# Patient Record
Sex: Female | Born: 1935 | Race: Black or African American | Hispanic: No | Smoking: Current some day smoker
Health system: Southern US, Community
[De-identification: ages and names within clinical notes are randomized; demographics above are authoritative.]

## PROBLEM LIST (undated history)

## (undated) DIAGNOSIS — R002 Palpitations: Secondary | ICD-10-CM

## (undated) DIAGNOSIS — I209 Angina pectoris, unspecified: Secondary | ICD-10-CM

## (undated) DIAGNOSIS — I2510 Atherosclerotic heart disease of native coronary artery without angina pectoris: Secondary | ICD-10-CM

## (undated) DIAGNOSIS — I10 Essential (primary) hypertension: Secondary | ICD-10-CM

## (undated) DIAGNOSIS — Z972 Presence of dental prosthetic device (complete) (partial): Secondary | ICD-10-CM

## (undated) DIAGNOSIS — R0600 Dyspnea, unspecified: Secondary | ICD-10-CM

## (undated) DIAGNOSIS — H269 Unspecified cataract: Secondary | ICD-10-CM

## (undated) DIAGNOSIS — Z973 Presence of spectacles and contact lenses: Secondary | ICD-10-CM

## (undated) DIAGNOSIS — R918 Other nonspecific abnormal finding of lung field: Secondary | ICD-10-CM

## (undated) DIAGNOSIS — C3490 Malignant neoplasm of unspecified part of unspecified bronchus or lung: Secondary | ICD-10-CM

## (undated) DIAGNOSIS — K219 Gastro-esophageal reflux disease without esophagitis: Secondary | ICD-10-CM

## (undated) DIAGNOSIS — J189 Pneumonia, unspecified organism: Secondary | ICD-10-CM

## (undated) DIAGNOSIS — I639 Cerebral infarction, unspecified: Secondary | ICD-10-CM

## (undated) DIAGNOSIS — J45909 Unspecified asthma, uncomplicated: Secondary | ICD-10-CM

## (undated) DIAGNOSIS — M1990 Unspecified osteoarthritis, unspecified site: Secondary | ICD-10-CM

## (undated) HISTORY — DX: Cerebral infarction, unspecified: I63.9

## (undated) HISTORY — PX: COLONOSCOPY W/ BIOPSIES AND POLYPECTOMY: SHX1376

## (undated) HISTORY — PX: MULTIPLE TOOTH EXTRACTIONS: SHX2053

## (undated) HISTORY — PX: ABDOMINAL HYSTERECTOMY: SHX81

## (undated) HISTORY — PX: ANKLE SURGERY: SHX546

## (undated) HISTORY — PX: TOTAL KNEE ARTHROPLASTY: SHX125

---

## 1997-12-27 ENCOUNTER — Encounter: Admission: RE | Admit: 1997-12-27 | Discharge: 1998-03-27 | Payer: Self-pay | Admitting: Orthopedic Surgery

## 1997-12-31 ENCOUNTER — Encounter (HOSPITAL_COMMUNITY): Admission: RE | Admit: 1997-12-31 | Discharge: 1998-03-31 | Payer: Self-pay | Admitting: Orthopedic Surgery

## 1998-04-02 ENCOUNTER — Encounter: Admission: RE | Admit: 1998-04-02 | Discharge: 1998-07-01 | Payer: Self-pay | Admitting: Orthopedic Surgery

## 1998-08-25 ENCOUNTER — Encounter: Payer: Self-pay | Admitting: Internal Medicine

## 1998-08-25 ENCOUNTER — Ambulatory Visit (HOSPITAL_COMMUNITY): Admission: RE | Admit: 1998-08-25 | Discharge: 1998-08-25 | Payer: Self-pay | Admitting: Internal Medicine

## 1999-06-08 ENCOUNTER — Inpatient Hospital Stay (HOSPITAL_COMMUNITY): Admission: AD | Admit: 1999-06-08 | Discharge: 1999-06-12 | Payer: Self-pay | Admitting: Cardiovascular Disease

## 1999-06-09 ENCOUNTER — Encounter: Payer: Self-pay | Admitting: Cardiovascular Disease

## 1999-06-10 ENCOUNTER — Encounter: Payer: Self-pay | Admitting: Cardiovascular Disease

## 1999-06-11 ENCOUNTER — Encounter: Payer: Self-pay | Admitting: Cardiovascular Disease

## 2000-01-05 ENCOUNTER — Other Ambulatory Visit: Admission: RE | Admit: 2000-01-05 | Discharge: 2000-01-05 | Payer: Self-pay | Admitting: Obstetrics and Gynecology

## 2000-01-15 ENCOUNTER — Encounter: Payer: Self-pay | Admitting: Internal Medicine

## 2000-01-15 ENCOUNTER — Ambulatory Visit (HOSPITAL_COMMUNITY): Admission: RE | Admit: 2000-01-15 | Discharge: 2000-01-15 | Payer: Self-pay | Admitting: Internal Medicine

## 2000-03-15 ENCOUNTER — Ambulatory Visit (HOSPITAL_COMMUNITY): Admission: RE | Admit: 2000-03-15 | Discharge: 2000-03-15 | Payer: Self-pay | Admitting: Gastroenterology

## 2000-03-15 ENCOUNTER — Encounter: Payer: Self-pay | Admitting: Gastroenterology

## 2000-04-30 ENCOUNTER — Encounter: Payer: Self-pay | Admitting: Emergency Medicine

## 2000-05-01 ENCOUNTER — Inpatient Hospital Stay (HOSPITAL_COMMUNITY): Admission: EM | Admit: 2000-05-01 | Discharge: 2000-05-03 | Payer: Self-pay | Admitting: Emergency Medicine

## 2000-05-02 ENCOUNTER — Encounter: Payer: Self-pay | Admitting: Cardiology

## 2000-08-30 ENCOUNTER — Inpatient Hospital Stay (HOSPITAL_COMMUNITY): Admission: AD | Admit: 2000-08-30 | Discharge: 2000-09-05 | Payer: Self-pay | Admitting: Internal Medicine

## 2000-08-30 ENCOUNTER — Encounter: Payer: Self-pay | Admitting: Internal Medicine

## 2000-12-19 ENCOUNTER — Encounter (INDEPENDENT_AMBULATORY_CARE_PROVIDER_SITE_OTHER): Payer: Self-pay | Admitting: Specialist

## 2000-12-19 ENCOUNTER — Ambulatory Visit (HOSPITAL_COMMUNITY): Admission: RE | Admit: 2000-12-19 | Discharge: 2000-12-19 | Payer: Self-pay | Admitting: Gastroenterology

## 2001-02-03 ENCOUNTER — Emergency Department (HOSPITAL_COMMUNITY): Admission: EM | Admit: 2001-02-03 | Discharge: 2001-02-03 | Payer: Self-pay | Admitting: Emergency Medicine

## 2001-02-03 ENCOUNTER — Encounter: Payer: Self-pay | Admitting: Emergency Medicine

## 2001-03-24 ENCOUNTER — Encounter: Payer: Self-pay | Admitting: Cardiovascular Disease

## 2001-03-24 ENCOUNTER — Encounter: Admission: RE | Admit: 2001-03-24 | Discharge: 2001-03-24 | Payer: Self-pay | Admitting: Cardiovascular Disease

## 2002-07-02 ENCOUNTER — Encounter: Payer: Self-pay | Admitting: Nephrology

## 2002-07-02 ENCOUNTER — Encounter: Admission: RE | Admit: 2002-07-02 | Discharge: 2002-07-02 | Payer: Self-pay | Admitting: Nephrology

## 2002-07-30 ENCOUNTER — Encounter: Admission: RE | Admit: 2002-07-30 | Discharge: 2002-10-28 | Payer: Self-pay | Admitting: Internal Medicine

## 2002-11-18 ENCOUNTER — Emergency Department (HOSPITAL_COMMUNITY): Admission: EM | Admit: 2002-11-18 | Discharge: 2002-11-19 | Payer: Self-pay | Admitting: Emergency Medicine

## 2002-12-13 ENCOUNTER — Ambulatory Visit (HOSPITAL_COMMUNITY): Admission: RE | Admit: 2002-12-13 | Discharge: 2002-12-13 | Payer: Self-pay | Admitting: Cardiovascular Disease

## 2002-12-13 ENCOUNTER — Encounter: Payer: Self-pay | Admitting: Cardiovascular Disease

## 2002-12-17 ENCOUNTER — Encounter: Admission: RE | Admit: 2002-12-17 | Discharge: 2003-03-17 | Payer: Self-pay | Admitting: Internal Medicine

## 2003-03-26 ENCOUNTER — Encounter: Admission: RE | Admit: 2003-03-26 | Discharge: 2003-06-24 | Payer: Self-pay | Admitting: Internal Medicine

## 2003-04-24 ENCOUNTER — Ambulatory Visit (HOSPITAL_COMMUNITY): Admission: RE | Admit: 2003-04-24 | Discharge: 2003-04-24 | Payer: Self-pay | Admitting: Cardiovascular Disease

## 2003-04-24 ENCOUNTER — Encounter: Payer: Self-pay | Admitting: Cardiovascular Disease

## 2003-06-10 ENCOUNTER — Ambulatory Visit (HOSPITAL_COMMUNITY): Admission: RE | Admit: 2003-06-10 | Discharge: 2003-06-10 | Payer: Self-pay | Admitting: Cardiovascular Disease

## 2003-10-15 ENCOUNTER — Ambulatory Visit (HOSPITAL_COMMUNITY): Admission: RE | Admit: 2003-10-15 | Discharge: 2003-10-15 | Payer: Self-pay | Admitting: Cardiovascular Disease

## 2005-04-05 ENCOUNTER — Inpatient Hospital Stay (HOSPITAL_COMMUNITY): Admission: EM | Admit: 2005-04-05 | Discharge: 2005-04-09 | Payer: Self-pay | Admitting: *Deleted

## 2005-05-19 ENCOUNTER — Ambulatory Visit (HOSPITAL_COMMUNITY): Admission: RE | Admit: 2005-05-19 | Discharge: 2005-05-19 | Payer: Self-pay | Admitting: Cardiovascular Disease

## 2005-05-31 ENCOUNTER — Ambulatory Visit (HOSPITAL_COMMUNITY): Admission: RE | Admit: 2005-05-31 | Discharge: 2005-05-31 | Payer: Self-pay | Admitting: Orthopaedic Surgery

## 2005-07-06 ENCOUNTER — Emergency Department (HOSPITAL_COMMUNITY): Admission: EM | Admit: 2005-07-06 | Discharge: 2005-07-06 | Payer: Self-pay | Admitting: Emergency Medicine

## 2005-07-08 ENCOUNTER — Emergency Department (HOSPITAL_COMMUNITY): Admission: EM | Admit: 2005-07-08 | Discharge: 2005-07-08 | Payer: Self-pay | Admitting: Emergency Medicine

## 2005-09-14 ENCOUNTER — Encounter: Admission: RE | Admit: 2005-09-14 | Discharge: 2005-09-14 | Payer: Self-pay | Admitting: Orthopedic Surgery

## 2005-10-29 ENCOUNTER — Ambulatory Visit (HOSPITAL_COMMUNITY): Admission: RE | Admit: 2005-10-29 | Discharge: 2005-10-29 | Payer: Self-pay | Admitting: Cardiovascular Disease

## 2006-03-27 ENCOUNTER — Emergency Department (HOSPITAL_COMMUNITY): Admission: EM | Admit: 2006-03-27 | Discharge: 2006-03-27 | Payer: Self-pay | Admitting: Family Medicine

## 2006-06-13 ENCOUNTER — Encounter: Admission: RE | Admit: 2006-06-13 | Discharge: 2006-06-13 | Payer: Self-pay | Admitting: Internal Medicine

## 2006-07-07 ENCOUNTER — Ambulatory Visit (HOSPITAL_COMMUNITY): Admission: RE | Admit: 2006-07-07 | Discharge: 2006-07-07 | Payer: Self-pay | Admitting: Cardiology

## 2006-07-12 ENCOUNTER — Inpatient Hospital Stay (HOSPITAL_COMMUNITY): Admission: EM | Admit: 2006-07-12 | Discharge: 2006-07-13 | Payer: Self-pay | Admitting: Cardiovascular Disease

## 2006-09-29 ENCOUNTER — Ambulatory Visit (HOSPITAL_COMMUNITY): Admission: RE | Admit: 2006-09-29 | Discharge: 2006-09-29 | Payer: Self-pay | Admitting: Cardiovascular Disease

## 2006-12-19 ENCOUNTER — Emergency Department (HOSPITAL_COMMUNITY): Admission: EM | Admit: 2006-12-19 | Discharge: 2006-12-19 | Payer: Self-pay | Admitting: Emergency Medicine

## 2007-03-09 ENCOUNTER — Inpatient Hospital Stay (HOSPITAL_COMMUNITY): Admission: EM | Admit: 2007-03-09 | Discharge: 2007-03-12 | Payer: Self-pay | Admitting: Emergency Medicine

## 2007-04-25 ENCOUNTER — Encounter: Admission: RE | Admit: 2007-04-25 | Discharge: 2007-04-25 | Payer: Self-pay | Admitting: Gastroenterology

## 2007-11-05 ENCOUNTER — Emergency Department (HOSPITAL_COMMUNITY): Admission: EM | Admit: 2007-11-05 | Discharge: 2007-11-05 | Payer: Self-pay | Admitting: Emergency Medicine

## 2007-11-22 ENCOUNTER — Encounter: Admission: RE | Admit: 2007-11-22 | Discharge: 2007-11-22 | Payer: Self-pay | Admitting: Internal Medicine

## 2008-04-26 ENCOUNTER — Encounter: Admission: RE | Admit: 2008-04-26 | Discharge: 2008-07-01 | Payer: Self-pay | Admitting: Gastroenterology

## 2008-06-17 ENCOUNTER — Emergency Department (HOSPITAL_COMMUNITY): Admission: EM | Admit: 2008-06-17 | Discharge: 2008-06-17 | Payer: Self-pay | Admitting: Family Medicine

## 2008-09-18 ENCOUNTER — Inpatient Hospital Stay (HOSPITAL_COMMUNITY): Admission: EM | Admit: 2008-09-18 | Discharge: 2008-09-21 | Payer: Self-pay | Admitting: Emergency Medicine

## 2008-09-26 ENCOUNTER — Inpatient Hospital Stay (HOSPITAL_COMMUNITY): Admission: EM | Admit: 2008-09-26 | Discharge: 2008-09-29 | Payer: Self-pay | Admitting: Emergency Medicine

## 2009-02-26 ENCOUNTER — Ambulatory Visit (HOSPITAL_COMMUNITY): Admission: RE | Admit: 2009-02-26 | Discharge: 2009-02-26 | Payer: Self-pay

## 2009-05-07 ENCOUNTER — Emergency Department (HOSPITAL_COMMUNITY): Admission: EM | Admit: 2009-05-07 | Discharge: 2009-05-07 | Payer: Self-pay | Admitting: Emergency Medicine

## 2009-05-12 ENCOUNTER — Emergency Department (HOSPITAL_COMMUNITY): Admission: EM | Admit: 2009-05-12 | Discharge: 2009-05-12 | Payer: Self-pay | Admitting: Family Medicine

## 2010-05-02 ENCOUNTER — Emergency Department (HOSPITAL_COMMUNITY)
Admission: EM | Admit: 2010-05-02 | Discharge: 2010-05-02 | Payer: Self-pay | Source: Home / Self Care | Admitting: Family Medicine

## 2010-08-07 ENCOUNTER — Emergency Department (HOSPITAL_COMMUNITY)
Admission: EM | Admit: 2010-08-07 | Discharge: 2010-08-07 | Payer: Self-pay | Source: Home / Self Care | Admitting: Family Medicine

## 2010-08-24 ENCOUNTER — Encounter: Payer: Self-pay | Admitting: Gastroenterology

## 2010-09-02 ENCOUNTER — Other Ambulatory Visit: Payer: Self-pay | Admitting: Internal Medicine

## 2010-09-02 ENCOUNTER — Ambulatory Visit
Admission: RE | Admit: 2010-09-02 | Discharge: 2010-09-02 | Disposition: A | Discharge status: Home / Self Care | Payer: MEDICARE | Source: Ambulatory Visit | Attending: Internal Medicine | Admitting: Internal Medicine

## 2010-09-02 DIAGNOSIS — F17200 Nicotine dependence, unspecified, uncomplicated: Secondary | ICD-10-CM

## 2010-09-02 DIAGNOSIS — R05 Cough: Secondary | ICD-10-CM

## 2010-11-17 LAB — CULTURE, BLOOD (ROUTINE X 2)

## 2010-11-17 LAB — CBC
HCT: 31.4 % — ABNORMAL LOW (ref 36.0–46.0)
HCT: 35.1 % — ABNORMAL LOW (ref 36.0–46.0)
HCT: 41.5 % (ref 36.0–46.0)
HCT: 42.4 % (ref 36.0–46.0)
Hemoglobin: 11.4 g/dL — ABNORMAL LOW (ref 12.0–15.0)
Hemoglobin: 11.9 g/dL — ABNORMAL LOW (ref 12.0–15.0)
Hemoglobin: 13.8 g/dL (ref 12.0–15.0)
Hemoglobin: 13.9 g/dL (ref 12.0–15.0)
MCHC: 32.8 g/dL (ref 30.0–36.0)
MCHC: 33.3 g/dL (ref 30.0–36.0)
MCHC: 33.5 g/dL (ref 30.0–36.0)
MCHC: 33.8 g/dL (ref 30.0–36.0)
MCHC: 34.1 g/dL (ref 30.0–36.0)
MCV: 82.2 fL (ref 78.0–100.0)
MCV: 82.9 fL (ref 78.0–100.0)
Platelets: 216 10*3/uL (ref 150–400)
Platelets: 259 10*3/uL (ref 150–400)
Platelets: 340 10*3/uL (ref 150–400)
RBC: 4.18 MIL/uL (ref 3.87–5.11)
RBC: 4.33 MIL/uL (ref 3.87–5.11)
RBC: 5.05 MIL/uL (ref 3.87–5.11)
RDW: 14.3 % (ref 11.5–15.5)
RDW: 14.4 % (ref 11.5–15.5)
RDW: 14.5 % (ref 11.5–15.5)
WBC: 14.6 10*3/uL — ABNORMAL HIGH (ref 4.0–10.5)
WBC: 5 10*3/uL (ref 4.0–10.5)
WBC: 6.3 10*3/uL (ref 4.0–10.5)

## 2010-11-17 LAB — URINALYSIS, ROUTINE W REFLEX MICROSCOPIC
Glucose, UA: NEGATIVE mg/dL
Hgb urine dipstick: NEGATIVE
Ketones, ur: NEGATIVE mg/dL
Nitrite: POSITIVE — AB
Protein, ur: 30 mg/dL — AB
Specific Gravity, Urine: 1.023 (ref 1.005–1.030)
Specific Gravity, Urine: 1.025 (ref 1.005–1.030)
Urobilinogen, UA: 0.2 mg/dL (ref 0.0–1.0)
Urobilinogen, UA: 0.2 mg/dL (ref 0.0–1.0)
pH: 5 (ref 5.0–8.0)

## 2010-11-17 LAB — BLOOD GAS, ARTERIAL
Acid-base deficit: 4.1 mmol/L — ABNORMAL HIGH (ref 0.0–2.0)
Drawn by: 23588
FIO2: 0.21 %
pCO2 arterial: 33.1 mmHg — ABNORMAL LOW (ref 35.0–45.0)
pH, Arterial: 7.396 (ref 7.350–7.400)
pO2, Arterial: 59.5 mmHg — ABNORMAL LOW (ref 80.0–100.0)

## 2010-11-17 LAB — COMPREHENSIVE METABOLIC PANEL
ALT: 10 U/L (ref 0–35)
AST: 9 U/L (ref 0–37)
Albumin: 2.9 g/dL — ABNORMAL LOW (ref 3.5–5.2)
Albumin: 3.1 g/dL — ABNORMAL LOW (ref 3.5–5.2)
Albumin: 4 g/dL (ref 3.5–5.2)
Alkaline Phosphatase: 42 U/L (ref 39–117)
Alkaline Phosphatase: 56 U/L (ref 39–117)
BUN: 15 mg/dL (ref 6–23)
BUN: 16 mg/dL (ref 6–23)
BUN: 22 mg/dL (ref 6–23)
BUN: 28 mg/dL — ABNORMAL HIGH (ref 6–23)
CO2: 17 mEq/L — ABNORMAL LOW (ref 19–32)
Calcium: 9 mg/dL (ref 8.4–10.5)
Chloride: 111 mEq/L (ref 96–112)
Creatinine, Ser: 1.14 mg/dL (ref 0.4–1.2)
Creatinine, Ser: 1.29 mg/dL — ABNORMAL HIGH (ref 0.4–1.2)
Creatinine, Ser: 1.3 mg/dL — ABNORMAL HIGH (ref 0.4–1.2)
GFR calc non Af Amer: 47 mL/min — ABNORMAL LOW (ref >60)
Glucose, Bld: 163 mg/dL — ABNORMAL HIGH (ref 70–99)
Glucose, Bld: 207 mg/dL — ABNORMAL HIGH (ref 70–99)
Glucose, Bld: 88 mg/dL (ref 70–99)
Potassium: 4 mEq/L (ref 3.5–5.1)
Sodium: 138 mEq/L (ref 135–145)
Total Bilirubin: 0.3 mg/dL (ref 0.3–1.2)
Total Bilirubin: 0.5 mg/dL (ref 0.3–1.2)
Total Bilirubin: 0.5 mg/dL (ref 0.3–1.2)
Total Protein: 5.6 g/dL — ABNORMAL LOW (ref 6.0–8.3)
Total Protein: 5.9 g/dL — ABNORMAL LOW (ref 6.0–8.3)
Total Protein: 7.4 g/dL (ref 6.0–8.3)

## 2010-11-17 LAB — URINE CULTURE

## 2010-11-17 LAB — BASIC METABOLIC PANEL
BUN: 8 mg/dL (ref 6–23)
CO2: 19 mEq/L (ref 19–32)
CO2: 20 mEq/L (ref 19–32)
CO2: 20 mEq/L (ref 19–32)
Calcium: 7.7 mg/dL — ABNORMAL LOW (ref 8.4–10.5)
Calcium: 8.2 mg/dL — ABNORMAL LOW (ref 8.4–10.5)
Creatinine, Ser: 0.86 mg/dL (ref 0.4–1.2)
Creatinine, Ser: 1.11 mg/dL (ref 0.4–1.2)
GFR calc Af Amer: 59 mL/min — ABNORMAL LOW (ref >60)
GFR calc Af Amer: >60 mL/min (ref >60)
GFR calc non Af Amer: 48 mL/min — ABNORMAL LOW (ref >60)
GFR calc non Af Amer: >60 mL/min (ref >60)
GFR calc non Af Amer: >60 mL/min (ref >60)
Glucose, Bld: 104 mg/dL — ABNORMAL HIGH (ref 70–99)
Potassium: 4.4 mEq/L (ref 3.5–5.1)
Sodium: 137 mEq/L (ref 135–145)
Sodium: 141 mEq/L (ref 135–145)

## 2010-11-17 LAB — GLUCOSE, CAPILLARY
Glucose-Capillary: 101 mg/dL — ABNORMAL HIGH (ref 70–99)
Glucose-Capillary: 103 mg/dL — ABNORMAL HIGH (ref 70–99)
Glucose-Capillary: 107 mg/dL — ABNORMAL HIGH (ref 70–99)
Glucose-Capillary: 109 mg/dL — ABNORMAL HIGH (ref 70–99)
Glucose-Capillary: 112 mg/dL — ABNORMAL HIGH (ref 70–99)
Glucose-Capillary: 113 mg/dL — ABNORMAL HIGH (ref 70–99)
Glucose-Capillary: 119 mg/dL — ABNORMAL HIGH (ref 70–99)
Glucose-Capillary: 127 mg/dL — ABNORMAL HIGH (ref 70–99)
Glucose-Capillary: 132 mg/dL — ABNORMAL HIGH (ref 70–99)
Glucose-Capillary: 77 mg/dL (ref 70–99)
Glucose-Capillary: 91 mg/dL (ref 70–99)
Glucose-Capillary: 92 mg/dL (ref 70–99)
Glucose-Capillary: 95 mg/dL (ref 70–99)

## 2010-11-17 LAB — DIFFERENTIAL
Basophils Absolute: 0 10*3/uL (ref 0.0–0.1)
Basophils Absolute: 0 10*3/uL (ref 0.0–0.1)
Basophils Relative: 0 % (ref 0–1)
Lymphocytes Relative: 3 % — ABNORMAL LOW (ref 12–46)
Lymphocytes Relative: 8 % — ABNORMAL LOW (ref 12–46)
Lymphs Abs: 1.2 10*3/uL (ref 0.7–4.0)
Monocytes Absolute: 0.6 10*3/uL (ref 0.1–1.0)
Monocytes Relative: 4 % (ref 3–12)
Neutro Abs: 11.9 10*3/uL — ABNORMAL HIGH (ref 1.7–7.7)
Neutrophils Relative %: 91 % — ABNORMAL HIGH (ref 43–77)
Neutrophils Relative %: 91 % — ABNORMAL HIGH (ref 43–77)

## 2010-11-17 LAB — LIPASE, BLOOD
Lipase: 1006 U/L — ABNORMAL HIGH (ref 11–59)
Lipase: 27 U/L (ref 11–59)
Lipase: 78 U/L — ABNORMAL HIGH (ref 11–59)
Lipase: 83 U/L — ABNORMAL HIGH (ref 11–59)

## 2010-11-17 LAB — URINE MICROSCOPIC-ADD ON

## 2010-11-17 LAB — LIPID PANEL
HDL: 25 mg/dL — ABNORMAL LOW (ref >39)
HDL: 26 mg/dL — ABNORMAL LOW (ref >39)
Total CHOL/HDL Ratio: 5.4 RATIO
Total CHOL/HDL Ratio: 5.6 RATIO
VLDL: 25 mg/dL (ref 0–40)

## 2010-11-17 LAB — HEMOGLOBIN A1C
Hgb A1c MFr Bld: 6.9 % — ABNORMAL HIGH (ref 4.6–6.1)
Mean Plasma Glucose: 151 mg/dL

## 2010-12-15 NOTE — Discharge Summary (Signed)
NAME:  [name] [name], [name] [name]               ACCOUNT NO.:  [id number]   MEDICAL RECORD NO.:  [id number]          PATIENT TYPE:  INP   LOCATION:  5506                         FACILITY:  [facility]   PHYSICIAN:  Hillery Aldo, M.D.   DATE OF BIRTH:  02/24/1935   DATE OF ADMISSION:  03/09/2007  DATE OF DISCHARGE:  03/12/2007                               DISCHARGE SUMMARY   PRIMARY CARE PHYSICIAN:  Dr. Barbee Shropshire   DISCHARGE DIAGNOSES:  1. Recurrent acute pancreatitis, likely secondary to statin therapy or      hydrochlorothiazide.  2. Escherichia coli urinary tract infection.  3. Diabetes.  4. Hypertension.  5. Coronary artery disease.  6. Dyslipidemia.  7. Obesity.  8. Gastroesophageal reflux disease.  9. Ongoing tobacco abuse.  10.Acute renal insufficiency, resolved.   DISCHARGE MEDICATIONS:  1. AcipHex 40 mg daily.  2. Glucotrol 5 mg daily.  3. Clonidine 0.1 mg daily.  4. Glucophage 500 mg daily.  5. Aspirin 81 mg daily.  6. Cipro 250 mg b.i.d. through March 03, 2007.  7. Toprol-XL 25 mg daily.   CONSULTATIONS:  None.   PROCEDURES AND DIAGNOSTIC STUDIES:  CT scan of the abdomen and pelvis on  March 09, 2007, showed no acute abnormalities in the abdomen or the  pelvis.   DISCHARGE LABORATORY VALUES:  Sodium was 142, potassium 3.5, chloride  112, bicarb 24, BUN 13, creatinine 0.98, glucose 107.  White blood cell  count was 7.4, hemoglobin 11.6, hematocrit 34.6, platelets 272.  Lipase  was 19.   BRIEF ADMISSION HPI:  The patient is a 76 year old female who presented  to the emergency department on March 09, 2007, with continuous nausea  and intermittent vomiting.  The patient had stated that her symptoms  were similar to a previous bout of pancreatitis that she suffered  approximately 1 year prior to this admission.  It was felt that the  patient's pancreatitis at that time was due to possible adverse effect  of hydrochlorothiazide or Zocor.  The patient reports that 1 day prior  to  the onset of her symptoms, she resumed taking Zocor.  It is likely  that this is a statin-induced pancreatitis and the patient was admitted  for symptomatic treatment.   HOSPITAL COURSE BY PROBLEM:  #1 - NAUSEA AND VOMITING SECONDARY TO ACUTE  PANCREATITIS.  The patient's admission lipase was elevated at 674.  CT  scan of the abdomen and pelvis did not reveal any pseudocyst or other  abnormality.  With conservative therapy including bowel rest and  antiemetic treatment as well as pain medicines, the patient's  pancreatitis rapidly resolved.  Her lipase normalized by March 11, 2007,  and her diet was gradually advanced.  At this point, the patient is  stable for discharge home and is instructed not to take Zocor any more.   #2 - DIABETES.  The patient was initially put on sliding scale insulin  due to her n.p.o. status.  Prior to discharge, her oral hypoglycemic  medications were resumed.  She should follow up with her primary care  physician regarding ongoing management of her diabetes, which  is  suboptimally controlled given her hemoglobin A1c of 8.3.   #3 - ESCHERICHIA COLI URINARY TRACT INFECTION.  The patient was  empirically treated with Cipro.  She had a leukocytosis which resolved  over the course of her hospitalization that was likely due to her  urinary tract infection.  Sensitivity data is pending, but given her  symptomatic improvement it is likely that the Escherichia coli is Cipro  sensitive.  She will complete a total course of treatment of 5 days.   #4 - HYPERTENSION.  The patient's blood pressure was controlled with a  combination of Norvasc and Toprol-XL.  We will discharge her on Toprol-  XL and her usual dose of clonidine.  Given her past medical history of  coronary disease, beta blocker certainly would be indicated in this  patient.  Further management and surveillance regarding control of her  blood pressure can be managed as an outpatient problem.   #5 -  CORONARY ARTERY DISEASE.  The patient was started on aspirin  therapy and a beta blocker while in the hospital.  She should continue  these at discharge.   #6 - DYSLIPIDEMIA.  The patient did receive dietary counseling regarding  the importance of adhering to a low-fat diet.  A lipid profile was  checked and she had a total cholesterol of 195, triglyceride level of  110, HDL of 37, and an LDL of 136.  She is intolerant to statin therapy  and this should no longer be used in treatment of her hyperlipidemia.  Consideration for an alternative agent can be made by her outpatient  physician.   #7 - OBESITY.  The patient was counseled by the dietician regarding  weight loss efforts.   #8 - GASTROESOPHAGEAL REFLUX DISEASE.  The patient was continued on  proton pump inhibitor therapy.   #9 - TOBACCO ABUSE.  The patient was counseled on the importance of  cessation.   #10 - ACUTE RENAL INSUFFICIENCY.  The patient's admission creatinine was  elevated at 1.3.  With hydration, this declined to a value of 0.98.  The  bump in her creatinine was likely due to prerenal etiology due to her  nausea and vomiting.   DISPOSITION:  The patient is stable for discharge home.  She will be  discharged later today as long as she tolerates further advancement of  her diet.     Hillery Aldo, M.D.  Electronically Signed    CR/MEDQ  D:  03/12/2007  T:  03/13/2007  Job:  [id number]   cc:   Olene Craven, M.D.

## 2010-12-15 NOTE — H&P (Signed)
NAME:  [name] [name], [name] [name]               ACCOUNT NO.:  [id number]   MEDICAL RECORD NO.:  [id number]          PATIENT TYPE:  INP   LOCATION:  5506                         FACILITY:  [facility]   PHYSICIAN:  Beckey Rutter, MD  DATE OF BIRTH:  [birth year]   DATE OF ADMISSION:  03/09/2007  DATE OF DISCHARGE:                              HISTORY & PHYSICAL   This is Dr. are not dictated H&P for the patient 32, magnesium medical  number [id number] female.  Date of birth Oct 21, 1934.   PRIMARY CARE PHYSICIAN:  Dr. Barbee Shropshire.   CHIEF COMPLAINT:  Nausea and vomiting   HISTORY OF PRESENT ILLNESS:  This is a 76 year old female with past  medical history significant for coronary artery disease, hypertension,  diabetes type 2, obesity and history of pancreatitis presented today  with vomiting that started this afternoon and was continuously  progressive associated with nausea.  By the time the patient presented  herself to the emergency department here, she started to have diarrhea.  Also, she was complaining of mild chills.  The patient described these  symptoms as similar to when she had pancreatitis more than a year ago.  The patient stated now she is feeling better after she received an  injection for nausea.   PAST MEDICAL HISTORY:  1. Acute pancreatitis felt secondary to hydrochlorothiazide or Zocor.  2. History of urinary tract infection.  3. History of coronary artery disease.  4. Diabetes type 2.  5. Hypertension.  6. Obesity. tobacco abuse.   SOCIAL HISTORY:  The patient is a smoker but denied ethanol abuse or  illicit drug use.   FAMILY HISTORY:  Noncontributory   ALLERGIES:  CODEINE AND PENICILLIN.   MEDICATIONS:  1. Benicar.  2. Glipizide.  3. Glucotrol.  4. Lisinopril.  5. Toprol XL  6. Norvasc.   REVIEW OF SYSTEMS:  As per HPI.  VITAL SIGNS:  Temperature 98.8, blood pressure 142.76, pulse 94,  respiratory rate 20.  GENERAL:  The patient was lying flat in bed, not in  acute distress.  HEENT:  Head atraumatic, normocephalic.  Eyes: PERRL.  Mouth:  Moist.  No ulcer.  NECK:  Supple.  No JVD.  LUNGS:  Bilateral fair air entry, precordium first second heart sounds  audible.  ABDOMEN:  No tenderness.  Bowel sounds distant and sounds sluggish.  EXTREMITIES:  No lower extremity edema.   LABORATORY AND X-RAY DATA:  Microscopic urine showing many bacteria.  UA  showing cloudy urine with positive nitrate and moderate leukocyte  esterase.  White blood count 19.5, hemoglobin 13.8, hematocrit 41.7,  platelets count noted and the count is not done on this assessment.  Lipase level is 674.  Sodium 141, potassium 4.2, chloride 111, CO2 118,  glucose 117, BUN 16, creatinine 1.07.  estimated GFR more than 60.  CK-  MB less than 1.0.  The abdomen CT scan is pending.   ASSESSMENT/PLAN:  This is 76 year old with nausea and vomiting likely  secondary to pancreatitis especially with elevated lipase.  The plan is  to admit the patient to the medical floor for further  assessment and  management.  We will obtain CT scan with p.o. contrast at this time.  We  will keep the patient n.p.o. pending the CAT scan as well as the amylase  level.  Will keep the patient hydrated with gentle hydration for now.  For her diabetes, the patient will have a sliding scale without coverage  now, and I am also going to stop her oral hypoglycemic medication.  For blood pressure medication, will hold to the medication today, but  will consider starting the patient on lisinopril, Toprol XL and Norvasc  in the morning.  For GI prophylaxis, will start Protonix.  For DVT prophylaxis will  consider sequential pneumatic device for now to be switched to Lovenox  after the CT scan results.      Beckey Rutter, MD  Electronically Signed     EME/MEDQ  D:  03/09/2007  T:  03/10/2007  Job:  [id number]

## 2010-12-15 NOTE — Discharge Summary (Signed)
NAME:  [name] [name], [name] [name]               ACCOUNT NO.:  [id number]   MEDICAL RECORD NO.:  [id number]          PATIENT TYPE:  INP   LOCATION:  5504                         FACILITY:  [facility]   PHYSICIAN:  Altha Harm, MDDATE OF BIRTH:  [birth year]   DATE OF ADMISSION:  09/26/2008  DATE OF DISCHARGE:  09/29/2008                               DISCHARGE SUMMARY   ADDENDUM:   DISCHARGE DISPOSITION:  Home.  Please refer to the discharge summary  dictated by Dr. Glade Lloyd yesterday, September 28, 2008  for hospital course  up until that point.   DISCHARGE DIAGNOSES:  Please add to discharge diagnoses:  E-coli urinary tract infection fully treated.   DISCHARGE MEDICATIONS:  No additions to her discharge medications except  for Pancrease 1 tablet p.o. t.i.d. a.c. meals.   HOSPITAL COURSE:  The patient's discharge was held yesterday as she did  not have an evening meal and her tolerance to her meal could not be  adequately assessed.  This morning the patient had her breakfast and  lunch which she tolerated without any difficulty.   In terms of her urinary tract infection, the patient had been started on  ciprofloxacin on admission.  The cultures grew E-coli which was  sensitive to ciprofloxacin and the patient has completed 4 days of  antibiotics for her UTI.  She will not be discharged on any further  antibiotics for the UTI.   Otherwise the patient is stable.  The plan is to be discharged home and  follow up with Dr. Renae Gloss in 1 week.      Altha Harm, MD  Electronically Signed     MAM/MEDQ  D:  09/29/2008  T:  09/29/2008  Job:  [id number]

## 2010-12-15 NOTE — Discharge Summary (Signed)
NAME:  [name] [name], [name] [name]               ACCOUNT NO.:  [id number]   MEDICAL RECORD NO.:  [id number]          PATIENT TYPE:  INP   LOCATION:  5504                         FACILITY:  [facility]   PHYSICIAN:  Theodosia Paling, MD    DATE OF BIRTH:  1935/06/29   DATE OF ADMISSION:  09/26/2008  DATE OF DISCHARGE:                               DISCHARGE SUMMARY   PRIMARY CARE PHYSICIAN:  Merlene Laughter. Renae Gloss, MD   ADMITTING HISTORY:  Please refer to the admission note dictated by Dr.  Toniann Fail for the history of present illness for admission data.   DISCHARGE DIAGNOSES:  1. Viral gastroenteritis.  2. History of hypertension.  3. History of type 2 diabetes mellitus.   DISCHARGE MEDICATIONS:  1. Benicar 10 mg p.o. daily.  2. Glipizide 10 mg p.o. daily.  3. Toprol-XL 25 mg daily.  4. Norvasc 10 mg p.o. daily.  5. Omeprazole 40 mg p.o. q.12 h.  6. Aspirin, enteric coated 81 mg p.o. daily.  7. Lisinopril 10 mg p.o. daily.   HOSPITAL COURSE:  Following issues were addressed during the  hospitalization.  1. Nausea and vomiting.  The patient had a recent discharge from the      hospital on September 21, 2008.  She returns back with nausea and      vomiting.  She has a history of recurrent pancreatitis, obtained a      CT scan with IV contrast, which was negative for pancreatitis.  The      patient's symptoms resolved on its own.  Most likely the cause is      viral gastroenteritis.  The patient will be following up with the      primary care physician for further evaluation.  The patient does      not have a history of alcohol abuse or evidence of CT scan and her      triglycerides have been Normal in the past.  2. Hypertension.  Her hypertension stayed stable on home medication.  3. Diabetes.  History of diabetes or diabetic state, stable on home      medications.   DISPOSITION:  The patient will be following up with her primary care  physician within 1 week time.   CONSULTATION PERFORMED:   None.   PROCEDURE PERFORMED:  None.   IMAGING PERFORMED:  1. CT scan of the abdomen and pelvis with IV contrast showing no CT      findings to suggest pancreatitis.  2. Status post cholecystectomy with chronic biliary dilation.  3. Stable atherosclerotic disease involving aorta diverticulosis of      sigmoid colon.   Thank you so much for this dictation.   Total time spent in discharge of this patient around 40 minutes.      Theodosia Paling, MD  Electronically Signed     NP/MEDQ  D:  09/28/2008  T:  09/28/2008  Job:  [id number]   cc:   Merlene Laughter. Renae Gloss, M.D.

## 2010-12-15 NOTE — H&P (Signed)
NAME:  [name] [name], [name] [name]               ACCOUNT NO.:  [id number]   MEDICAL RECORD NO.:  [id number]          PATIENT TYPE:  INP   LOCATION:  1832                         FACILITY:  [facility]   PHYSICIAN:  Lonia Blood, M.D.DATE OF BIRTH:  01-Sep-1934   DATE OF ADMISSION:  09/18/2008  DATE OF DISCHARGE:                              HISTORY & PHYSICAL   PRIMARY CARE PHYSICIAN:  Dr. Kellie Shropshire.   GI PHYSICIAN:  Dr. Vida Rigger.   CHIEF COMPLAINT:  Intractable vomiting.   HISTORY OF PRESENT ILLNESS:  [name] [name] is a very pleasant 76-  year-old female with a longstanding history of recurrent pancreatitis.  The exact etiology of her pancreatitis is not clear.  It was originally  hypothesized that her pancreatitis was due to either hydrochlorothiazide  or Zocor, but she has not taken these medications for years and  continues to have recurrent bouts of acute pancreatitis.  She does not  drink alcohol.  She states that she had a bout of pancreatitis marked by  severe intractable nausea and vomiting approximately 2 weeks ago.  She  called Dr. Ewing Schlein and he called her in a prescription for nausea  medication.  She took this medication and was able to manage her  symptoms until her symptoms resolved.  Today at noon, her symptoms  recurred.  She began to experience severe unrelenting nausea with back-  to-back innumerable episodes of vomiting.  She used her anti nausea  medication but it did not help at all.  She therefore presented to the  emergency room.  She has continued to vomit in the emergency room.  There has been no hematemesis, hematochezia or melena.  There is  epigastric tenderness and pain, which is described as sharp, stabbing,  and crampy as well.  There has been no significant diarrhea.  There has  been no chest pain or shortness of breath.   REVIEW OF SYSTEMS:  The patient states that she has been in her usual  state of health until noon today.  A comprehensive review of  systems is  unremarkable with exception to the positive elements noted in the  history of present illness above.   PAST MEDICAL HISTORY:  1. Recurrent idiopathic pancreatitis.  2. Diabetes mellitus.  3. Hypertension.  4. Coronary artery disease via cardiac cath in 2007 with no focal high-      grade stenosis.  5. Hyperlipidemia.  6. Obesity.  7. Gastroesophageal reflux disease.  8. Tobacco abuse - ongoing.  9. Status post right total knee replacement in 1998.   OUTPATIENT MEDICATIONS:  1. Benicar 10 mg daily.  2. Glipizide 10 mg daily.  3. Phenergan 25 mg p.r.n.  4. Lisinopril 10 mg daily.  5. Metoprolol XL 100 mg daily.  6. Norvasc 10 mg daily.  7. Omeprazole 40 mg b.i.d.  8. Metformin 1000 mg b.i.d.  9. Aspirin 81 mg daily.   ALLERGIES:  VICODIN, CODEINE, PENICILLIN, ACTOS, AND ALTACE are all  listed as allergies but the specific reaction to each drug is not  detailed.   FAMILY HISTORY:  Noncontributory.  SOCIAL HISTORY:  The patient does not drink alcohol and she has never  been suspected of being a closet alcoholic.  She lives in [place].  She is retired.   DATA REVIEW:  White count is elevated at 14.7 with a normal hemoglobin,  normal platelet count, and normal MCV.  Sodium, potassium chloride, BUN  and creatinine are normal but bicarb is borderline low at 17 with a  serum glucose that is elevated at 163 and a normal calcium.  LFTs are  all normal.  Albumin is 4.1, lipase is 1006.  Acute abdominal series  reveals no acute disease.   PHYSICAL EXAMINATION:  VITAL SIGNS:  Temperature 97.0, blood pressure  130/67, heart rate 83, respiratory rate 16, O2 saturation 98% on room  air.  GENERAL:  Well-developed, well-nourished female in no acute respiratory  distress.  LUNGS:  Clear to auscultation bilaterally without wheezes or rhonchi.  CARDIOVASCULAR:  Regular rate and rhythm without gallop or rub.  ABDOMEN:  Tender in the epigastrium.  No rebound, no ascites and  no  appreciable mass.  No fluid wave.  Soft.  EXTREMITIES:  Trace bilateral lower extremity edema without cyanosis,  clubbing.  NEUROLOGIC:  Nonfocal neurologic exam.   IMPRESSION AND PLAN:  1. Recurrent acute idiopathic pancreatitis - We will restrict the      patient to very minimal clear liquids only for now.  If this      incites further nausea, we will make her fully n.p.o.  We will      administer antiemetics via IV on as needed basis.  We will assure      the patient receives adequate IV fluid hydration as she exhibits      significant volume loss in the ER along with recalcitrant vomiting.      If her symptoms do not improve in short course, we will add further      antiemetics.  If her course does is not one of improvement over the      next 24 hours, we will need to consider further evaluation with      possible MRCP or a CT scan or MRI of the abdomen to rule out a      complication of her recurrent pancreatitis.  2. Acidosis - this is likely a moderate lactic acidosis secondary to      metformin use in the setting of volume depletion and severe nausea.      For now we will simply follow this.  If it appears to worsen in the      morning, we will need to consider bicarbonate therapy.  We will, of      course, hold metformin until this issue is cleared up.  3. Hypertension - For now we will continue Toprol and Norvasc, but we      will hold Benicar lisinopril due to propensity to cause renal      insufficiency/renal failure in the setting of dehydration.  We will      follow her blood pressure trend closely.  4. Hyperlipidemia - We will not treat this at the present time.  We      will consider resuming treatment in the outpatient setting.  I will      check a fasting lipid panel in the morning to rule out severe      hypertriglyceridemia.  5. Tobacco abuse - I have counseled the patient on the advisability of      discontinuation of her tobacco abuse.  Lonia Blood, M.D.  Electronically Signed     JTM/MEDQ  D:  09/18/2008  T:  09/18/2008  Job:  [id number]   cc:   Merlene Laughter. Renae Gloss, M.D.  Petra Kuba, M.D.

## 2010-12-15 NOTE — Discharge Summary (Signed)
NAME:  [name] [name], [name] [name]               ACCOUNT NO.:  [id number]   MEDICAL RECORD NO.:  [id number]          PATIENT TYPE:  INP   LOCATION:  5124                         FACILITY:  [facility]   PHYSICIAN:  Michelene Gardener, MD    DATE OF BIRTH:  1935-02-26   DATE OF ADMISSION:  09/18/2008  DATE OF DISCHARGE:  09/21/2008                               DISCHARGE SUMMARY   DISCHARGE DIAGNOSES:  1. Acute on chronic pancreatitis.  2. Acidosis that resolved.  3. Hypertension.  4. Hyperlipidemia.  5. Tobacco abuse.  6. Diabetes mellitus.  7. History of coronary artery disease with no high-grade stenosis seen      on cath in 2007.  8. Obesity.  9. Gastroesophageal reflux disease.   DISCHARGE MEDICATIONS:  1. Benicar 10 mg p.o. once daily.  2. Glipizide 10 mg once a day.  3. Phenergan 25 mg as needed.  4. Lisinopril 10 mg once a day.  5. Toprol-XL 100 mg once a day.  6. Norvasc 10 mg once a day.  7. Omeprazole 40 mg twice daily.  8. Metformin 1000 mg twice daily.  9. Aspirin 81 mg once a day.   CONSULTATIONS:  None.   PROCEDURE:  None.   RADIOLOGY STUDIES:  Abdominal x-ray on September 18, 2008, showed no  evidence of acute problem.  Follow up with primary doctor within 1-2  weeks.   HOSPITAL COURSE:  This is 76 year old with history of idiopathic  pancreatitis who came in with findings consistent with acute on chronic  pancreatitis.  On the time of admission, her lipase is more than 1000.  The patient was admitted to the hospital for further evaluation.  Initially, she was kept n.p.o., was started on IV fluids, was put on  pain medications and antiemetics.  The patient improved very quick.  Her  lipase got back to normal.  Her diet was advanced to clear liquids and  then to soft diet.  At the time of discharge, the patient is back to her  baseline, does not have any abdominal pain, no nausea, no vomiting, no  diarrhea and she is tolerating diet well.  I advised her to continue  soft diet  for 2-3 days and then to advance her diet as tolerated over 2-  3 days.  Lipase at the time of discharge is 27.   This patient also had findings suggestive of acute gastroenteritis which  developed diarrhea following outside meal.  Stool studies were sent and  that came negative.  The patient was treated with Imodium with good  results and currently she does not have any diarrhea.   Otherwise, other medical conditions remained stable during the  hospitalization.   TOTAL ASSESSMENT TIME:  40 minutes.      Michelene Gardener, MD  Electronically Signed     NAE/MEDQ  D:  09/21/2008  T:  09/22/2008  Job:  [id number]   cc:   Merlene Laughter. Renae Gloss, M.D.  Petra Kuba, M.D.

## 2010-12-18 NOTE — H&P (Signed)
NAME:  [name] [name], OGLESBY               ACCOUNT NO.:  [id number]   MEDICAL RECORD NO.:  [id number]          PATIENT TYPE:  EMS   LOCATION:  MAJO                         FACILITY:  [facility]   PHYSICIAN:  Lonia Blood, M.D.       DATE OF BIRTH:  11/15/1934   DATE OF ADMISSION:  04/05/2005  DATE OF DISCHARGE:                                HISTORY & PHYSICAL   PRIMARY CARE PHYSICIAN:  Olene Craven, M.D.   CHIEF COMPLAINT:  Nausea and vomiting.   HISTORY OF PRESENT ILLNESS:  Ms. [name] [name] is a 76 year old woman with some  mild coronary artery disease, diabetes mellitus, hypertension, who comes in  with a 2-day history of nausea, vomiting, and abdominal pain.  She is status  post cholecystectomy about 6-7 years ago, and she did not have any abdominal  problems.  She also reports mild diarrhea and some bitter taste in her mouth  after she vomits.  She does not have any frank chest pain.  Does not have  shortness of breath.  She denies any fever or chills.   PAST MEDICAL HISTORY:  1.  Coronary artery disease.  2.  Diabetes mellitus.  3.  Hypertension.  4.  Hyperlipidemia.  5.  Cholecystectomy.   FAMILY HISTORY:  Noncontributory.   SOCIAL HISTORY:  The patient lives with her husband.  She smokes a half a  pack of cigarettes per day.  She does not drink any alcohol.  She is  retired.   DRUG ALLERGIES:  1.  PENICILLIN.  2.  CODEINE.   MEDICATIONS:  1.  Glucotrol, unknown dose.  2.  Clonidine 0.2 mg p.o. b.i.d.  3.  Zocor, unknown dose.  4.  Toprol, unknown dose.  5.  __________, unknown dose.  6.  Metformin 500 mg once a day.   REVIEW OF SYSTEMS:  As per HPI.  All other systems are negative.   PHYSICAL EXAMINATION:  VITAL SIGNS:  Temperature is 97.5, pulse 77,  respirations 16, blood pressure 150/79.  GENERAL APPEARANCE:  She is moderately obese, in no acute distress.  HEENT:  Eyes - pupils equal, round and reactive to light.  Extraocular  movements intact.  NECK:  Supple  without JVD, without carotid bruits.  No thyromegaly.  CHEST:  Clear to auscultation bilaterally without wheezing, rhonchi, or  crackles.  HEART:  Regular rate and rhythm without murmurs, rubs, or gallops.  ABDOMEN:  Soft.  She has positive tenderness in the epigastrium with  decreased bowel sounds.  No masses or hepatosplenomegaly.  GENITOURINARY:  No CVA tenderness.  EXTREMITIES:  No lower extremity edema.  SKIN:  Without any significant rashes.  NEUROLOGIC:  Cranial nerves intact.  Symmetric reflexes, and intact  sensation.   LABORATORY DATA ON ADMISSION:  Sodium of 149, potassium 3.6, chloride 110,  BUN 28, glucose 188.  Three sets of cardiac markers normal.  White blood  cell count 12,000.  Estimated neutrophil count of 10,000.  Nitrite positive.  Leukocyte esterase positive.  Normal liver function tests.  Amylase of 362.  Lipase of 334.   ASSESSMENT AND PLAN:  1.  Acute pancreatitis.  Really obscure cause in this 76 year old woman      status post cholecystectomy.  I will discontinued the patient's      hydrochlorothiazide and check a fasting lipid profile to look for      hypertriglyceridemia.  The plan is to admit the patient to the acute      unit on telemetry, keep her NPO, give her intravenous fluids, Phenergan      and Zofran p.r.n., and morphine p.r.n. for pain.  2.  Urinary tract infection.  Will send for a urinary culture and start      empiric ciprofloxacin.  3.  Diabetes mellitus.  Will keep the patient on insulin while she is      acutely ill, and will check her CBG every 4 hours.  4.  History of coronary artery disease.  Will cycle cardiac enzymes, keep      the patient on metoprolol.           ______________________________  Lonia Blood, M.D.     SL/MEDQ  D:  04/05/2005  T:  04/05/2005  Job:  [id number]   cc:   Olene Craven, M.D.  [address]  [address]  [place]  [place] [address]  Fax: [phone]

## 2010-12-18 NOTE — H&P (Signed)
NAME:  Shuey, Declan               ACCOUNT NO.:  [id number]   MEDICAL RECORD NO.:  [id number]          PATIENT TYPE:  INP   LOCATION:  3733                         FACILITY:  [facility]   PHYSICIAN:  Ricki Rodriguez, M.D.  DATE OF BIRTH:  02/20/1935   DATE OF ADMISSION:  07/12/2006  DATE OF DISCHARGE:                              HISTORY & PHYSICAL   CHIEF COMPLAINT:  Neck pain.   HISTORY OF PRESENT ILLNESS:  This 76 year old black female with known  coronary artery disease, hypertension, diabetes and smoking as risk  factors had atypical chest pain improving with some sublingual  nitroglycerin use.  Patient denies any fever, chills, nausea, vomiting  or cough.   PAST MEDICAL HISTORY:  1. Positive for diabetes for many years.  2. Positive history of smoking.  3. Positive history of coronary artery disease.  4. Positive for obesity.  5. Negative for exercise.  6. Negative for recreational drug use.  7. Negative for hyperlipidemia.  8. Negative for myocardial infarction.   PAST SURGICAL HISTORY:  1. Left ankle surgery in 2006.  2. Right knee total placement in 1998.   MEDICATIONS:  1. Metformin 500 mg twice daily.  2. Glucotrol XL 5 mg daily.  3. Benicar 40 mg daily.  4. Toprol XL 25 mg daily.  5. Clonidine .1 mg one twice daily.  6. Darvocet-N 100 four times daily as needed.  7. Aspirin 81 mg daily.  8. Phenergan 25 mg daily as needed.  9. Zantac 150 mg one daily as needed  10.Prevacid 20 mg over-the-counter 20 mg one daily as needed.   ALLERGIES:  VICODIN, CODEINE, PENICILLIN, ACTOS AND ALTACE.   FAMILY HISTORY:  Patient lives with the husband.   REVIEW OF SYSTEMS:  Patient denies recent weight gain, weight loss,  wears glasses, wears partial upper dentures, no history of asthma,  pneumonia, positive history of chest pain, exertional dyspnea,  occasional palpitations, occasional leg edema, no history of GI bleed,  positive history of reflux esophagitis, positive  history of  pancreatitis, negative history of strokes, seizures, psychiatric  admissions, negative history of kidney stones, positive history of left  ankle chronic swelling.   PHYSICAL EXAMINATION:  VITAL SIGNS:  Temperature 97, pulse 60,  respiration 18, blood pressure 170/80, oxygen saturation 98%.  Patient  is 5'9 tall, weighs approximately 210 pounds.  HEENT:  Patient is normocephalic, atraumatic, has brown eyes, wears  glasses and partial upper dentures.  NECK:  No JVD, no carotid bruit.  LUNGS:  Clear bilaterally.  HEART:  Normal S1-S2 without murmur, gallop or rub.  ABDOMEN:  Soft, distended, but nontender.  EXTREMITIES:  1+ edema, no cyanosis or clubbing.  CNS:  Grossly intact.  Patient moves all of her extremities.   LABORATORY DATA:  Pending.   EKG:  Sinus rhythm with nonspecific ST-T changes.   IMPRESSION:  1. Chest pain, rule out MI.  2. Coronary artery disease.  3. Hypertension.  4. Diabetes mellitus type 2.  5. Obesity.   PLAN:  This patient will be admitted to telemetry unit, rule out  myocardial infarction, consider nuclear stress  test since she has a 50%  RCA disease, add Lipitor to her current therapy.      Ricki Rodriguez, M.D.  Electronically Signed     ASK/MEDQ  D:  07/12/2006  T:  07/13/2006  Job:  [id number]

## 2010-12-18 NOTE — Discharge Summary (Signed)
NAME:  [name] [name], [name] [name]               ACCOUNT NO.:  [id number]   MEDICAL RECORD NO.:  [id number]          PATIENT TYPE:  INP   LOCATION:  3733                         FACILITY:  [facility]   PHYSICIAN:  Ricki Rodriguez, M.D.  DATE OF BIRTH:  [birth year]   DATE OF ADMISSION:  07/12/2006  DATE OF DISCHARGE:  07/13/2006                               DISCHARGE SUMMARY   PRINCIPAL DIAGNOSES:  1. Chest pain.  2. Multiple-vessel native vessel coronary artery disease.  3. Diabetes mellitus, type 2.  4. Hypertension.  5. Obesity.  6. Reflux esophagitis.  7. Tobacco use disorder.   DISCHARGE MEDICATIONS:  1. Aspirin 81 mg 1 daily.  2. Plavix 75 mg 1 daily.  3. Glipizide XL 5 mg 1 daily.  4. Glucophage 500 mg 1 twice daily.  5. Benicar 40 mg 1 daily.  6. Lisinopril 10 mg 1 daily.  7. Toprol XL 25 mg 1 daily.  8. Clonidine 0.1 mg 1 twice daily.  9. New medication, Lipitor 40 mg 1 daily.  10.Continue Zantac 150 mg one in the evening.  11.Prilosec 20 mg in the morning.   DISCHARGE ACTIVITIES:  The patient is to increase activity slowly.   DISCHARGE DIET:  Low sodium, heart-healthy diet and diabetic 1200-  calorie diet.   SPECIAL INSTRUCTIONS:  1. The patient is to stop any activity that causes chest pain and to      get her blood work done for Lipitor use in 6 to 8 weeks.  2. Followup by Dr. Orpah Cobb in 1 month.  The patient to call [phone] for appointment.   HISTORY:  This 76 year old black female with multiple cardiac risk  factors, had a 50% right coronary artery lesion found on cardiac  catheterization done on July 07, 2006.  She had some chest pain on  the day of admission without fever, cough, or nausea and vomiting.   PHYSICAL EXAMINATION:  Temperature 98.2, pulse 71, respirations 20,  blood pressure 175/80.  Height:  5 feet 9 inches.  Weight:  215 pounds.  HEENT:  The patient is normocephalic, atraumatic, has brown eyes, wear  glasses, and partial upper dentures.  NECK:  No JVD, no carotid bruit.  LUNGS:  Clear bilaterally.  HEART:  Normal S1, S2.  ABDOMEN:  Soft, distended but nontender.  EXTREMITIES:  No cyanosis or clubbing.  Trace edema.  CNS:  Grossly intact.  The patient moves all 4 extremities.   LABORATORY DATA:  Normal electrolytes.  BUN 24, creatinine 1.2, glucose  elevated at 180, CK-MB and troponin I negative x2.  PT and INR normal.  Normal WBC count.  Hemoglobin borderline at 11.3, hematocrit 34.2,  platelet count 252.  EKG:  Normal sinus rhythm with nonspecific T-wave  changes.  Nuclear stress test:  Negative for reversible ischemia with a  good ejection fraction.   HOSPITAL COURSE:  The patient was admitted to telemetry unit.  Myocardial infarction was ruled out.  She was started on IV Heparin and  nitroglycerin, and she underwent nuclear stress test in the morning that  failed to  show any reversible ischemia.  The patient was started on  Lipitor for her elevated triglycerides and for her plaque regression,  and she was also reminded to continue acid reducer medications and to  get herself followed up by me in 1 month.  She was also advised to  discontinue smoking.   CONDITION ON DISCHARGE:  Improved.      Ricki Rodriguez, M.D.  Electronically Signed     ASK/MEDQ  D:  07/13/2006  T:  07/14/2006  Job:  [id number]

## 2010-12-18 NOTE — Cardiovascular Report (Signed)
[place]  Patient:    [name] [name], [name] [name]                      MRN: [id number] Proc. Date: 05/03/00 Adm. Date:  [id number] Attending:  Orpah Cobb S                        Cardiac Catheterization  PROCEDURE: 1. Left heart catheterization. 2. Selective coronary angiography. 3. Left ventricular function study. 4. Descending aortography.  INDICATIONS:  This is a 77 year old black female who has a known history of coronary artery disease with significant chest pain and positive ischemia on nuclear stress test, along with cardiac risk factor of hypertension.  COMPLICATIONS:  None.  APPROACH:  Right femoral artery using 6 French diagnostic catheters.  HEMODYNAMIC DATA:  The left ventricular pressure was 173/15.  This was after sublingual nitroglycerin use.  Aortic pressure was 166/73.  Originally, the aortic pressure was 190/80, and after nitroglycerin use it came down to 166/73.  Left ventriculogram:  The left ventriculogram showed hyperdynamic wall motion with ejection fraction of 65 to 70%.  Aortogram:  The aortogram showed minimal atherosclerosis with normal renal arteries.  Coronary anatomy: 1. The left main coronary artery was unremarkable. 2. Left anterior descending artery:  The left anterior descending artery had a    proximal mild disease and had large diagonal 1 and 2 vessels. 3. Left circumflex coronary artery:  The left circumflex coronary artery was    unremarkable with small three obtuse marginal branches. 4. Right coronary artery:  The right coronary artery was dominant and had a    proximal 20 to 30% and a mid to distal junction area 20 to 30% lesions.    The posterior descending coronary artery and posterior descending branches    were unremarkable.  IMPRESSION: 1. Mild coronary artery disease. 2. Hypertensive heart disease. 3. Mild aortic atherosclerosis.  RECOMMENDATIONS:  This patient will be treated medically for  now.  Her nuclear stress test appears to be probably falsely positive. DD:  05/03/00 TD:  05/03/00 Job: [id number]

## 2010-12-18 NOTE — Cardiovascular Report (Signed)
NAME:  [name] [name], [name] [name]               ACCOUNT NO.:  [id number]   MEDICAL RECORD NO.:  [id number]          PATIENT TYPE:  OIB   LOCATION:  2899                         FACILITY:  [facility]   PHYSICIAN:  Ricki Rodriguez, M.D.  DATE OF BIRTH:  [birth year]   DATE OF PROCEDURE:  07/07/2006  DATE OF DISCHARGE:                            CARDIAC CATHETERIZATION   HOSPITAL LOCATION:  Outpatient.   Left heart catheterization, selective coronary angiography, left  ventricular function study.   INDICATIONS:  This 76 year old black female has atypical chest pain  along with cardiac risk factors of known coronary artery disease,  diabetes, hypertension, smoking and obesity.   APPROACH:  Right femoral artery using 4-French sheath and catheters.   COMPLICATIONS:  None.   HEMODYNAMIC DATA:  The left ventricular pressure was 185/16 and aortic  pressure was 180/64.   LEFT VENTRICULOGRAM:  The left ventriculogram showed hyperdynamic wall  motion with ejection fraction of 70%.   2.5 mg of Vasotec was given IV for elevated blood pressure and 50 mL of  dye was used.   CORONARY ANATOMY:  1. The left main coronary artery was unremarkable.   1. Left anterior descending coronary artery.  The left anterior      descending coronary artery had proximal 20-30% eccentric lesion in      the proximal ectatic area and following that it had post diagonal      origin 20% lesion.  The rest of the vessel was unremarkable and it      wrapped around the apex of the heart supplying half of the      posterior septum.  The diagonal I vessel had mild ostial 20% concentric lesion.   1. Left circumflex coronary artery.  The left circumflex coronary      artery was essentially unremarkable with some luminal      irregularities and the ramus branch had a proximal 30% eccentric      lesion.  Its obtuse marginal branches were small vessels.   1. Right coronary artery.  The right coronary artery was codominant      with left  circumflex coronary artery.  It had a proximal 50%      concentric lesion and had luminal irregularities throughout the      vessel with occasional 20-30% eccentric lesion.  The posterolateral branch was unremarkable and posterior descending  coronary artery was a small vessel.   IMPRESSION:  1. Multivessel native vessel coronary artery disease.  2. Hypertensive heart disease.   RECOMMENDATIONS:  This patient will continue medical therapy with  lifestyle modification.  Addition of HMG coenzyme A reductase medication  like lipitor and smoking cessation, decreasing salt intake, etc.      Ricki Rodriguez, M.D.  Electronically Signed     ASK/MEDQ  D:  07/07/2006  T:  07/07/2006  Job:  [id number]

## 2010-12-18 NOTE — Procedures (Signed)
[place]  Patient:    [name] [name], [name] [name]                      MRN: [id number] Proc. Date: 03/15/00 Adm. Date:  [id number] Attending:  Nelda Marseille CC:         Petra Kuba, M.D.  Lindell Spar. Chestine Spore, M.D.   Procedure Report  PROCEDURE PERFORMED:  Esophagogastroduodenoscopy with Savary dilatation.  ENDOSCOPIST:  Petra Kuba, M.D.  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  Consent was signed after risks, benefits, methods, and options were thoroughly discussed in the office.  MEDICATIONS USED:  Demerol 50 mg, Versed 7 mg.  DESCRIPTION OF PROCEDURE:  The video endoscope was inserted by direct vision. She did not tolerate the scope in the back of the posterior pharynx well, so a good look there was not obtained.  The scope was inserted down a normal esophagus to the distal esophagus where a moderate-sized hiatal hernia was seen with a widely patent fibrous benign-appearing ring.  The scope passed easily into the stomach and advanced through a normal antrum into a normal pylorus into a normal duodenal bulb and around the C-loop to a normal second portion of the duodenum.  The ampulla was seen and was quite bulbous. Possibly small periampullary diverticula were seen.  The scope was withdrawn back to the bulb.  No ulcer was seen.  The scope was withdrawn back to the stomach and retroflexed.  High in the cardia the hiatal hernia was confirmed. The stomach was evaluated on straight and retroflex visualization without additional findings.  She did have some minimal residual food which required some suctioning.  The scope was then slowly withdrawn back to about 20 cm.  No additional esophageal findings were seen.  The scope was then advanced to the antrum and the Savary wire was advanced and the customary J-loop of the wire was confirmed fluoroscopically.  The scope was removed and in succession  the Savary dilators 12, 8, 14, and 16 were advanced under fluoroscopy guidance making sure to keep the wire in the proper position in the stomach.  There was no resistance and no heme on all the dilators.  Once the 16 was advanced into the stomach, the wire was withdrawn into the dilator and removed in tandem. The procedure was terminated as the dilator was removed.  The patient tolerated the procedure adequately.  There was no obvious immediate complication.  ENDOSCOPIC DIAGNOSIS: 1. Medium hiatal hernia with a widely patent ring. 2. Posterior pharynx not well evaluated. 3. Bulbous ampulla with questionable periampullary diverticula.  THERAPY:  Savary dilation to 16 mm under fluoroscopy.  PLAN:  Continue Nexium since that seems to be helping.  Call p.r.n.  Otherwise follow up in six weeks to recheck the symptoms and if doing well, discuss colonic screening versus more aggressive dilatation if not versus ENT consult. D:  03/15/00 TD:  03/15/00 Job: [id number]

## 2010-12-18 NOTE — Discharge Summary (Signed)
[place]. [facility]  Patient:    [name] [name], [name] [name]                      MRN: [id number] Adm. Date:  [id number] Disc. Date: [id number] Attending:  Virgia Land                           Discharge Summary  DISCHARGE DIAGNOSES: 1. Cellulitis of the left foot and leg. 2. Sympathetic dystrophy of the left leg. 3. Systemic hypertension. 4. Esophageal reflux. 5. Coronary artery disease. 6. Significant degenerative joint disease. 7. Type 2 diabetes mellitus.  REASON FOR ADMISSION:  [name] is a 76 year old type 2 diabetic hypertensive woman who is admitted with three days of progressive pain, swelling, and warmth of left leg.  The patient stated that one day prior to admission she had an episode of chills so severe it made her teeth chatter. She denies any injury or insect bite to the foot which initially began to swell just below the lateral malleolus.  She was seen by her podiatrist yesterday and started on Levaquin and told to contact her medical physician for further follow-up.  PERTINENT PHYSICAL FINDINGS ON ADMISSION:  GENERAL:  She is a well-developed obese African-American woman appearing anxious and uncomfortable.  HEENT:  Her EOMs are full, no nystagmus.  Pupils equal, round, and reactive to light and accommodation.  Her sclerae are anicteric and no conjunctival pallor.  Her pharynx was clear.  NECK:  Supple.  No JVD or adenopathy.  CHEST:  No splinting, tenderness, or deformities.  Her lungs are clear to percussion and auscultation.  CARDIOVASCULAR:  She had a regular rhythm.  No murmurs, rubs, gallops, heaves, or thrills.  ABDOMEN:  Soft and nontender.  EXTREMITIES:  She has 3+ tense swelling of the left foot and ankle with induration of the posterior lateral aspect of the distal leg.  No angiectatic streaking, no venous cords noted.  NEUROLOGIC:  She is alert, oriented, cooperative.  No focal sensory or motor or reflex  deficit.  There was a slight decrease in pinprick and vibration.  LABORATORY AND X-RAY DATA:  Her white count was increased to 13,000.  Her EKG reveals a normal sinus rhythm.  She has minimal voltage criteria for LVH.  A venous Doppler shows no evidence of DVT or superficial thrombosis or Bakers cyst of that distal leg.  Chest x-ray:  The heart is mildly enlarged, clear lung fields.  She has degenerative changes throughout the dorsal spine.  The left ankle shows soft tissue swelling, no fractures, dislocations, no focal areas of osteolysis, an no changes suggestive of osteomyelitis.  The left foot shows hypertrophic and degenerative changes present in the left forefoot and midfoot.  A large plantar spur is located near the calcaneus.  There is no evidence of a fracture.  No local areas of osteolysis are identified and nothing to suggest osteomyelitis.  There are degenerative changes in the forefoot and midfoot, but no other skeletal abnormalities.  CBC:  White count is elevated at 13,100 with a shift to the left, hematocrit of 36.6, hemoglobin 12.  The initial potassium was low at 3.4, glucose was normal at 70, and her TSH was normal at 1.345.  Urinalysis:  There are many epithelial cells, negative for nitrites, leukocyte esterase.  She has 30 mg% of protein, specific gravity of 1.031, pH 5.5, no red cells, no white cells. Blood cultures x 2  revealed no growth.  HOSPITAL COURSE:  The patient is admitted into the hospital for parenteral antibiotic therapy and to elevate the leg and reduce the dependency and swelling of that leg.  She was afebrile, but as noted before, her white count was elevated to 10,000.  Started on Levaquin 500 mg IV q.d.  Her morning blood sugars of 58 are excellent.  The patient has had no symptoms of hypoglycemia, and she has continued to have her blood sugar monitored and controlled with a sliding scale regimen of Humalog.  The patient continued to show an  increase in her white count to 12,500 despite her Levaquin, and  a nonsteroidal anti-inflammatory agent was added to her regimen to reduce some of the inflammation and pain.  She was started on Vioxx at 25 mg q.d.  There was one episode of hypoglycemia on the third hospital day, and for that reason her Glucotrol was reduced as required.  Her potassium of 3.0 was low, and supplements were initiated.  She continued to show slow improvement in the swelling and pain in that leg, but it was clearly a very slow process.  The physical changes of her leg exhibiting mostly tenderness and warmth were all consistent with cellulitis.  The pain syndrome, however, suggested the possibility of a sympathetic dystrophy as a possible complication of this inflammatory process.  With continued elevation and antibiotic, we finally got a significant reduction in swelling and soreness of that leg, and the patient was able to bear weight and ambulate in the room.  At this point, we decided to change her Levaquin to the p.o. method, and she was subsequently discharged home where she is still instructed to keep the leg elevated unless she is ambulating.  DISCHARGE CONDITION:  Significantly improved.  PROGNOSIS:  Considered to be fair.  DISCHARGE MEDICATIONS:  She is to resume the same meds she was using prior to coming into the hospital: 1. Plavix 75 mg q.d. 2. Cardizem CD 240 one q.d. 3. Glucophage. 4. Levaquin 500 q.d. 5. Toprol. 6. In addition, Clinoril 200 mg one tablet b.i.d. will be needed for any type    of anti-inflammatory component. 7. Glucotrol XL 10 mg one q.d. is her hypoglycemic agent.  DISCHARGE INSTRUCTIONS:  Diet:  A 4-gram sodium, 1500-calorie ADA diet.  She is instructed to continue either warm or cool compresses to the legs and feet depending on which temperature makes her feel the best.  DISCHARGE FOLLOWUP:  She is scheduled to be seen in my office in two weeks. DD:  10/27/00 TD:   10/28/00 Job: [id number]

## 2010-12-18 NOTE — Discharge Summary (Signed)
[facility]  Patient:    [name] [name], [name] [name]                      MRN: [id number] Adm. Date:  [id number] Disc. Date: [id number] Attending:  Orpah Cobb S                           Discharge Summary  PRINCIPAL DIAGNOSES: 1. Angina. 2. Gastroesophageal reflux disease. 3. Hypertension. 4. Diabetes mellitus type 2. 5. Obesity.  DISCHARGE MEDICATIONS: 1. Glucotrol XL 10 mg 1 p.o. daily. 2. Enteric-coated aspirin 325 mg 1 p.o. daily. 3. Plavix 75 mg 1 p.o. daily. 4. Protonix 40 mg 1 p.o. daily. 5. Cardizem CD 240 mg 1 p.o. daily. 6. Altace 10 mg 1 twice daily. 7. Toprol XL 25 mg 1 daily. 8. Nitroglycerin 0.4 mg tablet 1 under the tongue every five minutes x 3 as    needed for chest pain.  FOLLOW-UP:  By Dr. Orpah Cobb in two weeks.  WOUND CARE:  Patient to notify if right groin pain, swelling, or discharge.  ACTIVITY:  As tolerated after two days of sedentary lifestyle, and no driving for two to three days.  DIET:  Low fat, low salt, 1200 calorie diabetic diet.  HISTORY OF PRESENT ILLNESS:  This 76 year old black female had atypical angina with a known history of coronary artery disease with risk factors of diabetes, hypertension, and obesity.  PHYSICAL EXAMINATION:  VITAL SIGNS:  Temperature 98, pulse 56, blood pressure 140/72, respirations 16.  GENERAL:  Alert and oriented x 3.  HEENT:  Grossly unremarkable.  NECK:  No JVD, no carotid bruit.  LUNGS:  Clear to auscultation bilaterally.  HEART:  Normal S1, S2, with a soft systolic murmur left sternal border.  ABDOMEN:  Soft.  Bowel sounds positive.  EXTREMITIES:  No edema, cyanosis, or clubbing.  LABORATORY DATA:  Normal CPK and MBs.  Borderline troponin I level. Borderline hemoglobin and hematocrit level.  Borderline electrolytes.  BUN and creatinine were normal.  EKG was normal sinus rhythm with T-wave changes in anterolateral area.  Adenosine Cardiolite stress test was positive  for apical ischemia.  Cardiac catheterization showed mild coronary artery disease and hypertensive heart disease, otherwise was unremarkable, with a good left ventricular systolic function.  HOSPITAL COURSE:  The patient was admitted to telemetry unit.  Myocardial infarction was ruled out; however, she underwent adenosine Cardiolite stress test due to borderline high troponin level and atypical angina, known history of coronary artery disease.  The patients nuclear stress test images showed apical ischemia, and she underwent cardiac catheterization which did not show any progression of her disease from the last time of her heart catheterization two years ago.  She had mild disease in both right coronary artery and left anterior descending coronary artery.  Her left ventricular systolic function was normal, and her blood pressures were somewhat high, suggesting hypertensive heart disease.  The patient tolerated the procedure well and had no complications and was discharged home in satisfactory condition after the required six hours of bed rest and no hematoma formation and with good ambulation.  The patient will be followed by me in two weeks.  She was instructed to change her diet, activities, lifestyle, and avoid smoking as much as possible.  CONDITION ON DISCHARGE:  Stable. DD:  05/03/00 TD:  05/04/00 Job: [id number]

## 2010-12-18 NOTE — Discharge Summary (Signed)
NAME:  [name], [name]               ACCOUNT NO.:  [id number]   MEDICAL RECORD NO.:  [id number]          PATIENT TYPE:  INP   LOCATION:  6734                         FACILITY:  [facility]   PHYSICIAN:  Jonna L. Robb Matar, M.D.DATE OF BIRTH:  07-Sep-1934   DATE OF ADMISSION:  04/05/2005  DATE OF DISCHARGE:                                 DISCHARGE SUMMARY   PRIMARY CARE PHYSICIAN:  Dr. Kern Reap.   FINAL DIAGNOSES:  1.  Acute pancreatitis secondary to adverse effect of hydrochlorothiazide or      Zocor.  2.  Urinary tract infection.  3.  Bradycardia.  4.  Coronary artery disease.  5.  Type 2 diabetes.  6.  Hypertension.   OPERATIONS:  None.   ALLERGIES:  PENICILLIN and CODEINE.   CODE STATUS:  Full.   HISTORY:  This 76 year old with coronary artery disease, diabetes and  hypertension had a 2-day history of nausea, vomiting, upper abdominal pain.  She is status post cholecystectomy.  She does not drink alcohol.  She has  not had a high triglyceride level.   PHYSICAL EXAMINATION:  Physical exam on admission shows normal vital signs,  epigastric area tenderness.   LABORATORY DATA:  Glucose 188.  Negative cardiac enzymes.  Nitrite and  esterase positive, and elevated amylase at 362 and lipase 334.   HOSPITAL COURSE:  The patient was put on n.p.o., IV fluids, Phenergan and  Zofran and within a few days, her abdominal pain subsided.  She was put on  ciprofloxacin for a urinary tract infection; this came back E. coli, which  was sensitive for fluoroquinolones.  For her diabetes, she was put on  sliding-scale insulin.  For her coronary artery disease, she was kept on  metoprolol and clonidine, and on April 08, 2005, developed an episode of  bradycardia with a 2- to 3-second pause.  The Toprol was decreased, as was  the clonidine.   DISPOSITION:  The patient will be evaluated for her bradycardia; if there  are no further episodes, she will be discharged on:   DISCHARGE  MEDICATIONS:  1.  Omeprazole 20 mg daily.  2.  Glucotrol XL 10 mg daily.  3.  Metformin 500 mg daily.  4.  Avapro 150 mg daily.  5.  Clonidine 0.1 mg nightly.  6.  Toprol decreased to 12.5 mg daily.   DIET:  She is to gradually resume eating.   FOLLOWUP:  She is to return to see Dr. Barbee Shropshire in 2-4 weeks; at that time,  her lipids may need to be reevaluated and an alternative to statin drugs  found if necessary.      Jonna L. Robb Matar, M.D.  Electronically Signed     JLB/[id number]  D:  04/09/2005  T:  04/09/2005  Job:  [id number]   cc:   Olene Craven, M.D.  [address]  [address]  [place]  [place] [address]  Fax: [phone]

## 2010-12-18 NOTE — Procedures (Signed)
[place]  Patient:    [name] [name], [name] [name]                      MRN: [id number] Proc. Date: 12/19/00 Adm. Date:  [id number] Attending:  Nelda Marseille CC:         Lindell Spar. Chestine Spore, M.D.   Procedure Report  PROCEDURE PERFORMED:  Colonoscopy with biopsy.  ENDOSCOPIST:  Petra Kuba, M.D.  INDICATIONS FOR PROCEDURE:  Patient with family history of colon cancer, due for colonic screening.  Consent was signed after risks, benefits, methods, and options were thoroughly discussed in the office.  MEDICATIONS USED:  Demerol 80 mg, Versed 6 mg.  DESCRIPTION OF PROCEDURE:   Rectal inspection was pertinent for external hemorrhoids.  Digital exam was negative.  The video colonoscope was inserted and with some difficulty due to a tortuous looping colon, we were able to advance to the cecum.  This did require rolling her on her back and some abdominal pressure.  The cecum was identified by the appendiceal orifice and the ileocecal valve.  On insertion, some occasional left-sided diverticula were seen but no other significant abnormalities.  As the scope was withdrawn back through the colon, the prep was adequate.  There was some liquid stool that required washing and suctioning.  The cecum, ascending and transverse were normal.  There was an occasional left-sided diverticula.  In the distal sigmoid and rectum, some hyperplastic-appearing polyps were seen, a few of which were cold biopsied.  The scope was retroflexed pertinent for some small internal hemorrhoids.  Scope was straightened.  Air was withdrawn.  The scope removed. The patient tolerated the procedure adequately.  There was no obvious immediate complication.  ENDOSCOPIC DIAGNOSIS: 1. Internal and external hemorrhoids. 2. Occasional left diverticula. 3. Hyperplastic-appearing rectal and distal sigmoid polyps, few cold    biopsied. 4. Otherwise within normal limits to the cecum.  PLAN:   Await pathology but would probably repeat colon screening in five years.  Gastrointestinal follow-up in two months to recheck symptoms and make sure no further work-up plans are needed.  Otherwise return care to Dr. Chestine Spore for customary health care maintenance include year rectals and guaiacs. DD:  12/19/00 TD:  12/19/00 Job: [id number]

## 2011-04-27 LAB — COMPREHENSIVE METABOLIC PANEL
ALT: 28
AST: 32
CO2: 19
Calcium: 8.9
Creatinine, Ser: 1.23 — ABNORMAL HIGH
GFR calc Af Amer: 52 — ABNORMAL LOW
GFR calc non Af Amer: 43 — ABNORMAL LOW
Sodium: 140
Total Protein: 7.4

## 2011-04-27 LAB — URINALYSIS, ROUTINE W REFLEX MICROSCOPIC
Glucose, UA: NEGATIVE
Hgb urine dipstick: NEGATIVE
Ketones, ur: 15 — AB
Protein, ur: 100 — AB
Urobilinogen, UA: 0.2

## 2011-04-27 LAB — DIFFERENTIAL
Eosinophils Absolute: 0
Eosinophils Relative: 0
Lymphocytes Relative: 5 — ABNORMAL LOW
Lymphs Abs: 0.6 — ABNORMAL LOW
Monocytes Relative: 1 — ABNORMAL LOW
Neutrophils Relative %: 94 — ABNORMAL HIGH

## 2011-04-27 LAB — CBC
MCHC: 33
MCV: 80.8
RDW: 14.2

## 2011-04-27 LAB — URINE MICROSCOPIC-ADD ON

## 2011-05-17 LAB — CBC
HCT: 34.6 — ABNORMAL LOW
HCT: 35.5 — ABNORMAL LOW
Hemoglobin: 11.6 — ABNORMAL LOW
Hemoglobin: 13.8
MCHC: 33
MCHC: 33.6
MCV: 80.2
MCV: 81.1
Platelets: 306
RBC: 5.12 — ABNORMAL HIGH
RDW: 13.8
RDW: 13.9
WBC: 19.5 — ABNORMAL HIGH

## 2011-05-17 LAB — BASIC METABOLIC PANEL
CO2: 24
Chloride: 112
GFR calc non Af Amer: 56 — ABNORMAL LOW
Glucose, Bld: 107 — ABNORMAL HIGH
Potassium: 3.5
Sodium: 142

## 2011-05-17 LAB — URINE MICROSCOPIC-ADD ON

## 2011-05-17 LAB — DIFFERENTIAL
Basophils Absolute: 0
Eosinophils Absolute: 0.2
Eosinophils Relative: 1
Lymphs Abs: 0.8
Monocytes Absolute: 1.2 — ABNORMAL HIGH
Neutrophils Relative %: 89 — ABNORMAL HIGH

## 2011-05-17 LAB — URINALYSIS, ROUTINE W REFLEX MICROSCOPIC
Bilirubin Urine: NEGATIVE
Ketones, ur: NEGATIVE
Specific Gravity, Urine: 1.021
Urobilinogen, UA: 0.2

## 2011-05-17 LAB — COMPREHENSIVE METABOLIC PANEL
ALT: 16
AST: 20
Albumin: 3.3 — ABNORMAL LOW
Alkaline Phosphatase: 58
BUN: 22
CO2: 18 — ABNORMAL LOW
Calcium: 8.5
Chloride: 113 — ABNORMAL HIGH
Creatinine, Ser: 1.3 — ABNORMAL HIGH
GFR calc Af Amer: >60
GFR calc non Af Amer: 51 — ABNORMAL LOW
Glucose, Bld: 171 — ABNORMAL HIGH
Potassium: 4.2
Sodium: 141
Total Bilirubin: 0.5
Total Protein: 6.3

## 2011-05-17 LAB — HEMOGLOBIN A1C
Hgb A1c MFr Bld: 8.3 — ABNORMAL HIGH
Mean Plasma Glucose: 218

## 2011-05-17 LAB — URINE CULTURE

## 2011-05-17 LAB — LIPASE, BLOOD: Lipase: 674 — ABNORMAL HIGH

## 2011-05-17 LAB — LIPID PANEL
HDL: 37 — ABNORMAL LOW
LDL Cholesterol: 136 — ABNORMAL HIGH
Triglycerides: 110

## 2011-06-08 ENCOUNTER — Other Ambulatory Visit: Payer: Self-pay

## 2011-06-08 ENCOUNTER — Observation Stay (HOSPITAL_COMMUNITY)
Admission: AD | Admit: 2011-06-08 | Discharge: 2011-06-09 | Disposition: A | Discharge status: Home / Self Care | Payer: Medicare Other | Source: Ambulatory Visit | Attending: Cardiovascular Disease | Admitting: Cardiovascular Disease

## 2011-06-08 DIAGNOSIS — R079 Chest pain, unspecified: Principal | ICD-10-CM

## 2011-06-08 DIAGNOSIS — E119 Type 2 diabetes mellitus without complications: Secondary | ICD-10-CM | POA: Insufficient documentation

## 2011-06-08 DIAGNOSIS — Z6830 Body mass index (BMI) 30.0-30.9, adult: Secondary | ICD-10-CM | POA: Insufficient documentation

## 2011-06-08 DIAGNOSIS — E669 Obesity, unspecified: Secondary | ICD-10-CM | POA: Diagnosis present

## 2011-06-08 DIAGNOSIS — I2510 Atherosclerotic heart disease of native coronary artery without angina pectoris: Secondary | ICD-10-CM | POA: Diagnosis present

## 2011-06-08 DIAGNOSIS — I10 Essential (primary) hypertension: Secondary | ICD-10-CM | POA: Diagnosis present

## 2011-06-08 DIAGNOSIS — F17200 Nicotine dependence, unspecified, uncomplicated: Secondary | ICD-10-CM | POA: Insufficient documentation

## 2011-06-08 DIAGNOSIS — R51 Headache: Secondary | ICD-10-CM | POA: Insufficient documentation

## 2011-06-08 LAB — CARDIAC PANEL(CRET KIN+CKTOT+MB+TROPI)
CK, MB: 2.2 ng/mL (ref 0.3–4.0)
Relative Index: INVALID (ref 0.0–2.5)
Total CK: 93 U/L (ref 7–177)
Total CK: 84 U/L (ref 7–177)

## 2011-06-08 LAB — CBC
HCT: 34.1 % — ABNORMAL LOW (ref 36.0–46.0)
MCV: 81.4 fL (ref 78.0–100.0)
RBC: 4.19 MIL/uL (ref 3.87–5.11)
WBC: 7.7 10*3/uL (ref 4.0–10.5)

## 2011-06-08 LAB — HEMOGLOBIN A1C
Hgb A1c MFr Bld: 6.7 % — ABNORMAL HIGH (ref <5.7)
Mean Plasma Glucose: 146 mg/dL — ABNORMAL HIGH (ref <117)

## 2011-06-08 LAB — COMPREHENSIVE METABOLIC PANEL
ALT: 9 U/L (ref 0–35)
AST: 11 U/L (ref 0–37)
CO2: 23 mEq/L (ref 19–32)
Calcium: 9.5 mg/dL (ref 8.4–10.5)
Chloride: 111 mEq/L (ref 96–112)
GFR calc non Af Amer: 54 mL/min — ABNORMAL LOW (ref >90)
Sodium: 144 mEq/L (ref 135–145)

## 2011-06-08 LAB — DIFFERENTIAL
Basophils Absolute: 0 10*3/uL (ref 0.0–0.1)
Basophils Relative: 0 % (ref 0–1)
Eosinophils Absolute: 0.6 10*3/uL (ref 0.0–0.7)
Lymphocytes Relative: 43 % (ref 12–46)

## 2011-06-08 LAB — GLUCOSE, CAPILLARY

## 2011-06-08 LAB — OCCULT BLOOD X 1 CARD TO LAB, STOOL: Fecal Occult Bld: NEGATIVE

## 2011-06-08 MED ORDER — PRAMOXINE HCL 1 % RE OINT: 1.0000 "application " | TOPICAL_OINTMENT | Freq: Three times a day (TID) | RECTAL | Status: DC | PRN

## 2011-06-08 MED ORDER — CLOPIDOGREL BISULFATE 75 MG PO TABS
75.0000 mg | ORAL_TABLET | Freq: Every day | ORAL | Status: DC
  Administered 2011-06-09: 75 mg via ORAL
  Filled 2011-06-08 (×2): qty 1

## 2011-06-08 MED ORDER — ASPIRIN EC 81 MG PO TBEC
81.0000 mg | DELAYED_RELEASE_TABLET | Freq: Every day | ORAL | Status: DC
  Administered 2011-06-09: 81 mg via ORAL
  Filled 2011-06-08: qty 1

## 2011-06-08 MED ORDER — HEPARIN BOLUS VIA INFUSION
4000.0000 [IU] | Freq: Once | INTRAVENOUS | Status: AC
  Administered 2011-06-08: 4000 [IU] via INTRAVENOUS

## 2011-06-08 MED ORDER — SODIUM CHLORIDE 0.9 % IJ SOLN: 3.0000 mL | INTRAMUSCULAR | Status: DC | PRN

## 2011-06-08 MED ORDER — PANTOPRAZOLE SODIUM 40 MG PO TBEC
40.0000 mg | DELAYED_RELEASE_TABLET | Freq: Every day | ORAL | Status: DC
  Administered 2011-06-08 – 2011-06-09 (×2): 40 mg via ORAL
  Filled 2011-06-08 (×2): qty 1

## 2011-06-08 MED ORDER — LOPERAMIDE HCL 2 MG PO CAPS: 4.0000 mg | ORAL_CAPSULE | Freq: Once | ORAL | Status: AC | PRN

## 2011-06-08 MED ORDER — AMLODIPINE BESYLATE 10 MG PO TABS
10.0000 mg | ORAL_TABLET | Freq: Every day | ORAL | Status: DC
  Administered 2011-06-09: 10 mg via ORAL
  Filled 2011-06-08 (×2): qty 1

## 2011-06-08 MED ORDER — HYDROCORTISONE 1 % EX CREA: 1.0000 "application " | TOPICAL_CREAM | Freq: Three times a day (TID) | CUTANEOUS | Status: DC | PRN

## 2011-06-08 MED ORDER — HEPARIN (PORCINE) IN NACL 100-0.45 UNIT/ML-% IJ SOLN
900.0000 [IU]/h | INTRAMUSCULAR | Status: DC
  Administered 2011-06-08: 1050 [IU]/h via INTRAVENOUS
  Filled 2011-06-08 (×2): qty 250

## 2011-06-08 MED ORDER — ASPIRIN 81 MG PO CHEW
324.0000 mg | CHEWABLE_TABLET | ORAL | Status: AC
  Administered 2011-06-08: 324 mg via ORAL
  Filled 2011-06-08: qty 4

## 2011-06-08 MED ORDER — ACETAMINOPHEN 325 MG PO TABS
650.0000 mg | ORAL_TABLET | ORAL | Status: DC | PRN
  Administered 2011-06-09: 650 mg via ORAL
  Filled 2011-06-08: qty 2

## 2011-06-08 MED ORDER — SODIUM CHLORIDE 0.9 % IJ SOLN
3.0000 mL | Freq: Two times a day (BID) | INTRAMUSCULAR | Status: DC
  Administered 2011-06-08 (×2): 3 mL via INTRAVENOUS

## 2011-06-08 MED ORDER — GUAIFENESIN-DM 100-10 MG/5ML PO SYRP: 15.0000 mL | ORAL_SOLUTION | ORAL | Status: DC | PRN

## 2011-06-08 MED ORDER — INSULIN ASPART 100 UNIT/ML ~~LOC~~ SOLN
0.0000 [IU] | Freq: Three times a day (TID) | SUBCUTANEOUS | Status: DC
  Administered 2011-06-08 – 2011-06-09 (×3): 2 [IU] via SUBCUTANEOUS
  Filled 2011-06-08: qty 3

## 2011-06-08 MED ORDER — NITROGLYCERIN 0.4 MG SL SUBL: 0.4000 mg | SUBLINGUAL_TABLET | SUBLINGUAL | Status: DC | PRN

## 2011-06-08 MED ORDER — MAGNESIUM HYDROXIDE 400 MG/5ML PO SUSP: 30.0000 mL | Freq: Every day | ORAL | Status: DC | PRN

## 2011-06-08 MED ORDER — LOSARTAN POTASSIUM 50 MG PO TABS
50.0000 mg | ORAL_TABLET | Freq: Two times a day (BID) | ORAL | Status: DC
  Administered 2011-06-08 – 2011-06-09 (×2): 50 mg via ORAL
  Filled 2011-06-08 (×3): qty 1

## 2011-06-08 MED ORDER — ASPIRIN 300 MG RE SUPP
300.0000 mg | RECTAL | Status: AC
  Filled 2011-06-08: qty 1

## 2011-06-08 MED ORDER — METFORMIN HCL 500 MG PO TABS
500.0000 mg | ORAL_TABLET | Freq: Two times a day (BID) | ORAL | Status: DC
  Administered 2011-06-08 – 2011-06-09 (×3): 500 mg via ORAL
  Filled 2011-06-08 (×4): qty 1

## 2011-06-08 MED ORDER — LISINOPRIL 40 MG PO TABS
40.0000 mg | ORAL_TABLET | Freq: Every day | ORAL | Status: DC
  Administered 2011-06-08 – 2011-06-09 (×2): 40 mg via ORAL
  Filled 2011-06-08 (×2): qty 1

## 2011-06-08 MED ORDER — METOPROLOL SUCCINATE ER 25 MG PO TB24
12.5000 mg | ORAL_TABLET | Freq: Two times a day (BID) | ORAL | Status: DC
  Administered 2011-06-08 – 2011-06-09 (×2): 12.5 mg via ORAL
  Filled 2011-06-08 (×3): qty 0.5

## 2011-06-08 MED ORDER — ALUM & MAG HYDROXIDE-SIMETH 200-200-20 MG/5ML PO SUSP: 15.0000 mL | ORAL | Status: DC | PRN

## 2011-06-08 MED ORDER — CLONIDINE HCL 0.1 MG PO TABS
0.1000 mg | ORAL_TABLET | Freq: Two times a day (BID) | ORAL | Status: DC
  Administered 2011-06-08 (×2): 0.1 mg via ORAL
  Filled 2011-06-08 (×5): qty 1

## 2011-06-08 MED ORDER — ONDANSETRON HCL 4 MG/2ML IJ SOLN: 4.0000 mg | Freq: Four times a day (QID) | INTRAMUSCULAR | Status: DC | PRN

## 2011-06-08 NOTE — Progress Notes (Signed)
ANTICOAGULATION CONSULT NOTE - Initial Consult  Pharmacy Consult for Heparin Indication: chest pain/ACS  Allergies  Allergen Reactions  . Codeine Rash  . Penicillins Rash    Patient Measurements: Height: 5\' 9"  (175.3 cm) Weight: 206 lb 2.1 oz (93.5 kg) IBW/kg (Calculated) : 66.2  Adjusted Body Weight:   Vital Signs: Temp: 98.1 F (36.7 C) (11/06 1335) Temp src: Oral (11/06 1335) BP: 142/63 mmHg (11/06 1335) Pulse Rate: 60  (11/06 1335)  Labs:  Basename 06/08/11 1350  HGB 11.0*  HCT 34.1*  PLT 256  APTT 31  LABPROT 14.9  INR 1.15  HEPARINUNFRC --  CREATININE --  CKTOTAL --  CKMB --  TROPONINI --   Estimated Creatinine Clearance: 63.9 ml/min (by C-G formula based on Cr of 0.94).  Medical History: No past medical history on file. HTN, DM  Medications:  No prescriptions prior to admission    Assessment: 76yof to start heparin for ACS/CP. Pt reports not taking any anticoagulants and reports no bleeding at this time. Pt to have stress test in AM. Baseline INR 1.15, Hg 11, Wt 93kg  Goal of Therapy:  Heparin level 0.3-0.7 units/ml   Plan:  1. Heparin IV bolus 4000 units x 1 2. Heparin drip 1050 units/hr (10.5 ml/hr) 3. Check heparin level 8hrs after heparin initiation 4. Daily heparin level and CBC  Cleon Dew [phone] 06/08/2011,2:42 PM

## 2011-06-08 NOTE — H&P (Signed)
[name] [name] is an 76 y.o. female.   Chief Complaint: Chest pressure off and on x 1 week. HPI: 76 years old black female with recurrent chest pressure x 1 week. No sweating spell or shortness of breath. No nausea or vomiting. No cough and cold.  Past Medical History: The patient has a history of DM, II x 20 years, hypertension x 30 years, Smoking 1/3 pack of cigarettes x 40 years and obesity. No history of alcohol intake, street drugs.  Past Surgical History:  Hysterectomy-30 + years ago Gall bladder surgery 15 years ago. Right knee replacement- 2005. ERCP for pancreatic inflammation. Left ankle surgery-2006 and 2009.    Family History : Mom died of congestive heart failure age 54. Dad died of prostate cancer age 56. 1 brother living with hypertension, DM, II and prostate cancer and 3 sisters, 2 died of cancer. One living and well.  Social History:  Married, husband 22 years old. She has 3 sons and 3 daughters. She has part-time Patent attorney job.  Allergies:  Allergies  Allergen Reactions  . Codeine Rash  . Penicillins Rash    Medications Prior to Admission  Medication Dose Route Frequency Provider Last Rate Last Dose  . acetaminophen (TYLENOL) tablet 650 mg  650 mg Oral Q4H PRN Ricki Rodriguez, MD      . alum & mag hydroxide-simeth (MAALOX/MYLANTA) 200-200-20 MG/5ML suspension 15-30 mL  15-30 mL Oral Q2H PRN Ricki Rodriguez, MD      . amLODipine (NORVASC) tablet 10 mg  10 mg Oral Daily Ricki Rodriguez, MD      . aspirin chewable tablet 324 mg  324 mg Oral NOW Ricki Rodriguez, MD       Or  . aspirin suppository 300 mg  300 mg Rectal NOW Ricki Rodriguez, MD      . aspirin EC tablet 81 mg  81 mg Oral Daily Ricki Rodriguez, MD      . cloNIDine (CATAPRES) tablet 0.1 mg  0.1 mg Oral BID Ricki Rodriguez, MD      . clopidogrel (PLAVIX) tablet 75 mg  75 mg Oral Q breakfast Ricki Rodriguez, MD      . guaiFENesin-dextromethorphan (ROBITUSSIN DM) 100-10 MG/5ML syrup 15 mL  15 mL  Oral Q4H PRN Ricki Rodriguez, MD      . hydrocortisone 1 % cream 1 application  1 application Topical TID PRN Ricki Rodriguez, MD      . lisinopril (PRINIVIL,ZESTRIL) tablet 40 mg  40 mg Oral Daily Ricki Rodriguez, MD      . loperamide (IMODIUM) capsule 4 mg  4 mg Oral Once PRN Ricki Rodriguez, MD      . losartan (COZAAR) tablet 50 mg  50 mg Oral BID Ricki Rodriguez, MD      . magnesium hydroxide (MILK OF MAGNESIA) suspension 30 mL  30 mL Oral Daily PRN Ricki Rodriguez, MD      . metFORMIN (GLUCOPHAGE) tablet 500 mg  500 mg Oral BID WC Ricki Rodriguez, MD      . metoprolol succinate (TOPROL-XL) 24 hr tablet 12.5 mg  12.5 mg Oral BID Ricki Rodriguez, MD      . nitroGLYCERIN (NITROSTAT) SL tablet 0.4 mg  0.4 mg Sublingual Q5 min PRN Ricki Rodriguez, MD       ondansetron (ZOFRAN) injection 4 mg  4 mg Intravenous Q6H PRN Ricki Rodriguez, MD      .  pantoprazole (PROTONIX) EC tablet 40 mg  40 mg Oral Q1200 Ricki Rodriguez, MD      . pramoxine-mineral oil-zinc (TUCK'S) rectal ointment 1 application  1 application Rectal TID PRN Ricki Rodriguez, MD      . sodium chloride 0.9 % injection 3 mL  3 mL Intravenous Q12H Ricki Rodriguez, MD      . sodium chloride 0.9 % injection 3 mL  3 mL Intravenous PRN Ricki Rodriguez, MD       No current outpatient prescriptions on file as of 06/08/2011.    Results for orders placed during the hospital encounter of 06/08/11 (from the past 48 hour(s))  GLUCOSE, CAPILLARY     Status: Abnormal   Collection Time   06/08/11 12:24 PM      Component Value Range Comment   Glucose-Capillary 176 (*) 70 - 99 (mg/dL)    No results found.  Review of Systems - General ROS: positive for  - Wears glasses, Upper dentures and joint pains. negative for - Hearing loss, asthma, hemoptysis, gastrointestinal bleed, hepatitis, kidney stone, stroke, seizures or psychiatric admissions.  Blood pressure 138/72, pulse 66, temperature 97.5 F (36.4 C), temperature source Oral, resp. rate 18, height 5\' 9"   (1.753 m), weight 93.5 kg (206 lb 2.1 oz), SpO2 98.00%. Physical Examination: General appearance - alert, well appearing, and in mild distress, oriented to person, place, and time, overweight, well hydrated and anxious Physical Examination: Eyes - Brown, pupils equal and reactive, extraocular eye movements intact, sclera anicteric Ears - bilateral TM's and external ear canals normal Nose - normal and patent, no erythema, discharge or polyps Mouth - mucus membrane pink and moist Neck - supple, no significant adenopathy Chest - clear to auscultation, no wheezes, rales or rhonchi, symmetric air entry Heart - normal rate and regular rhythm, S1 and S2 normal. II/VI diastolic murmur left sternal border. Abdomen - soft, nontender, distended, no masses or organomegaly Neurological - alert, oriented, normal speech, no focal findings or movement disorder noted Extremities - peripheral pulses normal, no pedal edema, no clubbing or cyanosis Skin - normal coloration and turgor, no rashes, no suspicious skin lesions noted Assessment/Plan Chest pain-786.50 Coronary Artery Disease-414.01 DM, II-250.00 Obesity-278.00 Hypertension-401.9  Plan: 1. Place in observation/Telemetry. 2. Cycle cardiac enzymes 3. Nuclear stress test in AM 4. Home medications.  Ailene Royal S 06/08/2011, 1:10 PM

## 2011-06-09 ENCOUNTER — Inpatient Hospital Stay (HOSPITAL_COMMUNITY): Payer: Medicare Other

## 2011-06-09 LAB — CBC
HCT: 35.4 % — ABNORMAL LOW (ref 36.0–46.0)
Hemoglobin: 11.4 g/dL — ABNORMAL LOW (ref 12.0–15.0)
MCHC: 32.2 g/dL (ref 30.0–36.0)
RBC: 4.35 MIL/uL (ref 3.87–5.11)

## 2011-06-09 LAB — HEPARIN LEVEL (UNFRACTIONATED)
Heparin Unfractionated: 0.87 IU/mL — ABNORMAL HIGH (ref 0.30–0.70)
Heparin Unfractionated: 0.34 IU/mL (ref 0.30–0.70)

## 2011-06-09 LAB — GLUCOSE, CAPILLARY: Glucose-Capillary: 121 mg/dL — ABNORMAL HIGH (ref 70–99)

## 2011-06-09 LAB — CARDIAC PANEL(CRET KIN+CKTOT+MB+TROPI): Total CK: 75 U/L (ref 7–177)

## 2011-06-09 MED ORDER — TECHNETIUM TC 99M TETROFOSMIN IV KIT
10.0000 | PACK | Freq: Once | INTRAVENOUS | Status: AC | PRN
  Administered 2011-06-09: 10 via INTRAVENOUS

## 2011-06-09 MED ORDER — TECHNETIUM TC 99M TETROFOSMIN IV KIT
30.0000 | PACK | Freq: Once | INTRAVENOUS | Status: AC | PRN
  Administered 2011-06-09: 30 via INTRAVENOUS

## 2011-06-09 MED ORDER — REGADENOSON 0.4 MG/5ML IV SOLN
0.4000 mg | Freq: Once | INTRAVENOUS | Status: DC
  Filled 2011-06-09: qty 5

## 2011-06-09 NOTE — Progress Notes (Signed)
Subjective:  Headache. Some chest pain during stress test  Objective:  Vital Signs in the last 24 hours: Temp:  [97.6 F (36.4 C)-98.4 F (36.9 C)] 97.7 F (36.5 C) (11/07 1458) Pulse Rate:  [51-72] 53  (11/07 1654) Resp:  [17-20] 20  (11/07 1458) BP: (120-174)/(56-76) 174/72 mmHg (11/07 1654) SpO2:  [96 %-99 %] 99 % (11/07 1458) Weight:  [92.4 kg (203 lb 11.3 oz)] 203 lb 11.3 oz (92.4 kg) (11/07 0244)  Intake/Output from previous day: 11/06 0701 - 11/07 0700 In: 430.7 [P.O.:240; I.V.:190.7] Out: 280 [Urine:280] Intake/Output from this shift: Total I/O In: 240 [P.O.:240] Out: 500 [Urine:500]  Physical Exam: General appearance: alert Neck: no adenopathy, no carotid bruit, no JVD, supple, symmetrical, trachea midline and thyroid not enlarged, symmetric, no tenderness/mass/nodules Lungs: clear to auscultation bilaterally Heart: regular rate and rhythm, S1, S2 normal, no murmur, click, rub or gallop Abdomen: soft, non-tender; bowel sounds normal; no masses,  no organomegaly Extremities: extremities normal, atraumatic, no cyanosis or edema Pulses: 2+ and symmetric Skin: Skin color, texture, turgor normal. No rashes or lesions Neurologic: Alert and oriented X 3, normal strength and tone. Normal symmetric reflexes. Normal coordination and gait  Lab Results:  Basename 06/09/11 0720 06/08/11 1350  WBC 8.5 7.7  HGB 11.4* 11.0*  PLT 265 256    Basename 06/08/11 1418  NA 144  K 3.8  CL 111  CO2 23  GLUCOSE 112*  BUN 23  CREATININE 1.00    Basename 06/09/11 0007 06/08/11 1748  TROPONINI <0.30 <0.30   Hepatic Function Panel  Basename 06/08/11 1418  PROT 6.7  ALBUMIN 3.5  AST 11  ALT 9  ALKPHOS 55  BILITOT 0.2*  BILIDIR --  IBILI --   No results found for this basename: CHOL in the last 72 hours No results found for this basename: PROTIME in the last 72 hours  Imaging: Imaging results have been reviewed  Cardiac Studies:  Assessment/Plan:  Chest  pain Native vessel coronary artery disease DM, II Hypertension Obesity   LOS: 1 day    Elantra Caprara S 06/09/2011, 6:42 PM

## 2011-06-09 NOTE — Progress Notes (Signed)
ANTICOAGULATION CONSULT NOTE - Follow Up Consult  Pharmacy Consult for Heparin Indication: chest pain/ACS  Allergies  Allergen Reactions  . Codeine Rash  . Penicillins Rash    Patient Measurements: Height: 5\' 9"  (175.3 cm) Weight: 203 lb 11.3 oz (92.4 kg) (scale c) IBW/kg (Calculated) : 66.2  Adjusted Body Weight:   Vital Signs: Temp: 97.6 F (36.4 C) (11/07 0503) Temp src: Axillary (11/07 0503) BP: 132/66 mmHg (11/07 0503) Pulse Rate: 56  (11/07 0503)  Labs:  Basename 06/09/11 0720 06/09/11 0007 06/08/11 1748 06/08/11 1418 06/08/11 1350  HGB 11.4* -- -- -- 11.0*  HCT 35.4* -- -- -- 34.1*  PLT 265 -- -- -- 256  APTT -- -- -- -- 31  LABPROT -- -- -- -- 14.9  INR -- -- -- -- 1.15  HEPARINUNFRC 0.87* 0.60 -- -- --  CREATININE -- -- -- 1.00 --  CKTOTAL -- 75 84 -- 93  CKMB -- 2.4 2.2 -- 2.3  TROPONINI -- <0.30 <0.30 -- <0.30   Estimated Creatinine Clearance: 59.8 ml/min (by C-G formula based on Cr of 1).   Medications:  See electronic MAR Heparin 1050 units/hr (10.5 ml/hr)  Assessment: 76yof on heparin for ACS/CP. Pt to have stress test today per admission note. Heparin level is supratherapeutic (0.87).  -H/H and plts stable -No bleeding reported  Goal of Therapy:  Heparin level 0.3-0.7 units/ml   Plan:  1. Decrease heparin drip to 900 units/hr (9 ml/hr). 2. Check heparin level 8hrs after rate decrease. 3. Follow-up results of stress test and anticoagulation plan.  Cleon Dew [phone] 06/09/2011,8:21 AM

## 2011-06-09 NOTE — Progress Notes (Signed)
Addendum: Dr. Algie Coffer writing discharge orders. Heparin drip is being stopped.  [name] [name] 06/09/2011,6:45 PM

## 2011-06-09 NOTE — Discharge Summary (Signed)
Physician Discharge Summary  Patient ID: [name] [name] MRN: [id number] DOB/AGE: [birth year] 74 y.o.  Admit date: 06/08/2011 Discharge date: 06/09/2011  Admission Diagnoses:  Chest pain, cardiac Coronary artery disease Diabetes Mellitus, II Hypertension, Benign Obesity  Discharge Diagnoses:  Principal Problem:  *Chest pain, cardiac Active Problems:  Coronary artery disease  Diabetes mellitus  HTN (hypertension), benign  Obesity (BMI 30-39.9)   Discharged Condition: stable  Hospital Course: Patient was admitted to telemetry bed, myocardial infarction was ruled out. She underwent nuclear stress test that failed to show reversible ischemia. She was discharged home with medications adjustments.  Consults: none  Significant Diagnostic Studies: nuclear medicine: no reversible ischemia.  Treatments: anticoagulation: ASA, Plavix and heparin  Discharge Exam: Blood pressure 174/72, pulse 53, temperature 97.7 F (36.5 C), temperature source Oral, resp. rate 20, height 5\' 9"  (1.753 m), weight 92.4 kg (203 lb 11.3 oz), SpO2 99.00%. General appearance: alert Head: Normocephalic, atraumatic Eyes: conjunctivae/corneas clear. PERRL, EOM's intact. Nose: Nares normal.  Throat: lips, mucosa, and tongue normal. Neck: no adenopathy, no carotid bruit, no JVD, supple, symmetrical, trachea midline and thyroid not enlarged Resp: clear to auscultation bilaterally Chest wall: no tenderness Cardio: regular rate and rhythm, S1, S2 normal, no murmur, click, rub or gallop GI: soft, non-tender; bowel sounds normal. Extremities:  no cyanosis or edema Skin: Warm and dry. Neurologic: Alert and oriented X 3, normal strength and tone. Cranial nerves grossly intact. Disposition:  Home  Discharge Orders    Future Orders Please Complete By Expires   Diet Carb Modified        Current Discharge Medication List    CONTINUE these medications which have NOT CHANGED   Details  amLODipine (NORVASC) 10 MG  tablet Take 10 mg by mouth daily.      aspirin EC 81 MG tablet Take 81 mg by mouth daily.      cloNIDine (CATAPRES) 0.1 MG tablet Take 0.1 mg by mouth 2 (two) times daily.      lisinopril (PRINIVIL,ZESTRIL) 10 MG tablet Take 10 mg by mouth daily.      metFORMIN (GLUCOPHAGE) 500 MG tablet Take 500 mg by mouth 2 (two) times daily with a meal.      metoprolol succinate (TOPROL-XL) 25 MG 24 hr tablet Take 25 mg by mouth daily.        STOP taking these medications     losartan (COZAAR) 25 MG tablet        Follow-up Information    Follow up with [facility] S, MD in 2 weeks.   Contact information:   [address] [place] [address] [phone]          Signed: Ricki Rodriguez 06/09/2011, 6:53 PM

## 2011-06-09 NOTE — Progress Notes (Signed)
ANTICOAGULATION CONSULT NOTE - Follow Up Consult  Pharmacy Consult for Heparin Indication: chest pain/ACS  Allergies  Allergen Reactions  . Codeine Rash  . Penicillins Rash    Patient Measurements: Height: 5\' 9"  (175.3 cm) Weight: 203 lb 11.3 oz (92.4 kg) (scale c) IBW/kg (Calculated) : 66.2  Adjusted Body Weight: 85.6 kg  Vital Signs: Temp: 97.7 F (36.5 C) (11/07 1458) Temp src: Oral (11/07 1458) BP: 174/72 mmHg (11/07 1654) Pulse Rate: 53  (11/07 1654)  Labs:  Basename 06/09/11 1620 06/09/11 0720 06/09/11 0007 06/08/11 1748 06/08/11 1418 06/08/11 1350  HGB -- 11.4* -- -- -- 11.0*  HCT -- 35.4* -- -- -- 34.1*  PLT -- 265 -- -- -- 256  APTT -- -- -- -- -- 31  LABPROT -- -- -- -- -- 14.9  INR -- -- -- -- -- 1.15  HEPARINUNFRC 0.34 0.87* 0.60 -- -- --  CREATININE -- -- -- -- 1.00 --  CKTOTAL -- -- 75 84 -- 93  CKMB -- -- 2.4 2.2 -- 2.3  TROPONINI -- -- <0.30 <0.30 -- <0.30   Estimated Creatinine Clearance: 59.8 ml/min (by C-G formula based on Cr of 1).   Assessment: Heparin level 0.34 on 900 units/hr. Low therapeutic.  Goal of Therapy:  Heparin level 0.3-0.7 units/ml   Plan:  Will increase heparin to 950 units/hr to try to keep level 0.3-0.7.  Next level and CBC in AM 11/8.   Dennie Fetters 06/09/2011,6:39 PM

## 2011-07-12 ENCOUNTER — Encounter: Payer: Self-pay | Admitting: *Deleted

## 2011-07-12 ENCOUNTER — Emergency Department (INDEPENDENT_AMBULATORY_CARE_PROVIDER_SITE_OTHER)
Admission: EM | Admit: 2011-07-12 | Discharge: 2011-07-12 | Disposition: A | Discharge status: Home Health | Payer: Medicare Other | Source: Home / Self Care

## 2011-07-12 DIAGNOSIS — J209 Acute bronchitis, unspecified: Secondary | ICD-10-CM

## 2011-07-12 HISTORY — DX: Essential (primary) hypertension: I10

## 2011-07-12 MED ORDER — DOXYCYCLINE HYCLATE 100 MG PO CAPS: 100.0000 mg | ORAL_CAPSULE | Freq: Two times a day (BID) | ORAL | Status: AC

## 2011-07-12 MED ORDER — ALBUTEROL SULFATE HFA 108 (90 BASE) MCG/ACT IN AERS: 2.0000 | INHALATION_SPRAY | RESPIRATORY_TRACT | Status: DC | PRN

## 2011-07-12 NOTE — ED Notes (Signed)
Pt c/o productive cough onset 2 weeks ago.  Has seen Dr. Renae Gloss and was given Tessalon, but states it has not helped.  States cough is productive of yellow sputum and is worse at night.  Denies fever.  States ribs and abd are sore from the coughing.  Also c/o throat hurting when she coughs.

## 2011-07-12 NOTE — ED Provider Notes (Signed)
History     CSN: [id number] Arrival date & time: 07/12/2011  9:02 AM   None     Chief Complaint  Patient presents with  . Cough    (Consider location/radiation/quality/duration/timing/severity/associated sxs/prior treatment) HPI Comments: Onset of cough 2 weeks ago. "It's mostly at night." Cough has become productive in the last week with yellow phlegm. Denies nasal congestion or post nasal drainage. Admits to HAs and sore throat. She saw Dr [name] [name] one month ago but not in the last 2 weeks since cough onset. Is taking Tessalon from a previous prescription. Does provide temporary relief of cough.   Patient is a 76 y.o. female presenting with cough. The history is provided by the patient.  Cough This is a new problem. The current episode started more than 1 week ago. The problem occurs every few minutes. The problem has been gradually worsening. The cough is productive of purulent sputum. There has been no fever. Associated symptoms include sore throat and wheezing. Pertinent negatives include no chest pain, no chills, no ear pain, no rhinorrhea, no myalgias and no shortness of breath. She has tried cough syrup for the symptoms. The treatment provided moderate relief. She is a smoker. Her past medical history is significant for pneumonia. Her past medical history does not include COPD or asthma.    Past Medical History  Diagnosis Date  . Hypertension   . Diabetes mellitus     Past Surgical History  Procedure Date  . Total knee arthroplasty   . Ankle surgery   . Abdominal hysterectomy     No family history on file.  History  Substance Use Topics  . Smoking status: Current Everyday Smoker -- 0.5 packs/day    Types: Cigarettes  . Smokeless tobacco: Not on file   Comment: Taking Wellbutrin  . Alcohol Use: No    OB History    Grav Para Term Preterm Abortions TAB SAB Ect Mult Living                  Review of Systems  Constitutional: Negative for fever and chills.    HENT: Positive for sore throat. Negative for ear pain, congestion, rhinorrhea and postnasal drip.   Respiratory: Positive for cough and wheezing. Negative for shortness of breath.   Cardiovascular: Negative for chest pain.  Musculoskeletal: Negative for myalgias.    Allergies  Codeine and Penicillins  Home Medications   Current Outpatient Rx  Name Route Sig Dispense Refill  . AMLODIPINE BESYLATE 10 MG PO TABS Oral Take 10 mg by mouth daily.      . ASPIRIN EC 81 MG PO TBEC Oral Take 81 mg by mouth daily.      . WELLBUTRIN PO Oral Take by mouth.      . CLONIDINE HCL 0.1 MG PO TABS Oral Take 0.1 mg by mouth 2 (two) times daily.      Marland Kitchen LOSARTAN POTASSIUM PO Oral Take by mouth.      . METFORMIN HCL 500 MG PO TABS Oral Take 500 mg by mouth 2 (two) times daily with a meal.      . METOPROLOL SUCCINATE ER 25 MG PO TB24 Oral Take 25 mg by mouth daily.      Marland Kitchen LISINOPRIL 10 MG PO TABS Oral Take 10 mg by mouth daily.        BP 133/67  Pulse 49  Temp(Src) 98.3 F (36.8 C) (Oral)  Resp 18  SpO2 99%  Physical Exam  Nursing note and vitals reviewed.  Constitutional: She appears well-developed and well-nourished. No distress.  HENT:  Head: Normocephalic and atraumatic.  Right Ear: Tympanic membrane, external ear and ear canal normal.  Left Ear: Tympanic membrane, external ear and ear canal normal.  Nose: Nose normal.  Mouth/Throat: Uvula is midline, oropharynx is clear and moist and mucous membranes are normal. No oropharyngeal exudate, posterior oropharyngeal edema or posterior oropharyngeal erythema.  Neck: Neck supple.  Cardiovascular: Normal rate, regular rhythm and normal heart sounds.   Pulmonary/Chest: Effort normal and breath sounds normal. No respiratory distress.  Lymphadenopathy:    She has no cervical adenopathy.  Neurological: She is alert.  Skin: Skin is warm and dry.  Psychiatric: She has a normal mood and affect.    ED Course  Procedures (including critical care  time)  Labs Reviewed - No data to display No results found.   No diagnosis found.    MDM          [name] Comas, PA 07/12/11 1030

## 2011-07-12 NOTE — ED Provider Notes (Signed)
Medical screening examination/treatment/procedure(s) were performed by non-physician practitioner and as supervising physician I was immediately available for consultation/collaboration.  Raynald Blend, MD 07/12/11 1124

## 2011-07-13 ENCOUNTER — Telehealth (HOSPITAL_COMMUNITY): Payer: Self-pay | Admitting: *Deleted

## 2011-07-20 ENCOUNTER — Encounter (HOSPITAL_COMMUNITY): Payer: Self-pay | Admitting: Emergency Medicine

## 2011-07-20 ENCOUNTER — Emergency Department (INDEPENDENT_AMBULATORY_CARE_PROVIDER_SITE_OTHER): Payer: Medicare Other

## 2011-07-20 ENCOUNTER — Emergency Department (INDEPENDENT_AMBULATORY_CARE_PROVIDER_SITE_OTHER)
Admission: EM | Admit: 2011-07-20 | Discharge: 2011-07-20 | Disposition: A | Discharge status: Home / Self Care | Payer: Medicare Other | Source: Home / Self Care | Attending: Family Medicine | Admitting: Family Medicine

## 2011-07-20 DIAGNOSIS — J398 Other specified diseases of upper respiratory tract: Secondary | ICD-10-CM

## 2011-07-20 DIAGNOSIS — J399 Disease of upper respiratory tract, unspecified: Secondary | ICD-10-CM

## 2011-07-20 MED ORDER — ACETAMINOPHEN 325 MG PO TABS
ORAL_TABLET | ORAL | Status: AC
  Filled 2011-07-20: qty 2

## 2011-07-20 MED ORDER — DEXTROMETHORPHAN POLISTIREX 30 MG/5ML PO LQCR: 60.0000 mg | Freq: Two times a day (BID) | ORAL | Status: AC

## 2011-07-20 NOTE — ED Notes (Signed)
Returned from xray

## 2011-07-20 NOTE — ED Notes (Signed)
Se[facility] at [facility], told she has bronchitis.  Not feeling any better, reports unable to get appt with her physician, so returned to [facility].  Denies any change in symptoms with exception of soreness around ribs

## 2011-07-20 NOTE — ED Provider Notes (Signed)
History     CSN: [id number] Arrival date & time: 07/20/2011 10:18 AM   First MD Initiated Contact with Patient 07/20/11 1036      Chief Complaint  Patient presents with  . Bronchitis    (Consider location/radiation/quality/duration/timing/severity/associated sxs/prior treatment) Patient is a 76 y.o. female presenting with cough. The history is provided by the patient.  Cough This is a new problem. Episode onset: 3weeks of cough, seen here at [facility] on 12/10 and given doxy, still coughing with yellow phlegm, smokes a little. The problem has not changed since onset.The cough is productive of sputum. There has been no fever. Associated symptoms include rhinorrhea and shortness of breath. She is a smoker. Her past medical history is significant for bronchitis and COPD.    Past Medical History  Diagnosis Date  . Hypertension   . Diabetes mellitus     Past Surgical History  Procedure Date  . Total knee arthroplasty   . Ankle surgery   . Abdominal hysterectomy     No family history on file.  History  Substance Use Topics  . Smoking status: Current Everyday Smoker -- 0.5 packs/day    Types: Cigarettes  . Smokeless tobacco: Not on file   Comment: Taking Wellbutrin  . Alcohol Use: No    OB History    Grav Para Term Preterm Abortions TAB SAB Ect Mult Living                  Review of Systems  Constitutional: Negative.   HENT: Positive for congestion and rhinorrhea.   Respiratory: Positive for cough and shortness of breath.   Gastrointestinal: Negative.   Skin: Negative.     Allergies  Codeine and Penicillins  Home Medications   Current Outpatient Rx  Name Route Sig Dispense Refill  . ALBUTEROL SULFATE HFA 108 (90 BASE) MCG/ACT IN AERS Inhalation Inhale 2 puffs into the lungs every 4 (four) hours as needed for wheezing. 1 Inhaler 0  . AMLODIPINE BESYLATE 10 MG PO TABS Oral Take 10 mg by mouth daily.      . ASPIRIN EC 81 MG PO TBEC Oral Take 81 mg by mouth daily.       . WELLBUTRIN PO Oral Take by mouth.      . CLONIDINE HCL 0.1 MG PO TABS Oral Take 0.1 mg by mouth 2 (two) times daily.      Marland Kitchen DEXTROMETHORPHAN POLISTIREX ER 30 MG/5ML PO LQCR Oral Take 10 mLs (60 mg total) by mouth 2 (two) times daily. 90 mL 1  . DOXYCYCLINE HYCLATE 100 MG PO CAPS Oral Take 1 capsule (100 mg total) by mouth 2 (two) times daily. 14 capsule 0  . LOSARTAN POTASSIUM PO Oral Take by mouth.      . METFORMIN HCL 500 MG PO TABS Oral Take 500 mg by mouth 2 (two) times daily with a meal.      . METOPROLOL SUCCINATE ER 25 MG PO TB24 Oral Take 25 mg by mouth daily.        BP 187/73  Pulse 61  Temp(Src) 97.4 F (36.3 C) (Oral)  Resp 20  SpO2 98%  Physical Exam  Constitutional: She appears well-developed and well-nourished.  HENT:  Head: Normocephalic.  Right Ear: External ear normal.  Left Ear: External ear normal.  Mouth/Throat: Oropharynx is clear and moist.  Eyes: Pupils are equal, round, and reactive to light.  Neck: Normal range of motion. Neck supple.  Cardiovascular: Normal rate, normal heart sounds and intact distal  pulses.   Pulmonary/Chest: Effort normal. She has rhonchi. She exhibits tenderness.  Abdominal: Soft. Normal appearance and bowel sounds are normal.  Lymphadenopathy:    She has no cervical adenopathy.  Skin: Skin is warm and dry.    ED Course  Procedures (including critical care time)  Labs Reviewed - No data to display Dg Chest 2 View  07/20/2011  *RADIOLOGY REPORT*  Clinical Data: Productive cough.  CHEST - 2 VIEW  Comparison: 09/02/2010  Findings: Heart size and vascularity are normal and the lungs are clear.  No significant osseous abnormality.  IMPRESSION: No acute disease.  Original Report Authenticated By: Gwynn Burly, M.D.     1. Upper respiratory disease       MDM  X-rays reviewed and report per radiologist.         Barkley Bruns, MD 07/20/11 1155

## 2012-07-31 ENCOUNTER — Encounter (HOSPITAL_COMMUNITY): Payer: Self-pay

## 2012-07-31 ENCOUNTER — Emergency Department (INDEPENDENT_AMBULATORY_CARE_PROVIDER_SITE_OTHER): Payer: BC Managed Care – PPO

## 2012-07-31 ENCOUNTER — Emergency Department (INDEPENDENT_AMBULATORY_CARE_PROVIDER_SITE_OTHER)
Admission: EM | Admit: 2012-07-31 | Discharge: 2012-07-31 | Disposition: A | Discharge status: Home / Self Care | Payer: BC Managed Care – PPO | Source: Home / Self Care | Attending: Family Medicine | Admitting: Family Medicine

## 2012-07-31 DIAGNOSIS — J40 Bronchitis, not specified as acute or chronic: Secondary | ICD-10-CM

## 2012-07-31 MED ORDER — BENZONATATE 100 MG PO CAPS: 100.0000 mg | ORAL_CAPSULE | Freq: Three times a day (TID) | ORAL | Status: DC

## 2012-07-31 MED ORDER — ALBUTEROL SULFATE HFA 108 (90 BASE) MCG/ACT IN AERS: 1.0000 | INHALATION_SPRAY | Freq: Four times a day (QID) | RESPIRATORY_TRACT | Status: DC | PRN

## 2012-07-31 MED ORDER — DOXYCYCLINE HYCLATE 100 MG PO CAPS: 100.0000 mg | ORAL_CAPSULE | Freq: Two times a day (BID) | ORAL | Status: DC

## 2012-07-31 MED ORDER — PREDNISONE 20 MG PO TABS: ORAL_TABLET | ORAL | Status: DC

## 2012-07-31 NOTE — ED Provider Notes (Signed)
History     CSN: [id number]  Arrival date & time 07/31/12  1032   First MD Initiated Contact with Patient 07/31/12 1228      Chief Complaint  Patient presents with  . Cough    (Consider location/radiation/quality/duration/timing/severity/associated sxs/prior treatment) HPI Comments: 77 year old smoker female with history of hypertension and diabetes. Here complaining of productive cough with yellow sputum, nasal congestion for 4 days. Appetite is slightly decreased. The patient reports tolerating fluids and solids well. Denies fever or chills. Denies pleuritic type of chest pain or any type of chest discomfort currently. No diaphoresis, No paroxysmal nocturnal dyspnea. No leg swelling. Taking over-the-counter medications for cough without significant improvement. Denies headache or dizziness. No palpitations. Patient still smoking but trying to quit was recently started on Wellbutrin by her primary care provider to help her to quit smoking. No abdominal pain nausea vomiting or diarrhea.   Past Medical History  Diagnosis Date  . Hypertension   . Diabetes mellitus     Past Surgical History  Procedure Date  . Total knee arthroplasty   . Ankle surgery   . Abdominal hysterectomy     History reviewed. No pertinent family history.  History  Substance Use Topics  . Smoking status: Current Every Day Smoker -- 0.5 packs/day    Types: Cigarettes  . Smokeless tobacco: Not on file     Comment: Taking Wellbutrin  . Alcohol Use: No    OB History    Grav Para Term Preterm Abortions TAB SAB Ect Mult Living                  Review of Systems  Constitutional: Negative for fever, chills, diaphoresis, activity change, appetite change and fatigue.  HENT: Positive for congestion and rhinorrhea. Negative for ear pain, sore throat and neck pain.   Eyes: Negative for discharge.  Respiratory: Positive for cough. Negative for chest tightness and shortness of breath.   Cardiovascular:  Negative for chest pain, palpitations and leg swelling.  Gastrointestinal: Negative for nausea, vomiting, abdominal pain and diarrhea.  Musculoskeletal: Negative for myalgias, back pain and arthralgias.  Skin: Negative for rash.  Neurological: Negative for dizziness and headaches.    Allergies  Codeine and Penicillins  Home Medications   Current Outpatient Rx  Name  Route  Sig  Dispense  Refill  . ALBUTEROL SULFATE HFA 108 (90 BASE) MCG/ACT IN AERS   Inhalation   Inhale 1-2 puffs into the lungs every 6 (six) hours as needed for wheezing.   1 Inhaler   0   . AMLODIPINE BESYLATE 10 MG PO TABS   Oral   Take 10 mg by mouth daily.           . ASPIRIN EC 81 MG PO TBEC   Oral   Take 81 mg by mouth daily.           Marland Kitchen BENZONATATE 100 MG PO CAPS   Oral   Take 1 capsule (100 mg total) by mouth every 8 (eight) hours.   21 capsule   0   . WELLBUTRIN PO   Oral   Take by mouth.           . CLONIDINE HCL 0.1 MG PO TABS   Oral   Take 0.1 mg by mouth 2 (two) times daily.           Marland Kitchen DOXYCYCLINE HYCLATE 100 MG PO CAPS   Oral   Take 1 capsule (100 mg total) by mouth 2 (two)  times daily.   20 capsule   0   . LOSARTAN POTASSIUM PO   Oral   Take by mouth.           . METFORMIN HCL 500 MG PO TABS   Oral   Take 500 mg by mouth 2 (two) times daily with a meal.           . METOPROLOL SUCCINATE ER 25 MG PO TB24   Oral   Take 25 mg by mouth daily.           Marland Kitchen PREDNISONE 20 MG PO TABS      Take  4 tabs by mouth on day #1 then,  3 tabs on day#2,      2 tbs on day#3,      1 tab on  Day#4,       1/2 tab po on day#5   11 tablet   0     BP 168/74  Pulse 53  Temp 97.6 F (36.4 C) (Oral)  Resp 14  SpO2 97%  Physical Exam  Nursing note and vitals reviewed. Constitutional: She is oriented to person, place, and time. She appears well-developed and well-nourished. No distress.  HENT:  Head: Normocephalic and atraumatic.       Nasal Congestion with erythema and  swelling of nasal turbinates, clear rhinorrhea. No pharyngeal erythema no exudates. No uvula deviation. No trismus. TM's normal.   Eyes: Conjunctivae normal and EOM are normal. Pupils are equal, round, and reactive to light. Right eye exhibits no discharge. Left eye exhibits no discharge.  Neck: Neck supple. No JVD present. No thyromegaly present.  Cardiovascular: Normal rate, regular rhythm and normal heart sounds.  Exam reveals no gallop and no friction rub.   No murmur heard.      No LEE  Pulmonary/Chest:       No orthopnea or tachypnea. Scattered sporadic expiratory ronchi bilaterally. Bronchitic cough.  Lymphadenopathy:    She has no cervical adenopathy.  Neurological: She is alert and oriented to person, place, and time.  Skin: No rash noted. She is not diaphoretic.    ED Course  Procedures (including critical care time)  Labs Reviewed - No data to display Dg Chest 2 View  07/31/2012  *RADIOLOGY REPORT*  Clinical Data: Cough and congestion  CHEST - 2 VIEW  Comparison: 07/20/2011  Findings: The lungs are clear without focal infiltrate, edema, pneumothorax or pleural effusion. Interstitial markings are diffusely coarsened with chronic features. Cardiopericardial silhouette is at upper limits of normal for size. Imaged bony structures of the thorax are intact.  IMPRESSION: Stable.  Borderline cardiomegaly with some chronic interstitial coarsening.  No acute cardiopulmonary process.   Original Report Authenticated By: Kennith Center, M.D.      1. Bronchitis       MDM  Clinically well. No active wheezing or acute respiratory distress. No infiltrates/consolidations on x-rays. Treated with prednisone, albuterol, doxycycline and Tessalon Perles. Asked to followup in 5-7 days with her primary care provider or return earlier to the emergency department if new worsening symptoms despite following treatment. Supportive care and retroflexed should prompt her return to medical attention  discussed in detail with patient and provided in writing.       Sharin Grave, MD 08/02/12 [phone]

## 2012-07-31 NOTE — ED Notes (Signed)
C/o cough, congestion ( yellow secretions) x 4 days; minimal relief w OTC medications

## 2013-05-31 ENCOUNTER — Other Ambulatory Visit: Payer: Self-pay | Admitting: *Deleted

## 2013-05-31 DIAGNOSIS — R0989 Other specified symptoms and signs involving the circulatory and respiratory systems: Secondary | ICD-10-CM

## 2013-07-11 ENCOUNTER — Encounter: Payer: Self-pay | Admitting: Vascular Surgery

## 2013-07-12 ENCOUNTER — Encounter (INDEPENDENT_AMBULATORY_CARE_PROVIDER_SITE_OTHER): Payer: Self-pay

## 2013-07-12 ENCOUNTER — Ambulatory Visit (HOSPITAL_COMMUNITY)
Admission: RE | Admit: 2013-07-12 | Discharge: 2013-07-12 | Disposition: A | Discharge status: Home / Self Care | Payer: Medicare Other | Source: Ambulatory Visit | Attending: Vascular Surgery | Admitting: Vascular Surgery

## 2013-07-12 ENCOUNTER — Ambulatory Visit (INDEPENDENT_AMBULATORY_CARE_PROVIDER_SITE_OTHER): Payer: Medicare Other | Admitting: Vascular Surgery

## 2013-07-12 ENCOUNTER — Encounter: Payer: Self-pay | Admitting: Vascular Surgery

## 2013-07-12 VITALS — BP 164/62 | HR 50 | Ht 69.0 in | Wt 199.0 lb

## 2013-07-12 DIAGNOSIS — R0989 Other specified symptoms and signs involving the circulatory and respiratory systems: Secondary | ICD-10-CM | POA: Insufficient documentation

## 2013-07-12 DIAGNOSIS — I739 Peripheral vascular disease, unspecified: Secondary | ICD-10-CM

## 2013-07-12 DIAGNOSIS — I70219 Atherosclerosis of native arteries of extremities with intermittent claudication, unspecified extremity: Secondary | ICD-10-CM | POA: Insufficient documentation

## 2013-07-12 NOTE — Progress Notes (Signed)
[facility] HISTORY AND PHYSICAL   History of Present Illness:  Patient is a 78 y.o. year old female who presents for evaluation of lower extremity pain. She complains that her left ankle has been swelling for several years. She occasionally gets steroid injections in her left ankle by Dr. Lajoyce Corners for pain. She denies claudication symptoms. She denies rest pain. She does occasionally get cramps in both of her feet at night time. She has no history of nonhealing wounds. She is a one pack per day smoker. Greater than 3 minutes they spent regarding smoking cessation counseling.  Other medical problems include hypertension, diabetes prior history of stroke. These are all currently stable.  Past Medical History  Diagnosis Date  . Hypertension   . Diabetes mellitus   . Stroke     Past Surgical History  Procedure Laterality Date  . Total knee arthroplasty    . Ankle surgery    . Abdominal hysterectomy      Social History History  Substance Use Topics  . Smoking status: Current Every Day Smoker -- 0.50 packs/day for 12 years    Types: Cigarettes  . Smokeless tobacco: Never Used     Comment: Taking Wellbutrin  . Alcohol Use: No    Family History Family History  Problem Relation Age of Onset  . Diabetes Mother   . Diabetes Sister   . Hyperlipidemia Sister   . Diabetes Brother   . Diabetes Son     Allergies  Allergies  Allergen Reactions  . Codeine Rash  . Penicillins Rash     Current Outpatient Prescriptions  Medication Sig Dispense Refill  . albuterol (PROVENTIL HFA;VENTOLIN HFA) 108 (90 BASE) MCG/ACT inhaler Inhale 1-2 puffs into the lungs every 6 (six) hours as needed for wheezing.  1 Inhaler  0  . ALPRAZolam (XANAX) 0.25 MG tablet Take 1 tablet by mouth as needed.      Marland Kitchen amLODipine (NORVASC) 10 MG tablet Take 10 mg by mouth daily.        Marland Kitchen aspirin EC 81 MG tablet Take 81 mg by mouth daily.        . benzonatate (TESSALON) 100 MG capsule Take 1  capsule (100 mg total) by mouth every 8 (eight) hours.  21 capsule  0  . BuPROPion HCl (WELLBUTRIN PO) Take by mouth.        . cloNIDine (CATAPRES) 0.1 MG tablet Take 0.1 mg by mouth 2 (two) times daily.        Marland Kitchen doxycycline (VIBRAMYCIN) 100 MG capsule Take 1 capsule (100 mg total) by mouth 2 (two) times daily.  20 capsule  0  . LOSARTAN POTASSIUM PO Take by mouth.        . metFORMIN (GLUCOPHAGE) 500 MG tablet Take 500 mg by mouth 2 (two) times daily with a meal.        . metoprolol succinate (TOPROL-XL) 25 MG 24 hr tablet Take 25 mg by mouth daily.        . pioglitazone (ACTOS) 30 MG tablet Take 1 tablet by mouth daily.      . predniSONE (DELTASONE) 20 MG tablet Take  4 tabs by mouth on day #1 then,  3 tabs on day#2,      2 tbs on day#3,      1 tab on  Day#4,       1/2 tab po on day#5  11 tablet  0   No current facility-administered medications for this visit.  ROS:   General:  No weight loss, Fever, chills  HEENT: No recent headaches, no nasal bleeding, no visual changes, no sore throat  Neurologic: No dizziness, blackouts, seizures. No recent symptoms of stroke or mini- stroke. No recent episodes of slurred speech, or temporary blindness.  Cardiac: No recent episodes of chest pain/pressure but she does get this occasionally, no shortness of breath at rest.  No shortness of breath with exertion.  Denies history of atrial fibrillation or irregular heartbeat  Vascular: No history of rest pain in feet.  No history of claudication.  No history of non-healing ulcer, No history of DVT   Pulmonary: No home oxygen, no productive cough, no hemoptysis,  No asthma or wheezing  Musculoskeletal:  [x ] Arthritis, [ ]  Low back pain,  [ ]  Joint pain  Hematologic:No history of hypercoagulable state.  No history of easy bleeding.  No history of anemia  Gastrointestinal: No hematochezia or melena,  No gastroesophageal reflux, no trouble swallowing  Urinary: [ ]  chronic Kidney disease, [ ]  on HD - [  ] MWF or [ ]  TTHS, [ ]  Burning with urination, [ ]  Frequent urination, [ ]  Difficulty urinating;   Skin: No rashes  Psychological: No history of anxiety,  No history of depression   Physical Examination  Filed Vitals:   07/12/13 1017  BP: 164/62  Pulse: 50  Height: 5\' 9"  (1.753 m)  Weight: 199 lb (90.266 kg)  SpO2: 100%    Body mass index is 29.37 kg/(m^2).  General:  Alert and oriented, no acute distress HEENT: Normal Neck: No bruit or JVD Pulmonary: Clear to auscultation bilaterally Cardiac: Regular Rate and Rhythm without murmur Abdomen: Soft, non-tender, non-distended, no mass, no scars Skin: No rash Extremity Pulses:  2+ radial, brachial, femoral absent dorsalis pedis, posterior tibial pulses bilaterally Musculoskeletal: No deformity or edema  Neurologic: Upper and lower extremity motor 5/5 and symmetric  DATA:  Patient had bilateral ABIs performed today right was 0.99 left 0.90 toe pressure on the right 84 to pressure on the left 74 waveforms were triphasic to biphasic   ASSESSMENT:  Left ankle swelling with bilateral foot cramps at night time. The patient does not have claudication symptoms. Her noninvasive vascular exam today was close to normal. The reason for nonpalpable pulses maybe some vessel calcification. However I would not say that her current leg symptoms are related to peripheral arterial disease.   PLAN:  The patient will continue to try to quit smoking. She will followup with [place] with repeat ABIs and duplex scan in 1 year. If she develops a tight sensation in her with walking this would be more consistent with claudication.  Fabienne Bruns, MD [facility] [place] [place] Office: [phone] Pager: [phone]

## 2013-07-28 ENCOUNTER — Emergency Department (INDEPENDENT_AMBULATORY_CARE_PROVIDER_SITE_OTHER)
Admission: EM | Admit: 2013-07-28 | Discharge: 2013-07-28 | Disposition: A | Discharge status: Home / Self Care | Payer: Medicare Other | Source: Home / Self Care | Attending: Emergency Medicine | Admitting: Emergency Medicine

## 2013-07-28 ENCOUNTER — Encounter (HOSPITAL_COMMUNITY): Payer: Self-pay | Admitting: Emergency Medicine

## 2013-07-28 DIAGNOSIS — J069 Acute upper respiratory infection, unspecified: Secondary | ICD-10-CM

## 2013-07-28 MED ORDER — BENZONATATE 200 MG PO CAPS: 200.0000 mg | ORAL_CAPSULE | Freq: Three times a day (TID) | ORAL | Status: DC | PRN

## 2013-07-28 MED ORDER — ALBUTEROL SULFATE HFA 108 (90 BASE) MCG/ACT IN AERS: 2.0000 | INHALATION_SPRAY | Freq: Four times a day (QID) | RESPIRATORY_TRACT | Status: DC

## 2013-07-28 NOTE — ED Provider Notes (Signed)
Chief Complaint:   Chief Complaint  Patient presents with  . Cough    History of Present Illness:   [name] [name] is a 78 year old female who has had a two-day history of cough productive yellow sputum, mild shortness of breath, stomach cramps, diarrhea, left ear pain, nasal congestion, sneezing, rhinorrhea with clear drainage, headache, and sore throat. She had a subjective fever on December 22 which was 5 days ago but none since that. The stomach cramps and diarrhea have resolved. She still left with a cough and nasal congestion. She denies any sick exposures. She has had no chest pain, and she denies any vomiting.  Review of Systems:  Other than noted above, the patient denies any of the following symptoms: Systemic:  No fevers, chills, sweats, weight loss or gain, fatigue, or tiredness. Eye:  No redness or discharge. ENT:  No ear pain, drainage, headache, nasal congestion, drainage, sinus pressure, difficulty swallowing, or sore throat. Neck:  No neck pain or swollen glands. Lungs:  No cough, sputum production, hemoptysis, wheezing, chest tightness, shortness of breath or chest pain. GI:  No abdominal pain, nausea, vomiting or diarrhea.  PMFSH:  Past medical history, family history, social history, meds, and allergies were reviewed. She is allergic to penicillin codeine. Current meds include Xanax, Norvasc, aspirin, Wellbutrin, Catapres, losartan, metformin, metoprolol, and Actos. She has hypertension, diabetes, a history of stroke, and a history of allergies. She is currently smoking cigarettes.  Physical Exam:   Vital signs:  BP 140/64  Pulse 56  Temp(Src) 98.3 F (36.8 C) (Oral)  Resp 18  SpO2 100% General:  Alert and oriented.  In no distress.  Skin warm and dry. Eye:  No conjunctival injection or drainage. Lids were normal. ENT:  TMs were retracted but without any air-fluid level or inflammation, canals were clear.  Nasal mucosa was clear and uncongested, without drainage.   Mucous membranes were moist.  Pharynx was clear with no exudate or drainage.  There were no oral ulcerations or lesions. Neck:  Supple, no adenopathy, tenderness or mass. Lungs:  No respiratory distress.  Lungs were clear to auscultation, without wheezes, rales or rhonchi.  Breath sounds were clear and equal bilaterally.  Heart:  Regular rhythm, without gallops, murmers or rubs. Skin:  Clear, warm, and dry, without rash or lesions.  Assessment:  The encounter diagnosis was Viral upper respiratory infection.  No indication for antibiotics.  Plan:   1.  Meds:  The following meds were prescribed:   Discharge Medication List as of 07/28/2013  2:13 PM    START taking these medications   Details  !! albuterol (PROVENTIL HFA;VENTOLIN HFA) 108 (90 BASE) MCG/ACT inhaler Inhale 2 puffs into the lungs 4 (four) times daily., Starting 07/28/2013, Until Discontinued, Normal    benzonatate (TESSALON) 200 MG capsule Take 1 capsule (200 mg total) by mouth 3 (three) times daily as needed for cough., Starting 07/28/2013, Until Discontinued, Normal     !! - Potential duplicate medications found. Please discuss with provider.      2.  Patient Education/Counseling:  The patient was given appropriate handouts, self care instructions, and instructed in symptomatic relief.  Encouraged to get extra rest and fluids.  3.  Follow up:  The patient was told to follow up if no better in 3 to 4 days, if becoming worse in any way, and given some red flag symptoms such as fever or difficulty breathing which would prompt immediate return.  Follow up here as necessary.  Reuben Likes, MD 07/28/13 [phone]

## 2013-07-28 NOTE — ED Notes (Signed)
C/o cough and cold sx since 12/22

## 2013-08-02 NOTE — ED Notes (Signed)
Chart review.

## 2014-05-01 ENCOUNTER — Other Ambulatory Visit (HOSPITAL_COMMUNITY): Payer: Self-pay | Admitting: Cardiovascular Disease

## 2014-05-01 DIAGNOSIS — R079 Chest pain, unspecified: Secondary | ICD-10-CM

## 2014-05-22 ENCOUNTER — Encounter (HOSPITAL_COMMUNITY): Payer: Medicare Other

## 2014-05-22 ENCOUNTER — Encounter (HOSPITAL_COMMUNITY): Admission: RE | Admit: 2014-05-22 | Payer: Medicare Other | Source: Ambulatory Visit

## 2014-06-18 ENCOUNTER — Encounter (HOSPITAL_COMMUNITY): Payer: Medicare Other

## 2014-06-18 ENCOUNTER — Encounter (HOSPITAL_COMMUNITY): Admission: RE | Admit: 2014-06-18 | Payer: Medicare Other | Source: Ambulatory Visit

## 2014-06-18 ENCOUNTER — Encounter (HOSPITAL_COMMUNITY)
Admission: RE | Admit: 2014-06-18 | Discharge: 2014-06-18 | Disposition: A | Discharge status: Home / Self Care | Payer: Medicare Other | Source: Ambulatory Visit | Attending: Cardiovascular Disease | Admitting: Cardiovascular Disease

## 2014-06-18 MED ORDER — REGADENOSON 0.4 MG/5ML IV SOLN: 0.4000 mg | Freq: Once | INTRAVENOUS | Status: DC

## 2014-07-11 ENCOUNTER — Ambulatory Visit: Payer: BC Managed Care – PPO | Admitting: Family

## 2014-07-11 ENCOUNTER — Other Ambulatory Visit (HOSPITAL_COMMUNITY): Payer: BC Managed Care – PPO

## 2014-07-11 ENCOUNTER — Encounter (HOSPITAL_COMMUNITY): Payer: BC Managed Care – PPO

## 2014-08-19 ENCOUNTER — Encounter: Payer: Self-pay | Admitting: Family

## 2014-08-20 ENCOUNTER — Encounter (HOSPITAL_COMMUNITY): Payer: Medicare Other

## 2014-08-20 ENCOUNTER — Other Ambulatory Visit (HOSPITAL_COMMUNITY): Payer: Self-pay

## 2014-08-20 ENCOUNTER — Ambulatory Visit: Payer: Self-pay | Admitting: Family

## 2014-10-03 ENCOUNTER — Other Ambulatory Visit (HOSPITAL_COMMUNITY): Payer: Self-pay | Admitting: Cardiovascular Disease

## 2014-10-03 DIAGNOSIS — R079 Chest pain, unspecified: Secondary | ICD-10-CM

## 2014-10-04 ENCOUNTER — Encounter (HOSPITAL_COMMUNITY): Payer: Medicare Other

## 2014-10-07 ENCOUNTER — Encounter (HOSPITAL_COMMUNITY)
Admission: RE | Admit: 2014-10-07 | Discharge: 2014-10-07 | Disposition: A | Discharge status: Home / Self Care | Payer: Medicare Other | Source: Ambulatory Visit | Attending: Cardiovascular Disease | Admitting: Cardiovascular Disease

## 2014-10-07 ENCOUNTER — Other Ambulatory Visit: Payer: Self-pay

## 2014-10-07 DIAGNOSIS — R079 Chest pain, unspecified: Secondary | ICD-10-CM | POA: Diagnosis not present

## 2014-10-07 MED ORDER — REGADENOSON 0.4 MG/5ML IV SOLN
INTRAVENOUS | Status: AC
  Filled 2014-10-07: qty 5

## 2014-10-07 MED ORDER — TECHNETIUM TC 99M SESTAMIBI GENERIC - CARDIOLITE
10.0000 | Freq: Once | INTRAVENOUS | Status: AC | PRN
  Administered 2014-10-07: 10 via INTRAVENOUS

## 2014-10-07 MED ORDER — REGADENOSON 0.4 MG/5ML IV SOLN
0.4000 mg | Freq: Once | INTRAVENOUS | Status: AC
  Administered 2014-10-07: 0.4 mg via INTRAVENOUS

## 2014-10-07 MED ORDER — TECHNETIUM TC 99M SESTAMIBI GENERIC - CARDIOLITE
30.0000 | Freq: Once | INTRAVENOUS | Status: AC | PRN
  Administered 2014-10-07: 30 via INTRAVENOUS

## 2014-11-03 ENCOUNTER — Emergency Department (HOSPITAL_COMMUNITY)
Admission: EM | Admit: 2014-11-03 | Discharge: 2014-11-03 | Disposition: A | Discharge status: Home / Self Care | Payer: Medicare Other | Attending: Emergency Medicine | Admitting: Emergency Medicine

## 2014-11-03 ENCOUNTER — Encounter (HOSPITAL_COMMUNITY): Payer: Self-pay

## 2014-11-03 ENCOUNTER — Emergency Department (HOSPITAL_COMMUNITY): Payer: Medicare Other

## 2014-11-03 DIAGNOSIS — J069 Acute upper respiratory infection, unspecified: Secondary | ICD-10-CM | POA: Insufficient documentation

## 2014-11-03 DIAGNOSIS — Z79899 Other long term (current) drug therapy: Secondary | ICD-10-CM | POA: Insufficient documentation

## 2014-11-03 DIAGNOSIS — I10 Essential (primary) hypertension: Secondary | ICD-10-CM | POA: Insufficient documentation

## 2014-11-03 DIAGNOSIS — Z8673 Personal history of transient ischemic attack (TIA), and cerebral infarction without residual deficits: Secondary | ICD-10-CM | POA: Diagnosis not present

## 2014-11-03 DIAGNOSIS — R05 Cough: Secondary | ICD-10-CM | POA: Diagnosis present

## 2014-11-03 DIAGNOSIS — Z7982 Long term (current) use of aspirin: Secondary | ICD-10-CM | POA: Insufficient documentation

## 2014-11-03 DIAGNOSIS — E119 Type 2 diabetes mellitus without complications: Secondary | ICD-10-CM | POA: Insufficient documentation

## 2014-11-03 DIAGNOSIS — Z880 Allergy status to penicillin: Secondary | ICD-10-CM | POA: Diagnosis not present

## 2014-11-03 DIAGNOSIS — Z720 Tobacco use: Secondary | ICD-10-CM | POA: Insufficient documentation

## 2014-11-03 MED ORDER — GUAIFENESIN-DM 100-10 MG/5ML PO SYRP: 5.0000 mL | ORAL_SOLUTION | Freq: Three times a day (TID) | ORAL | Status: DC | PRN

## 2014-11-03 MED ORDER — ALBUTEROL SULFATE HFA 108 (90 BASE) MCG/ACT IN AERS
2.0000 | INHALATION_SPRAY | RESPIRATORY_TRACT | Status: DC | PRN
  Administered 2014-11-03: 2 via RESPIRATORY_TRACT
  Filled 2014-11-03: qty 6.7

## 2014-11-03 NOTE — ED Notes (Signed)
Pt reports cough x 2 weeks with sneezing. States she was dx with bronchitis two weeks ago, started on Z pack but reports no relief. Denies fever/chills. Reports mild SOB. NAd.

## 2014-11-03 NOTE — Discharge Instructions (Signed)
Upper Respiratory Infection, Adult An upper respiratory infection (URI) is also sometimes known as the common cold. The upper respiratory tract includes the nose, sinuses, throat, trachea, and bronchi. Bronchi are the airways leading to the lungs. Most people improve within 1 week, but symptoms can last up to 2 weeks. A residual cough may last even longer.  CAUSES Many different viruses can infect the tissues lining the upper respiratory tract. The tissues become irritated and inflamed and often become very moist. Mucus production is also common. A cold is contagious. You can easily spread the virus to others by oral contact. This includes kissing, sharing a glass, coughing, or sneezing. Touching your mouth or nose and then touching a surface, which is then touched by another person, can also spread the virus. SYMPTOMS  Symptoms typically develop 1 to 3 days after you come in contact with a cold virus. Symptoms vary from person to person. They may include:  Runny nose.  Sneezing.  Nasal congestion.  Sinus irritation.  Sore throat.  Loss of voice (laryngitis).  Cough.  Fatigue.  Muscle aches.  Loss of appetite.  Headache.  Low-grade fever. DIAGNOSIS  You might diagnose your own cold based on familiar symptoms, since most people get a cold 2 to 3 times a year. Your caregiver can confirm this based on your exam. Most importantly, your caregiver can check that your symptoms are not due to another disease such as strep throat, sinusitis, pneumonia, asthma, or epiglottitis. Blood tests, throat tests, and X-rays are not necessary to diagnose a common cold, but they may sometimes be helpful in excluding other more serious diseases. Your caregiver will decide if any further tests are required. RISKS AND COMPLICATIONS  You may be at risk for a more severe case of the common cold if you smoke cigarettes, have chronic heart disease (such as heart failure) or lung disease (such as asthma), or if  you have a weakened immune system. The very young and very old are also at risk for more serious infections. Bacterial sinusitis, middle ear infections, and bacterial pneumonia can complicate the common cold. The common cold can worsen asthma and chronic obstructive pulmonary disease (COPD). Sometimes, these complications can require emergency medical care and may be life-threatening. PREVENTION  The best way to protect against getting a cold is to practice good hygiene. Avoid oral or hand contact with people with cold symptoms. Wash your hands often if contact occurs. There is no clear evidence that vitamin C, vitamin E, echinacea, or exercise reduces the chance of developing a cold. However, it is always recommended to get plenty of rest and practice good nutrition. TREATMENT  Treatment is directed at relieving symptoms. There is no cure. Antibiotics are not effective, because the infection is caused by a virus, not by bacteria. Treatment may include:  Increased fluid intake. Sports drinks offer valuable electrolytes, sugars, and fluids.  Breathing heated mist or steam (vaporizer or shower).  Eating chicken soup or other clear broths, and maintaining good nutrition.  Getting plenty of rest.  Using gargles or lozenges for comfort.  Controlling fevers with ibuprofen or acetaminophen as directed by your caregiver.  Increasing usage of your inhaler if you have asthma. Zinc gel and zinc lozenges, taken in the first 24 hours of the common cold, can shorten the duration and lessen the severity of symptoms. Pain medicines may help with fever, muscle aches, and throat pain. A variety of non-prescription medicines are available to treat congestion and runny nose. Your caregiver   can make recommendations and may suggest nasal or lung inhalers for other symptoms.  HOME CARE INSTRUCTIONS   Only take over-the-counter or prescription medicines for pain, discomfort, or fever as directed by your  caregiver.  Use a warm mist humidifier or inhale steam from a shower to increase air moisture. This may keep secretions moist and make it easier to breathe.  Drink enough water and fluids to keep your urine clear or pale yellow.  Rest as needed.  Return to work when your temperature has returned to normal or as your caregiver advises. You may need to stay home longer to avoid infecting others. You can also use a face mask and careful hand washing to prevent spread of the virus. SEEK MEDICAL CARE IF:   After the first few days, you feel you are getting worse rather than better.  You need your caregiver's advice about medicines to control symptoms.  You develop chills, worsening shortness of breath, or brown or red sputum. These may be signs of pneumonia.  You develop yellow or brown nasal discharge or pain in the face, especially when you bend forward. These may be signs of sinusitis.  You develop a fever, swollen neck glands, pain with swallowing, or white areas in the back of your throat. These may be signs of strep throat. SEEK IMMEDIATE MEDICAL CARE IF:   You have a fever.  You develop severe or persistent headache, ear pain, sinus pain, or chest pain.  You develop wheezing, a prolonged cough, cough up blood, or have a change in your usual mucus (if you have chronic lung disease).  You develop sore muscles or a stiff neck. Document Released: 01/12/2001 Document Revised: 10/11/2011 Document Reviewed: 10/24/2013 [employer] Patient Information 2015 [employer]. This information is not intended to replace advice given to you by your health care provider. Make sure you discuss any questions you have with your health care provider.  

## 2014-11-03 NOTE — ED Provider Notes (Signed)
CSN: [id number]     Arrival date & time 11/03/14  3662 History   First MD Initiated Contact with Patient 11/03/14 [phone]     Chief Complaint  Patient presents with  . Cough     (Consider location/radiation/quality/duration/timing/severity/associated sxs/prior Treatment) Patient is a 80 y.o. female presenting with cough. The history is provided by the patient.  Cough Associated symptoms: no chest pain, no headaches, no rash and no shortness of breath    patient with a cough for the last 2 weeks. Has had some mild white production. Was seen by her PCP and states she was told she had bronchitis that was almost pneumonia and she was given a Z-Pak. States she got a little bit better but has continued to cough. States it got worse yesterday. No fevers. States she went to a cookout yesterday and has had more coughing since. No fevers. No abdominal pain. States she does have some slight tightness in her chest when she coughs. She is a smoker.  Past Medical History  Diagnosis Date  . Hypertension   . Diabetes mellitus   . Stroke    Past Surgical History  Procedure Laterality Date  . Total knee arthroplasty    . Ankle surgery    . Abdominal hysterectomy     Family History  Problem Relation Age of Onset  . Diabetes Mother   . Diabetes Sister   . Hyperlipidemia Sister   . Diabetes Brother   . Diabetes Son    History  Substance Use Topics  . Smoking status: Current Every Day Smoker -- 0.50 packs/day for 12 years    Types: Cigarettes  . Smokeless tobacco: Never Used     Comment: Taking Wellbutrin  . Alcohol Use: No   OB History    No data available     Review of Systems  Constitutional: Negative for activity change and appetite change.  HENT: Positive for sneezing.   Eyes: Negative for pain.  Respiratory: Positive for cough. Negative for chest tightness and shortness of breath.   Cardiovascular: Negative for chest pain and leg swelling.  Gastrointestinal: Negative for nausea,  vomiting, abdominal pain and diarrhea.  Genitourinary: Negative for flank pain.  Musculoskeletal: Negative for back pain and neck stiffness.  Skin: Negative for rash.  Neurological: Negative for weakness, numbness and headaches.  Psychiatric/Behavioral: Negative for behavioral problems.      Allergies  Codeine and Penicillins  Home Medications   Prior to Admission medications   Medication Sig Start Date End Date Taking? Authorizing Provider  albuterol (PROVENTIL HFA;VENTOLIN HFA) 108 (90 BASE) MCG/ACT inhaler Inhale 2 puffs into the lungs 4 (four) times daily. 07/28/13  Yes Harden Mo, MD  ALPRAZolam Duanne Moron) 0.25 MG tablet Take 1 tablet by mouth as needed. 06/13/13  Yes Historical Provider, MD  amLODipine (NORVASC) 10 MG tablet Take 10 mg by mouth daily.     Yes Historical Provider, MD  aspirin EC 81 MG tablet Take 81 mg by mouth daily.     Yes Historical Provider, MD  benzonatate (TESSALON) 100 MG capsule Take 100-200 mg by mouth 3 (three) times daily as needed for cough.   Yes Historical Provider, MD  buPROPion (WELLBUTRIN SR) 150 MG 12 hr tablet Take 150 mg by mouth daily.   Yes Historical Provider, MD  LOSARTAN POTASSIUM PO Take by mouth.     Yes Historical Provider, MD  metFORMIN (GLUCOPHAGE) 500 MG tablet Take 500 mg by mouth 2 (two) times daily with a meal.  Yes Historical Provider, MD  metoprolol succinate (TOPROL-XL) 25 MG 24 hr tablet Take 25 mg by mouth daily.     Yes Historical Provider, MD  pioglitazone (ACTOS) 30 MG tablet Take 1 tablet by mouth daily. 07/09/13  Yes Historical Provider, MD  albuterol (PROVENTIL HFA;VENTOLIN HFA) 108 (90 BASE) MCG/ACT inhaler Inhale 1-2 puffs into the lungs every 6 (six) hours as needed for wheezing. Patient not taking: Reported on 11/03/2014 07/31/12   Adlih Moreno-Coll, MD  benzonatate (TESSALON) 200 MG capsule Take 1 capsule (200 mg total) by mouth 3 (three) times daily as needed for cough. Patient not taking: Reported on 11/03/2014  07/28/13   Harden Mo, MD  BuPROPion HCl (WELLBUTRIN PO) Take by mouth.      Historical Provider, MD  cloNIDine (CATAPRES) 0.1 MG tablet Take 0.1 mg by mouth 2 (two) times daily.      Historical Provider, MD  guaiFENesin-dextromethorphan (ROBITUSSIN DM) 100-10 MG/5ML syrup Take 5 mLs by mouth 3 (three) times daily as needed for cough. 11/03/14   Davonna Belling, MD   BP 188/55 mmHg  Pulse 65  Temp(Src) 97.8 F (36.6 C) (Oral)  Resp 18  Ht 5\' 9"  (1.753 m)  Wt 192 lb (87.091 kg)  BMI 28.34 kg/m2  SpO2 100% Physical Exam  Constitutional: She appears well-developed and well-nourished.  HENT:  Head: Normocephalic and atraumatic.  Cardiovascular: Normal rate, regular rhythm and normal heart sounds.   No murmur heard. Pulmonary/Chest: Effort normal. No respiratory distress. She has no wheezes. She has no rales.  Frequent coughing  Abdominal: Soft.  Musculoskeletal: Normal range of motion. She exhibits no edema.  Neurological: She is alert.  Skin: Skin is warm and dry.  Psychiatric: She has a normal mood and affect. Her speech is normal.  Nursing note and vitals reviewed.   ED Course  Procedures (including critical care time) Labs Review Labs Reviewed - No data to display  Imaging Review No results found.   EKG Interpretation None      MDM   Final diagnoses:  URI (upper respiratory infection)    Patient with cough. Apparent URI. X-ray does not show pneumonia. Will discharge home.    Davonna Belling, MD 11/06/14 2225

## 2014-11-03 NOTE — ED Notes (Signed)
Pt off unit with xray 

## 2015-03-04 DIAGNOSIS — K219 Gastro-esophageal reflux disease without esophagitis: Secondary | ICD-10-CM | POA: Insufficient documentation

## 2015-07-01 ENCOUNTER — Encounter (HOSPITAL_COMMUNITY): Payer: Self-pay | Admitting: Emergency Medicine

## 2015-07-01 ENCOUNTER — Emergency Department (HOSPITAL_COMMUNITY): Payer: Medicare Other

## 2015-07-01 ENCOUNTER — Emergency Department (HOSPITAL_COMMUNITY)
Admission: EM | Admit: 2015-07-01 | Discharge: 2015-07-01 | Disposition: A | Discharge status: Home / Self Care | Payer: Medicare Other | Attending: Emergency Medicine | Admitting: Emergency Medicine

## 2015-07-01 ENCOUNTER — Emergency Department (INDEPENDENT_AMBULATORY_CARE_PROVIDER_SITE_OTHER)
Admission: EM | Admit: 2015-07-01 | Discharge: 2015-07-01 | Disposition: A | Discharge status: Nursing Home (Includes ICF) | Payer: Medicare Other | Source: Home / Self Care

## 2015-07-01 DIAGNOSIS — F17210 Nicotine dependence, cigarettes, uncomplicated: Secondary | ICD-10-CM | POA: Diagnosis not present

## 2015-07-01 DIAGNOSIS — R7989 Other specified abnormal findings of blood chemistry: Secondary | ICD-10-CM | POA: Diagnosis not present

## 2015-07-01 DIAGNOSIS — Z79899 Other long term (current) drug therapy: Secondary | ICD-10-CM | POA: Diagnosis not present

## 2015-07-01 DIAGNOSIS — E119 Type 2 diabetes mellitus without complications: Secondary | ICD-10-CM | POA: Insufficient documentation

## 2015-07-01 DIAGNOSIS — R001 Bradycardia, unspecified: Secondary | ICD-10-CM

## 2015-07-01 DIAGNOSIS — J069 Acute upper respiratory infection, unspecified: Secondary | ICD-10-CM | POA: Diagnosis not present

## 2015-07-01 DIAGNOSIS — I209 Angina pectoris, unspecified: Secondary | ICD-10-CM | POA: Diagnosis not present

## 2015-07-01 DIAGNOSIS — Z880 Allergy status to penicillin: Secondary | ICD-10-CM | POA: Diagnosis not present

## 2015-07-01 DIAGNOSIS — Z7982 Long term (current) use of aspirin: Secondary | ICD-10-CM | POA: Diagnosis not present

## 2015-07-01 DIAGNOSIS — R0602 Shortness of breath: Secondary | ICD-10-CM | POA: Diagnosis not present

## 2015-07-01 DIAGNOSIS — I10 Essential (primary) hypertension: Secondary | ICD-10-CM | POA: Insufficient documentation

## 2015-07-01 DIAGNOSIS — R062 Wheezing: Secondary | ICD-10-CM | POA: Diagnosis not present

## 2015-07-01 DIAGNOSIS — Z8673 Personal history of transient ischemic attack (TIA), and cerebral infarction without residual deficits: Secondary | ICD-10-CM | POA: Insufficient documentation

## 2015-07-01 DIAGNOSIS — R079 Chest pain, unspecified: Secondary | ICD-10-CM | POA: Diagnosis not present

## 2015-07-01 LAB — I-STAT TROPONIN, ED: Troponin i, poc: 0.01 ng/mL (ref 0.00–0.08)

## 2015-07-01 LAB — BASIC METABOLIC PANEL
Anion gap: 7 (ref 5–15)
BUN: 24 mg/dL — ABNORMAL HIGH (ref 6–20)
CALCIUM: 9.3 mg/dL (ref 8.9–10.3)
CO2: 22 mmol/L (ref 22–32)
CREATININE: 1.23 mg/dL — AB (ref 0.44–1.00)
Chloride: 111 mmol/L (ref 101–111)
GFR calc Af Amer: 47 mL/min — ABNORMAL LOW (ref >60)
GFR, EST NON AFRICAN AMERICAN: 41 mL/min — AB (ref >60)
GLUCOSE: 109 mg/dL — AB (ref 65–99)
POTASSIUM: 4.9 mmol/L (ref 3.5–5.1)
SODIUM: 140 mmol/L (ref 135–145)

## 2015-07-01 LAB — CBC
HEMATOCRIT: 34.9 % — AB (ref 36.0–46.0)
Hemoglobin: 10.9 g/dL — ABNORMAL LOW (ref 12.0–15.0)
MCH: 25.9 pg — ABNORMAL LOW (ref 26.0–34.0)
MCHC: 31.2 g/dL (ref 30.0–36.0)
MCV: 82.9 fL (ref 78.0–100.0)
PLATELETS: 199 10*3/uL (ref 150–400)
RBC: 4.21 MIL/uL (ref 3.87–5.11)
RDW: 15.6 % — AB (ref 11.5–15.5)
WBC: 5.6 10*3/uL (ref 4.0–10.5)

## 2015-07-01 LAB — BRAIN NATRIURETIC PEPTIDE: B Natriuretic Peptide: 233.8 pg/mL — ABNORMAL HIGH (ref 0.0–100.0)

## 2015-07-01 MED ORDER — PREDNISONE 20 MG PO TABS
60.0000 mg | ORAL_TABLET | Freq: Once | ORAL | Status: AC
  Administered 2015-07-01: 60 mg via ORAL
  Filled 2015-07-01: qty 3

## 2015-07-01 MED ORDER — PREDNISONE 10 MG PO TABS: 40.0000 mg | ORAL_TABLET | Freq: Every day | ORAL | Status: DC

## 2015-07-01 MED ORDER — SODIUM CHLORIDE 0.9 % IV BOLUS (SEPSIS)
1000.0000 mL | Freq: Once | INTRAVENOUS | Status: AC
  Administered 2015-07-01: 1000 mL via INTRAVENOUS

## 2015-07-01 NOTE — ED Notes (Signed)
Pt has been having a productive cough for 1 week now, today pt started having a discomfort in left and right side of chest. Pt also reports feeling nauseous at the onset of the pain with no vomiting.

## 2015-07-01 NOTE — ED Notes (Signed)
Cough and runny nose for a week.  Last night noticed sob.  Today has diarrhea, nausea , but no vomiting.  Patient has had a funny feeling in chest that she took one of her nitro and discomfort eased some, but not completely.  "odd feeling in chest " continues.  Patient feels bad in general

## 2015-07-01 NOTE — ED Provider Notes (Signed)
CSN: [id number]     Arrival date & time 07/01/15  1405 History   None    No chief complaint on file.  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 Hypertension   . Diabetes mellitus   . Stroke    Past Surgical History  Procedure Laterality Date  . Total knee arthroplasty    . Ankle surgery    . Abdominal hysterectomy     Family History  Problem Relation Age of Onset  . Diabetes Mother   . Diabetes Sister   . Hyperlipidemia Sister   . Diabetes Brother   . Diabetes Son    Social History  Substance Use Topics  . Smoking status: Current Every Day Smoker -- 0.50 packs/day for 12 years    Types: Cigarettes  . Smokeless tobacco: Never Used     Comment: Taking Wellbutrin  . Alcohol Use: No   OB History    No data available     Review of Systems ROS +'vecold symptoms, chest tightness  Denies: HEADACHE, NAUSEA, ABDOMINAL PAIN,  CONGESTION, DYSURIA, SHORTNESS OF BREATH  Allergies  Codeine and Penicillins  Home Medications   Prior to Admission medications   Medication Sig Start Date End Date Taking? Authorizing Provider  albuterol (PROVENTIL HFA;VENTOLIN HFA) 108 (90 BASE) MCG/ACT inhaler Inhale 1-2 puffs into the lungs every 6 (six) hours as needed for wheezing. Patient not taking: Reported on 11/03/2014 07/31/12   Adlih Moreno-Coll, MD  albuterol (PROVENTIL HFA;VENTOLIN HFA) 108 (90 BASE) MCG/ACT inhaler Inhale 2 puffs into the lungs 4 (four) times daily. 07/28/13   Harden Mo, MD  ALPRAZolam Duanne Moron) 0.25 MG tablet Take 1 tablet  by mouth as needed. 06/13/13   Historical Provider, MD  amLODipine (NORVASC) 10 MG tablet Take 10 mg by mouth daily.      Historical Provider, MD  aspirin EC 81 MG tablet Take 81 mg by mouth daily.      Historical Provider, MD  benzonatate (TESSALON) 100 MG capsule Take 100-200 mg by mouth 3 (three) times daily as needed for cough.    Historical Provider, MD  benzonatate (TESSALON) 200 MG capsule Take 1 capsule (200 mg total) by mouth 3 (three) times daily as needed for cough. Patient not taking: Reported on 11/03/2014 07/28/13   Harden Mo, MD  buPROPion [facility] SR) 150 MG 12 hr tablet Take 150 mg by mouth daily.    Historical Provider, MD  BuPROPion HCl (WELLBUTRIN PO) Take by mouth.      Historical Provider, MD  cloNIDine (CATAPRES) 0.1 MG tablet Take 0.1 mg by mouth 2 (two) times daily.      Historical Provider, MD  guaiFENesin-dextromethorphan (ROBITUSSIN DM) 100-10 MG/5ML syrup Take 5 mLs by mouth 3 (three) times daily as needed for cough. 11/03/14   Davonna Belling, MD  LOSARTAN POTASSIUM PO Take by mouth.      Historical Provider, MD  metFORMIN (GLUCOPHAGE) 500 MG tablet Take 500 mg by mouth 2 (two) times daily with a meal.      Historical Provider, MD  metoprolol succinate (TOPROL-XL) 25 MG 24 hr tablet Take 25 mg by mouth daily.      Historical  Provider, MD  pioglitazone (ACTOS) 30 MG tablet Take 1 tablet by mouth daily. 07/09/13   Historical Provider, MD   Meds Ordered and Administered this Visit  Medications - No data to display  There were no vitals taken for this visit. No data found.   Physical Exam  Constitutional: She is oriented to person, place, and time. She appears well-developed and well-nourished.  HENT:  Head: Normocephalic and atraumatic.  Right Ear: External ear normal.  Left Ear: External ear normal.  Mouth/Throat: Oropharynx is clear and moist.  Neck: Normal range of motion. Neck supple.  Cardiovascular: Normal rate.   Pulmonary/Chest: Effort normal and  breath sounds normal.  Abdominal: Soft.  Musculoskeletal: Normal range of motion.  Neurological: She is alert and oriented to person, place, and time.  Skin: Skin is warm and dry.  Psychiatric: She has a normal mood and affect. Her behavior is normal. Judgment and thought content normal.    ED Course  Procedures (including critical care time)  Labs Review Labs Reviewed - No data to display  Imaging Review No results found.   Visual Acuity Review  Right Eye Distance:   Left Eye Distance:   Bilateral Distance:    Right Eye Near:   Left Eye Near:    Bilateral Near:         MDM   1. URI (upper respiratory infection)   2. Chest pain, unspecified chest pain type   3. Angina pectoris (HCC)      Pt has history of angina in the past. Take NTG, symptoms are much better but has residual chest discomfort.  Chest issue was mentioned as a side bar. She was here for evaluation of cough and shortness of breath.   Transport to ER will be organized.    Konrad Felix, PA 07/01/15 1551  Review of ECG: Sinus Bradycardia with a rate of 57. No acute changes.   Konrad Felix, [place] 07/01/15 [phone]

## 2015-07-01 NOTE — ED Notes (Signed)
All documentation done by this RN Eilene Ghazi. documented under tech name in error.

## 2015-07-01 NOTE — ED Notes (Signed)
MD at bedside. 

## 2015-07-01 NOTE — ED Provider Notes (Signed)
CSN: [id number]     Arrival date & time 07/01/15  1553 History   First MD Initiated Contact with Patient 07/01/15 2114     Chief Complaint  Patient presents with  . Chest Pain   Patient is a 80 y.o. female presenting with shortness of breath. The history is provided by the patient.  Shortness of Breath Severity:  Mild Onset quality:  Gradual Timing:  Intermittent Chronicity:  New Relieved by:  Nothing Associated symptoms: chest pain and cough   Associated symptoms: no abdominal pain, no fever, no headaches, no neck pain, no rash, no sore throat and no vomiting     Past Medical History  Diagnosis Date  . Hypertension   . Diabetes mellitus   . Stroke [facility])    Past Surgical History  Procedure Laterality Date  . Total knee arthroplasty    . Ankle surgery    . Abdominal hysterectomy     Family History  Problem Relation Age of Onset  . Diabetes Mother   . Diabetes Sister   . Hyperlipidemia Sister   . Diabetes Brother   . Diabetes Son    Social History  Substance Use Topics  . Smoking status: Current Every Day Smoker -- 0.50 packs/day for 12 years    Types: Cigarettes  . Smokeless tobacco: Never Used     Comment: Taking Wellbutrin  . Alcohol Use: No   OB History    No data available     Review of Systems  Constitutional: Negative for fever.  HENT: Negative for rhinorrhea and sore throat.   Eyes: Negative for visual disturbance.  Respiratory: Positive for cough and shortness of breath. Negative for chest tightness.   Cardiovascular: Positive for chest pain. Negative for palpitations.  Gastrointestinal: Negative for nausea, vomiting, abdominal pain and constipation.  Genitourinary: Negative for dysuria and hematuria.  Musculoskeletal: Negative for back pain and neck pain.  Skin: Negative for rash.  Neurological: Negative for dizziness and headaches.  Psychiatric/Behavioral: Negative for confusion.  All other systems reviewed and are negative.  Allergies   Codeine and Penicillins  Home Medications   Prior to Admission medications   Medication Sig Start Date End Date Taking? Authorizing Provider  albuterol (PROVENTIL HFA;VENTOLIN HFA) 108 (90 BASE) MCG/ACT inhaler Inhale 2 puffs into the lungs 4 (four) times daily. 07/28/13  Yes Harden Mo, MD  amLODipine (NORVASC) 10 MG tablet Take 10 mg by mouth daily.     Yes Historical Provider, MD  aspirin EC 81 MG tablet Take 81 mg by mouth daily.     Yes Historical Provider, MD  benzonatate (TESSALON) 100 MG capsule Take 100-200 mg by mouth 3 (three) times daily as needed for cough.   Yes Historical Provider, MD  Dextromethorphan Polistirex (DELSYM PO) Take 15 mLs by mouth daily as needed. For cough per patient   Yes Historical Provider, MD  LOSARTAN POTASSIUM PO Take 1 tablet by mouth daily.    Yes Historical Provider, MD  metFORMIN (GLUCOPHAGE) 500 MG tablet Take 500 mg by mouth 2 (two) times daily with a meal.     Yes Historical Provider, MD  metoprolol succinate (TOPROL-XL) 25 MG 24 hr tablet Take 25 mg by mouth daily.     Yes Historical Provider, MD  pioglitazone (ACTOS) 30 MG tablet Take 1 tablet by mouth daily. 07/09/13  Yes Historical Provider, MD  albuterol (PROVENTIL HFA;VENTOLIN HFA) 108 (90 BASE) MCG/ACT inhaler Inhale 1-2 puffs into the lungs every 6 (six) hours as needed for wheezing.  Patient not taking: Reported on 11/03/2014 07/31/12   Adlih Moreno-Coll, MD  benzonatate (TESSALON) 200 MG capsule Take 1 capsule (200 mg total) by mouth 3 (three) times daily as needed for cough. Patient not taking: Reported on 11/03/2014 07/28/13   Harden Mo, MD  guaiFENesin-dextromethorphan [facility] DM) 100-10 MG/5ML syrup Take 5 mLs by mouth 3 (three) times daily as needed for cough. Patient not taking: Reported on 07/01/2015 11/03/14   Davonna Belling, MD  predniSONE (DELTASONE) 10 MG tablet Take 4 tablets (40 mg total) by mouth daily. 07/01/15   Gustavus Bryant, MD   BP 173/53 mmHg  Pulse 49   Temp(Src) 97.8 F (36.6 C) (Oral)  Resp 16  Ht '5\' 9"'$  (1.753 m)  Wt 87.091 kg  BMI 28.34 kg/m2  SpO2 96% Physical Exam  Constitutional: She is oriented to person, place, and time. She appears well-developed and well-nourished. No distress.  HENT:  Head: Normocephalic and atraumatic.  Mouth/Throat: Oropharynx is clear and moist.  Eyes: EOM are normal. Pupils are equal, round, and reactive to light.  Neck: Neck supple. No JVD present.  Cardiovascular: Normal rate, regular rhythm, normal heart sounds and intact distal pulses.  Exam reveals no gallop.   No murmur heard. Pulmonary/Chest: Effort normal. She has wheezes (left upper and lower lobes). She has no rales.  Abdominal: Soft. She exhibits no distension. There is no tenderness.  Musculoskeletal: Normal range of motion. She exhibits no tenderness.  Neurological: She is alert and oriented to person, place, and time. No cranial nerve deficit. She exhibits normal muscle tone.  Skin: Skin is warm and dry. No rash noted.  Psychiatric: Her behavior is normal.    ED Course  Procedures  None   Labs Review Labs Reviewed  BASIC METABOLIC PANEL - Abnormal; Notable for the following:    Glucose, Bld 109 (*)    BUN 24 (*)    Creatinine, Ser 1.23 (*)    GFR calc non Af Amer 41 (*)    GFR calc Af Amer 47 (*)    All other components within normal limits  CBC - Abnormal; Notable for the following:    Hemoglobin 10.9 (*)    HCT 34.9 (*)    MCH 25.9 (*)    RDW 15.6 (*)    All other components within normal limits  BRAIN NATRIURETIC PEPTIDE - Abnormal; Notable for the following:    B Natriuretic Peptide 233.8 (*)    All other components within normal limits  I-STAT TROPOININ, ED    Imaging Review Dg Chest 2 View  07/01/2015  CLINICAL DATA:  Mid to upper chest pain since 01/1939 5 a.m. today, history of myocardial infarction EXAM: CHEST  2 VIEW COMPARISON:  11/03/2014 FINDINGS: Mild cardiac enlargement stable. Vascular pattern normal.  Mild diffuse interstitial prominence very similar to prior study. No focal consolidation or effusion. IMPRESSION: Similar to prior study, diffuse interstitial opacification may suggest chronic interstitial lung disease versus developing interstitial pulmonary edema. Electronically Signed   By: Skipper Cliche M.D.   On: 07/01/2015 17:01   I have personally reviewed and evaluated these images and lab results as part of my medical decision-making.   EKG Interpretation   Date/Time:  Tuesday July 01 2015 16:11:08 EST Ventricular Rate:  49 PR Interval:  178 QRS Duration: 70 QT Interval:  480 QTC Calculation: 433 R Axis:   56 Text Interpretation:  Sinus bradycardia No significant change since last  tracing Confirmed by Ashok Cordia  MD, Lennette Bihari ([id number]) on 07/01/2015  11:11:58 PM      MDM   Final diagnoses:  Wheezing  Elevated brain natriuretic peptide (BNP) level  Sinus bradycardia    80 yo F with a PMH of tobacco abuse, DM, and HTN who presents with progressive cough, SOB and chest discomfort. Currently pain free. Left sided expiratory wheezes on exam. Wet cough on exam. CXR with mild cardiomegaly without focal infiltrates, frank pulmonary edema or pleural effusions. Likely has underlying COPD. Prednisone PO given here. Troponin negative. BNP elevated. Sinus brady on EKG without acute ischemic changes. Pt has remained chest pain free during her stay. Mild AKI. NS bolus 1000 mL given. I provided verbal discharge instructions including follow up and return precautions.  Will d/c home with steroid burst. Patient agreeable with plan.  Discussed with Dr. Ashok Cordia.  Gustavus Bryant, MD 07/02/15 [id number]  Lajean Saver, MD 07/02/15 [phone]

## 2015-07-22 ENCOUNTER — Other Ambulatory Visit: Payer: Self-pay | Admitting: Cardiovascular Disease

## 2015-07-22 DIAGNOSIS — R0789 Other chest pain: Secondary | ICD-10-CM

## 2015-07-23 ENCOUNTER — Other Ambulatory Visit (HOSPITAL_COMMUNITY): Payer: Medicare Other

## 2015-07-23 ENCOUNTER — Encounter (HOSPITAL_COMMUNITY)
Admission: RE | Admit: 2015-07-23 | Discharge: 2015-07-23 | Disposition: A | Discharge status: Home / Self Care | Payer: Medicare Other | Source: Ambulatory Visit | Attending: Cardiovascular Disease | Admitting: Cardiovascular Disease

## 2015-07-23 DIAGNOSIS — Z8673 Personal history of transient ischemic attack (TIA), and cerebral infarction without residual deficits: Secondary | ICD-10-CM | POA: Diagnosis not present

## 2015-07-23 DIAGNOSIS — R0789 Other chest pain: Secondary | ICD-10-CM

## 2015-07-23 DIAGNOSIS — I10 Essential (primary) hypertension: Secondary | ICD-10-CM | POA: Diagnosis not present

## 2015-07-23 DIAGNOSIS — E119 Type 2 diabetes mellitus without complications: Secondary | ICD-10-CM | POA: Diagnosis not present

## 2015-07-23 MED ORDER — REGADENOSON 0.4 MG/5ML IV SOLN
0.4000 mg | Freq: Once | INTRAVENOUS | Status: AC
  Administered 2015-07-23: 0.4 mg via INTRAVENOUS

## 2015-07-23 MED ORDER — REGADENOSON 0.4 MG/5ML IV SOLN
INTRAVENOUS | Status: AC
  Administered 2015-07-23: 0.4 mg via INTRAVENOUS
  Filled 2015-07-23: qty 5

## 2015-07-23 MED ORDER — TECHNETIUM TC 99M SESTAMIBI GENERIC - CARDIOLITE
10.0000 | Freq: Once | INTRAVENOUS | Status: AC | PRN
  Administered 2015-07-23: 10 via INTRAVENOUS

## 2015-07-23 MED ORDER — TECHNETIUM TC 99M SESTAMIBI GENERIC - CARDIOLITE
30.0000 | Freq: Once | INTRAVENOUS | Status: AC | PRN
  Administered 2015-07-23: 30 via INTRAVENOUS

## 2015-10-15 ENCOUNTER — Emergency Department (HOSPITAL_COMMUNITY)
Admission: EM | Admit: 2015-10-15 | Discharge: 2015-10-15 | Disposition: A | Discharge status: Home / Self Care | Payer: Medicare Other | Attending: Emergency Medicine | Admitting: Emergency Medicine

## 2015-10-15 ENCOUNTER — Encounter (HOSPITAL_COMMUNITY): Payer: Self-pay | Admitting: Family Medicine

## 2015-10-15 ENCOUNTER — Emergency Department (HOSPITAL_COMMUNITY): Payer: Medicare Other

## 2015-10-15 DIAGNOSIS — Z8673 Personal history of transient ischemic attack (TIA), and cerebral infarction without residual deficits: Secondary | ICD-10-CM | POA: Insufficient documentation

## 2015-10-15 DIAGNOSIS — S29002A Unspecified injury of muscle and tendon of back wall of thorax, initial encounter: Secondary | ICD-10-CM | POA: Insufficient documentation

## 2015-10-15 DIAGNOSIS — S40011A Contusion of right shoulder, initial encounter: Secondary | ICD-10-CM | POA: Diagnosis not present

## 2015-10-15 DIAGNOSIS — Z79899 Other long term (current) drug therapy: Secondary | ICD-10-CM | POA: Insufficient documentation

## 2015-10-15 DIAGNOSIS — Y9301 Activity, walking, marching and hiking: Secondary | ICD-10-CM | POA: Insufficient documentation

## 2015-10-15 DIAGNOSIS — Z7984 Long term (current) use of oral hypoglycemic drugs: Secondary | ICD-10-CM | POA: Insufficient documentation

## 2015-10-15 DIAGNOSIS — S8991XA Unspecified injury of right lower leg, initial encounter: Secondary | ICD-10-CM | POA: Diagnosis not present

## 2015-10-15 DIAGNOSIS — E119 Type 2 diabetes mellitus without complications: Secondary | ICD-10-CM | POA: Diagnosis not present

## 2015-10-15 DIAGNOSIS — Y998 Other external cause status: Secondary | ICD-10-CM | POA: Insufficient documentation

## 2015-10-15 DIAGNOSIS — I10 Essential (primary) hypertension: Secondary | ICD-10-CM | POA: Diagnosis not present

## 2015-10-15 DIAGNOSIS — S199XXA Unspecified injury of neck, initial encounter: Secondary | ICD-10-CM | POA: Diagnosis not present

## 2015-10-15 DIAGNOSIS — W19XXXA Unspecified fall, initial encounter: Secondary | ICD-10-CM

## 2015-10-15 DIAGNOSIS — Z7982 Long term (current) use of aspirin: Secondary | ICD-10-CM | POA: Diagnosis not present

## 2015-10-15 DIAGNOSIS — Y9289 Other specified places as the place of occurrence of the external cause: Secondary | ICD-10-CM | POA: Diagnosis not present

## 2015-10-15 DIAGNOSIS — T148 Other injury of unspecified body region: Secondary | ICD-10-CM | POA: Insufficient documentation

## 2015-10-15 DIAGNOSIS — W1839XA Other fall on same level, initial encounter: Secondary | ICD-10-CM | POA: Diagnosis not present

## 2015-10-15 DIAGNOSIS — R067 Sneezing: Secondary | ICD-10-CM | POA: Insufficient documentation

## 2015-10-15 DIAGNOSIS — S7001XA Contusion of right hip, initial encounter: Secondary | ICD-10-CM | POA: Diagnosis not present

## 2015-10-15 DIAGNOSIS — R0981 Nasal congestion: Secondary | ICD-10-CM | POA: Insufficient documentation

## 2015-10-15 DIAGNOSIS — F17210 Nicotine dependence, cigarettes, uncomplicated: Secondary | ICD-10-CM | POA: Diagnosis not present

## 2015-10-15 DIAGNOSIS — S0990XA Unspecified injury of head, initial encounter: Secondary | ICD-10-CM | POA: Diagnosis not present

## 2015-10-15 DIAGNOSIS — Z880 Allergy status to penicillin: Secondary | ICD-10-CM | POA: Insufficient documentation

## 2015-10-15 DIAGNOSIS — S4991XA Unspecified injury of right shoulder and upper arm, initial encounter: Secondary | ICD-10-CM | POA: Diagnosis present

## 2015-10-15 DIAGNOSIS — T148XXA Other injury of unspecified body region, initial encounter: Secondary | ICD-10-CM

## 2015-10-15 MED ORDER — CYCLOBENZAPRINE HCL 5 MG PO TABS: 5.0000 mg | ORAL_TABLET | Freq: Three times a day (TID) | ORAL | Status: DC | PRN

## 2015-10-15 NOTE — ED Provider Notes (Signed)
CSN: [id number]     Arrival date & time 10/15/15  1153 History   First MD Initiated Contact with Patient 10/15/15 1552     Chief Complaint  Patient presents with  . Fall     (Consider location/radiation/quality/duration/timing/severity/associated sxs/prior Treatment) HPI Comments: Fell yesterday Mechanical Fell backwards Hit head No LOC No blood thinners Back of head, right side of neck, right shoulder, right hip, right leg 7/10 pain, most sore trapezius Sunday fell backwards in bathtub  Patient is a 81 y.o. female presenting with fall.  Fall Associated symptoms include headaches. Pertinent negatives include no chest pain, no abdominal pain and no shortness of breath.    Past Medical History  Diagnosis Date  . Hypertension   . Diabetes mellitus   . Stroke [facility])    Past Surgical History  Procedure Laterality Date  . Total knee arthroplasty    . Ankle surgery    . Abdominal hysterectomy     Family History  Problem Relation Age of Onset  . Diabetes Mother   . Diabetes Sister   . Hyperlipidemia Sister   . Diabetes Brother   . Diabetes Son    Social History  Substance Use Topics  . Smoking status: Current Every Day Smoker -- 0.50 packs/day for 12 years    Types: Cigarettes  . Smokeless tobacco: Never Used     Comment: Taking Wellbutrin  . Alcohol Use: No   OB History    No data available     Review of Systems  Constitutional: Negative for fever.  HENT: Positive for congestion and sneezing. Negative for sore throat.   Eyes: Negative for visual disturbance.  Respiratory: Negative for cough and shortness of breath.   Cardiovascular: Negative for chest pain.  Gastrointestinal: Negative for nausea, vomiting and abdominal pain.  Genitourinary: Negative for difficulty urinating.  Musculoskeletal: Positive for arthralgias and neck pain. Negative for back pain.  Skin: Negative for rash.  Neurological: Positive for headaches. Negative for syncope, weakness and  numbness.      Allergies  Codeine and Penicillins  Home Medications   Prior to Admission medications   Medication Sig Start Date End Date Taking? Authorizing Provider  albuterol (PROVENTIL HFA;VENTOLIN HFA) 108 (90 BASE) MCG/ACT inhaler Inhale 2 puffs into the lungs 4 (four) times daily. 07/28/13  Yes Harden Mo, MD  amLODipine (NORVASC) 10 MG tablet Take 10 mg by mouth daily.     Yes Historical Provider, MD  aspirin EC 81 MG tablet Take 81 mg by mouth daily.     Yes Historical Provider, MD  atorvastatin (LIPITOR) 10 MG tablet Take 10 mg by mouth daily. 10/04/15  Yes Historical Provider, MD  benzonatate (TESSALON) 100 MG capsule Take 100-200 mg by mouth 3 (three) times daily as needed for cough.   Yes Historical Provider, MD  Dextromethorphan Polistirex (DELSYM PO) Take 15 mLs by mouth daily as needed. For cough per patient   Yes Historical Provider, MD  metFORMIN (GLUCOPHAGE) 500 MG tablet Take 500 mg by mouth 2 (two) times daily with a meal.     Yes Historical Provider, MD  metoprolol succinate (TOPROL-XL) 25 MG 24 hr tablet Take 25 mg by mouth daily.     Yes Historical Provider, MD  nitroGLYCERIN (NITROSTAT) 0.4 MG SL tablet TK 1 T UNDER THE TONGUE Q 5 MINUTES X 3 DOSES PRF CHEST PAIN 07/29/15  Yes Historical Provider, MD  cyclobenzaprine (FLEXERIL) 5 MG tablet Take 1 tablet (5 mg total) by mouth 3 (three) times  daily as needed for muscle spasms. 10/15/15   Gareth Morgan, MD   BP 169/56 mmHg  Pulse 51  Temp(Src) 97.4 F (36.3 C) (Oral)  Resp 16  Ht '5\' 9"'$  (1.753 m)  Wt 181 lb (82.101 kg)  BMI 26.72 kg/m2  SpO2 100% Physical Exam  Constitutional: She is oriented to person, place, and time. She appears well-developed and well-nourished. No distress.  HENT:  Head: Normocephalic and atraumatic.  Eyes: Conjunctivae and EOM are normal.  Neck: Normal range of motion.  Cardiovascular: Normal rate, regular rhythm, normal heart sounds and intact distal pulses.  Exam reveals no gallop  and no friction rub.   No murmur heard. Pulmonary/Chest: Effort normal and breath sounds normal. No respiratory distress. She has no wheezes. She has no rales.  Abdominal: Soft. She exhibits no distension. There is no tenderness. There is no guarding.  Musculoskeletal: She exhibits no edema.       Cervical back: She exhibits bony tenderness (lower cervical).       Thoracic back: She exhibits tenderness. She exhibits no bony tenderness.       Lumbar back: She exhibits no tenderness and no bony tenderness.  Neurological: She is alert and oriented to person, place, and time. She has normal strength. No cranial nerve deficit or sensory deficit. GCS eye subscore is 4. GCS verbal subscore is 5. GCS motor subscore is 6.  Skin: Skin is warm and dry. Ecchymosis (right hip, right shoulder) noted. No rash noted. She is not diaphoretic. No erythema.  Nursing note and vitals reviewed.   ED Course  Procedures (including critical care time) Labs Review Labs Reviewed - No data to display  Imaging Review Dg Shoulder Right  10/15/2015  CLINICAL DATA:  [address] yesterday on right side of body with lateral side and top of right shoulder pain. Initial encounter. EXAM: RIGHT SHOULDER - 2+ VIEW COMPARISON:  None. FINDINGS: No fracture. No evidence for shoulder separation or dislocation. There is degenerative change in the glenohumeral joint. Bones are diffusely demineralized. IMPRESSION: No acute bony abnormality. Electronically Signed   By: Misty Stanley M.D.   On: 10/15/2015 13:07   Ct Head Wo Contrast  10/15/2015  CLINICAL DATA:  81 year old female with a history of fall. Head injury. EXAM: CT HEAD WITHOUT CONTRAST CT CERVICAL SPINE WITHOUT CONTRAST TECHNIQUE: Multidetector CT imaging of the head and cervical spine was performed following the standard protocol without intravenous contrast. Multiplanar CT image reconstructions of the cervical spine were also generated. COMPARISON:  07/06/2005 FINDINGS: CT HEAD  FINDINGS Unremarkable appearance of the calvarium without acute fracture or aggressive lesion. Unremarkable appearance of the scalp soft tissues. Unremarkable appearance of the bilateral orbits. Mastoid air cells are clear. No significant paranasal sinus disease No acute intracranial hemorrhage, midline shift, or mass effect. Gray-white differentiation is maintained, without CT evidence of acute ischemia. Unremarkable configuration of the ventricles. Intracranial atherosclerotic calcifications. CT CERVICAL SPINE FINDINGS Craniocervical junction aligned. No acute fracture at the skullbase identified. Anatomic alignment of the cervical elements is relatively maintained. No subluxation. Vertebral body heights maintained. No fracture line identified. Facets maintain alignment. Multilevel degenerative changes of the cervical spine. Anterior osteophyte production within the mid and lower cervical spine. No bony canal narrowing. No significant bony canal narrowing. No evidence of epidural hemorrhage. Posterior disc osteophyte complex/disc herniation at C2-C3 on the left and C3-C4 on the left. These are more pronounced than the comparison CT. Unremarkable appearance of the cervical soft tissues. Unremarkable appearance of the lung apices.  IMPRESSION: Head CT: No CT evidence of acute intracranial abnormality. Cervical CT: No CT evidence of acute fracture or malalignment of the cervical spine. Degenerative disc disease, with posterior disc osteophyte complex/herniation on the left at C2-C3 and C3-C4, more pronounced than the comparison CT of 07/06/2005. Signed, Dulcy Fanny. Earleen Newport, DO [facility] Radiology Electronically Signed   By: Corrie Mckusick D.O.   On: 10/15/2015 12:46   Ct Cervical Spine Wo Contrast  10/15/2015  CLINICAL DATA:  81 year old female with a history of fall. Head injury. EXAM: CT HEAD WITHOUT CONTRAST CT CERVICAL SPINE WITHOUT CONTRAST TECHNIQUE: Multidetector  CT imaging of the head and cervical spine was performed following the standard protocol without intravenous contrast. Multiplanar CT image reconstructions of the cervical spine were also generated. COMPARISON:  07/06/2005 FINDINGS: CT HEAD FINDINGS Unremarkable appearance of the calvarium without acute fracture or aggressive lesion. Unremarkable appearance of the scalp soft tissues. Unremarkable appearance of the bilateral orbits. Mastoid air cells are clear. No significant paranasal sinus disease No acute intracranial hemorrhage, midline shift, or mass effect. Gray-white differentiation is maintained, without CT evidence of acute ischemia. Unremarkable configuration of the ventricles. Intracranial atherosclerotic calcifications. CT CERVICAL SPINE FINDINGS Craniocervical junction aligned. No acute fracture at the skullbase identified. Anatomic alignment of the cervical elements is relatively maintained. No subluxation. Vertebral body heights maintained. No fracture line identified. Facets maintain alignment. Multilevel degenerative changes of the cervical spine. Anterior osteophyte production within the mid and lower cervical spine. No bony canal narrowing. No significant bony canal narrowing. No evidence of epidural hemorrhage. Posterior disc osteophyte complex/disc herniation at C2-C3 on the left and C3-C4 on the left. These are more pronounced than the comparison CT. Unremarkable appearance of the cervical soft tissues. Unremarkable appearance of the lung apices. IMPRESSION: Head CT: No CT evidence of acute intracranial abnormality. Cervical CT: No CT evidence of acute fracture or malalignment of the cervical spine. Degenerative disc disease, with posterior disc osteophyte complex/herniation on the left at C2-C3 and C3-C4, more pronounced than the comparison CT of 07/06/2005. Signed, Dulcy Fanny. Earleen Newport, DO [facility] Radiology Electronically Signed   By: Corrie Mckusick D.O.   On: 10/15/2015 12:46   Dg Hip Unilat With Pelvis 2-3 Views Right  10/15/2015  CLINICAL DATA:  Fall yesterday and right-sided body with right hip pain. EXAM: DG HIP (WITH OR WITHOUT PELVIS) 2-3V RIGHT COMPARISON:  None. FINDINGS: Frontal pelvis shows the patient rotated to the left. SI joints and symphysis pubis are unremarkable. Joint space in the hips is preserved and symmetric. Mild acetabular degenerative changes are noted bilaterally. AP and frog-leg lateral views of the right hip show no evidence for femoral neck fracture. IMPRESSION: No acute bony findings. Electronically Signed   By: Misty Stanley M.D.   On: 10/15/2015 13:07   I have personally reviewed and evaluated these images and lab results as part of my medical decision-making.   EKG Interpretation None      MDM   Final diagnoses:  Fall, initial encounter  Muscle strain  Contusion   81yo female with history of DM, htn, CVA, presents with concern for mechanical fall and right shoulder and hip pain.  CT head, cervical spine without acute findings.  XR shoulder, hip without fx or dislocation.  Pt with history of mechanical fall, no LOC.  No other acute illness, mild allergies per pt.  Pt with contusions. Given shoulder sling for comfort and discussed need for ROM exercises.  Given rx for flexeril for muscle spasm. Patient discharged in stable condition with understanding of reasons to return.   Gareth Morgan, MD 10/16/15 [phone]

## 2015-10-15 NOTE — ED Notes (Signed)
Pt here for fall yesterday. sts second fall. sts she was walking to the kitchen and her floor is uneven and she fell backwards. sts head, back, neck, shoulder pain. sts whole right side hurts.

## 2015-12-15 ENCOUNTER — Ambulatory Visit
Admission: RE | Admit: 2015-12-15 | Discharge: 2015-12-15 | Disposition: A | Discharge status: Home / Self Care | Payer: Medicare Other | Source: Ambulatory Visit | Attending: Internal Medicine | Admitting: Internal Medicine

## 2015-12-15 ENCOUNTER — Other Ambulatory Visit: Payer: Self-pay | Admitting: Internal Medicine

## 2015-12-15 DIAGNOSIS — M542 Cervicalgia: Secondary | ICD-10-CM

## 2016-02-02 ENCOUNTER — Other Ambulatory Visit: Payer: Self-pay | Admitting: Internal Medicine

## 2016-02-02 ENCOUNTER — Ambulatory Visit
Admission: RE | Admit: 2016-02-02 | Discharge: 2016-02-02 | Disposition: A | Discharge status: Home / Self Care | Payer: Medicare Other | Source: Ambulatory Visit | Attending: Internal Medicine | Admitting: Internal Medicine

## 2016-02-02 DIAGNOSIS — F17200 Nicotine dependence, unspecified, uncomplicated: Secondary | ICD-10-CM

## 2016-02-02 DIAGNOSIS — R05 Cough: Secondary | ICD-10-CM

## 2016-02-02 DIAGNOSIS — R059 Cough, unspecified: Secondary | ICD-10-CM

## 2016-04-06 ENCOUNTER — Emergency Department (HOSPITAL_COMMUNITY)
Admission: EM | Admit: 2016-04-06 | Discharge: 2016-04-06 | Disposition: A | Discharge status: Home / Self Care | Payer: Medicare Other | Attending: Emergency Medicine | Admitting: Emergency Medicine

## 2016-04-06 ENCOUNTER — Encounter (HOSPITAL_COMMUNITY): Payer: Self-pay

## 2016-04-06 DIAGNOSIS — E119 Type 2 diabetes mellitus without complications: Secondary | ICD-10-CM | POA: Insufficient documentation

## 2016-04-06 DIAGNOSIS — N3000 Acute cystitis without hematuria: Secondary | ICD-10-CM | POA: Insufficient documentation

## 2016-04-06 DIAGNOSIS — Z8673 Personal history of transient ischemic attack (TIA), and cerebral infarction without residual deficits: Secondary | ICD-10-CM | POA: Diagnosis not present

## 2016-04-06 DIAGNOSIS — Z96659 Presence of unspecified artificial knee joint: Secondary | ICD-10-CM | POA: Diagnosis not present

## 2016-04-06 DIAGNOSIS — R112 Nausea with vomiting, unspecified: Secondary | ICD-10-CM | POA: Diagnosis not present

## 2016-04-06 DIAGNOSIS — Z7984 Long term (current) use of oral hypoglycemic drugs: Secondary | ICD-10-CM | POA: Diagnosis not present

## 2016-04-06 DIAGNOSIS — F17210 Nicotine dependence, cigarettes, uncomplicated: Secondary | ICD-10-CM | POA: Insufficient documentation

## 2016-04-06 DIAGNOSIS — Z7982 Long term (current) use of aspirin: Secondary | ICD-10-CM | POA: Insufficient documentation

## 2016-04-06 DIAGNOSIS — I2510 Atherosclerotic heart disease of native coronary artery without angina pectoris: Secondary | ICD-10-CM | POA: Insufficient documentation

## 2016-04-06 DIAGNOSIS — I10 Essential (primary) hypertension: Secondary | ICD-10-CM | POA: Insufficient documentation

## 2016-04-06 DIAGNOSIS — R1030 Lower abdominal pain, unspecified: Secondary | ICD-10-CM | POA: Diagnosis present

## 2016-04-06 LAB — URINALYSIS, ROUTINE W REFLEX MICROSCOPIC
Glucose, UA: NEGATIVE mg/dL
HGB URINE DIPSTICK: NEGATIVE
Ketones, ur: NEGATIVE mg/dL
Nitrite: POSITIVE — AB
PH: 5.5 (ref 5.0–8.0)
Protein, ur: 30 mg/dL — AB
SPECIFIC GRAVITY, URINE: 1.025 (ref 1.005–1.030)

## 2016-04-06 LAB — COMPREHENSIVE METABOLIC PANEL
ALBUMIN: 4.1 g/dL (ref 3.5–5.0)
ALT: 8 U/L — ABNORMAL LOW (ref 14–54)
ANION GAP: 9 (ref 5–15)
AST: 17 U/L (ref 15–41)
Alkaline Phosphatase: 47 U/L (ref 38–126)
BUN: 19 mg/dL (ref 6–20)
CALCIUM: 9.4 mg/dL (ref 8.9–10.3)
CHLORIDE: 111 mmol/L (ref 101–111)
CO2: 20 mmol/L — AB (ref 22–32)
Creatinine, Ser: 1.14 mg/dL — ABNORMAL HIGH (ref 0.44–1.00)
GFR calc non Af Amer: 45 mL/min — ABNORMAL LOW (ref >60)
GFR, EST AFRICAN AMERICAN: 52 mL/min — AB (ref >60)
Glucose, Bld: 149 mg/dL — ABNORMAL HIGH (ref 65–99)
POTASSIUM: 4 mmol/L (ref 3.5–5.1)
SODIUM: 140 mmol/L (ref 135–145)
Total Bilirubin: 0.2 mg/dL — ABNORMAL LOW (ref 0.3–1.2)
Total Protein: 7.9 g/dL (ref 6.5–8.1)

## 2016-04-06 LAB — URINE MICROSCOPIC-ADD ON

## 2016-04-06 LAB — CBC
HEMATOCRIT: 38 % (ref 36.0–46.0)
HEMOGLOBIN: 12 g/dL (ref 12.0–15.0)
MCH: 26.3 pg (ref 26.0–34.0)
MCHC: 31.6 g/dL (ref 30.0–36.0)
MCV: 83.3 fL (ref 78.0–100.0)
Platelets: 266 10*3/uL (ref 150–400)
RBC: 4.56 MIL/uL (ref 3.87–5.11)
RDW: 14.9 % (ref 11.5–15.5)
WBC: 6.6 10*3/uL (ref 4.0–10.5)

## 2016-04-06 LAB — LIPASE, BLOOD: LIPASE: 33 U/L (ref 11–51)

## 2016-04-06 MED ORDER — ONDANSETRON 4 MG PO TBDP
8.0000 mg | ORAL_TABLET | Freq: Once | ORAL | Status: AC
  Administered 2016-04-06: 8 mg via ORAL
  Filled 2016-04-06: qty 2

## 2016-04-06 MED ORDER — CEPHALEXIN 250 MG PO CAPS
500.0000 mg | ORAL_CAPSULE | Freq: Once | ORAL | Status: AC
Start: 2016-04-06 — End: 2016-04-06
  Administered 2016-04-06: 500 mg via ORAL
  Filled 2016-04-06: qty 2

## 2016-04-06 MED ORDER — ONDANSETRON 4 MG PO TBDP: 8.0000 mg | ORAL_TABLET | Freq: Once | ORAL | Status: DC

## 2016-04-06 MED ORDER — CEPHALEXIN 500 MG PO CAPS: 500.0000 mg | ORAL_CAPSULE | Freq: Three times a day (TID) | ORAL | 0 refills | Status: DC

## 2016-04-06 NOTE — ED Triage Notes (Signed)
Onset 4 days ago abd cramping, vomiting and nausea.  Last vomited yesterday.

## 2016-04-06 NOTE — ED Provider Notes (Signed)
[place] DEPT Provider Note   CSN: [id number] Arrival date & time: 04/06/16  1413     History   Chief Complaint Chief Complaint  Patient presents with  . Emesis    HPI [name] [name] is a 81 y.o. female.  Patient presents to the emergency department with lower abdominal cramping as well as nausea vomiting.  Her last episode of vomiting is yesterday.  She continues to be nauseated.  She denies diarrhea.  No fevers or chills.  No dysuria but does have some urinary frequency and suprapubic discomfort.  No back pain or flank pain.  No chest pain shortness breath.  Her symptoms are mild in severity.  Mild decreased oral intake today.  She does have a history of pancreatitis when she contacted her primary care physician today for evaluation her primary care physician recommended that she come to the ER for evaluation including laboratory studies to evaluate her pancreas.  She denies upper abdominal discomfort at this time   The history is provided by the patient.    Past Medical History:  Diagnosis Date  . Diabetes mellitus   . Hypertension   . Stroke [facility])     Patient Active Problem List   Diagnosis Date Noted  . Peripheral vascular disease, unspecified ([place]) 07/12/2013  . Atherosclerosis of native arteries of the extremities with intermittent claudication 07/12/2013  . Chest pain, cardiac 06/08/2011    Class: Acute  . Diabetes mellitus 06/08/2011    Class: History of  . HTN (hypertension), benign 06/08/2011    Class: Chronic  . Obesity (BMI 30-39.9) 06/08/2011    Class: History of  . Coronary artery disease 06/08/2011    Class: History of    Past Surgical History:  Procedure Laterality Date  . ABDOMINAL HYSTERECTOMY    . ANKLE SURGERY    . TOTAL KNEE ARTHROPLASTY      OB History    No data available       Home Medications    Prior to Admission medications   Medication Sig Start Date End Date Taking? Authorizing Provider  albuterol (PROVENTIL  HFA;VENTOLIN HFA) 108 (90 BASE) MCG/ACT inhaler Inhale 2 puffs into the lungs 4 (four) times daily. 07/28/13   Harden Mo, MD  amLODipine (NORVASC) 10 MG tablet Take 10 mg by mouth daily.      Historical Provider, MD  aspirin EC 81 MG tablet Take 81 mg by mouth daily.      Historical Provider, MD  atorvastatin (LIPITOR) 10 MG tablet Take 10 mg by mouth daily. 10/04/15   Historical Provider, MD  benzonatate (TESSALON) 100 MG capsule Take 100-200 mg by mouth 3 (three) times daily as needed for cough.    Historical Provider, MD  cephALEXin (KEFLEX) 500 MG capsule Take 1 capsule (500 mg total) by mouth 3 (three) times daily. 04/06/16   Jola Schmidt, MD  cyclobenzaprine (FLEXERIL) 5 MG tablet Take 1 tablet (5 mg total) by mouth 3 (three) times daily as needed for muscle spasms. 10/15/15   Gareth Morgan, MD  Dextromethorphan Polistirex (DELSYM PO) Take 15 mLs by mouth daily as needed. For cough per patient    Historical Provider, MD  metFORMIN (GLUCOPHAGE) 500 MG tablet Take 500 mg by mouth 2 (two) times daily with a meal.      Historical Provider, MD  metoprolol succinate (TOPROL-XL) 25 MG 24 hr tablet Take 25 mg by mouth daily.      Historical Provider, MD  nitroGLYCERIN (NITROSTAT) 0.4 MG SL tablet  TK 1 T UNDER THE TONGUE Q 5 MINUTES X 3 DOSES PRF CHEST PAIN 07/29/15   Historical Provider, MD    Family History Family History  Problem Relation Age of Onset  . Diabetes Mother   . Diabetes Sister   . Hyperlipidemia Sister   . Diabetes Brother   . Diabetes Son     Social History Social History  Substance Use Topics  . Smoking status: Current Every Day Smoker    Packs/day: 0.50    Years: 12.00    Types: Cigarettes  . Smokeless tobacco: Never Used     Comment: Taking Wellbutrin  . Alcohol use No     Allergies   Codeine and Penicillins   Review of Systems Review of Systems  All other systems reviewed and are negative.    Physical Exam Updated Vital Signs BP 162/58   Pulse 68    Temp 97.9 F (36.6 C) (Oral)   Resp 16   SpO2 100%   Physical Exam  Constitutional: She is oriented to person, place, and time. She appears well-developed and well-nourished. No distress.  HENT:  Head: Normocephalic and atraumatic.  Eyes: EOM are normal.  Neck: Normal range of motion.  Cardiovascular: Normal rate, regular rhythm and normal heart sounds.   Pulmonary/Chest: Effort normal and breath sounds normal.  Abdominal: Soft. She exhibits no distension. There is no tenderness.  Musculoskeletal: Normal range of motion.  Neurological: She is alert and oriented to person, place, and time.  Skin: Skin is warm and dry.  Psychiatric: She has a normal mood and affect. Judgment normal.  Nursing note and vitals reviewed.    ED Treatments / Results  Labs (all labs ordered are listed, but only abnormal results are displayed) Labs Reviewed  COMPREHENSIVE METABOLIC PANEL - Abnormal; Notable for the following:       Result Value   CO2 20 (*)    Glucose, Bld 149 (*)    Creatinine, Ser 1.14 (*)    ALT 8 (*)    Total Bilirubin 0.2 (*)    GFR calc non Af Amer 45 (*)    GFR calc Af Amer 52 (*)    All other components within normal limits  URINALYSIS, ROUTINE W REFLEX MICROSCOPIC (NOT AT [facility]) - Abnormal; Notable for the following:    Color, Urine AMBER (*)    APPearance CLOUDY (*)    Bilirubin Urine SMALL (*)    Protein, ur 30 (*)    Nitrite POSITIVE (*)    Leukocytes, UA MODERATE (*)    All other components within normal limits  URINE MICROSCOPIC-ADD ON - Abnormal; Notable for the following:    Squamous Epithelial / LPF 0-5 (*)    Bacteria, UA MANY (*)    All other components within normal limits  LIPASE, BLOOD  CBC    EKG  EKG Interpretation None       Radiology No results found.  Procedures Procedures (including critical care time)  Medications Ordered in ED Medications  cephALEXin (KEFLEX) capsule 500 mg (not administered)  ondansetron (ZOFRAN-ODT)  disintegrating tablet 8 mg (8 mg Oral Given 04/06/16 2051)     Initial Impression / Assessment and Plan / ED Course  I have reviewed the triage vital signs and the nursing notes.  Pertinent labs & imaging results that were available during my care of the patient were reviewed by me and considered in my medical decision making (see chart for details).  Clinical Course    Patiently treated for  urinary tract infection.  Urine culture sent.Overall well-appearing.  Vital signs without significant abnormality.  Primary care follow-up.  She understands to return to the ER for new or worsening symptoms  Final Clinical Impressions(s) / ED Diagnoses   Final diagnoses:  Nausea and vomiting, vomiting of unspecified type  Acute cystitis without hematuria    New Prescriptions New Prescriptions   CEPHALEXIN (KEFLEX) 500 MG CAPSULE    Take 1 capsule (500 mg total) by mouth 3 (three) times daily.     Jola Schmidt, MD 04/06/16 2152

## 2016-04-06 NOTE — ED Triage Notes (Signed)
Pt taking Phenergan for nausea with relief.  Pt thinks it "may be pancreatitis".

## 2016-04-08 LAB — URINE CULTURE

## 2016-04-19 ENCOUNTER — Ambulatory Visit
Admission: RE | Admit: 2016-04-19 | Discharge: 2016-04-19 | Disposition: A | Discharge status: Home / Self Care | Payer: Medicare Other | Source: Ambulatory Visit | Attending: Cardiovascular Disease | Admitting: Cardiovascular Disease

## 2016-04-19 ENCOUNTER — Other Ambulatory Visit: Payer: Self-pay | Admitting: Cardiovascular Disease

## 2016-04-19 DIAGNOSIS — M25471 Effusion, right ankle: Secondary | ICD-10-CM

## 2016-04-19 DIAGNOSIS — M25571 Pain in right ankle and joints of right foot: Secondary | ICD-10-CM

## 2016-10-22 ENCOUNTER — Ambulatory Visit (HOSPITAL_COMMUNITY)
Admission: EM | Admit: 2016-10-22 | Discharge: 2016-10-22 | Disposition: A | Discharge status: Home / Self Care | Payer: Medicare Other | Attending: Internal Medicine | Admitting: Internal Medicine

## 2016-10-22 ENCOUNTER — Encounter (HOSPITAL_COMMUNITY): Payer: Self-pay

## 2016-10-22 DIAGNOSIS — R42 Dizziness and giddiness: Secondary | ICD-10-CM | POA: Diagnosis not present

## 2016-10-22 DIAGNOSIS — R202 Paresthesia of skin: Secondary | ICD-10-CM | POA: Diagnosis not present

## 2016-10-22 LAB — POCT I-STAT, CHEM 8
BUN: 22 mg/dL — ABNORMAL HIGH (ref 6–20)
CHLORIDE: 109 mmol/L (ref 101–111)
CREATININE: 1.2 mg/dL — AB (ref 0.44–1.00)
Calcium, Ion: 1.16 mmol/L (ref 1.15–1.40)
Glucose, Bld: 113 mg/dL — ABNORMAL HIGH (ref 65–99)
HEMATOCRIT: 35 % — AB (ref 36.0–46.0)
Hemoglobin: 11.9 g/dL — ABNORMAL LOW (ref 12.0–15.0)
Potassium: 4.2 mmol/L (ref 3.5–5.1)
SODIUM: 144 mmol/L (ref 135–145)
TCO2: 26 mmol/L (ref 0–100)

## 2016-10-22 NOTE — ED Provider Notes (Signed)
CSN: [id number]     Arrival date & time 10/22/16  1450 History   First MD Initiated Contact with Patient 10/22/16 1459     Chief Complaint  Patient presents with  . Dizziness   (Consider location/radiation/quality/duration/timing/severity/associated sxs/prior Treatment) 82 year old female patient of Dr. Karlton Lemon with a history of diabetes mellitus type 2, hypertension and CVA was sent to the urgent care for evaluation of dizziness for 2 months. It was suggested that maybe her blood pressure medicines may be an etiology. She was not able to see her PCP today. She had been prescribed meclizine however that has not helped. She does not have dizziness every day but on most days of the week. It is not associated with nausea, vomiting, changes in vision or speech. She denies vertiginous symptoms. She states she uses a cane to walk but has not fallen in association with the dizziness. Denies shortness of breath. She does state that she has heaviness in the chest and palpitations on occasion but her cardiologist is fully aware of this and treats her for angina.  Second complaint is that of numbness to the dorsum of the right foot for a little less than a week. Denies any known injury. There is no pain. Denies focal weakness.      Past Medical History:  Diagnosis Date  . Diabetes mellitus   . Hypertension   . Stroke [facility])    Past Surgical History:  Procedure Laterality Date  . ABDOMINAL HYSTERECTOMY    . ANKLE SURGERY    . TOTAL KNEE ARTHROPLASTY     Family History  Problem Relation Age of Onset  . Diabetes Mother   . Diabetes Sister   . Hyperlipidemia Sister   . Diabetes Brother   . Diabetes Son    Social History  Substance Use Topics  . Smoking status: Current Every Day Smoker    Packs/day: 0.50    Years: 12.00    Types: Cigarettes  . Smokeless tobacco: Never Used     Comment: Taking Wellbutrin  . Alcohol use No   OB History    No data available     Review of Systems   Constitutional: Negative for activity change, fatigue and fever.  HENT: Negative.   Eyes: Positive for discharge. Negative for pain and visual disturbance.  Respiratory: Negative.   Cardiovascular: Positive for chest pain and palpitations.       See history of present illness  Gastrointestinal: Negative.   Genitourinary: Negative for dysuria and frequency.  Musculoskeletal:       No musculoskeletal complaints  Skin: Negative.   Neurological: Positive for dizziness and numbness. Negative for tremors, syncope, speech difficulty and weakness.       On occasion has mild frontal headache.  Psychiatric/Behavioral: Negative.     Allergies  Codeine and Penicillins  Home Medications   Prior to Admission medications   Medication Sig Start Date End Date Taking? Authorizing Provider  amLODipine (NORVASC) 10 MG tablet Take 10 mg by mouth daily.     Yes Historical Provider, MD  aspirin EC 81 MG tablet Take 81 mg by mouth daily.     Yes Historical Provider, MD  atorvastatin (LIPITOR) 10 MG tablet Take 10 mg by mouth daily. 10/04/15  Yes Historical Provider, MD  hydrOXYzine (ATARAX/VISTARIL) 25 MG tablet Take 25 mg by mouth 3 (three) times daily as needed.   Yes Historical Provider, MD  isosorbide dinitrate (ISORDIL) 20 MG tablet Take 20 mg by mouth 3 (three) times daily.  Yes Historical Provider, MD  meclizine (ANTIVERT) 12.5 MG tablet Take 12.5 mg by mouth 3 (three) times daily as needed for dizziness.   Yes Historical Provider, MD  metoprolol succinate (TOPROL-XL) 25 MG 24 hr tablet Take 25 mg by mouth daily.     Yes Historical Provider, MD  pioglitazone (ACTOS) 30 MG tablet Take 30 mg by mouth daily.   Yes Historical Provider, MD  valsartan (DIOVAN) 160 MG tablet Take 160 mg by mouth daily.   Yes Historical Provider, MD  albuterol (PROVENTIL HFA;VENTOLIN HFA) 108 (90 BASE) MCG/ACT inhaler Inhale 2 puffs into the lungs 4 (four) times daily. 07/28/13   Harden Mo, MD  benzonatate (TESSALON)  100 MG capsule Take 100-200 mg by mouth 3 (three) times daily as needed for cough.    Historical Provider, MD  cephALEXin (KEFLEX) 500 MG capsule Take 1 capsule (500 mg total) by mouth 3 (three) times daily. 04/06/16   Jola Schmidt, MD  cyclobenzaprine (FLEXERIL) 5 MG tablet Take 1 tablet (5 mg total) by mouth 3 (three) times daily as needed for muscle spasms. 10/15/15   Gareth Morgan, MD  Dextromethorphan Polistirex (DELSYM PO) Take 15 mLs by mouth daily as needed. For cough per patient    Historical Provider, MD  metFORMIN (GLUCOPHAGE) 500 MG tablet Take 500 mg by mouth 2 (two) times daily with a meal.      Historical Provider, MD  nitroGLYCERIN (NITROSTAT) 0.4 MG SL tablet TK 1 T UNDER THE TONGUE Q 5 MINUTES X 3 DOSES PRF CHEST PAIN 07/29/15   Historical Provider, MD   Meds Ordered and Administered this Visit  Medications - No data to display  BP (!) 161/60 (BP Location: Right Arm)   Pulse 69   Temp 97.8 F (36.6 C) (Oral)   Resp 20   SpO2 99%  Orthostatic VS for the past 24 hrs:  BP- Lying Pulse- Lying BP- Sitting Pulse- Sitting BP- Standing at 0 minutes Pulse- Standing at 0 minutes  10/22/16 1605 156/58 64 152/59 68 143/58 70    Physical Exam  Constitutional: She is oriented to person, place, and time. She appears well-developed and well-nourished. No distress.  HENT:  Head: Normocephalic and atraumatic.  Right Ear: External ear normal.  Left Ear: External ear normal.  Mouth/Throat: Oropharynx is clear and moist. No oropharyngeal exudate.  Eyes: Conjunctivae and EOM are normal. Pupils are equal, round, and reactive to light. Right eye exhibits no discharge. Left eye exhibits no discharge.  Gaze is conjugate however there is a lag of left and right movement of the left eye. There is mild ptosis of the left eye lid. Patient states that she has had this for a long time. Likely secondary to prior CVA.  Neck: Normal range of motion. Neck supple.  Negative for carotid bruits. Carotid  pulses 2+.  Cardiovascular: Normal rate, regular rhythm, normal heart sounds and intact distal pulses.   Pulmonary/Chest: Effort normal and breath sounds normal. No respiratory distress. She has no wheezes. She has no rales.  Abdominal: Soft. There is no tenderness.  Musculoskeletal: Normal range of motion. She exhibits no edema or tenderness.  Lymphadenopathy:    She has no cervical adenopathy.  Neurological: She is alert and oriented to person, place, and time. She has normal strength. She displays no tremor. No cranial nerve deficit or sensory deficit. She exhibits normal muscle tone. Gait normal. GCS eye subscore is 4. GCS verbal subscore is 5. GCS motor subscore is 6.  Right foot  exam reveals no edema, deformity or discoloration. She is able to sense light Q-tip touch as well as sharp touch. Distal motor sensory is grossly intact. The foot is warm,  pulse is 1+. No evidence of circulatory deficit.  Negative Romberg. Patient sways but able to maintains station. Unable to perform tandem gait without falling out of line. Able to get onto and off the exam table without assistance.  Skin: Skin is warm and dry. Capillary refill takes less than 2 seconds. No rash noted. No erythema.  Psychiatric: She has a normal mood and affect. Her behavior is normal. Thought content normal.  Nursing note and vitals reviewed.   Urgent Care Course     Procedures (including critical care time)  Labs Review Labs Reviewed  POCT I-STAT, CHEM 8 - Abnormal; Notable for the following:       Result Value   BUN 22 (*)    Creatinine, Ser 1.20 (*)    Glucose, Bld 113 (*)    Hemoglobin 11.9 (*)    HCT 35.0 (*)    All other components within normal limits    Imaging Review No results found.   Visual Acuity Review  Right Eye Distance:   Left Eye Distance:   Bilateral Distance:    Right Eye Near:   Left Eye Near:    Bilateral Near:         MDM   1. Dizziness   2. Paresthesia of right foot     All of your blood work including your EKG and blood pressure in different positions are normal. It is not clear what is causing your dizziness. It certainly may be one of your medications but since many of them have a side effect of dizziness it is difficult to determine which one or more than one AB causing it. I think it is not wise to just arbitrarily choose one to change or discontinue because it might not be the right one. I believe that despite the discomfort of being dizzy that your condition is relatively stable at this time. Recommend that you have your walker at all times when your ambulatory, do not get up too fast and just be extra careful when getting around. Call Dr. Karlton Lemon for an appointment and she may want to do more testing. It was nice to see you again, I hope you get better soon. Post note. He was noted that when the patient was being discharged in her shoes were on her feet she has a tight   band that circles the top of her foot. She was asked not to wear the shoes anymore that that could possibly be causing the numbness.   Janne Napoleon, NP 10/22/16 1635

## 2016-10-22 NOTE — Discharge Instructions (Signed)
All of your blood work including your EKG and blood pressure in different positions are normal. It is not clear what is causing your dizziness. It certainly may be one of your medications but since many of them have a side effect of dizziness it is difficult to determine which one or more than one AB causing it. I think it is not wise to just arbitrarily choose one to change or discontinue because it might not be the right one. I believe that despite the discomfort of being dizzy that your condition is relatively stable at this time. Recommend that you have your walker at all times when your ambulatory, do not get up too fast and just be extra careful when getting around. Call Dr. Karlton Lemon for an appointment and she may want to do more testing. It was nice to see you again, I hope you get better soon.

## 2016-10-22 NOTE — ED Triage Notes (Signed)
Having dizziness for 2 months, has a rx for meclizine but it doesn't help. Said the durations varies and was instructed by her pcp to come here. Currently experiencing it now. Her pcp said it is coming from her bp medication. Also having numbness in her right foot on the top. This has been going on since sat. Did try to elevate it without relief.

## 2016-12-23 ENCOUNTER — Other Ambulatory Visit: Payer: Self-pay | Admitting: Cardiovascular Disease

## 2016-12-23 ENCOUNTER — Ambulatory Visit
Admission: RE | Admit: 2016-12-23 | Discharge: 2016-12-23 | Disposition: A | Discharge status: Home / Self Care | Payer: Medicare Other | Source: Ambulatory Visit | Attending: Cardiovascular Disease | Admitting: Cardiovascular Disease

## 2016-12-23 DIAGNOSIS — R079 Chest pain, unspecified: Secondary | ICD-10-CM

## 2016-12-29 ENCOUNTER — Encounter (HOSPITAL_COMMUNITY): Payer: Self-pay | Admitting: General Practice

## 2016-12-29 ENCOUNTER — Observation Stay (HOSPITAL_BASED_OUTPATIENT_CLINIC_OR_DEPARTMENT_OTHER)
Admission: AD | Admit: 2016-12-29 | Discharge: 2016-12-31 | Disposition: A | Discharge status: Home / Self Care | Payer: Medicare Other | Source: Ambulatory Visit | Attending: Cardiovascular Disease | Admitting: Cardiovascular Disease

## 2016-12-29 DIAGNOSIS — F17200 Nicotine dependence, unspecified, uncomplicated: Secondary | ICD-10-CM | POA: Insufficient documentation

## 2016-12-29 DIAGNOSIS — Z885 Allergy status to narcotic agent status: Secondary | ICD-10-CM | POA: Insufficient documentation

## 2016-12-29 DIAGNOSIS — Z7984 Long term (current) use of oral hypoglycemic drugs: Secondary | ICD-10-CM | POA: Insufficient documentation

## 2016-12-29 DIAGNOSIS — E119 Type 2 diabetes mellitus without complications: Secondary | ICD-10-CM

## 2016-12-29 DIAGNOSIS — C3412 Malignant neoplasm of upper lobe, left bronchus or lung: Secondary | ICD-10-CM | POA: Diagnosis not present

## 2016-12-29 DIAGNOSIS — R0602 Shortness of breath: Secondary | ICD-10-CM

## 2016-12-29 DIAGNOSIS — Z7982 Long term (current) use of aspirin: Secondary | ICD-10-CM | POA: Insufficient documentation

## 2016-12-29 DIAGNOSIS — Z880 Allergy status to penicillin: Secondary | ICD-10-CM

## 2016-12-29 DIAGNOSIS — E669 Obesity, unspecified: Secondary | ICD-10-CM | POA: Diagnosis not present

## 2016-12-29 DIAGNOSIS — Z8673 Personal history of transient ischemic attack (TIA), and cerebral infarction without residual deficits: Secondary | ICD-10-CM | POA: Insufficient documentation

## 2016-12-29 DIAGNOSIS — I119 Hypertensive heart disease without heart failure: Secondary | ICD-10-CM | POA: Insufficient documentation

## 2016-12-29 DIAGNOSIS — Z96659 Presence of unspecified artificial knee joint: Secondary | ICD-10-CM | POA: Diagnosis not present

## 2016-12-29 DIAGNOSIS — I129 Hypertensive chronic kidney disease with stage 1 through stage 4 chronic kidney disease, or unspecified chronic kidney disease: Secondary | ICD-10-CM | POA: Diagnosis not present

## 2016-12-29 DIAGNOSIS — I2510 Atherosclerotic heart disease of native coronary artery without angina pectoris: Secondary | ICD-10-CM | POA: Insufficient documentation

## 2016-12-29 DIAGNOSIS — F17210 Nicotine dependence, cigarettes, uncomplicated: Secondary | ICD-10-CM | POA: Diagnosis not present

## 2016-12-29 DIAGNOSIS — E1122 Type 2 diabetes mellitus with diabetic chronic kidney disease: Secondary | ICD-10-CM | POA: Diagnosis not present

## 2016-12-29 DIAGNOSIS — N182 Chronic kidney disease, stage 2 (mild): Secondary | ICD-10-CM | POA: Diagnosis not present

## 2016-12-29 DIAGNOSIS — Z79899 Other long term (current) drug therapy: Secondary | ICD-10-CM | POA: Diagnosis not present

## 2016-12-29 DIAGNOSIS — I10 Essential (primary) hypertension: Secondary | ICD-10-CM | POA: Diagnosis present

## 2016-12-29 HISTORY — DX: Unspecified osteoarthritis, unspecified site: M19.90

## 2016-12-29 HISTORY — DX: Dyspnea, unspecified: R06.00

## 2016-12-29 HISTORY — DX: Atherosclerotic heart disease of native coronary artery without angina pectoris: I25.10

## 2016-12-29 LAB — CBC WITH DIFFERENTIAL/PLATELET
BASOS ABS: 0 10*3/uL (ref 0.0–0.1)
Basophils Relative: 0 %
Eosinophils Absolute: 0.2 10*3/uL (ref 0.0–0.7)
Eosinophils Relative: 4 %
HEMATOCRIT: 34.8 % — AB (ref 36.0–46.0)
Hemoglobin: 11 g/dL — ABNORMAL LOW (ref 12.0–15.0)
LYMPHS ABS: 1.9 10*3/uL (ref 0.7–4.0)
LYMPHS PCT: 34 %
MCH: 25.5 pg — AB (ref 26.0–34.0)
MCHC: 31.6 g/dL (ref 30.0–36.0)
MCV: 80.7 fL (ref 78.0–100.0)
MONO ABS: 0.4 10*3/uL (ref 0.1–1.0)
Monocytes Relative: 6 %
NEUTROS ABS: 3.2 10*3/uL (ref 1.7–7.7)
Neutrophils Relative %: 56 %
Platelets: 259 10*3/uL (ref 150–400)
RBC: 4.31 MIL/uL (ref 3.87–5.11)
RDW: 15.5 % (ref 11.5–15.5)
WBC: 5.7 10*3/uL (ref 4.0–10.5)

## 2016-12-29 LAB — COMPREHENSIVE METABOLIC PANEL
ALBUMIN: 3.9 g/dL (ref 3.5–5.0)
ALT: 10 U/L — ABNORMAL LOW (ref 14–54)
ANION GAP: 10 (ref 5–15)
AST: 17 U/L (ref 15–41)
Alkaline Phosphatase: 45 U/L (ref 38–126)
BUN: 17 mg/dL (ref 6–20)
CO2: 20 mmol/L — AB (ref 22–32)
Calcium: 9.5 mg/dL (ref 8.9–10.3)
Chloride: 109 mmol/L (ref 101–111)
Creatinine, Ser: 1.17 mg/dL — ABNORMAL HIGH (ref 0.44–1.00)
GFR calc Af Amer: 50 mL/min — ABNORMAL LOW (ref >60)
GFR calc non Af Amer: 43 mL/min — ABNORMAL LOW (ref >60)
GLUCOSE: 154 mg/dL — AB (ref 65–99)
POTASSIUM: 4 mmol/L (ref 3.5–5.1)
Sodium: 139 mmol/L (ref 135–145)
TOTAL PROTEIN: 7.8 g/dL (ref 6.5–8.1)
Total Bilirubin: 0.4 mg/dL (ref 0.3–1.2)

## 2016-12-29 LAB — GLUCOSE, CAPILLARY
GLUCOSE-CAPILLARY: 138 mg/dL — AB (ref 65–99)
GLUCOSE-CAPILLARY: 92 mg/dL (ref 65–99)
Glucose-Capillary: 94 mg/dL (ref 65–99)

## 2016-12-29 MED ORDER — ALBUTEROL SULFATE (2.5 MG/3ML) 0.083% IN NEBU: 2.5000 mg | INHALATION_SOLUTION | Freq: Four times a day (QID) | RESPIRATORY_TRACT | Status: DC | PRN

## 2016-12-29 MED ORDER — METOPROLOL SUCCINATE ER 25 MG PO TB24
25.0000 mg | ORAL_TABLET | Freq: Every day | ORAL | Status: DC
  Administered 2016-12-29: 25 mg via ORAL
  Filled 2016-12-29 (×2): qty 1

## 2016-12-29 MED ORDER — ONDANSETRON HCL 4 MG/2ML IJ SOLN: 4.0000 mg | Freq: Four times a day (QID) | INTRAMUSCULAR | Status: DC | PRN

## 2016-12-29 MED ORDER — ASPIRIN EC 81 MG PO TBEC
81.0000 mg | DELAYED_RELEASE_TABLET | Freq: Every day | ORAL | Status: DC
  Administered 2016-12-29 – 2016-12-31 (×3): 81 mg via ORAL
  Filled 2016-12-29 (×3): qty 1

## 2016-12-29 MED ORDER — IRBESARTAN 150 MG PO TABS: 150.0000 mg | ORAL_TABLET | Freq: Every day | ORAL | Status: DC

## 2016-12-29 MED ORDER — ACETAMINOPHEN 650 MG RE SUPP: 650.0000 mg | Freq: Four times a day (QID) | RECTAL | Status: DC | PRN

## 2016-12-29 MED ORDER — SODIUM CHLORIDE 0.9% FLUSH
3.0000 mL | Freq: Two times a day (BID) | INTRAVENOUS | Status: DC
  Administered 2016-12-30 – 2016-12-31 (×2): 3 mL via INTRAVENOUS

## 2016-12-29 MED ORDER — ACETAMINOPHEN 325 MG PO TABS
650.0000 mg | ORAL_TABLET | Freq: Four times a day (QID) | ORAL | Status: DC | PRN
Start: 2016-12-29 — End: 2016-12-31
  Administered 2016-12-29 – 2016-12-31 (×5): 650 mg via ORAL
  Filled 2016-12-29 (×5): qty 2

## 2016-12-29 MED ORDER — SODIUM CHLORIDE 0.9% FLUSH
3.0000 mL | Freq: Two times a day (BID) | INTRAVENOUS | Status: DC
  Administered 2016-12-29 – 2016-12-30 (×4): 3 mL via INTRAVENOUS

## 2016-12-29 MED ORDER — BENZONATATE 100 MG PO CAPS: 100.0000 mg | ORAL_CAPSULE | Freq: Three times a day (TID) | ORAL | Status: DC | PRN

## 2016-12-29 MED ORDER — AMLODIPINE BESYLATE 10 MG PO TABS: 10.0000 mg | ORAL_TABLET | Freq: Every day | ORAL | Status: DC

## 2016-12-29 MED ORDER — NITROGLYCERIN 0.4 MG SL SUBL: 0.4000 mg | SUBLINGUAL_TABLET | SUBLINGUAL | Status: DC | PRN

## 2016-12-29 MED ORDER — ISOSORBIDE DINITRATE 10 MG PO TABS
20.0000 mg | ORAL_TABLET | Freq: Three times a day (TID) | ORAL | Status: DC
  Administered 2016-12-29 – 2016-12-31 (×6): 20 mg via ORAL
  Filled 2016-12-29 (×6): qty 2

## 2016-12-29 MED ORDER — MECLIZINE HCL 25 MG PO TABS: 12.5000 mg | ORAL_TABLET | Freq: Three times a day (TID) | ORAL | Status: DC | PRN

## 2016-12-29 MED ORDER — ENOXAPARIN SODIUM 40 MG/0.4ML ~~LOC~~ SOLN
40.0000 mg | SUBCUTANEOUS | Status: DC
  Administered 2016-12-30 – 2016-12-31 (×2): 40 mg via SUBCUTANEOUS
  Filled 2016-12-29 (×3): qty 0.4

## 2016-12-29 MED ORDER — PIOGLITAZONE HCL 30 MG PO TABS
30.0000 mg | ORAL_TABLET | Freq: Every day | ORAL | Status: DC
  Administered 2016-12-29 – 2016-12-31 (×3): 30 mg via ORAL
  Filled 2016-12-29 (×3): qty 1

## 2016-12-29 MED ORDER — CYCLOBENZAPRINE HCL 10 MG PO TABS: 5.0000 mg | ORAL_TABLET | Freq: Three times a day (TID) | ORAL | Status: DC | PRN

## 2016-12-29 MED ORDER — ATORVASTATIN CALCIUM 10 MG PO TABS
10.0000 mg | ORAL_TABLET | Freq: Every day | ORAL | Status: DC
  Administered 2016-12-29 – 2016-12-30 (×2): 10 mg via ORAL
  Filled 2016-12-29 (×2): qty 1

## 2016-12-29 MED ORDER — DEXTROSE 5 % IV SOLN
500.0000 mg | INTRAVENOUS | Status: AC
  Administered 2016-12-29 – 2016-12-31 (×3): 500 mg via INTRAVENOUS
  Filled 2016-12-29 (×3): qty 500

## 2016-12-29 MED ORDER — ONDANSETRON HCL 4 MG PO TABS: 4.0000 mg | ORAL_TABLET | Freq: Four times a day (QID) | ORAL | Status: DC | PRN

## 2016-12-29 MED ORDER — SODIUM CHLORIDE 0.9 % IV SOLN
250.0000 mL | INTRAVENOUS | Status: DC | PRN
  Administered 2016-12-29: 250 mL via INTRAVENOUS

## 2016-12-29 MED ORDER — SODIUM CHLORIDE 0.9% FLUSH: 3.0000 mL | INTRAVENOUS | Status: DC | PRN

## 2016-12-29 MED ORDER — METFORMIN HCL 500 MG PO TABS
500.0000 mg | ORAL_TABLET | Freq: Two times a day (BID) | ORAL | Status: DC
  Administered 2016-12-29 – 2016-12-30 (×2): 500 mg via ORAL
  Filled 2016-12-29 (×2): qty 1

## 2016-12-29 NOTE — H&P (Signed)
Referring Physician:  MAURYA NETHERY is an 82 y.o. female.                       Chief Complaint: Shortness of breath  HPI: 82 year old female with 1 week of shortness of breath had chest x-ray suggestive of pneumonia or lung mass. She has PMH of DM, II, HTN, Tobacco use disorder and Stroke.  Past Medical History:  Diagnosis Date  . Diabetes mellitus   . Hypertension   . Stroke [facility])       Past Surgical History:  Procedure Laterality Date  . ABDOMINAL HYSTERECTOMY    . ANKLE SURGERY    . TOTAL KNEE ARTHROPLASTY      Family History  Problem Relation Age of Onset  . Diabetes Mother   . Diabetes Sister   . Hyperlipidemia Sister   . Diabetes Brother   . Diabetes Son    Social History:  reports that she has been smoking Cigarettes.  She has a 6.00 pack-year smoking history. She has never used smokeless tobacco. She reports that she does not drink alcohol or use drugs.  Allergies:  Allergies  Allergen Reactions  . Codeine Rash  . Penicillins Rash    Has patient had a PCN reaction causing immediate rash, facial/tongue/throat swelling, SOB or lightheadedness with hypotension: {Yes Has patient had a PCN reaction causing severe rash involving mucus membranes or skin necrosis: NO Has patient had a PCN reaction that required hospitalization NO Has patient had a PCN reaction occurring within the last 10 years: NO If all of the above answers are "NO", then may proceed with Cephalosporin use.    Medications Prior to Admission  Medication Sig Dispense Refill  . albuterol (PROVENTIL HFA;VENTOLIN HFA) 108 (90 BASE) MCG/ACT inhaler Inhale 2 puffs into the lungs 4 (four) times daily. 1 Inhaler 0  . amLODipine (NORVASC) 10 MG tablet Take 10 mg by mouth daily.      Marland Kitchen aspirin EC 81 MG tablet Take 81 mg by mouth daily.      Marland Kitchen atorvastatin (LIPITOR) 10 MG tablet Take 10 mg by mouth daily.  5  . benzonatate (TESSALON) 100 MG capsule Take 100-200 mg by mouth 3 (three) times daily as needed  for cough.    . cephALEXin (KEFLEX) 500 MG capsule Take 1 capsule (500 mg total) by mouth 3 (three) times daily. 21 capsule 0  . cyclobenzaprine (FLEXERIL) 5 MG tablet Take 1 tablet (5 mg total) by mouth 3 (three) times daily as needed for muscle spasms. 20 tablet 0  . Dextromethorphan Polistirex (DELSYM PO) Take 15 mLs by mouth daily as needed. For cough per patient    . hydrOXYzine (ATARAX/VISTARIL) 25 MG tablet Take 25 mg by mouth 3 (three) times daily as needed.    . isosorbide dinitrate (ISORDIL) 20 MG tablet Take 20 mg by mouth 3 (three) times daily.    . meclizine (ANTIVERT) 12.5 MG tablet Take 12.5 mg by mouth 3 (three) times daily as needed for dizziness.    . metFORMIN (GLUCOPHAGE) 500 MG tablet Take 500 mg by mouth 2 (two) times daily with a meal.      . metoprolol succinate (TOPROL-XL) 25 MG 24 hr tablet Take 25 mg by mouth daily.      . nitroGLYCERIN (NITROSTAT) 0.4 MG SL tablet TK 1 T UNDER THE TONGUE Q 5 MINUTES X 3 DOSES PRF CHEST PAIN  0  . pioglitazone (ACTOS) 30 MG tablet Take 30  mg by mouth daily.    . valsartan (DIOVAN) 160 MG tablet Take 160 mg by mouth daily.      No results found for this or any previous visit (from the past 48 hour(s)). No results found.  Review Of Systems Constitutional: No fever, chills, weight loss or gain. Eyes: No vision change, wears glasses. No discharge or pain. Ears: No hearing loss, No tinnitus. Respiratory: No asthma, COPD, pneumonias. Positive shortness of breath. No hemoptysis. Cardiovascular: Positive chest pain, palpitation, leg edema. Gastrointestinal: No nausea, vomiting, diarrhea, constipation. No GI bleed. No hepatitis. Genitourinary: No dysuria, hematuria, kidney stone. No incontinance. Neurological: No headache, Positive stroke, seizures.  Psychiatry: No psych facility admission for anxiety, depression, suicide. No detox. Skin: No rash. Musculoskeletal: Positive joint pain, fibromyalgia. No neck pain, back  pain. Lymphadenopathy: No lymphadenopathy. Hematology: No anemia or easy bruising.   Blood pressure (!) 101/52, pulse 83, temperature 98.3 F (36.8 C), temperature source Oral, resp. rate 14, height 5\' 9"  (1.753 m), weight 88.6 kg (195 lb 4.8 oz), SpO2 100 %. Body mass index is 28.84 kg/m. General appearance: alert, cooperative, appears stated age and no distress Head: Normocephalic, atraumatic. Eyes: Brown eyes, pink conjunctiva, corneas clear. Wears glasses. PERRL, EOM's intact. Neck: No adenopathy, no carotid bruit, no JVD, supple, symmetrical, trachea midline and thyroid not enlarged. Resp: Few rhonchi to auscultation bilaterally. Cardio: Regular rate and rhythm, S1, S2 normal, II/VI systolic murmur, no click, rub or gallop GI: Soft, non-tender; bowel sounds normal; no organomegaly. Extremities: No edema, cyanosis or clubbing. Skin: Warm and dry.  Neurologic: Alert and oriented X 3, normal strength. Normal coordination and slow gait with cane use.  Assessment/Plan Shortness of breath Possible pneumonia CAD DM, II Obesity HTN  Place in observation CT chest IV azithromycin.  Birdie Riddle, MD  12/29/2016, 12:37 PM

## 2016-12-30 ENCOUNTER — Observation Stay (HOSPITAL_COMMUNITY): Payer: Medicare Other | Attending: Cardiovascular Disease

## 2016-12-30 DIAGNOSIS — Z8673 Personal history of transient ischemic attack (TIA), and cerebral infarction without residual deficits: Secondary | ICD-10-CM | POA: Insufficient documentation

## 2016-12-30 DIAGNOSIS — E669 Obesity, unspecified: Secondary | ICD-10-CM | POA: Insufficient documentation

## 2016-12-30 DIAGNOSIS — N182 Chronic kidney disease, stage 2 (mild): Secondary | ICD-10-CM | POA: Insufficient documentation

## 2016-12-30 DIAGNOSIS — E1122 Type 2 diabetes mellitus with diabetic chronic kidney disease: Secondary | ICD-10-CM | POA: Insufficient documentation

## 2016-12-30 DIAGNOSIS — I2510 Atherosclerotic heart disease of native coronary artery without angina pectoris: Secondary | ICD-10-CM | POA: Insufficient documentation

## 2016-12-30 DIAGNOSIS — Z96659 Presence of unspecified artificial knee joint: Secondary | ICD-10-CM | POA: Insufficient documentation

## 2016-12-30 DIAGNOSIS — Z7984 Long term (current) use of oral hypoglycemic drugs: Secondary | ICD-10-CM | POA: Insufficient documentation

## 2016-12-30 DIAGNOSIS — F17210 Nicotine dependence, cigarettes, uncomplicated: Secondary | ICD-10-CM | POA: Insufficient documentation

## 2016-12-30 DIAGNOSIS — I129 Hypertensive chronic kidney disease with stage 1 through stage 4 chronic kidney disease, or unspecified chronic kidney disease: Secondary | ICD-10-CM | POA: Insufficient documentation

## 2016-12-30 DIAGNOSIS — C3412 Malignant neoplasm of upper lobe, left bronchus or lung: Secondary | ICD-10-CM | POA: Diagnosis not present

## 2016-12-30 DIAGNOSIS — Z79899 Other long term (current) drug therapy: Secondary | ICD-10-CM | POA: Insufficient documentation

## 2016-12-30 DIAGNOSIS — Z7982 Long term (current) use of aspirin: Secondary | ICD-10-CM | POA: Insufficient documentation

## 2016-12-30 LAB — CBC WITH DIFFERENTIAL/PLATELET
BASOS PCT: 0 %
Basophils Absolute: 0 10*3/uL (ref 0.0–0.1)
Eosinophils Absolute: 0.3 10*3/uL (ref 0.0–0.7)
Eosinophils Relative: 6 %
HEMATOCRIT: 31.3 % — AB (ref 36.0–46.0)
HEMOGLOBIN: 9.9 g/dL — AB (ref 12.0–15.0)
LYMPHS ABS: 2.4 10*3/uL (ref 0.7–4.0)
LYMPHS PCT: 46 %
MCH: 25.4 pg — AB (ref 26.0–34.0)
MCHC: 31.6 g/dL (ref 30.0–36.0)
MCV: 80.3 fL (ref 78.0–100.0)
MONO ABS: 0.3 10*3/uL (ref 0.1–1.0)
MONOS PCT: 6 %
NEUTROS PCT: 42 %
Neutro Abs: 2.1 10*3/uL (ref 1.7–7.7)
Platelets: 238 10*3/uL (ref 150–400)
RBC: 3.9 MIL/uL (ref 3.87–5.11)
RDW: 15.5 % (ref 11.5–15.5)
WBC: 5.1 10*3/uL (ref 4.0–10.5)

## 2016-12-30 LAB — FERRITIN: Ferritin: 53 ng/mL (ref 11–307)

## 2016-12-30 LAB — IRON AND TIBC
Iron: 46 ug/dL (ref 28–170)
SATURATION RATIOS: 16 % (ref 10.4–31.8)
TIBC: 295 ug/dL (ref 250–450)
UIBC: 249 ug/dL

## 2016-12-30 LAB — BASIC METABOLIC PANEL
ANION GAP: 10 (ref 5–15)
BUN: 23 mg/dL — ABNORMAL HIGH (ref 6–20)
CHLORIDE: 111 mmol/L (ref 101–111)
CO2: 17 mmol/L — AB (ref 22–32)
Calcium: 8.6 mg/dL — ABNORMAL LOW (ref 8.9–10.3)
Creatinine, Ser: 1.14 mg/dL — ABNORMAL HIGH (ref 0.44–1.00)
GFR calc Af Amer: 52 mL/min — ABNORMAL LOW (ref >60)
GFR calc non Af Amer: 45 mL/min — ABNORMAL LOW (ref >60)
GLUCOSE: 82 mg/dL (ref 65–99)
Potassium: 5.1 mmol/L (ref 3.5–5.1)
Sodium: 138 mmol/L (ref 135–145)

## 2016-12-30 LAB — GLUCOSE, CAPILLARY
GLUCOSE-CAPILLARY: 100 mg/dL — AB (ref 65–99)
Glucose-Capillary: 100 mg/dL — ABNORMAL HIGH (ref 65–99)
Glucose-Capillary: 94 mg/dL (ref 65–99)
Glucose-Capillary: 89 mg/dL (ref 65–99)

## 2016-12-30 MED ORDER — METFORMIN HCL 500 MG PO TABS: 500.0000 mg | ORAL_TABLET | Freq: Two times a day (BID) | ORAL | Status: DC

## 2016-12-30 MED ORDER — IOPAMIDOL (ISOVUE-300) INJECTION 61%
INTRAVENOUS | Status: AC
  Filled 2016-12-30: qty 75

## 2016-12-30 NOTE — Care Management Note (Signed)
Case Management Note  Patient Details  Name: CHABELI BARSAMIAN MRN: [id number] Date of Birth: June 06, 1935  Subjective/Objective: Pt presented for SOB-Chest Xray per MD notes question Pneumonia. Initiated on IV Azithromycin. PTA pt was independent with use of cane. Pt has additional DME: [employer].                  Action/Plan: Pt is from home with son and plan will be to return home once stable. No home needs identified.   Expected Discharge Date:                  Expected Discharge Plan:  Home/Self Care  In-House Referral:  NA  Discharge planning Services  CM Consult  Post Acute Care Choice:  NA Choice offered to:  NA  DME Arranged:  N/A DME Agency:  NA  HH Arranged:  NA HH Agency:  NA  Status of Service:  Completed, signed off  If discussed at [place] of Stay Meetings, dates discussed:    Additional Comments:  Bethena Roys, RN 12/30/2016, 10:26 AM

## 2016-12-30 NOTE — Care Management Obs Status (Signed)
[place] NOTIFICATION   Patient Details  Name: [name] [name] MRN: [id number] Date of Birth: 10-Jan-1935   Medicare Observation Status Notification Given:  Yes    Bethena Roys, RN 12/30/2016, 10:25 AM

## 2016-12-30 NOTE — Progress Notes (Signed)
Ref: Willey Blade, MD   Subjective:  Patient made aware of abnormal finding on CT chest with contrastshowing spiculated left upper lobe nodule, consistent with primary bronchogenic carcinoma. Afebrile.  Objective:  Vital Signs in the last 24 hours: Temp:  [97.8 F (36.6 C)-98.1 F (36.7 C)] 98.1 F (36.7 C) (05/31 2133) Pulse Rate:  [48-57] 57 (05/31 2133) Cardiac Rhythm: Sinus bradycardia (05/31 0700) Resp:  [16-21] 21 (05/31 2133) BP: (126-147)/(47-57) 140/51 (05/31 2133) SpO2:  [96 %-99 %] 98 % (05/31 2133) Weight:  [88.2 kg (194 lb 6.4 oz)] 88.2 kg (194 lb 6.4 oz) (05/31 0406)  Physical Exam: BP Readings from Last 1 Encounters:  12/30/16 (!) 140/51    Wt Readings from Last 1 Encounters:  12/30/16 88.2 kg (194 lb 6.4 oz)    Weight change:  Body mass index is 28.71 kg/m. HEENT: [place]/AT, Eyes-Brown, PERL, EOMI, Conjunctiva-Pale pink, Sclera-Non-icteric. Wears glasses. Neck: No JVD, No bruit, Trachea midline. Lungs:  Clear, Bilateral. Cardiac:  Regular rhythm, normal S1 and S2, no S3. II/VI systolic murmur. Abdomen:  Soft, non-tender. BS present. Extremities:  No edema present. No cyanosis. No clubbing. CNS: AxOx3, Cranial nerves grossly intact, moves all 4 extremities.  Skin: Warm and dry.   Intake/Output from previous day: 05/30 0701 - 05/31 0700 In: 620 [P.O.:360; I.V.:10; IV Piggyback:250] Out: 2 [Urine:2]    Lab Results: BMET    Component Value Date/Time   NA 138 12/30/2016 0352   NA 139 12/29/2016 1254   NA 144 10/22/2016 1549   K 5.1 12/30/2016 0352   K 4.0 12/29/2016 1254   K 4.2 10/22/2016 1549   CL 111 12/30/2016 0352   CL 109 12/29/2016 1254   CL 109 10/22/2016 1549   CO2 17 (L) 12/30/2016 0352   CO2 20 (L) 12/29/2016 1254   CO2 20 (L) 04/06/2016 1432   GLUCOSE 82 12/30/2016 0352   GLUCOSE 154 (H) 12/29/2016 1254   GLUCOSE 113 (H) 10/22/2016 1549   BUN 23 (H) 12/30/2016 0352   BUN 17 12/29/2016 1254   BUN 22 (H) 10/22/2016 1549   CREATININE 1.14 (H) 12/30/2016 0352   CREATININE 1.17 (H) 12/29/2016 1254   CREATININE 1.20 (H) 10/22/2016 1549   CALCIUM 8.6 (L) 12/30/2016 0352   CALCIUM 9.5 12/29/2016 1254   CALCIUM 9.4 04/06/2016 1432   GFRNONAA 45 (L) 12/30/2016 0352   GFRNONAA 43 (L) 12/29/2016 1254   GFRNONAA 45 (L) 04/06/2016 1432   GFRAA 52 (L) 12/30/2016 0352   GFRAA 50 (L) 12/29/2016 1254   GFRAA 52 (L) 04/06/2016 1432   CBC    Component Value Date/Time   WBC 5.1 12/30/2016 0629   RBC 3.90 12/30/2016 0629   HGB 9.9 (L) 12/30/2016 0629   HCT 31.3 (L) 12/30/2016 0629   PLT 238 12/30/2016 0629   MCV 80.3 12/30/2016 0629   MCH 25.4 (L) 12/30/2016 0629   MCHC 31.6 12/30/2016 0629   RDW 15.5 12/30/2016 0629   LYMPHSABS 2.4 12/30/2016 0629   MONOABS 0.3 12/30/2016 0629   EOSABS 0.3 12/30/2016 0629   BASOSABS 0.0 12/30/2016 0629   HEPATIC Function Panel  Recent Labs  04/06/16 1432 12/29/16 1254  PROT 7.9 7.8   HEMOGLOBIN A1C No components found for: HGA1C,  MPG CARDIAC ENZYMES Lab Results  Component Value Date   CKTOTAL 75 06/09/2011   CKMB 2.4 06/09/2011   TROPONINI <0.30 06/09/2011   TROPONINI <0.30 06/08/2011   TROPONINI <0.30 06/08/2011   BNP No results for input(s): PROBNP in the  last 8760 hours. TSH No results for input(s): TSH in the last 8760 hours. CHOLESTEROL No results for input(s): CHOL in the last 8760 hours.  Scheduled Meds: . aspirin EC  81 mg Oral Daily  . atorvastatin  10 mg Oral Daily  . enoxaparin (LOVENOX) injection  40 mg Subcutaneous Q24H  . iopamidol      . isosorbide dinitrate  20 mg Oral TID  . [START ON 01/01/2017] metFORMIN  500 mg Oral BID WC  . metoprolol succinate  25 mg Oral Daily  . pioglitazone  30 mg Oral Daily  . sodium chloride flush  3 mL Intravenous Q12H  . sodium chloride flush  3 mL Intravenous Q12H   Continuous Infusions: . sodium chloride Stopped (12/29/16 1522)  . azithromycin Stopped (12/30/16 1404)   PRN Meds:.sodium chloride,  acetaminophen **OR** acetaminophen, albuterol, benzonatate, cyclobenzaprine, meclizine, nitroGLYCERIN, ondansetron **OR** ondansetron (ZOFRAN) IV, sodium chloride flush  Assessment/Plan: Left lung bronchogenic carcinoma CAD DM, II Obesity HTN CKD, II due to DM and HTN.  CVTS and Oncology consults in AM. May need PET scan and biopsy. Patient agrees to further investigations and treatment.   LOS: 0 days    Dixie Dials  MD  12/30/2016, 10:33 PM

## 2016-12-31 ENCOUNTER — Other Ambulatory Visit: Payer: Self-pay | Admitting: *Deleted

## 2016-12-31 DIAGNOSIS — C3412 Malignant neoplasm of upper lobe, left bronchus or lung: Secondary | ICD-10-CM | POA: Diagnosis not present

## 2016-12-31 DIAGNOSIS — Z720 Tobacco use: Secondary | ICD-10-CM

## 2016-12-31 DIAGNOSIS — R0602 Shortness of breath: Secondary | ICD-10-CM

## 2016-12-31 DIAGNOSIS — R911 Solitary pulmonary nodule: Secondary | ICD-10-CM

## 2016-12-31 DIAGNOSIS — R590 Localized enlarged lymph nodes: Secondary | ICD-10-CM

## 2016-12-31 LAB — GLUCOSE, CAPILLARY
GLUCOSE-CAPILLARY: 89 mg/dL (ref 65–99)
GLUCOSE-CAPILLARY: 135 mg/dL — AB (ref 65–99)

## 2016-12-31 LAB — BASIC METABOLIC PANEL
Anion gap: 9 (ref 5–15)
BUN: 22 mg/dL — AB (ref 6–20)
CHLORIDE: 110 mmol/L (ref 101–111)
CO2: 19 mmol/L — AB (ref 22–32)
CREATININE: 1.07 mg/dL — AB (ref 0.44–1.00)
Calcium: 9 mg/dL (ref 8.9–10.3)
GFR calc Af Amer: 56 mL/min — ABNORMAL LOW (ref >60)
GFR calc non Af Amer: 48 mL/min — ABNORMAL LOW (ref >60)
GLUCOSE: 90 mg/dL (ref 65–99)
POTASSIUM: 3.9 mmol/L (ref 3.5–5.1)
SODIUM: 138 mmol/L (ref 135–145)

## 2016-12-31 NOTE — Consult Note (Addendum)
Noted reports from Cardiothoracic surgery and pulmonary service Will delay medical oncology consult until definitely biopsy done in the outpatient. Patient not seen. Will arrange outpatient consult

## 2016-12-31 NOTE — Discharge Summary (Signed)
Physician Discharge Summary  Patient ID: [name] [name] MRN: [id number] DOB/AGE: [birth year] 82 y.o.  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 She has pulmonary, oncology and CVTS consults and work-up lined up on OP basis. She will see primary doctor in 1 month and see me and pulmonary, oncology and CVTS doctors as arranged.   Consults: cardiology, pulmonary/intensive care, hematology/oncology and CVTS  Significant Diagnostic Studies: labs: Normal CBC except Hgb of 9.9. Near normal BMET except blood sugar of 154 and creatinine of 1.17  Chest x-ray: Developing density in left upper lobe.  CT chest with contrast: Primary bronchogenic carcinoma of left upper lobe + mediastinal lymphadenopathy.   Treatments: Beta blocker discontinued for low heart rate of 40's. Oncology, pulmonary and CVTS follow -up post evaluation by PET scan, PFT etc.   Discharge Exam: Blood pressure (!) 161/55, pulse 60, temperature 98.1 F (36.7 C), temperature source Oral, resp. rate 19, height 5\' 9"  (1.753 m), weight 88.2 kg (194 lb 6.4 oz), SpO2 98 %. General appearance: alert, cooperative and appears stated age. Head: Normocephalic, atraumatic. Eyes: Brown eyes, pink conjunctiva, corneas clear. PERRL, EOM's intact.  Neck: No adenopathy, no carotid bruit, no JVD, supple, symmetrical, trachea midline and thyroid not enlarged. Resp: Clearing to auscultation bilaterally. Cardio: Regular rate and rhythm, S1,  S2 normal, II/VI systolic murmur, no click, rub or gallop. GI: Soft, non-tender; bowel sounds normal; no organomegaly. Extremities: No edema, cyanosis or clubbing. Skin: Warm and dry.  Neurologic: Alert and oriented X 3, normal strength and tone. Normal coordination and slow gait.  Disposition: 01-Home or Self Care   Allergies as of 12/31/2016      Reactions   Codeine Rash   Penicillins Rash   Has patient had a PCN reaction causing immediate rash, facial/tongue/throat swelling, SOB or lightheadedness with hypotension: {Yes Has patient had a PCN reaction causing severe rash involving mucus membranes or skin necrosis: NO Has patient had a PCN reaction that required hospitalization NO Has patient had a PCN reaction occurring within the last 10 years: NO If all of the above answers are "NO", then may proceed with Cephalosporin use.      Medication List    STOP taking these medications   metFORMIN 500 MG tablet Commonly known as:  GLUCOPHAGE   metoprolol succinate 25 MG 24 hr tablet Commonly known as:  TOPROL-XL     TAKE these medications   albuterol 108 (90 Base) MCG/ACT inhaler Commonly known as:  PROVENTIL HFA;VENTOLIN HFA Inhale 2 puffs into the lungs 4 (four) times daily.   amLODipine 10 MG tablet Commonly known as:  NORVASC Take 10 mg by mouth daily.   aspirin EC 81 MG tablet Take 81 mg by mouth daily.   atorvastatin 10 MG tablet Commonly known as:  LIPITOR Take 10 mg by mouth daily.   benzonatate 100 MG capsule Commonly known as:  TESSALON Take 100-200 mg by mouth 3 (three) times daily as needed for cough.   hydrALAZINE 25 MG tablet Commonly known as:  APRESOLINE Take 25 mg by mouth daily.   isosorbide dinitrate 20 MG tablet Commonly known as:  ISORDIL Take 20 mg by mouth 3 (three) times daily.   nitroGLYCERIN 0.4 MG SL tablet Commonly known as:  NITROSTAT TK 1 T UNDER THE TONGUE Q 5 MINUTES X 3 DOSES PRF CHEST PAIN   pioglitazone 30 MG tablet Commonly  known as:  ACTOS Take 30 mg by mouth daily.   valsartan 160 MG tablet Commonly known as:  DIOVAN Take 160 mg by mouth daily.      Follow-up Information    Willey Blade, MD. Schedule an appointment as soon as possible for a visit in 1 month(s).   Specialty:  Internal Medicine Contact information: [address] [place] [address] [phone]        Dixie Dials, MD. Schedule an appointment as soon as possible for a visit in 1 week(s).   Specialty:  Cardiology Contact information: [place] [address] [phone]           Signed: Birdie Riddle 12/31/2016, 2:47 PM

## 2016-12-31 NOTE — Consult Note (Signed)
Name: [name] [name] MRN: [id number] DOB: [birth year]    ADMISSION DATE:  12/29/2016 CONSULTATION DATE:  12/31/2016  REFERRING MD :  Dr. Doylene Canard   CHIEF COMPLAINT:  Dyspnea   HISTORY OF PRESENT ILLNESS:   82 year old female tobacco user with PMH of CAD, DM, Dyspnea, HTN, and CVA  Presents to hospital on 5/30. Went to PCP with complaints of one week of dyspnea, CXR revealed devoloping density in the left upper lobe concerning for malignancy. CT Chest revealed spiculated left upper lobe pulmonary nodule. PCCM asked to consult. Patient denies dyspnea or recent weight loss. Denies fever, chills. Reports long history of tobacco use.   SIGNIFICANT EVENTS  5/30 > Reports to Hospital   STUDIES:  CT Chest 5/31 > Spiculated left upper lobe pulmonary nodule, consistent with primary bronchogenic carcinoma. Borderline to mild contralateral mediastinal adenopathy could be reactive or represent nodal metastasis, centrilobular and paraseptal emphysema, 6 mm right upper lobe pulmonary nodule. Pulmonary artery enlargement suggests pulmonary arterial hypertension.    PAST MEDICAL HISTORY :   has a past medical history of Arthritis; Coronary artery disease; Diabetes mellitus; Dyspnea; Hypertension; and Stroke [facility]).  has a past surgical history that includes Total knee arthroplasty; Ankle surgery; and Abdominal hysterectomy. Prior to Admission medications   Medication Sig Start Date End Date Taking? Authorizing Provider  albuterol (PROVENTIL HFA;VENTOLIN HFA) 108 (90 BASE) MCG/ACT inhaler Inhale 2 puffs into the lungs 4 (four) times daily. 07/28/13  Yes Harden Mo, MD  amLODipine (NORVASC) 10 MG tablet Take 10 mg by mouth daily.     Yes [provider]  aspirin EC 81 MG tablet Take 81 mg by mouth daily.     Yes [provider]  atorvastatin (LIPITOR) 10 MG tablet Take 10 mg by mouth daily. 10/04/15  Yes [provider]  benzonatate (TESSALON) 100 MG capsule Take 100-200 mg by  mouth 3 (three) times daily as needed for cough.   Yes [provider]  hydrALAZINE (APRESOLINE) 25 MG tablet Take 25 mg by mouth daily.   Yes [provider]  isosorbide dinitrate (ISORDIL) 20 MG tablet Take 20 mg by mouth 3 (three) times daily.   Yes [provider]  metFORMIN (GLUCOPHAGE) 500 MG tablet Take 500 mg by mouth 2 (two) times daily with a meal.     Yes [provider]  metoprolol succinate (TOPROL-XL) 25 MG 24 hr tablet Take 25 mg by mouth daily.     Yes [provider]  nitroGLYCERIN (NITROSTAT) 0.4 MG SL tablet TK 1 T UNDER THE TONGUE Q 5 MINUTES X 3 DOSES PRF CHEST PAIN 07/29/15  Yes [provider]  pioglitazone (ACTOS) 30 MG tablet Take 30 mg by mouth daily.   Yes [provider]  valsartan (DIOVAN) 160 MG tablet Take 160 mg by mouth daily.   Yes [provider]   Allergies  Allergen Reactions  . Codeine Rash  . Penicillins Rash    Has patient had a PCN reaction causing immediate rash, facial/tongue/throat swelling, SOB or lightheadedness with hypotension: {Yes Has patient had a PCN reaction causing severe rash involving mucus membranes or skin necrosis: NO Has patient had a PCN reaction that required hospitalization NO Has patient had a PCN reaction occurring within the last 10 years: NO If all of the above answers are "NO", then may proceed with Cephalosporin use.    FAMILY HISTORY:  family history includes Diabetes in her brother, mother, sister, and son; Hyperlipidemia in  her sister. SOCIAL HISTORY:  reports that she has been smoking Cigarettes.  She has a 6.00 pack-year smoking history. She has never used smokeless tobacco. She reports that she does not drink alcohol or use drugs.  REVIEW OF SYSTEMS:   All negative; except for those that are bolded, which indicate positives.  Constitutional: weight loss, weight gain, night sweats, fevers, chills, fatigue, weakness.  HEENT: headaches, sore  throat, sneezing, nasal congestion, post nasal drip, difficulty swallowing, tooth/dental problems, visual complaints, visual changes, ear aches. Neuro: difficulty with speech, weakness, numbness, ataxia. CV:  chest pain, orthopnea, PND, swelling in lower extremities, dizziness, palpitations, syncope.  Resp: cough, hemoptysis, dyspnea, wheezing. GI: heartburn, indigestion, abdominal pain, nausea, vomiting, diarrhea, constipation, change in bowel habits, loss of appetite, hematemesis, melena, hematochezia.  GU: dysuria, change in color of urine, urgency or frequency, flank pain, hematuria. MSK: joint pain or swelling, decreased range of motion. Psych: change in mood or affect, depression, anxiety, suicidal ideations, homicidal ideations. Skin: rash, itching, bruising.  SUBJECTIVE:  Denies dyspnea or pain. Sitting on edge of bed eating lunch. No distress   VITAL SIGNS: Temp:  [97.8 F (36.6 C)-98.1 F (36.7 C)] 98.1 F (36.7 C) (06/01 0413) Pulse Rate:  [54-60] 60 (06/01 0413) Resp:  [19-21] 19 (06/01 0413) BP: (126-161)/(51-57) 161/55 (06/01 0413) SpO2:  [96 %-98 %] 98 % (06/01 0413)  PHYSICAL EXAMINATION: General:  Awake, alert, no distress Neuro:  Nonfocal, perrl HEENT:  lymphad none neck Cardiovascular:  s1 s2 RRR Lungs:  CTA Abdomen:  Soft, bs wnl, no r Musculoskeletal:  No edema Skin:  No rash   Recent Labs Lab 12/29/16 1254 12/30/16 0352 12/31/16 0552  NA 139 138 138  K 4.0 5.1 3.9  CL 109 111 110  CO2 20* 17* 19*  BUN 17 23* 22*  CREATININE 1.17* 1.14* 1.07*  GLUCOSE 154* 82 90    Recent Labs Lab 12/29/16 1254 12/30/16 0629  HGB 11.0* 9.9*  HCT 34.8* 31.3*  WBC 5.7 5.1  PLT 259 238   Ct Chest W Contrast  Result Date: 12/30/2016 CLINICAL DATA:  Chest soreness. Possible left upper lobe pulmonary nodule on chest radiograph. Diabetes. Hypertension. Current smoker. EXAM: CT CHEST WITH CONTRAST TECHNIQUE: Multidetector CT imaging of the chest was performed  during intravenous contrast administration. CONTRAST:  <> ISOVUE-300 IOPAMIDOL (ISOVUE-300) INJECTION 61% COMPARISON:  Chest radiograph 12/23/2016.  No prior CT. FINDINGS: Cardiovascular: Aortic and branch vessel atherosclerosis. Tortuous thoracic aorta. Mild ulcerative plaque with transverse and descending aorta. Mild cardiomegaly with multivessel coronary artery atherosclerosis. Pulmonary artery enlargement, 3.7 cm outflow tract. No central pulmonary embolism, on this non-dedicated study. Mediastinum/Nodes: Borderline right paratracheal adenopathy at 10 mm on image 42/series 3. A precarinal node measures 2.9 x 1.3 cm on image 51/series 3. No hilar adenopathy. Lungs/Pleura: Moderate centrilobular and mild paraseptal emphysema. Minimal motion degradation in the upper and mid chest. pleural-based right upper lobe pulmonary nodule of 6 mm on image 50/ series 7. Corresponding to the plain film abnormality, within the peripheral, subpleural left upper lobe, is a 1.8 x 1.7 cm spiculated nodule including on image 42/series 7. Upper Abdomen: Normal imaged portions of the liver, spleen, pancreas, adrenal glands. Scarred kidneys. Proximal gastric underdistention. Musculoskeletal: Moderate thoracic spondylosis. IMPRESSION: 1. Spiculated left upper lobe pulmonary nodule, consistent with primary bronchogenic carcinoma. 2. Borderline to mild contralateral mediastinal adenopathy could be reactive or represent nodal metastasis. Consider further characterization with PET. 3. Centrilobular and paraseptal emphysema. 4. 6 mm right upper lobe pulmonary nodule,  nonspecific. 5. Pulmonary artery enlargement suggests pulmonary arterial hypertension. 6. Cardiomegaly. Coronary artery atherosclerosis. Aortic atherosclerosis. Electronically Signed   By: Abigail Miyamoto M.D.   On: 12/30/2016 14:24    ASSESSMENT / PLAN:  Spiculated left upper lobe pulmonary nodule, infectious vs malignancy Plan  -does not appear to be infectious process,  afebrile, WBC 5.1, in setting of long standing tobacco use and chronic lung changes  Plan  -Patient okay to be discharged and follow up  -Will Need PET scan outpatient > at this time will hold off on biopsy, will obtain PET scan results and then follow up with surgery for plan    Hayden Pedro, AGACNP-BC [place]  Pgr: [phone]  PCCM Pgr: [phone]    STAFF NOTE: Linwood Dibbles, MD FACP have personally reviewed patient's available data, including medical history, events of note, physical examination and test results as part of my evaluation. I have discussed with resident/NP and other care providers such as pharmacist, RN and RRT. In addition, I personally evaluated patient and elicited key findings of: called [facility] / dr Doylene Canard pt -> awake, alert, no distress, CTA, no lymphad axilla or neck, no h/o wt loss, no hemoptysis, abdo soft, no mass felt, no edema, I reviewed pcxr and CT  Myself which revealed spiculated mass LUL, some lymphad bilateral, no PNA / infiltrates, she is asking to go home, she is on RA, her lungs at baseline appear with chronic changes related to smoking, given lymphad, would need PET scan first, then would have her see CVTS as outpt, it appears to be most amendable to CT guidance, she will need outpt PFT, her functional status seems good, no role ABX  Lavon Paganini. Titus Mould, MD, [place] Pgr: [facility] 12/31/2016 1:20 PM

## 2016-12-31 NOTE — Progress Notes (Signed)
Patient received all discharge information and education. Patient verbalized understanding.

## 2016-12-31 NOTE — Consult Note (Signed)
Reason for Consult: Lung nodule Referring Physician: [name] is an 82 y.o. female.  HPI: 82 yo woman with a history of tobacco abuse (1/2 ppd x 50 years), CAD, diabetes, hypertension, arthritis and a previous stroke. She has been having right sided anterior chest wall pain for about the past 2 weeks. She has also had a productive cough. She had a CXR which suggested a possible pneumonia and was admitted and started on empiric antibiotics. A CT showed a 1.7 cm LUL spiculated nodule with possible mediastinal adenopathy.   She denies change in appetite or weight loss. + cough, no wheezing or hemoptysis. Denies HA or recent visual changes.  Lives at home with son. Still smokes cigarettes "occasionally."  Zubrod Score: At the time of surgery this patient's most appropriate activity status/level should be described as: [x]     0    Normal activity, no symptoms []     1    Restricted in physical strenuous activity but ambulatory, able to do out light work []     2    Ambulatory and capable of self care, unable to do work activities, up and about >50 % of waking hours                              []     3    Only limited self care, in bed greater than 50% of waking hours []     4    Completely disabled, no self care, confined to bed or chair []     5    Moribund   Past Medical History:  Diagnosis Date  . Arthritis   . Coronary artery disease   . Diabetes mellitus   . Dyspnea   . Hypertension   . Stroke [facility])     Past Surgical History:  Procedure Laterality Date  . ABDOMINAL HYSTERECTOMY    . ANKLE SURGERY    . TOTAL KNEE ARTHROPLASTY      Family History  Problem Relation Age of Onset  . Diabetes Mother   . Diabetes Sister   . Hyperlipidemia Sister   . Diabetes Brother   . Diabetes Son     Social History:  reports that she has been smoking Cigarettes.  She has a 6.00 pack-year smoking history. She has never used smokeless tobacco. She reports that she does not drink  alcohol or use drugs.  Allergies:  Allergies  Allergen Reactions  . Codeine Rash  . Penicillins Rash    Has patient had a PCN reaction causing immediate rash, facial/tongue/throat swelling, SOB or lightheadedness with hypotension: {Yes Has patient had a PCN reaction causing severe rash involving mucus membranes or skin necrosis: NO Has patient had a PCN reaction that required hospitalization NO Has patient had a PCN reaction occurring within the last 10 years: NO If all of the above answers are "NO", then may proceed with Cephalosporin use.    Medications:  Scheduled: . aspirin EC  81 mg Oral Daily  . atorvastatin  10 mg Oral Daily  . enoxaparin (LOVENOX) injection  40 mg Subcutaneous Q24H  . isosorbide dinitrate  20 mg Oral TID  . [START ON 01/01/2017] metFORMIN  500 mg Oral BID WC  . pioglitazone  30 mg Oral Daily  . sodium chloride flush  3 mL Intravenous Q12H  . sodium chloride flush  3 mL Intravenous Q12H    Results for orders placed or performed during  the hospital encounter of 12/29/16 (from the past 48 hour(s))  Comprehensive metabolic panel     Status: Abnormal   Collection Time: 12/29/16 12:54 PM  Result Value Ref Range   Sodium 139 135 - 145 mmol/L   Potassium 4.0 3.5 - 5.1 mmol/L   Chloride 109 101 - 111 mmol/L   CO2 20 (L) 22 - 32 mmol/L   Glucose, Bld 154 (H) 65 - 99 mg/dL   BUN 17 6 - 20 mg/dL   Creatinine, Ser 1.17 (H) 0.44 - 1.00 mg/dL   Calcium 9.5 8.9 - 10.3 mg/dL   Total Protein 7.8 6.5 - 8.1 g/dL   Albumin 3.9 3.5 - 5.0 g/dL   AST 17 15 - 41 U/L   ALT 10 (L) 14 - 54 U/L   Alkaline Phosphatase 45 38 - 126 U/L   Total Bilirubin 0.4 0.3 - 1.2 mg/dL   GFR calc non Af Amer 43 (L) >60 mL/min   GFR calc Af Amer 50 (L) >60 mL/min    Comment: (NOTE) The eGFR has been calculated using the CKD EPI equation. This calculation has not been validated in all clinical situations. eGFR's persistently <60 mL/min signify possible Chronic Kidney Disease.    Anion gap  10 5 - 15  CBC WITH DIFFERENTIAL     Status: Abnormal   Collection Time: 12/29/16 12:54 PM  Result Value Ref Range   WBC 5.7 4.0 - 10.5 K/uL   RBC 4.31 3.87 - 5.11 MIL/uL   Hemoglobin 11.0 (L) 12.0 - 15.0 g/dL   HCT 34.8 (L) 36.0 - 46.0 %   MCV 80.7 78.0 - 100.0 fL   MCH 25.5 (L) 26.0 - 34.0 pg   MCHC 31.6 30.0 - 36.0 g/dL   RDW 15.5 11.5 - 15.5 %   Platelets 259 150 - 400 K/uL   Neutrophils Relative % 56 %   Neutro Abs 3.2 1.7 - 7.7 K/uL   Lymphocytes Relative 34 %   Lymphs Abs 1.9 0.7 - 4.0 K/uL   Monocytes Relative 6 %   Monocytes Absolute 0.4 0.1 - 1.0 K/uL   Eosinophils Relative 4 %   Eosinophils Absolute 0.2 0.0 - 0.7 K/uL   Basophils Relative 0 %   Basophils Absolute 0.0 0.0 - 0.1 K/uL  Glucose, capillary     Status: Abnormal   Collection Time: 12/29/16  1:26 PM  Result Value Ref Range   Glucose-Capillary 138 (H) 65 - 99 mg/dL  Glucose, capillary     Status: None   Collection Time: 12/29/16  4:42 PM  Result Value Ref Range   Glucose-Capillary 92 65 - 99 mg/dL  Glucose, capillary     Status: None   Collection Time: 12/29/16  9:49 PM  Result Value Ref Range   Glucose-Capillary 94 65 - 99 mg/dL  Basic metabolic panel     Status: Abnormal   Collection Time: 12/30/16  3:52 AM  Result Value Ref Range   Sodium 138 135 - 145 mmol/L   Potassium 5.1 3.5 - 5.1 mmol/L    Comment: HEMOLYSIS AT THIS LEVEL MAY AFFECT RESULT   Chloride 111 101 - 111 mmol/L   CO2 17 (L) 22 - 32 mmol/L   Glucose, Bld 82 65 - 99 mg/dL   BUN 23 (H) 6 - 20 mg/dL   Creatinine, Ser 1.14 (H) 0.44 - 1.00 mg/dL   Calcium 8.6 (L) 8.9 - 10.3 mg/dL   GFR calc non Af Amer 45 (L) >60 mL/min   GFR  calc Af Amer 52 (L) >60 mL/min    Comment: (NOTE) The eGFR has been calculated using the CKD EPI equation. This calculation has not been validated in all clinical situations. eGFR's persistently <60 mL/min signify possible Chronic Kidney Disease.    Anion gap 10 5 - 15  CBC with Differential/Platelet      Status: Abnormal   Collection Time: 12/30/16  6:29 AM  Result Value Ref Range   WBC 5.1 4.0 - 10.5 K/uL   RBC 3.90 3.87 - 5.11 MIL/uL   Hemoglobin 9.9 (L) 12.0 - 15.0 g/dL   HCT 31.3 (L) 36.0 - 46.0 %   MCV 80.3 78.0 - 100.0 fL   MCH 25.4 (L) 26.0 - 34.0 pg   MCHC 31.6 30.0 - 36.0 g/dL   RDW 15.5 11.5 - 15.5 %   Platelets 238 150 - 400 K/uL   Neutrophils Relative % 42 %   Neutro Abs 2.1 1.7 - 7.7 K/uL   Lymphocytes Relative 46 %   Lymphs Abs 2.4 0.7 - 4.0 K/uL   Monocytes Relative 6 %   Monocytes Absolute 0.3 0.1 - 1.0 K/uL   Eosinophils Relative 6 %   Eosinophils Absolute 0.3 0.0 - 0.7 K/uL   Basophils Relative 0 %   Basophils Absolute 0.0 0.0 - 0.1 K/uL  Glucose, capillary     Status: Abnormal   Collection Time: 12/30/16  7:23 AM  Result Value Ref Range   Glucose-Capillary 100 (H) 65 - 99 mg/dL  Glucose, capillary     Status: None   Collection Time: 12/30/16 11:31 AM  Result Value Ref Range   Glucose-Capillary 94 65 - 99 mg/dL  Glucose, capillary     Status: None   Collection Time: 12/30/16  4:02 PM  Result Value Ref Range   Glucose-Capillary 89 65 - 99 mg/dL  Iron and TIBC     Status: None   Collection Time: 12/30/16  7:19 PM  Result Value Ref Range   Iron 46 28 - 170 ug/dL   TIBC 295 250 - 450 ug/dL   Saturation Ratios 16 10.4 - 31.8 %   UIBC 249 ug/dL  Ferritin     Status: None   Collection Time: 12/30/16  7:19 PM  Result Value Ref Range   Ferritin 53 11 - 307 ng/mL  Glucose, capillary     Status: Abnormal   Collection Time: 12/30/16  9:31 PM  Result Value Ref Range   Glucose-Capillary 100 (H) 65 - 99 mg/dL  Basic metabolic panel     Status: Abnormal   Collection Time: 12/31/16  5:52 AM  Result Value Ref Range   Sodium 138 135 - 145 mmol/L   Potassium 3.9 3.5 - 5.1 mmol/L   Chloride 110 101 - 111 mmol/L   CO2 19 (L) 22 - 32 mmol/L   Glucose, Bld 90 65 - 99 mg/dL   BUN 22 (H) 6 - 20 mg/dL   Creatinine, Ser 1.07 (H) 0.44 - 1.00 mg/dL   Calcium 9.0 8.9 -  10.3 mg/dL   GFR calc non Af Amer 48 (L) >60 mL/min   GFR calc Af Amer 56 (L) >60 mL/min    Comment: (NOTE) The eGFR has been calculated using the CKD EPI equation. This calculation has not been validated in all clinical situations. eGFR's persistently <60 mL/min signify possible Chronic Kidney Disease.    Anion gap 9 5 - 15  Glucose, capillary     Status: None   Collection Time: 12/31/16  7:28 AM  Result Value Ref Range   Glucose-Capillary 89 65 - 99 mg/dL  Glucose, capillary     Status: Abnormal   Collection Time: 12/31/16 11:08 AM  Result Value Ref Range   Glucose-Capillary 135 (H) 65 - 99 mg/dL    Ct Chest W Contrast  Result Date: 12/30/2016 CLINICAL DATA:  Chest soreness. Possible left upper lobe pulmonary nodule on chest radiograph. Diabetes. Hypertension. Current smoker. EXAM: CT CHEST WITH CONTRAST TECHNIQUE: Multidetector CT imaging of the chest was performed during intravenous contrast administration. CONTRAST:  <> ISOVUE-300 IOPAMIDOL (ISOVUE-300) INJECTION 61% COMPARISON:  Chest radiograph 12/23/2016.  No prior CT. FINDINGS: Cardiovascular: Aortic and branch vessel atherosclerosis. Tortuous thoracic aorta. Mild ulcerative plaque with transverse and descending aorta. Mild cardiomegaly with multivessel coronary artery atherosclerosis. Pulmonary artery enlargement, 3.7 cm outflow tract. No central pulmonary embolism, on this non-dedicated study. Mediastinum/Nodes: Borderline right paratracheal adenopathy at 10 mm on image 42/series 3. A precarinal node measures 2.9 x 1.3 cm on image 51/series 3. No hilar adenopathy. Lungs/Pleura: Moderate centrilobular and mild paraseptal emphysema. Minimal motion degradation in the upper and mid chest. pleural-based right upper lobe pulmonary nodule of 6 mm on image 50/ series 7. Corresponding to the plain film abnormality, within the peripheral, subpleural left upper lobe, is a 1.8 x 1.7 cm spiculated nodule including on image 42/series 7. Upper  Abdomen: Normal imaged portions of the liver, spleen, pancreas, adrenal glands. Scarred kidneys. Proximal gastric underdistention. Musculoskeletal: Moderate thoracic spondylosis. IMPRESSION: 1. Spiculated left upper lobe pulmonary nodule, consistent with primary bronchogenic carcinoma. 2. Borderline to mild contralateral mediastinal adenopathy could be reactive or represent nodal metastasis. Consider further characterization with PET. 3. Centrilobular and paraseptal emphysema. 4. 6 mm right upper lobe pulmonary nodule, nonspecific. 5. Pulmonary artery enlargement suggests pulmonary arterial hypertension. 6. Cardiomegaly. Coronary artery atherosclerosis. Aortic atherosclerosis. Electronically Signed   By: Abigail Miyamoto M.D.   On: 12/30/2016 14:24    Review of Systems  Constitutional: Negative for chills, fever and weight loss.  Eyes: Negative for blurred vision and double vision.  Respiratory: Positive for cough and sputum production. Negative for hemoptysis, shortness of breath and wheezing.   Cardiovascular: Positive for chest pain (chest wall).  Gastrointestinal: Negative for nausea and vomiting.  Genitourinary: Negative for dysuria and hematuria.  Musculoskeletal: Positive for joint pain.  Neurological: Negative for dizziness, focal weakness, seizures and headaches.  Endo/Heme/Allergies: Does not bruise/bleed easily.  All other systems reviewed and are negative.  Blood pressure (!) 161/55, pulse 60, temperature 98.1 F (36.7 C), temperature source Oral, resp. rate 19, height 5' 9"  (1.753 m), weight 194 lb 6.4 oz (88.2 kg), SpO2 98 %. Physical Exam  Vitals reviewed. Constitutional: She is oriented to person, place, and time. She appears well-developed and well-nourished. No distress.  HENT:  Head: Normocephalic and atraumatic.  Mouth/Throat: No oropharyngeal exudate.  Eyes: Conjunctivae and EOM are normal. No scleral icterus.  Neck: No thyromegaly present.  Cardiovascular: Normal rate,  regular rhythm and normal heart sounds.  Exam reveals no gallop and no friction rub.   No murmur heard. Respiratory: Effort normal and breath sounds normal. No respiratory distress. She has no wheezes. She has no rales.  GI: Soft. She exhibits no distension. There is no tenderness.  Musculoskeletal: She exhibits no edema.  Lymphadenopathy:    She has no cervical adenopathy.  Neurological: She is alert and oriented to person, place, and time. No cranial nerve deficit.  Skin: Skin is warm and dry.    Assessment/Plan:  82 yo woman with a history of ongoing tobacco abuse who has been found to have a left lung nodule with possible mediastinal adenopathy. This is most likely a primary bronchogenic carcinoma, possibly stage IIIB by CT. Infectious or inflammatory etiologies are also possible but far less likely.   She will need a PET/CT as an outpatient which will help with determining the best approach for a biopsy.  Potential biopsy options include bronch/ EBUS v CT guided. PET/CT result will help determine the optimal approach.  Will arrange PET/ CT- hopefully can be done next week. I will see her back after that.  Melrose Nakayama 12/31/2016, 12:20 PM

## 2017-01-01 LAB — HEMOGLOBIN A1C
Hgb A1c MFr Bld: 6.1 % — ABNORMAL HIGH (ref 4.8–5.6)
Mean Plasma Glucose: 128 mg/dL

## 2017-01-04 ENCOUNTER — Encounter: Payer: Medicare Other | Admitting: Thoracic Surgery (Cardiothoracic Vascular Surgery)

## 2017-01-12 ENCOUNTER — Ambulatory Visit (HOSPITAL_COMMUNITY)
Admission: RE | Admit: 2017-01-12 | Discharge: 2017-01-12 | Disposition: A | Discharge status: Home / Self Care | Payer: Medicare Other | Source: Ambulatory Visit | Attending: Thoracic Surgery (Cardiothoracic Vascular Surgery) | Admitting: Thoracic Surgery (Cardiothoracic Vascular Surgery)

## 2017-01-12 DIAGNOSIS — I700 Atherosclerosis of aorta: Secondary | ICD-10-CM | POA: Diagnosis not present

## 2017-01-12 DIAGNOSIS — R590 Localized enlarged lymph nodes: Secondary | ICD-10-CM

## 2017-01-12 DIAGNOSIS — Z720 Tobacco use: Secondary | ICD-10-CM | POA: Diagnosis present

## 2017-01-12 DIAGNOSIS — R911 Solitary pulmonary nodule: Secondary | ICD-10-CM

## 2017-01-12 DIAGNOSIS — J439 Emphysema, unspecified: Secondary | ICD-10-CM | POA: Insufficient documentation

## 2017-01-12 LAB — GLUCOSE, CAPILLARY: GLUCOSE-CAPILLARY: 127 mg/dL — AB (ref 65–99)

## 2017-01-12 MED ORDER — FLUDEOXYGLUCOSE F - 18 (FDG) INJECTION
9.7800 | Freq: Once | INTRAVENOUS | Status: AC | PRN
  Administered 2017-01-12: 9.78 via INTRAVENOUS

## 2017-01-13 ENCOUNTER — Ambulatory Visit (INDEPENDENT_AMBULATORY_CARE_PROVIDER_SITE_OTHER): Payer: Medicare Other | Admitting: Thoracic Surgery (Cardiothoracic Vascular Surgery)

## 2017-01-13 ENCOUNTER — Other Ambulatory Visit: Payer: Self-pay

## 2017-01-13 ENCOUNTER — Encounter: Payer: Self-pay | Admitting: Thoracic Surgery (Cardiothoracic Vascular Surgery)

## 2017-01-13 VITALS — BP 155/74 | HR 73 | Resp 16 | Ht 69.0 in | Wt 194.0 lb

## 2017-01-13 DIAGNOSIS — D381 Neoplasm of uncertain behavior of trachea, bronchus and lung: Secondary | ICD-10-CM | POA: Diagnosis not present

## 2017-01-13 DIAGNOSIS — R590 Localized enlarged lymph nodes: Secondary | ICD-10-CM

## 2017-01-13 DIAGNOSIS — R918 Other nonspecific abnormal finding of lung field: Secondary | ICD-10-CM

## 2017-01-13 NOTE — Progress Notes (Signed)
[address]       [place], [address]             [phone]    HPI: [name] [name] returns today to discuss the results for PET/CT and further discuss management of her left upper lobe nodule.  [name] [name] is a 82 year old woman with a history of tobacco abuse who presented with a two-week history of right-sided anterior chest wall pain. A chest x-ray suggested a possible pneumonia and she was admitted and started on empiric antibiotics. She denied any fevers or chills or productive cough leading up to the admission. A CT scan in the hospital showed a 1.7 cm spiculated nodule in the left upper lobe. There was a borderline enlarged paratracheal lymph node.  She continues to smoke about one half a pack of cigarettes daily. Her right anterior chest wall pain has almost completely resolved. She denies any shortness of breath.  Past Medical History:  Diagnosis Date  . Arthritis   . Coronary artery disease   . Diabetes mellitus   . Dyspnea   . Hypertension   . Stroke [facility])    Past Surgical History:  Procedure Laterality Date  . ABDOMINAL HYSTERECTOMY    . ANKLE SURGERY    . TOTAL KNEE ARTHROPLASTY      Current Outpatient Prescriptions  Medication Sig Dispense Refill  . albuterol (PROVENTIL HFA;VENTOLIN HFA) 108 (90 BASE) MCG/ACT inhaler Inhale 2 puffs into the lungs 4 (four) times daily. 1 Inhaler 0  . amLODipine (NORVASC) 10 MG tablet Take 10 mg by mouth daily.      Marland Kitchen aspirin EC 81 MG tablet Take 81 mg by mouth daily.      Marland Kitchen atorvastatin (LIPITOR) 10 MG tablet Take 10 mg by mouth daily.  5  . benzonatate (TESSALON) 100 MG capsule Take 100-200 mg by mouth 3 (three) times daily as needed for cough.    . hydrALAZINE (APRESOLINE) 25 MG tablet Take 25 mg by mouth daily.    . isosorbide dinitrate (ISORDIL) 20 MG tablet Take 20 mg by mouth 3 (three) times daily.    . meclizine (ANTIVERT) 25 MG tablet Take 25 mg by mouth 2 (two) times daily as needed for dizziness.      . metFORMIN (GLUCOPHAGE) 500 MG tablet Take 1,000 mg by mouth 2 (two) times daily with a meal.    . metoprolol succinate (TOPROL-XL) 25 MG 24 hr tablet Take 25 mg by mouth daily.    . metoprolol tartrate (LOPRESSOR) 25 MG tablet Take 25 mg by mouth 2 (two) times daily.    . nitroGLYCERIN (NITROSTAT) 0.4 MG SL tablet TK 1 T UNDER THE TONGUE Q 5 MINUTES X 3 DOSES PRF CHEST PAIN  0  . pioglitazone (ACTOS) 30 MG tablet Take 30 mg by mouth daily.    . valsartan (DIOVAN) 160 MG tablet Take 160 mg by mouth daily.     No current facility-administered medications for this visit.     Physical Exam BP (!) 155/74 (BP Location: Right Arm, Patient Position: Sitting, Cuff Size: Large)   Pulse 73   Resp 16   Ht 5\' 9"  (1.753 m)   Wt 194 lb (88 kg)   SpO2 96% Comment: ON RA  BMI 28.10 kg/m  82 year old woman in no acute distress Alert and oriented 3 with no focal neurologic deficits Cardiac regular rate and rhythm normal S1 and S2 Lungs clear equal breath sounds bilaterally  Diagnostic Tests: NUCLEAR MEDICINE  PET SKULL BASE TO THIGH  TECHNIQUE: 9.78 mCi F-18 FDG was injected intravenously. Full-ring PET imaging was performed from the skull base to thigh after the radiotracer. CT data was obtained and used for attenuation correction and anatomic localization.  FASTING BLOOD GLUCOSE:  Value: 127 mg/dl  COMPARISON:  Chest CT 12/30/2016  FINDINGS: NECK  No hypermetabolic cervical lymph nodes are identified.There are no lesions of the pharyngeal mucosal space. There is physiologic activity associated with the muscles of phonation. Carotid atherosclerosis noted bilaterally.  CHEST  There are no hypermetabolic mediastinal, hilar or axillary lymph nodes. Specifically, the previously demonstrated right paratracheal and precarinal lymph nodes demonstrate activity similar to blood pool. The spiculated left upper lobe nodule is hypermetabolic. This nodule measures 18 x 18 mm on image  17 and has an SUV max of 15.1. No other hypermetabolic pulmonary activity. There is no abnormal activity within the small subpleural right upper lobe nodule (image 25), suboptimally evaluated based on size. Underlying centrilobular and paraseptal emphysema, cardiomegaly and atherosclerosis again noted.  ABDOMEN/PELVIS  There is no hypermetabolic activity within the liver, adrenal glands, spleen or pancreas. There is no hypermetabolic nodal activity. Diffuse aortic and branch vessel atherosclerosis noted. Previous cholecystectomy and hysterectomy with probable pelvic floor laxity.  SKELETON  There is no hypermetabolic activity to suggest osseous metastatic disease. Mild soft tissue activity lateral to the left femoral neck, likely inflammatory.  IMPRESSION: 1. Solitary, spiculated hypermetabolic left upper lobe nodule without evidence metastatic disease (most consistent with stage IA disease -[id number]). 2. No other suspicious metabolic activity. 3. Aortic Atherosclerosis (ICD10-I70.0) and Emphysema (ICD10-J43.9).   Electronically Signed   By: Richardean Sale M.D.   On: 01/12/2017 11:39 I personally reviewed the PET CT images and concur with the findings as noted above. I am still concerned about the 4R paratracheal lymph node even though it was not definitively hypermetabolic on PET/CT.  Impression: [name] [name] is a 82 year old woman with a history of tobacco abuse who has a 1.8 cm hypermetabolic nodule in the left upper lobe. This almost certainly is a new primary bronchogenic carcinoma and has to be considered that unless it can be proven otherwise. I recommended that we proceed with a navigational bronchoscopy and endobronchial ultrasound to attempt to biopsy the lesion and to stage the mediastinal lymph nodes.   She was accompanied by her daughters for her visit today. I informed them of the reasons for my recommendation to stage the mediastinum prior to definitive  treatment of the left upper lobe nodule, whether surgery or radiation. I informed him of the general nature of the procedure including the need for general anesthesia. They are aware his endoscopic plan to do it on an outpatient basis. I informed him of the indications, risks, benefits, and alternatives. They understand the risks include, but are not limited to those associated with general anesthesia, bleeding, pneumothorax, and failure to make a diagnosis.  After a long discussion and many questions, she understands the risks and agrees to proceed.  In preparation for making a decision as to how to treat the nodule should the mediastinal nodes be negative, we will also get pulmonary function testing with and without bronchodilators.  Plan: PFTs with and without bronchodilators  Navigational bronchoscopy and endobronchial ultrasound on Thursday, 01/20/2017  Melrose Nakayama, MD [facility] [phone]

## 2017-01-17 ENCOUNTER — Other Ambulatory Visit: Payer: Self-pay | Admitting: *Deleted

## 2017-01-17 DIAGNOSIS — R918 Other nonspecific abnormal finding of lung field: Secondary | ICD-10-CM

## 2017-01-17 DIAGNOSIS — R590 Localized enlarged lymph nodes: Secondary | ICD-10-CM

## 2017-01-18 ENCOUNTER — Ambulatory Visit (HOSPITAL_COMMUNITY)
Admission: RE | Admit: 2017-01-18 | Discharge: 2017-01-18 | Disposition: A | Discharge status: Home / Self Care | Payer: Medicare Other | Source: Ambulatory Visit | Attending: Cardiothoracic Surgery | Admitting: Cardiothoracic Surgery

## 2017-01-18 DIAGNOSIS — R918 Other nonspecific abnormal finding of lung field: Secondary | ICD-10-CM | POA: Diagnosis present

## 2017-01-18 LAB — PULMONARY FUNCTION TEST
FEF 25-75 Post: 1.99 L/sec
FEF 25-75 Pre: 1.14 L/sec
FEF2575-%Change-Post: 73 %
FEF2575-%Pred-Post: 116 %
FEF2575-%Pred-Pre: 67 %
FEV1-%Change-Post: 14 %
FEV1-%Pred-Post: 78 %
FEV1-%Pred-Pre: 68 %
FEV1-Post: 1.61 L
FEV1-Pre: 1.4 L
FEV1FVC-%Change-Post: 6 %
FEV1FVC-%Pred-Pre: 100 %
FEV6-%Change-Post: 1 %
FEV6-%Pred-Post: 74 %
FEV6-%Pred-Pre: 72 %
FEV6-Post: 1.87 L
FEV6-Pre: 1.84 L
FEV6FVC-%Pred-Post: 103 %
FEV6FVC-%Pred-Pre: 103 %
FVC-%Change-Post: 7 %
FVC-%Pred-Post: 76 %
FVC-%Pred-Pre: 71 %
FVC-Post: 2.01 L
FVC-Pre: 1.87 L
Post FEV1/FVC ratio: 80 %
Post FEV6/FVC ratio: 100 %
Pre FEV1/FVC ratio: 75 %
Pre FEV6/FVC Ratio: 100 %
RV % pred: 203 %
RV: 5.35 L
TLC % pred: 116 %
TLC: 6.73 L

## 2017-01-18 MED ORDER — ALBUTEROL SULFATE (2.5 MG/3ML) 0.083% IN NEBU
2.5000 mg | INHALATION_SOLUTION | Freq: Once | RESPIRATORY_TRACT | Status: AC
  Administered 2017-01-18: 2.5 mg via RESPIRATORY_TRACT

## 2017-01-19 ENCOUNTER — Ambulatory Visit (HOSPITAL_COMMUNITY)
Admission: RE | Admit: 2017-01-19 | Discharge: 2017-01-19 | Disposition: A | Discharge status: Home / Self Care | Payer: Medicare Other | Source: Ambulatory Visit | Attending: Thoracic Surgery (Cardiothoracic Vascular Surgery) | Admitting: Thoracic Surgery (Cardiothoracic Vascular Surgery)

## 2017-01-19 ENCOUNTER — Encounter (HOSPITAL_COMMUNITY): Payer: Self-pay

## 2017-01-19 ENCOUNTER — Encounter (HOSPITAL_COMMUNITY)
Admission: RE | Admit: 2017-01-19 | Discharge: 2017-01-19 | Disposition: A | Discharge status: Home / Self Care | Payer: Medicare Other | Source: Ambulatory Visit | Attending: Thoracic Surgery (Cardiothoracic Vascular Surgery) | Admitting: Thoracic Surgery (Cardiothoracic Vascular Surgery)

## 2017-01-19 DIAGNOSIS — R590 Localized enlarged lymph nodes: Secondary | ICD-10-CM | POA: Insufficient documentation

## 2017-01-19 DIAGNOSIS — R918 Other nonspecific abnormal finding of lung field: Secondary | ICD-10-CM | POA: Diagnosis not present

## 2017-01-19 DIAGNOSIS — I700 Atherosclerosis of aorta: Secondary | ICD-10-CM | POA: Insufficient documentation

## 2017-01-19 HISTORY — DX: Palpitations: R00.2

## 2017-01-19 HISTORY — DX: Presence of dental prosthetic device (complete) (partial): Z97.2

## 2017-01-19 HISTORY — DX: Angina pectoris, unspecified: I20.9

## 2017-01-19 HISTORY — DX: Presence of spectacles and contact lenses: Z97.3

## 2017-01-19 HISTORY — DX: Pneumonia, unspecified organism: J18.9

## 2017-01-19 HISTORY — DX: Unspecified cataract: H26.9

## 2017-01-19 HISTORY — DX: Other nonspecific abnormal finding of lung field: R91.8

## 2017-01-19 LAB — COMPREHENSIVE METABOLIC PANEL
ALBUMIN: 3.9 g/dL (ref 3.5–5.0)
ALK PHOS: 52 U/L (ref 38–126)
ALT: 10 U/L — ABNORMAL LOW (ref 14–54)
ANION GAP: 8 (ref 5–15)
AST: 15 U/L (ref 15–41)
BUN: 25 mg/dL — ABNORMAL HIGH (ref 6–20)
CALCIUM: 8.9 mg/dL (ref 8.9–10.3)
CHLORIDE: 109 mmol/L (ref 101–111)
CO2: 20 mmol/L — AB (ref 22–32)
Creatinine, Ser: 1.27 mg/dL — ABNORMAL HIGH (ref 0.44–1.00)
GFR calc non Af Amer: 39 mL/min — ABNORMAL LOW (ref >60)
GFR, EST AFRICAN AMERICAN: 45 mL/min — AB (ref >60)
Glucose, Bld: 140 mg/dL — ABNORMAL HIGH (ref 65–99)
POTASSIUM: 4.5 mmol/L (ref 3.5–5.1)
SODIUM: 137 mmol/L (ref 135–145)
Total Bilirubin: 0.4 mg/dL (ref 0.3–1.2)
Total Protein: 7.1 g/dL (ref 6.5–8.1)

## 2017-01-19 LAB — PROTIME-INR
INR: 1.12
Prothrombin Time: 14.5 seconds (ref 11.4–15.2)

## 2017-01-19 LAB — CBC
HCT: 34.3 % — ABNORMAL LOW (ref 36.0–46.0)
HEMOGLOBIN: 10.8 g/dL — AB (ref 12.0–15.0)
MCH: 25.5 pg — AB (ref 26.0–34.0)
MCHC: 31.5 g/dL (ref 30.0–36.0)
MCV: 81.1 fL (ref 78.0–100.0)
PLATELETS: 290 10*3/uL (ref 150–400)
RBC: 4.23 MIL/uL (ref 3.87–5.11)
RDW: 15.8 % — AB (ref 11.5–15.5)
WBC: 5.6 10*3/uL (ref 4.0–10.5)

## 2017-01-19 LAB — APTT: APTT: 29 s (ref 24–36)

## 2017-01-19 LAB — GLUCOSE, CAPILLARY: GLUCOSE-CAPILLARY: 149 mg/dL — AB (ref 65–99)

## 2017-01-19 NOTE — Pre-Procedure Instructions (Signed)
[name] [name]  01/19/2017      [place], [place] [address] Phone: [phone] Fax: [phone]  [facility] - [place], [place] - [address] AT [place] [address] Phone: [phone] Fax: [phone]  [place], [place] - [name] [place] [address] Phone: [phone] Fax: [place], [place] [address] [place] [address] Phone: [phone] Fax: [phone]    Your procedure is scheduled on Friday, January 21, 2017  Report to [facility] at 5:30 A.M.  Call this number if you have problems the morning of surgery:  [phone]   Remember:  Do not eat food or drink liquids after midnight Thursday, January 20, 2017  Take these medicines the morning of surgery with A SIP OF WATER :amLODipine (NORVASC), hydrALAZINE (APRESOLINE), isosorbide dinitrate (ISORDIL), if needed: meclizine (ANTIVERT) for dizziness, nitroGLYCERIN (NITROSTAT) for chest pain  Stop taking vitamins, fish oil and herbal medications. Do not take any NSAIDs ie: Ibuprofen, Motrin, Advil, Naproxen, Excedrin Migraine, BC and Goody Powder; stop now.    How to Manage Your Diabetes Before and After Surgery  Why is it important to control my blood sugar before and after surgery? . Improving blood sugar levels before and after surgery helps healing and can limit problems. . A way of improving blood sugar control is eating a healthy diet by: o  Eating less sugar and carbohydrates o  Increasing activity/exercise o  Talking with your doctor about reaching your blood sugar goals . High blood sugars (greater than 180 mg/dL) can raise your risk of infections and slow your recovery, so you will need to focus on  controlling your diabetes during the weeks before surgery. . Make sure that the doctor who takes care of your diabetes knows about your planned surgery including the date and location.  How do I manage my blood sugar before surgery? . Check your blood sugar at least 4 times a day, starting 2 days before surgery, to make sure that the level is not too high or low. o Check your blood sugar the morning of your surgery when you wake up and every 2 hours until you get to the Short Stay unit. . If your blood sugar is less than 70 mg/dL, you will need to treat for low blood sugar: o Do not take insulin. o Treat a low blood sugar (less than 70 mg/dL) with  cup of clear juice (cranberry or apple), 4 glucose tablets, OR glucose gel. o Recheck blood sugar in 15 minutes after treatment (to make sure it is greater than 70 mg/dL). If your blood sugar is not greater than 70 mg/dL on recheck, call [phone] for further instructions. . Report your blood sugar to the short stay nurse when you get to Short Stay.  . If you are admitted to the hospital after surgery: o Your blood sugar will be checked by the staff and you will probably be given insulin after surgery (instead of oral diabetes medicines) to make sure you have good blood sugar levels. o The goal for blood sugar control after surgery is 80-180 mg/dL.  WHAT DO I DO ABOUT MY DIABETES MEDICATION?  Marland Kitchen Do not take oral diabetes medicines (pills)  the morning of surgery metFORMIN (GLUCOPHAGE) and pioglitazone (ACTOS)  Reviewed and Endorsed by [facility] Patient Education Committee, August 2015  Do not wear jewelry, make-up or nail polish.  Do not wear lotions, powders, or perfumes, or deoderant.  Do not shave 48 hours prior to surgery.    Do not bring valuables to the hospital.  [facility] is not responsible for any belongings or valuables.  Contacts, dentures or bridgework may not be worn into surgery.  Leave your suitcase in the car.  After  surgery it may be brought to your room. For patients admitted to the hospital, discharge time will be determined by your treatment team. Patients discharged the day of surgery will not be allowed to drive home.  Special instructions: Shower the night before surgery and the morning of surgery with CHG. Please read over the following fact sheets that you were given. Pain Booklet, Coughing and Deep Breathing and Surgical Site Infection Prevention

## 2017-01-19 NOTE — Progress Notes (Signed)
Pt denies SOB and chest pain but is under the care of Dr. Doylene Canard, Cardiology. Pt denies having a cardiac cath but stated that an echo was performed; records requested from Dr. Doylene Canard. Pt chart forwarded to anesthesia to review cardiac history.

## 2017-01-20 NOTE — Progress Notes (Addendum)
Anesthesia Chart Review: Patient is a 82 year old female scheduled for video bronchoscopy with endobronchial ultrasound and endobronchial navigation on 01/21/2017 by Dr. Roxan Hockey.  History includes smoking, HTN, DM2, CVA, CAD, chest pain (s/p LHC '01 mild CAD, s/p LHC '07 non-obstructive CAD; s/p stress test 07/2015 anteroseptal scar, no ischemia), palpitations, dyspnea, TKA, hysterectomy. Admission 12/29/16-12/31/16 by her cardiologist Dr. Doylene Canard for SOB, possible PNA. Chest CT was done showing spiculated LUL nodule worrisome for primary bronchogenic carcinoma. Pulmonology and CT surgery was consulted. Oncology consult was placed on hold until tissue diagnosis known. Out-patient PET scan, PFTs planned, and now the above procedure is recommended.  PCP is Dr. Willey Blade. Cardiologist is Dr. Dixie Dials.  Meds include amlodipine, ASA 81 mg, Excedrine Migraine, Lipitor, hydralazine, Isordil, meclizine, metformin, Toprol-XL, nitroglycerin, Actos, valsartan.  BP (!) 118/55   Pulse 71   Temp 36.4 C   Resp 18   Ht 5\' 9"  (1.753 m)   Wt 193 lb 12.8 oz (87.9 kg)   SpO2 98%   BMI 28.62 kg/m   EKG 10/22/16: NSR.  Nuclear stress test 07/23/15: IMPRESSION: 1. Stable fixed anteroseptal defect consistent with myocardial scar. No evidence of stress-induced myocardial ischemia. 2. Normal left ventricular wall motion. 3. Left ventricular ejection fraction 55% 4. Low-risk stress test findings*.  Echo 07/07/15 ([facility]): Impression: Normal LV systolic function, mild diastolic function. Mild TR and MR.  Cardiac cath 07/07/06:  1. The left main coronary artery was unremarkable. 2. Left anterior descending coronary artery.  The left anterior descending coronary artery had proximal 20-30% eccentric lesion in the proximal ectatic area and following that it had post diagonal origin 20% lesion.  The rest of the vessel was unremarkable and it wrapped around the apex of the heart supplying half of the  posterior septum. The diagonal I vessel had mild ostial 20% concentric lesion. 3. Left circumflex coronary artery.  The left circumflex coronary artery was essentially unremarkable with some luminal irregularities and the ramus branch had a proximal 30% eccentric lesion. Its obtuse marginal branches were small vessels. 4. Right coronary artery.  The right coronary artery was codominant with left circumflex coronary artery.  It had a proximal 50% concentric lesion and had luminal irregularities throughout the vessel with occasional 20-30% eccentric lesion. The posterolateral branch was unremarkable and posterior descending coronary artery was a small vessel. IMPRESSION: 1. Multivessel native vessel coronary artery disease. 2. Hypertensive heart disease. RECOMMENDATIONS:  This patient will continue medical therapy with lifestyle modification. Addition of HMG coenzyme A reductase medication like lipitor and smoking cessation, decreasing salt intake, etc.  CXR 01/19/17: IMPRESSION: Stable diffuse interstitial densities throughout both lungs most consistent with scarring. Stable ill-defined density seen in left upper lobe concerning for possible neoplasm. Aortic atherosclerosis.  PET scan 01/12/17: IMPRESSION: 1. Solitary, spiculated hypermetabolic left upper lobe nodule without evidence metastatic disease (most consistent with stage IA disease -T1aN0M0). 2. No other suspicious metabolic activity. 3. Aortic Atherosclerosis (ICD10-I70.0) and Emphysema (ICD10-J43.9).  Chest CT with contrast 12/30/16: IMPRESSION: 1. Spiculated left upper lobe pulmonary nodule, consistent with primary bronchogenic carcinoma. 2. Borderline to mild contralateral mediastinal adenopathy could be reactive or represent nodal metastasis. Consider further characterization with PET. 3. Centrilobular and paraseptal emphysema. 4. 6 mm right upper lobe pulmonary nodule, nonspecific. 5. Pulmonary artery enlargement suggests  pulmonary arterial hypertension. 6. Cardiomegaly. Coronary artery atherosclerosis. Aortic atherosclerosis.  PFTs 01/18/17: FVC 1.87 (71%), FEV1 1.40 (68%). Effort was marginal at best. DLCO was attempted multiple times but without success.  Preoperative labs noted. Cr 1.27. Glucose 140. H/H 10.8/34.3. PT/PTT WNL. A1c 6.1 on 12/31/16.   She has recent cardiology evaluation with admission and found to have pulmonary lesion. Stress test without last two years was non-ischemic. EKG NSR. DM well controlled. She denied CP, SOB at PAT. If no acute changes then I anticipate that she can proceed as planned.  George Hugh [facility]/Anesthesiology Phone [phone] 01/20/2017 10:32 AM

## 2017-01-21 ENCOUNTER — Ambulatory Visit (HOSPITAL_COMMUNITY): Payer: Medicare Other | Admitting: Vascular Surgery

## 2017-01-21 ENCOUNTER — Ambulatory Visit (HOSPITAL_COMMUNITY)
Admission: RE | Admit: 2017-01-21 | Discharge: 2017-01-21 | Disposition: A | Discharge status: Home / Self Care | Payer: Medicare Other | Source: Ambulatory Visit | Attending: Thoracic Surgery (Cardiothoracic Vascular Surgery) | Admitting: Thoracic Surgery (Cardiothoracic Vascular Surgery)

## 2017-01-21 ENCOUNTER — Encounter (HOSPITAL_COMMUNITY)
Admission: RE | Disposition: A | Discharge status: Home / Self Care | Payer: Self-pay | Source: Ambulatory Visit | Attending: Thoracic Surgery (Cardiothoracic Vascular Surgery)

## 2017-01-21 ENCOUNTER — Ambulatory Visit (HOSPITAL_COMMUNITY): Payer: Medicare Other | Admitting: Certified Registered Nurse Anesthetist

## 2017-01-21 ENCOUNTER — Encounter (HOSPITAL_COMMUNITY): Payer: Self-pay

## 2017-01-21 ENCOUNTER — Ambulatory Visit (HOSPITAL_COMMUNITY): Payer: Medicare Other

## 2017-01-21 DIAGNOSIS — M1990 Unspecified osteoarthritis, unspecified site: Secondary | ICD-10-CM | POA: Diagnosis not present

## 2017-01-21 DIAGNOSIS — I10 Essential (primary) hypertension: Secondary | ICD-10-CM | POA: Diagnosis not present

## 2017-01-21 DIAGNOSIS — C3412 Malignant neoplasm of upper lobe, left bronchus or lung: Secondary | ICD-10-CM | POA: Insufficient documentation

## 2017-01-21 DIAGNOSIS — Z8673 Personal history of transient ischemic attack (TIA), and cerebral infarction without residual deficits: Secondary | ICD-10-CM | POA: Diagnosis not present

## 2017-01-21 DIAGNOSIS — Z96659 Presence of unspecified artificial knee joint: Secondary | ICD-10-CM | POA: Diagnosis not present

## 2017-01-21 DIAGNOSIS — R590 Localized enlarged lymph nodes: Secondary | ICD-10-CM | POA: Diagnosis not present

## 2017-01-21 DIAGNOSIS — Z90710 Acquired absence of both cervix and uterus: Secondary | ICD-10-CM | POA: Insufficient documentation

## 2017-01-21 DIAGNOSIS — Z9889 Other specified postprocedural states: Secondary | ICD-10-CM

## 2017-01-21 DIAGNOSIS — R918 Other nonspecific abnormal finding of lung field: Secondary | ICD-10-CM | POA: Diagnosis present

## 2017-01-21 DIAGNOSIS — Z419 Encounter for procedure for purposes other than remedying health state, unspecified: Secondary | ICD-10-CM

## 2017-01-21 DIAGNOSIS — E119 Type 2 diabetes mellitus without complications: Secondary | ICD-10-CM | POA: Diagnosis not present

## 2017-01-21 DIAGNOSIS — Z7982 Long term (current) use of aspirin: Secondary | ICD-10-CM | POA: Insufficient documentation

## 2017-01-21 DIAGNOSIS — I2510 Atherosclerotic heart disease of native coronary artery without angina pectoris: Secondary | ICD-10-CM | POA: Insufficient documentation

## 2017-01-21 DIAGNOSIS — Z7984 Long term (current) use of oral hypoglycemic drugs: Secondary | ICD-10-CM | POA: Insufficient documentation

## 2017-01-21 DIAGNOSIS — Z79899 Other long term (current) drug therapy: Secondary | ICD-10-CM | POA: Insufficient documentation

## 2017-01-21 HISTORY — PX: VIDEO BRONCHOSCOPY WITH ENDOBRONCHIAL ULTRASOUND: SHX6177

## 2017-01-21 HISTORY — PX: VIDEO BRONCHOSCOPY WITH ENDOBRONCHIAL NAVIGATION: SHX6175

## 2017-01-21 LAB — GLUCOSE, CAPILLARY
Glucose-Capillary: 121 mg/dL — ABNORMAL HIGH (ref 65–99)
Glucose-Capillary: 161 mg/dL — ABNORMAL HIGH (ref 65–99)

## 2017-01-21 SURGERY — VIDEO BRONCHOSCOPY WITH ENDOBRONCHIAL NAVIGATION
Anesthesia: General | Site: Chest

## 2017-01-21 MED ORDER — SUGAMMADEX SODIUM 200 MG/2ML IV SOLN
INTRAVENOUS | Status: DC | PRN
  Administered 2017-01-21: 100 mg via INTRAVENOUS

## 2017-01-21 MED ORDER — PHENYLEPHRINE 40 MCG/ML (10ML) SYRINGE FOR IV PUSH (FOR BLOOD PRESSURE SUPPORT)
PREFILLED_SYRINGE | INTRAVENOUS | Status: AC
Start: 2017-01-21 — End: ?
  Filled 2017-01-21: qty 10

## 2017-01-21 MED ORDER — ONDANSETRON HCL 4 MG/2ML IJ SOLN
INTRAMUSCULAR | Status: AC
  Filled 2017-01-21: qty 2

## 2017-01-21 MED ORDER — LIDOCAINE 2% (20 MG/ML) 5 ML SYRINGE
INTRAMUSCULAR | Status: DC | PRN
  Administered 2017-01-21: 60 mg via INTRAVENOUS

## 2017-01-21 MED ORDER — PROPOFOL 10 MG/ML IV BOLUS
INTRAVENOUS | Status: DC | PRN
  Administered 2017-01-21: 10 mg via INTRAVENOUS
  Administered 2017-01-21: 140 mg via INTRAVENOUS

## 2017-01-21 MED ORDER — ROCURONIUM BROMIDE 10 MG/ML (PF) SYRINGE
PREFILLED_SYRINGE | INTRAVENOUS | Status: DC | PRN
  Administered 2017-01-21: 40 mg via INTRAVENOUS

## 2017-01-21 MED ORDER — PROPOFOL 10 MG/ML IV BOLUS
INTRAVENOUS | Status: AC
  Filled 2017-01-21: qty 20

## 2017-01-21 MED ORDER — FENTANYL CITRATE (PF) 250 MCG/5ML IJ SOLN
INTRAMUSCULAR | Status: DC | PRN
  Administered 2017-01-21 (×2): 25 ug via INTRAVENOUS
  Administered 2017-01-21: 100 ug via INTRAVENOUS
  Administered 2017-01-21: 25 ug via INTRAVENOUS

## 2017-01-21 MED ORDER — ONDANSETRON HCL 4 MG/2ML IJ SOLN
INTRAMUSCULAR | Status: DC | PRN
  Administered 2017-01-21: 4 mg via INTRAVENOUS

## 2017-01-21 MED ORDER — DEXAMETHASONE SODIUM PHOSPHATE 10 MG/ML IJ SOLN
INTRAMUSCULAR | Status: DC | PRN
  Administered 2017-01-21: 10 mg via INTRAVENOUS

## 2017-01-21 MED ORDER — LIDOCAINE 2% (20 MG/ML) 5 ML SYRINGE
INTRAMUSCULAR | Status: AC
  Filled 2017-01-21: qty 5

## 2017-01-21 MED ORDER — ROCURONIUM BROMIDE 10 MG/ML (PF) SYRINGE
PREFILLED_SYRINGE | INTRAVENOUS | Status: AC
  Filled 2017-01-21: qty 5

## 2017-01-21 MED ORDER — EPINEPHRINE PF 1 MG/ML IJ SOLN
INTRAMUSCULAR | Status: AC
  Filled 2017-01-21: qty 1

## 2017-01-21 MED ORDER — 0.9 % SODIUM CHLORIDE (POUR BTL) OPTIME
TOPICAL | Status: DC | PRN
  Administered 2017-01-21: 1000 mL

## 2017-01-21 MED ORDER — LACTATED RINGERS IV SOLN
INTRAVENOUS | Status: DC | PRN
  Administered 2017-01-21: 07:00:00 via INTRAVENOUS

## 2017-01-21 MED ORDER — EPHEDRINE SULFATE-NACL 50-0.9 MG/10ML-% IV SOSY
PREFILLED_SYRINGE | INTRAVENOUS | Status: DC | PRN
  Administered 2017-01-21 (×3): 10 mg via INTRAVENOUS

## 2017-01-21 MED ORDER — FENTANYL CITRATE (PF) 250 MCG/5ML IJ SOLN
INTRAMUSCULAR | Status: AC
  Filled 2017-01-21: qty 5

## 2017-01-21 MED ORDER — FENTANYL CITRATE (PF) 100 MCG/2ML IJ SOLN: 25.0000 ug | INTRAMUSCULAR | Status: DC | PRN

## 2017-01-21 MED ORDER — EPHEDRINE 5 MG/ML INJ
INTRAVENOUS | Status: AC
  Filled 2017-01-21: qty 10

## 2017-01-21 MED ORDER — SUCCINYLCHOLINE CHLORIDE 200 MG/10ML IV SOSY
PREFILLED_SYRINGE | INTRAVENOUS | Status: AC
  Filled 2017-01-21: qty 10

## 2017-01-21 MED ORDER — ONDANSETRON HCL 4 MG/2ML IJ SOLN: 4.0000 mg | Freq: Once | INTRAMUSCULAR | Status: DC | PRN

## 2017-01-21 SURGICAL SUPPLY — 52 items
ADAPTER BRONCH F/PENTAX (ADAPTER) ×3 IMPLANT
ADPR BSCP EDG PNTX (ADAPTER) ×1
BRUSH BIOPSY BRONCH 10 SDTNB (MISCELLANEOUS) ×1 IMPLANT
BRUSH BIOPSY BRONCH 10MM SDTNB (MISCELLANEOUS) ×1
BRUSH CYTOL CELLEBRITY 1.5X140 (MISCELLANEOUS) IMPLANT
BRUSH SUPERTRAX BIOPSY (INSTRUMENTS) IMPLANT
BRUSH SUPERTRAX NDL-TIP CYTO (INSTRUMENTS) ×3 IMPLANT
CANISTER SUCT 3000ML PPV (MISCELLANEOUS) ×6 IMPLANT
CHANNEL WORK EXTEND EDGE 180 (KITS) IMPLANT
CHANNEL WORK EXTEND EDGE 45 (KITS) IMPLANT
CHANNEL WORK EXTEND EDGE 90 (KITS) IMPLANT
CONT SPEC 4OZ CLIKSEAL STRL BL (MISCELLANEOUS) ×9 IMPLANT
COVER BACK TABLE 60X90IN (DRAPES) ×6 IMPLANT
COVER DOME SNAP 22 D (MISCELLANEOUS) ×5 IMPLANT
FILTER STRAW FLUID ASPIR (MISCELLANEOUS) IMPLANT
FORCEPS BIOP RJ4 1.8 (CUTTING FORCEPS) IMPLANT
FORCEPS BIOP SUPERTRX PREMAR (INSTRUMENTS) ×2 IMPLANT
GAUZE SPONGE 4X4 12PLY STRL (GAUZE/BANDAGES/DRESSINGS) ×5 IMPLANT
GLOVE BIO SURGEON STRL SZ 6.5 (GLOVE) ×1 IMPLANT
GLOVE BIO SURGEONS STRL SZ 6.5 (GLOVE) ×1
GLOVE SURG SIGNA 7.5 PF LTX (GLOVE) ×6 IMPLANT
GOWN STRL REUS W/ TWL XL LVL3 (GOWN DISPOSABLE) ×2 IMPLANT
GOWN STRL REUS W/TWL XL LVL3 (GOWN DISPOSABLE) ×6
KIT CLEAN ENDO COMPLIANCE (KITS) ×11 IMPLANT
KIT PROCEDURE EDGE 180 (KITS) ×2 IMPLANT
KIT PROCEDURE EDGE 45 (KITS) IMPLANT
KIT PROCEDURE EDGE 90 (KITS) IMPLANT
KIT ROOM TURNOVER OR (KITS) ×6 IMPLANT
MARKER SKIN DUAL TIP RULER LAB (MISCELLANEOUS) ×6 IMPLANT
NDL BLUNT 18X1 FOR OR ONLY (NEEDLE) IMPLANT
NDL ECHOTIP HI DEF 22GA (NEEDLE) IMPLANT
NDL SUPERTRX PREMARK BIOPSY (NEEDLE) IMPLANT
NEEDLE 22X1 1/2 (OR ONLY) (NEEDLE) IMPLANT
NEEDLE BLUNT 18X1 FOR OR ONLY (NEEDLE) IMPLANT
NEEDLE ECHOTIP HI DEF 22GA (NEEDLE) ×3 IMPLANT
NEEDLE SUPERTRX PREMARK BIOPSY (NEEDLE) ×3 IMPLANT
NS IRRIG 1000ML POUR BTL (IV SOLUTION) ×6 IMPLANT
OIL SILICONE PENTAX (PARTS (SERVICE/REPAIRS)) ×6 IMPLANT
PAD ARMBOARD 7.5X6 YLW CONV (MISCELLANEOUS) ×12 IMPLANT
PATCHES PATIENT (LABEL) ×9 IMPLANT
SYR 20CC LL (SYRINGE) ×6 IMPLANT
SYR 20ML ECCENTRIC (SYRINGE) ×6 IMPLANT
SYR 30ML LL (SYRINGE) ×3 IMPLANT
SYR 3ML LL SCALE MARK (SYRINGE) IMPLANT
SYR 5ML LL (SYRINGE) ×4 IMPLANT
SYR 5ML LUER SLIP (SYRINGE) ×3 IMPLANT
TOWEL GREEN STERILE FF (TOWEL DISPOSABLE) ×6 IMPLANT
TRAP SPECIMEN MUCOUS 40CC (MISCELLANEOUS) ×6 IMPLANT
TUBE CONNECTING 20'X1/4 (TUBING) ×3
TUBE CONNECTING 20X1/4 (TUBING) ×6 IMPLANT
UNDERPAD 30X30 (UNDERPADS AND DIAPERS) ×3 IMPLANT
WATER STERILE IRR 1000ML POUR (IV SOLUTION) ×3 IMPLANT

## 2017-01-21 NOTE — Discharge Instructions (Addendum)
Do not drive or engage in heavy physical activity for 24 hours  You may resume normal activities tomorrow  You may cough up small amounts of blood over the next several days. Call if you cough up more than 2 tablespoons of blood.  You may use an over the counter cough medication if needed  You may use acetaminophen (Tylenol) if needed for pain.  Call [phone] if you develop chest pain, shortness of breath or cough up more than 2 tablespoons of blood  My office will contact you with a follow up appointment

## 2017-01-21 NOTE — Anesthesia Postprocedure Evaluation (Signed)
Anesthesia Post Note  Patient: [name] [name]  Procedure(s) Performed: Procedure(s) (LRB): VIDEO BRONCHOSCOPY WITH ENDOBRONCHIAL NAVIGATION (N/A) VIDEO BRONCHOSCOPY WITH ENDOBRONCHIAL ULTRASOUND (N/A)     Patient location during evaluation: PACU Anesthesia Type: General Level of consciousness: awake and alert Pain management: pain level controlled Vital Signs Assessment: post-procedure vital signs reviewed and stable Respiratory status: spontaneous breathing, nonlabored ventilation, respiratory function stable and patient connected to nasal cannula oxygen Cardiovascular status: blood pressure returned to baseline and stable Postop Assessment: no signs of nausea or vomiting Anesthetic complications: no    Last Vitals:  Vitals:   01/21/17 1035 01/21/17 1043  BP: (!) 118/52   Pulse: 71 71  Resp: 19 18  Temp:      Last Pain:  Vitals:   01/21/17 0945  TempSrc:   PainSc: Asleep                 Ryan P Ellender

## 2017-01-21 NOTE — Transfer of Care (Signed)
Immediate Anesthesia Transfer of Care Note  Patient: [name] [name]  Procedure(s) Performed: Procedure(s): VIDEO BRONCHOSCOPY WITH ENDOBRONCHIAL NAVIGATION (N/A) VIDEO BRONCHOSCOPY WITH ENDOBRONCHIAL ULTRASOUND (N/A)  Patient Location: PACU  Anesthesia Type:General  Level of Consciousness: awake, alert , oriented and patient cooperative  Airway & Oxygen Therapy: Patient Spontanous Breathing and Patient connected to nasal cannula oxygen  Post-op Assessment: Report given to RN, Post -op Vital signs reviewed and stable and Patient moving all extremities X 4  Post vital signs: Reviewed and stable  Last Vitals:  Vitals:   01/21/17 0550 01/21/17 0935  BP: (!) 162/51   Pulse: 68   Resp: 18   Temp: 36.8 C 36.5 C    Last Pain:  Vitals:   01/21/17 0550  TempSrc: Oral         Complications: No apparent anesthesia complications

## 2017-01-21 NOTE — Brief Op Note (Signed)
01/21/2017  9:23 AM  PATIENT:  [name] [name]  82 y.o. female  PRE-OPERATIVE DIAGNOSIS:  left upper lobe mass, mediastinal adenopathy  POST-OPERATIVE DIAGNOSIS:  left upper lobe mass, mediastinal adenopathy  PROCEDURE:  Procedure(s): VIDEO BRONCHOSCOPY WITH ENDOBRONCHIAL NAVIGATION (N/A) VIDEO BRONCHOSCOPY WITH ENDOBRONCHIAL ULTRASOUND (N/A)  SURGEON:  Surgeon(s) and Role:    * Melrose Nakayama, MD - Primary   ANESTHESIA:   general  EBL:  Total I/O In: -  Out: 2 [Blood:2]  BLOOD ADMINISTERED:none  DRAINS: none   LOCAL MEDICATIONS USED:  NONE  SPECIMEN:  Source of Specimen:  Lymph nodes, LUL mass  DISPOSITION OF SPECIMEN:  PATHOLOGY  PLAN OF CARE: Discharge to home after PACU  PATIENT DISPOSITION:  PACU - hemodynamically stable.   Delay start of Pharmacological VTE agent (>24hrs) due to surgical blood loss or risk of bleeding: not applicable

## 2017-01-21 NOTE — Anesthesia Preprocedure Evaluation (Addendum)
Anesthesia Evaluation  Patient identified by MRN, date of birth, ID band Patient awake    Reviewed: Allergy & Precautions, NPO status , Patient's Chart, lab work & pertinent test results  Airway Mallampati: II  TM Distance: >3 FB Neck ROM: Full    Dental no notable dental hx. (+) Poor Dentition, Missing, Edentulous Upper,    Pulmonary Current Smoker,    Pulmonary exam normal breath sounds clear to auscultation       Cardiovascular hypertension, Pt. on medications and Pt. on home beta blockers + angina (stable) + CAD  Normal cardiovascular exam Rhythm:Regular Rate:Normal  ECG: NSR, rate 65  Sees cardiologist Dr. Dixie Dials.  Nuclear stress test 07/23/15: IMPRESSION: 1. Stable fixed anteroseptal defect consistent with myocardial scar. No evidence of stress-induced myocardial ischemia. 2. Normal left ventricular wall motion. 3. Left ventricular ejection fraction 55% 4. Low-risk stress test findings*.  Echo 07/07/15 ([facility]): Impression: Normal LV systolic function, mild diastolic function. Mild TR and MR.   Neuro/Psych TIANo Residual Symptoms negative psych ROS   GI/Hepatic negative GI ROS, Neg liver ROS,   Endo/Other  diabetes, Oral Hypoglycemic Agents  Renal/GU negative Renal ROS  negative genitourinary   Musculoskeletal negative musculoskeletal ROS (+)   Abdominal   Peds negative pediatric ROS (+)  Hematology  (+) anemia ,   Anesthesia Other Findings Hyperlipidemia   Reproductive/Obstetrics negative OB ROS                           Anesthesia Physical Anesthesia Plan  ASA: IV  Anesthesia Plan: General   Post-op Pain Management:    Induction: Intravenous  PONV Risk Score and Plan: 2 and Ondansetron, Dexamethasone and Propofol  Airway Management Planned: Oral ETT  Additional Equipment:   Intra-op Plan:   Post-operative Plan: Extubation in OR  Informed Consent: I  have reviewed the patients History and Physical, chart, labs and discussed the procedure including the risks, benefits and alternatives for the proposed anesthesia with the patient or authorized representative who has indicated his/her understanding and acceptance.   Dental advisory given  Plan Discussed with: CRNA and Anesthesiologist  Anesthesia Plan Comments:       Anesthesia Quick Evaluation

## 2017-01-21 NOTE — Interval H&P Note (Signed)
History and Physical Interval Note:  01/21/2017 7:27 AM  [name] [name]  has presented today for surgery, with the diagnosis of left upper lobe mass, mediastinal Adenopathy  The various methods of treatment have been discussed with the patient and family. After consideration of risks, benefits and other options for treatment, the patient has consented to  Procedure(s): VIDEO BRONCHOSCOPY WITH ENDOBRONCHIAL NAVIGATION (N/A) VIDEO BRONCHOSCOPY WITH ENDOBRONCHIAL ULTRASOUND (N/A) as a surgical intervention .  The patient's history has been reviewed, patient examined, no change in status, stable for surgery.  I have reviewed the patient's chart and labs.  Questions were answered to the patient's satisfaction.     Melrose Nakayama

## 2017-01-21 NOTE — H&P (View-Only) (Signed)
[address]       [place] [address]             [phone]    HPI: [name] returns today to discuss the results for PET/CT and further discuss management of her left upper lobe nodule.  Mrs. Cerra is a 82 year old woman with a history of tobacco abuse who presented with a two-week history of right-sided anterior chest wall pain. A chest x-ray suggested a possible pneumonia and she was admitted and started on empiric antibiotics. She denied any fevers or chills or productive cough leading up to the admission. A CT scan in the hospital showed a 1.7 cm spiculated nodule in the left upper lobe. There was a borderline enlarged paratracheal lymph node.  She continues to smoke about one half a pack of cigarettes daily. Her right anterior chest wall pain has almost completely resolved. She denies any shortness of breath.  Past Medical History:  Diagnosis Date  . Arthritis   . Coronary artery disease   . Diabetes mellitus   . Dyspnea   . Hypertension   . Stroke [facility])    Past Surgical History:  Procedure Laterality Date  . ABDOMINAL HYSTERECTOMY    . ANKLE SURGERY    . TOTAL KNEE ARTHROPLASTY      Current Outpatient Prescriptions  Medication Sig Dispense Refill  . albuterol (PROVENTIL HFA;VENTOLIN HFA) 108 (90 BASE) MCG/ACT inhaler Inhale 2 puffs into the lungs 4 (four) times daily. 1 Inhaler 0  . amLODipine (NORVASC) 10 MG tablet Take 10 mg by mouth daily.      Marland Kitchen aspirin EC 81 MG tablet Take 81 mg by mouth daily.      Marland Kitchen atorvastatin (LIPITOR) 10 MG tablet Take 10 mg by mouth daily.  5  . benzonatate (TESSALON) 100 MG capsule Take 100-200 mg by mouth 3 (three) times daily as needed for cough.    . hydrALAZINE (APRESOLINE) 25 MG tablet Take 25 mg by mouth daily.    . isosorbide dinitrate (ISORDIL) 20 MG tablet Take 20 mg by mouth 3 (three) times daily.    . meclizine (ANTIVERT) 25 MG tablet Take 25 mg by mouth 2 (two) times daily as needed for dizziness.      . metFORMIN (GLUCOPHAGE) 500 MG tablet Take 1,000 mg by mouth 2 (two) times daily with a meal.    . metoprolol succinate (TOPROL-XL) 25 MG 24 hr tablet Take 25 mg by mouth daily.    . metoprolol tartrate (LOPRESSOR) 25 MG tablet Take 25 mg by mouth 2 (two) times daily.    . nitroGLYCERIN (NITROSTAT) 0.4 MG SL tablet TK 1 T UNDER THE TONGUE Q 5 MINUTES X 3 DOSES PRF CHEST PAIN  0  . pioglitazone (ACTOS) 30 MG tablet Take 30 mg by mouth daily.    . valsartan (DIOVAN) 160 MG tablet Take 160 mg by mouth daily.     No current facility-administered medications for this visit.     Physical Exam BP (!) 155/74 (BP Location: Right Arm, Patient Position: Sitting, Cuff Size: Large)   Pulse 73   Resp 16   Ht 5\' 9"  (1.753 m)   Wt 194 lb (88 kg)   SpO2 96% Comment: ON RA  BMI 28.7 kg/m  82 year old woman in no acute distress Alert and oriented 3 with no focal neurologic deficits Cardiac regular rate and rhythm normal S1 and S2 Lungs clear equal breath sounds bilaterally  Diagnostic Tests: NUCLEAR MEDICINE  PET SKULL BASE TO THIGH  TECHNIQUE: 9.78 mCi F-18 FDG was injected intravenously. Full-ring PET imaging was performed from the skull base to thigh after the radiotracer. CT data was obtained and used for attenuation correction and anatomic localization.  FASTING BLOOD GLUCOSE:  Value: 127 mg/dl  COMPARISON:  Chest CT 12/30/2016  FINDINGS: NECK  No hypermetabolic cervical lymph nodes are identified.There are no lesions of the pharyngeal mucosal space. There is physiologic activity associated with the muscles of phonation. Carotid atherosclerosis noted bilaterally.  CHEST  There are no hypermetabolic mediastinal, hilar or axillary lymph nodes. Specifically, the previously demonstrated right paratracheal and precarinal lymph nodes demonstrate activity similar to blood pool. The spiculated left upper lobe nodule is hypermetabolic. This nodule measures 18 x 18 mm on image  17 and has an SUV max of 15.1. No other hypermetabolic pulmonary activity. There is no abnormal activity within the small subpleural right upper lobe nodule (image 25), suboptimally evaluated based on size. Underlying centrilobular and paraseptal emphysema, cardiomegaly and atherosclerosis again noted.  ABDOMEN/PELVIS  There is no hypermetabolic activity within the liver, adrenal glands, spleen or pancreas. There is no hypermetabolic nodal activity. Diffuse aortic and branch vessel atherosclerosis noted. Previous cholecystectomy and hysterectomy with probable pelvic floor laxity.  SKELETON  There is no hypermetabolic activity to suggest osseous metastatic disease. Mild soft tissue activity lateral to the left femoral neck, likely inflammatory.  IMPRESSION: 1. Solitary, spiculated hypermetabolic left upper lobe nodule without evidence metastatic disease (most consistent with stage IA disease -T1aN0M0). 2. No other suspicious metabolic activity. 3. Aortic Atherosclerosis (ICD10-I70.0) and Emphysema (ICD10-J43.9).   Electronically Signed   By: Richardean Sale M.D.   On: 01/12/2017 11:39 I personally reviewed the PET CT images and concur with the findings as noted above. I am still concerned about the 4R paratracheal lymph node even though it was not definitively hypermetabolic on PET/CT.  Impression: [name] is a 82 year old woman with a history of tobacco abuse who has a 1.8 cm hypermetabolic nodule in the left upper lobe. This almost certainly is a new primary bronchogenic carcinoma and has to be considered that unless it can be proven otherwise. I recommended that we proceed with a navigational bronchoscopy and endobronchial ultrasound to attempt to biopsy the lesion and to stage the mediastinal lymph nodes.   She was accompanied by her daughters for her visit today. I informed them of the reasons for my recommendation to stage the mediastinum prior to definitive  treatment of the left upper lobe nodule, whether surgery or radiation. I informed him of the general nature of the procedure including the need for general anesthesia. They are aware his endoscopic plan to do it on an outpatient basis. I informed him of the indications, risks, benefits, and alternatives. They understand the risks include, but are not limited to those associated with general anesthesia, bleeding, pneumothorax, and failure to make a diagnosis.  After a long discussion and many questions, she understands the risks and agrees to proceed.  In preparation for making a decision as to how to treat the nodule should the mediastinal nodes be negative, we will also get pulmonary function testing with and without bronchodilators.  Plan: PFTs with and without bronchodilators  Navigational bronchoscopy and endobronchial ultrasound on Thursday, 01/20/2017  Melrose Nakayama, MD [facility] [phone]

## 2017-01-21 NOTE — Anesthesia Procedure Notes (Addendum)
Procedure Name: Intubation Date/Time: 01/21/2017 7:43 AM Performed by: Julieta Bellini Pre-anesthesia Checklist: Patient identified, Emergency Drugs available, Suction available and Patient being monitored Patient Re-evaluated:Patient Re-evaluated prior to inductionOxygen Delivery Method: Circle system utilized Preoxygenation: Pre-oxygenation with 100% oxygen Intubation Type: IV induction Ventilation: Mask ventilation without difficulty and Oral airway inserted - appropriate to patient size Laryngoscope Size: Mac and 3 Grade View: Grade II Tube type: Oral Tube size: 8.5 mm Number of attempts: 1 Airway Equipment and Method: Stylet Placement Confirmation: ETT inserted through vocal cords under direct vision,  positive ETCO2 and breath sounds checked- equal and bilateral Secured at: 24 cm Tube secured with: Tape Dental Injury: Teeth and Oropharynx as per pre-operative assessment

## 2017-01-22 ENCOUNTER — Encounter (HOSPITAL_COMMUNITY): Payer: Self-pay | Admitting: Thoracic Surgery (Cardiothoracic Vascular Surgery)

## 2017-01-31 NOTE — Op Note (Signed)
NAME:  [name] [name], [name] [name]               ACCOUNT NO.:  [id number]  MEDICAL RECORD NO.:  [id number]  LOCATION:  [facility]                         FACILITY:  [facility]  PHYSICIAN:  Revonda Standard. Roxan Hockey, M.D.DATE OF BIRTH:  1935-04-08  DATE OF PROCEDURE:  01/31/2017 DATE OF DISCHARGE:                              OPERATIVE REPORT   PREOPERATIVE DIAGNOSIS:  Left upper lobe mass.  POSTOPERATIVE DIAGNOSIS:  Non-small cell carcinoma, left upper lobe.  PROCEDURE:   Electromagnetic navigational bronchoscopy with needle aspirations, brushings, and biopsies Endobronchial ultrasound with mediastinal lymph node aspirations.  SURGEON:  Revonda Standard. Roxan Hockey, M.D.  ASSISTANT:  None.  ANESTHESIA:  General.  FINDINGS:  Quick prep of nodes- no tumor was seen. Suspicious cells on sampling from left upper lobe but no definitive diagnosis.  CLINICAL NOTE:  [name] is a 82 year old woman who was sent for consultation regarding a left upper lobe mass.  She does have a history of tobacco abuse.  A CT showed a 1.7-cm spiculated nodule and a borderline enlarged paratracheal lymph node.  On PET-CT, the left upper lobe nodule was hypermetabolic with an SUV of 15.4.  There was no activity in the mediastinal lymph nodes.  The patient was advised to undergo navigational bronchoscopy and endobronchial ultrasound for diagnostic and staging purposes.  The indications, risks, benefits, and alternatives were discussed in detail with the patient.  She understood and accepted the risks and agreed to proceed.  OPERATIVE NOTE:  [name] was brought to the operating room on January 31, 2017. Sequential compression devices were placed on the calves for DVT prophylaxis She had induction of general anesthesia and was intubated. After performing a time-out, flexible fiberoptic bronchoscopy was performed via the endotracheal tube. It revealed normal endobronchial anatomy with no endobronchial lesions to the level of  subsegmental bronchi.  The endobronchial ultrasound probe then was advanced. Systematic inspection was made of the mediastinal lymph node stations.  There were relatively enlarged 4R and 7 nodes and a smaller 4L node.  Aspirations were performed from each of these nodes. With each aspiration, the needle was advanced into the lymph node with ultrasound visualization and 15 or more passes were made in the node.  Some samples were obtained with suction applied and others were obtained without.  Part of each specimen was placed onto slides for immediate examination, the remainder was placed into cytologic preparation fluid for cell block.  Each node was aspirated 3 times.  The slides were sent for quick prep. The endobronchial ultrasound probe was removed.  The bronchoscope was then replaced and the locatable guide for navigation was placed through the scope and registration was performed. There was good correlation of the video and virtual bronchoscopy. The scope then was directed to the left upper lobe bronchus. The appropriate subsegmental bronchus was cannulated with the locatable guide, which was advanced to within a centimeter of the left upper lobe nodule.  Needle aspirations and brushings were performed.  The specimens were sent for immediate examination.  Multiple biopsies then were obtained.  All sampling was done with fluoroscopic visualization.  The biopsies were sent for permanent pathology only.  The mediastinal lymph node aspirations came back with no tumor  seen nor was there any definitive tumor seen on the needle aspirations or brushings.  After completing the sampling, the sheath was removed, a final inspection was made with the bronchoscope, there was no ongoing bleeding.  Fluoroscopy showed no evidence of pneumothorax.  The patient was extubated in the operating room and taken to the postanesthetic care unit in good condition.     Revonda Standard Roxan Hockey,  M.D.     SCH/MEDQ  D:  01/31/2017  T:  01/31/2017  Job:  [id number]

## 2017-02-03 ENCOUNTER — Telehealth: Payer: Self-pay | Admitting: *Deleted

## 2017-02-03 ENCOUNTER — Institutional Professional Consult (permissible substitution) (INDEPENDENT_AMBULATORY_CARE_PROVIDER_SITE_OTHER): Payer: Medicare Other | Admitting: Thoracic Surgery (Cardiothoracic Vascular Surgery)

## 2017-02-03 DIAGNOSIS — C3492 Malignant neoplasm of unspecified part of left bronchus or lung: Secondary | ICD-10-CM | POA: Diagnosis not present

## 2017-02-03 NOTE — Progress Notes (Signed)
      [address]       [place] [address]             [phone]      Mrs. Teegarden returns to discuss the results of her recent bronch/ EBUS   She is a 82 yo woman with a history of tobacco abuse, CAD, diabetes, hypertension, arthritis and stroke, who recently was found to have a left upper lobe nodule. The nodule was hypermetabolic on PET. She had a mildly enlarged right paratracheal nodes that was not hypermetabolic. I did an ENB and EBUS on 01/21/2017. She now returns to discuss the results and plan therapy.  PATHOLOGY  Diagnosis Lung, biopsy, Left Upper Lobe - POORLY DIFFERENTIATED NON-SMALL CELL CARCINOMA. Microscopic Comment There is likely sufficient tissue for additional studies. Immunohistochemistry will be performed and reported as an addendum. Dr. Tresa Moore agrees. (JDP:ah 01/24/17) ADDENDUM: The tumor is strongly positive with cytokeratin 5/6 and negative with TTF-1, most consistent with squamous cell carcinoma. (JDP:ah 01/25/17) Claudette Laws MD Pathologist, Electronic Signature  I informed Mrs. Vinje and her family of the path results. 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 The mediastinal nodes were negative. Her stage at this point is IA, although I cannot completely rule the possibility of visceral or parietal pleural involvement.   The primary options for treatment are surgery and radiation. She may be a candidate for SBRT. I had a long discussion re: the advantages and disadvantages of each approach. She understands surgery(anatomic resection) provides a better chance of cure but there are risks associated with it. She is nearly 75 and has CAD, so the risks are not insignificant, but I do not feel they are prohibitive. They understand the risks include but are not limited to death, MI, stroke, DVT, PE, bleeding, possible need for transfusion, infection, cardiac arrhythmias, prolonged air leak, respiratory failure, renal failure,  or GI complications. They understand the degree of pain is variable and unpredictable.  She wishes to talk with Radiation Oncology prior to making a decision. I will arrange for a consultation.   If she wants to proceed with surgery will need to clear with Dr. Doylene Canard.  I spent 20 minutes with Mrs Sardinha and her family during this visit  Revonda Standard. Roxan Hockey, MD [facility] [phone]

## 2017-02-03 NOTE — Telephone Encounter (Signed)
Oncology Nurse Navigator Documentation  Oncology Nurse Navigator Flowsheets 02/03/2017  Navigator Location [facility]  Navigator Encounter Type Telephone/per Dr. Roxan Hockey, I called patient and scheduled her to come to clinic today.  She verbalized understanding of appt time and place.   Telephone Outgoing Call  [facility] Date 02/03/2017  Treatment Phase Pre-Tx/Tx Discussion  Barriers/Navigation Needs Coordination of Care  Interventions Coordination of Care  Coordination of Care Appts  Acuity Level 1  Time Spent with Patient 30

## 2017-02-08 ENCOUNTER — Encounter: Payer: Self-pay | Admitting: *Deleted

## 2017-02-08 ENCOUNTER — Encounter: Payer: Self-pay | Admitting: Radiation Oncology

## 2017-02-08 DIAGNOSIS — R911 Solitary pulmonary nodule: Secondary | ICD-10-CM

## 2017-02-08 NOTE — Progress Notes (Signed)
Oncology Nurse Navigator Documentation  Oncology Nurse Navigator Flowsheets 02/08/2017  Navigator Location [facility]  Navigator Encounter Type Other/Rad Onc referral completed.  Will update Rad Onc scheduling team.   Barriers/Navigation Needs Coordination of Care  Interventions Coordination of Care  Coordination of Care Other  Acuity Level 1  Time Spent with Patient 15

## 2017-02-15 NOTE — Progress Notes (Signed)
Thoracic Location of Tumor / Histology: left upper lobe of lung  Patient presented back in May 2018 with right sided anterior chest wall pain for about 2 weeks, productive cough and was treated for possible pneumonia with empiric anitbiotics.  CT Chest on 12/30/2016 revealed: Spiculated left upper lobe pulmonary nodule, consistent with primary bronchogenic carcinoma.  PET on 01/12/2017 revealed: Solitary, spiculated hypermetabolic left upper lobe nodule without evidence metastatic disease (most consistent with stage IA disease -T1aN0M0).  Biopsies of Left Upper Lobe of Lung on 01/21/2017 revealed:     Tobacco/Marijuana/Snuff/ETOH use: yes, Daily smoker. Explains she is smoking two cigarettes per day down from a 1/2 of a pack.  Past/Anticipated interventions by cardiothoracic surgery, if any:  01/21/2017 Procedure: VIDEO BRONCHOSCOPY WITH ENDOBRONCHIAL NAVIGATION;  Surgeon: Melrose Nakayama, MD;  Location: [place];  Service: Thoracic;  Laterality: N/A; Procedure: VIDEO BRONCHOSCOPY WITH ENDOBRONCHIAL ULTRASOUND;  Surgeon: Melrose Nakayama, MD;  Location: [facility];  Service: Thoracic;  Laterality: N/A;  Past/Anticipated interventions by medical oncology, if any: no  Signs/Symptoms  Weight changes, if any: no  Respiratory complaints, if any: Productive cough with clear sputum which is less frequent.  Hemoptysis, if any: no  Pain issues, if any:  no  SAFETY ISSUES:  Prior radiation? no  Pacemaker/ICD? no   Possible current pregnancy?no  Is the patient on methotrexate? no  Current Complaints / other details:  [name] [name] lives at home with son.  Pertinent medical history: DM2, HTN, CVA, Dyspnea, CAD, Arthritis. Reports sniffling and sneezing often. Denies pain or difficulty associated with swallowing. Reports a normal appetite.

## 2017-02-16 ENCOUNTER — Ambulatory Visit
Admission: RE | Admit: 2017-02-16 | Discharge: 2017-02-16 | Disposition: A | Discharge status: Home / Self Care | Payer: Medicare Other | Source: Ambulatory Visit | Attending: Radiation Oncology | Admitting: Radiation Oncology

## 2017-02-16 ENCOUNTER — Encounter: Payer: Self-pay | Admitting: Radiation Oncology

## 2017-02-16 VITALS — BP 161/57 | HR 62 | Temp 97.7°F | Resp 18 | Ht 69.0 in | Wt 197.4 lb

## 2017-02-16 DIAGNOSIS — Z833 Family history of diabetes mellitus: Secondary | ICD-10-CM | POA: Insufficient documentation

## 2017-02-16 DIAGNOSIS — H269 Unspecified cataract: Secondary | ICD-10-CM | POA: Diagnosis not present

## 2017-02-16 DIAGNOSIS — Z7982 Long term (current) use of aspirin: Secondary | ICD-10-CM | POA: Diagnosis not present

## 2017-02-16 DIAGNOSIS — Z880 Allergy status to penicillin: Secondary | ICD-10-CM | POA: Insufficient documentation

## 2017-02-16 DIAGNOSIS — M1990 Unspecified osteoarthritis, unspecified site: Secondary | ICD-10-CM | POA: Insufficient documentation

## 2017-02-16 DIAGNOSIS — Z79899 Other long term (current) drug therapy: Secondary | ICD-10-CM | POA: Diagnosis not present

## 2017-02-16 DIAGNOSIS — I2510 Atherosclerotic heart disease of native coronary artery without angina pectoris: Secondary | ICD-10-CM | POA: Insufficient documentation

## 2017-02-16 DIAGNOSIS — Z7984 Long term (current) use of oral hypoglycemic drugs: Secondary | ICD-10-CM | POA: Diagnosis not present

## 2017-02-16 DIAGNOSIS — Z8673 Personal history of transient ischemic attack (TIA), and cerebral infarction without residual deficits: Secondary | ICD-10-CM | POA: Diagnosis not present

## 2017-02-16 DIAGNOSIS — R590 Localized enlarged lymph nodes: Secondary | ICD-10-CM | POA: Insufficient documentation

## 2017-02-16 DIAGNOSIS — Z885 Allergy status to narcotic agent status: Secondary | ICD-10-CM | POA: Insufficient documentation

## 2017-02-16 DIAGNOSIS — Z8701 Personal history of pneumonia (recurrent): Secondary | ICD-10-CM | POA: Diagnosis not present

## 2017-02-16 DIAGNOSIS — I10 Essential (primary) hypertension: Secondary | ICD-10-CM | POA: Diagnosis not present

## 2017-02-16 DIAGNOSIS — C3412 Malignant neoplasm of upper lobe, left bronchus or lung: Secondary | ICD-10-CM | POA: Insufficient documentation

## 2017-02-16 DIAGNOSIS — F17210 Nicotine dependence, cigarettes, uncomplicated: Secondary | ICD-10-CM | POA: Diagnosis not present

## 2017-02-16 DIAGNOSIS — E119 Type 2 diabetes mellitus without complications: Secondary | ICD-10-CM | POA: Diagnosis not present

## 2017-02-16 HISTORY — DX: Malignant neoplasm of unspecified part of unspecified bronchus or lung: C34.90

## 2017-02-16 NOTE — Progress Notes (Signed)
See progress note under physician encounter. 

## 2017-02-16 NOTE — Addendum Note (Signed)
Encounter addended by: Heywood Footman, RN on: 02/16/2017  2:59 PM<BR>    Actions taken: Visit diagnoses modified

## 2017-02-16 NOTE — Progress Notes (Signed)
Radiation Oncology         [phone] ________________________________  Initial outpatient Consultation  Name: [name] [name] MRN: [id number]  Date: 02/16/2017  DOB: 08-Dec-1934  [id number], Joelene Millin, MD  Melrose Nakayama, *   REFERRING PHYSICIAN: Melrose Nakayama, *  DIAGNOSIS: The encounter diagnosis was 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 ([place]).    ICD-10-CM   1. 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 ([facility]) C34.12     HISTORY OF PRESENT ILLNESS: [name] [name] is a 82 y.o. female seen at the request of Dr. Roxan Hockey. The patient presented in May 2018 with right sided anterior chest wall pain for about 2 weeks, and shortness of breath, and was treated for possible pneumonia with empiric antibiotics. Chest x-ray at that time showed possible developing density in the left upper lobe and CT chest was recommended to exclude lung mass. The patient then presented to the ED on 12/29/2016 complaining of shortness of breath for 1 week. At that time CT Chest was performed showing 1.8 x 1.7 cm spiculated left upper lobe pulmonary nodule, consistent with primary bronchogenic carcinoma. There was borderline to mild contralateral mediastinal adenopathy which could be reactive or represent nodal metastasis. Also seen was a 6 mm right upper lobe pulmonary nodule, nonspecific.  PET scan was performed for further evaluation on 01/12/2017 and showed a 18 x 18 mm solitary, spiculated hypermetabolic left upper lobe nodule, SUV max 15.1, without evidence of metastatic disease and no other suspicious metabolic activity.   Patient underwent an ENB and EBUS on 01/21/2017 with Dr. Roxan Hockey. Pathology of the left upper lobe lesion revealed squamous cell carcinoma. Fine needle aspiration of several mediastinal nodes showed 0 of 5 specimens with malignant cells identified.  She has met with Dr. Roxan Hockey to discuss treatment options and is felt to be a good  candidate for surgical excision.  Prior to making her final decision, she wished to learn more about the option for radiotherapy.   The patient is here today, with her 3 daughters, to discuss radiation treatment options in the management of her disease.   PREVIOUS RADIATION THERAPY: No  PAST MEDICAL HISTORY:  Past Medical History:  Diagnosis Date  . Anginal pain ([place])   . Arthritis   . Coronary artery disease   . Diabetes mellitus   . Dyspnea   . Early cataracts, bilateral   . Headache   . Hypertension   . Lung cancer ([place])   . Mass of upper lobe of left lung    mediastinal adenopathy  . Palpitations   . Pneumonia   . Stroke ([place])   . Wears dentures   . Wears glasses       PAST SURGICAL HISTORY: Past Surgical History:  Procedure Laterality Date  . ABDOMINAL HYSTERECTOMY    . ANKLE SURGERY    . COLONOSCOPY W/ BIOPSIES AND POLYPECTOMY    . MULTIPLE TOOTH EXTRACTIONS    . TOTAL KNEE ARTHROPLASTY    . VIDEO BRONCHOSCOPY WITH ENDOBRONCHIAL NAVIGATION N/A 01/21/2017   Procedure: VIDEO BRONCHOSCOPY WITH ENDOBRONCHIAL NAVIGATION;  Surgeon: Melrose Nakayama, MD;  Location: [facility];  Service: Thoracic;  Laterality: N/A;  . VIDEO BRONCHOSCOPY WITH ENDOBRONCHIAL ULTRASOUND N/A 01/21/2017   Procedure: VIDEO BRONCHOSCOPY WITH ENDOBRONCHIAL ULTRASOUND;  Surgeon: Melrose Nakayama, MD;  Location[facility];  Service: Thoracic;  Laterality: N/A;    FAMILY HISTORY:  Family History  Problem Relation Age of Onset  . Diabetes Mother   .  Diabetes Sister   . Hyperlipidemia Sister   . Diabetes Brother   . Diabetes Son   . Cancer Neg Hx     SOCIAL HISTORY:  Social History   Social History  . Marital status: Widowed    Spouse name: N/A  . Number of children: N/A  . Years of education: N/A   Occupational History  . Not on file.   Social History Main Topics  . Smoking status: Current Some Day Smoker    Packs/day: 0.25    Years: 12.00    Types: Cigarettes  . Smokeless  tobacco: Never Used  . Alcohol use No  . Drug use: No  . Sexual activity: Not Currently   Other Topics Concern  . Not on file   Social History Narrative  . No narrative on file    ALLERGIES: Codeine and Penicillins  MEDICATIONS:  Current Outpatient Prescriptions  Medication Sig Dispense Refill  . amLODipine (NORVASC) 10 MG tablet Take 10 mg by mouth daily.      Marland Kitchen aspirin EC 81 MG tablet Take 81 mg by mouth daily.      Marland Kitchen atorvastatin (LIPITOR) 10 MG tablet Take 10 mg by mouth daily.  5  . benzonatate (TESSALON) 100 MG capsule Take 100-200 mg by mouth 3 (three) times daily as needed for cough.    . Cholecalciferol (VITAMIN D3 PO) Take 1 tablet by mouth daily.    . hydrALAZINE (APRESOLINE) 25 MG tablet Take 25 mg by mouth 2 (two) times daily.     . isosorbide dinitrate (ISORDIL) 20 MG tablet Take 20 mg by mouth 2 (two) times daily.     . meclizine (ANTIVERT) 25 MG tablet Take 25 mg by mouth 2 (two) times daily as needed for dizziness.    . metFORMIN (GLUCOPHAGE) 500 MG tablet Take 500 mg by mouth 2 (two) times daily.     . metoprolol succinate (TOPROL-XL) 25 MG 24 hr tablet Take 25 mg by mouth every evening.     . nitroGLYCERIN (NITROSTAT) 0.4 MG SL tablet Place 0.4 mg under the tongue every 5 (five) minutes as needed for chest pain.    . pioglitazone (ACTOS) 30 MG tablet Take 30 mg by mouth every evening.     . valsartan (DIOVAN) 160 MG tablet Take 160 mg by mouth daily.     No current facility-administered medications for this encounter.     REVIEW OF SYSTEMS:  On review of systems, the patient reports that she is doing well overall. She denies any chest pain, shortness of breath, fevers, chills, night sweats, unintended weight changes. She reports productive cough with clear sputum which is less frequent. She denies hemoptysis. She denies pain or difficulty with swallowing. She reports normal appetite and denies abdominal pain, nausea or vomiting. She reports sniffing and sneezing  often due to allergies. She denies any bowel or bladder disturbances.  She denies any new musculoskeletal or joint aches or pains. She reports occasional ankle swelling, further stating her PCP said she has gout. She reports angina. She does not have a pacemaker or stents in place. A complete review of systems is obtained and is otherwise negative.     PHYSICAL EXAM:  Wt Readings from Last 3 Encounters:  02/16/17 197 lb 6 oz (89.5 kg)  01/21/17 193 lb (87.5 kg)  01/19/17 193 lb 12.8 oz (87.9 kg)   Temp Readings from Last 3 Encounters:  02/16/17 97.7 F (36.5 C) (Oral)  01/21/17 97.7 F (36.5 C)  01/19/17 97.6 F (36.4 C)   BP Readings from Last 3 Encounters:  02/16/17 (!) 161/57  01/21/17 (!) 118/52  01/19/17 (!) 118/55   Pulse Readings from Last 3 Encounters:  02/16/17 62  01/21/17 71  01/19/17 71   Pain Assessment Pain Score: 0-No pain/10  In general this is a well appearing african-american female in no acute distress. She is alert and oriented x4 and appropriate throughout the examination. HEENT reveals that the patient is normocephalic, atraumatic. EOMs are intact. PERRLA. Skin is intact without any evidence of gross lesions. Cardiovascular exam reveals a regular rate and rhythm, no clicks rubs or murmurs are auscultated. Chest is clear to auscultation bilaterally. Lymphatic assessment is performed and does not reveal any adenopathy in the cervical, supraclavicular, axillary, or inguinal chains. Abdomen has active bowel sounds in all quadrants and is intact. The abdomen is soft, non tender, non distended. Lower extremities are negative for pretibial pitting edema, deep calf tenderness, cyanosis or clubbing.   KPS = 90  100 - Normal; no complaints; no evidence of disease. 90   - Able to carry on normal activity; minor signs or symptoms of disease. 80   - Normal activity with effort; some signs or symptoms of disease. 26   - Cares for self; unable to carry on normal activity  or to do active work. 60   - Requires occasional assistance, but is able to care for most of his personal needs. 50   - Requires considerable assistance and frequent medical care. 22   - Disabled; requires special care and assistance. 15   - Severely disabled; hospital admission is indicated although death not imminent. 61   - Very sick; hospital admission necessary; active supportive treatment necessary. 10   - Moribund; fatal processes progressing rapidly. 0     - Dead  Karnofsky DA, Abelmann [place], Craver LS and Burchenal [facility] [phone]) The use of the nitrogen mustards in the palliative treatment of carcinoma: with particular reference to bronchogenic carcinoma Cancer 1 [id number]  LABORATORY DATA:  Lab Results  Component Value Date   WBC 5.6 01/19/2017   HGB 10.8 (L) 01/19/2017   HCT 34.3 (L) 01/19/2017   MCV 81.1 01/19/2017   PLT 290 01/19/2017   Lab Results  Component Value Date   NA 137 01/19/2017   K 4.5 01/19/2017   CL 109 01/19/2017   CO2 20 (L) 01/19/2017   Lab Results  Component Value Date   ALT 10 (L) 01/19/2017   AST 15 01/19/2017   ALKPHOS 52 01/19/2017   BILITOT 0.4 01/19/2017     RADIOGRAPHY: Dg Chest 2 View  Result Date: 01/19/2017 CLINICAL DATA:  Preop for video bronchoscopy. EXAM: CHEST  2 VIEW COMPARISON:  Radiographs of Dec 23, 2016.  CT scan of January 12, 2017. FINDINGS: Stable cardiomediastinal silhouette with mild central pulmonary vascular congestion. No pneumothorax or pleural effusion is noted. Stable interstitial densities are noted throughout both lungs most consistent with scarring. Stable ill-defined density is seen in left upper lobe concerning for possible neoplasm. Atherosclerosis of thoracic aorta is noted. Bony thorax is unremarkable. IMPRESSION: Stable diffuse interstitial densities throughout both lungs most consistent with scarring. Stable ill-defined density seen in left upper lobe concerning for possible neoplasm. Aortic atherosclerosis. Electronically  Signed   By: Marijo Conception, M.D.   On: 01/19/2017 12:18   Dg Chest Port 1 View  Result Date: 01/21/2017 CLINICAL DATA:  Post bronch EXAM: PORTABLE CHEST 1 VIEW COMPARISON:  CT chest dated  12/30/2016 FINDINGS: Nodular opacity in the lateral left upper lobe. No focal consolidation. No pleural effusion or pneumothorax. The heart is normal in size. IMPRESSION: No pneumothorax status post bronchoscopy. Nodular opacity in the lateral left upper lobe. Electronically Signed   By: Julian Hy M.D.   On: 01/21/2017 10:48   Dg C-arm Bronchoscopy  Result Date: 01/21/2017 C-ARM BRONCHOSCOPY: Fluoroscopy was utilized by the requesting physician.  No radiographic interpretation.    PFTs:  Recent Review Flowsheet Data    Spirometry Latest Ref Rng & Units 01/18/2017   FVC-%PRED-PRE % 71   FVC-%PRED-POST % 76   FEV1-PRE L 1.40   FEV1-%PRED-PRE % 68   FEV1-POST L 1.61   FEV1-%PRED-POST % 78         IMPRESSION/PLAN: 1. 82 y.o. woman with clinical Stage IA squamous cell carcinoma of the left upper lobe.  She may be a candidate for surgical resection versus SBRT.  Her advanced age, smoking history and emphysematous changes may be risk factors for surgery, however, her PFT's suggest good pulmonary function and she is otherwise active and may tolerate surgery well.  Today, we talked to the patient and family about the findings and work-up thus far.  We discussed the natural history of early stage non-small cell lung cancer and general treatment, highlighting the role of stereotactic body radiotherapy in the management.  We discussed the available radiation techniques, and focused on the details of logistics and delivery.  We reviewed the anticipated acute and late sequelae associated with radiation in this setting.  The patient was encouraged to ask questions that we answered to the best of our ability. At the end of our discussion, the patient is still undecided as to her treatment preference and would  like additional time for consideration and discussion with her family.  We will plan to follow up with her in 1 week to answer any further questions and assess her readiness to make a decision regarding treatment.  2. Tobacco use. The patient is a daily smoker, though she is down to two cigarettes per day from previous 1/2 ppd.  We instructed her to quit.     Nicholos Johns, PA-C    Tyler Pita, MD  [facility] Direct Dial: [phone]  Fax: [phone] [email or web address]  Skype  [email or web address]  This document serves as a record of services personally performed by Tyler Pita, MD and Freeman Caldron, PA-C. It was created on their behalf by Arlyce Harman, a trained medical scribe. The creation of this record is based on the scribe's personal observations and the provider's statements to them. This document has been checked and approved by the attending provider.

## 2017-02-17 NOTE — Addendum Note (Signed)
Encounter addended by: Heywood Footman, RN on: 02/17/2017  9:20 AM<BR>    Actions taken: Visit Navigator Flowsheet section accepted

## 2017-02-22 ENCOUNTER — Telehealth: Payer: Self-pay | Admitting: Urology

## 2017-02-22 NOTE — Telephone Encounter (Signed)
I called and left a VM for patient requesting that she return my call regarding her treatment preference.  At our last visit on 7/18, she was undecided on SBRT vs surgical resection.

## 2017-02-23 ENCOUNTER — Telehealth: Payer: Self-pay | Admitting: Urology

## 2017-02-23 NOTE — Telephone Encounter (Signed)
I spoke with Ms. Hunsucker  Today to follow up on her treatment preference.  She remains undecided between surgery vs SBRT but has an appointment with her cardiologist on 02/28/17 to obtain cardiac clearance and pending results of that appointment, will discuss her options with her family and make a decision.  She plans to follow up with me on Monday 03/07/17 with her decision.  If I have not heard from her by then, I will plan to follow up with her.  She is in agreement with this plan and expressed her gratitude for the follow up call today.   Nicholos Johns, PA-C

## 2017-03-09 ENCOUNTER — Telehealth: Payer: Self-pay | Admitting: Urology

## 2017-03-09 NOTE — Telephone Encounter (Signed)
LMOVM requesting patient return my call to confirm her final decision for treatment of her lung cancer.  She had recent cardiology appointment for cardiac clearance to see if she is a surgical candidate.  If she is, she is leaning towards surgery but was undecided at our last conversation.

## 2017-03-10 ENCOUNTER — Telehealth: Payer: Self-pay | Admitting: Radiation Oncology

## 2017-03-10 NOTE — Telephone Encounter (Signed)
Error

## 2017-03-15 ENCOUNTER — Telehealth: Payer: Self-pay | Admitting: Urology

## 2017-03-15 NOTE — Telephone Encounter (Signed)
I called and spoke with [name] [name] to follow up regarding her recent cardiology visit and treatment preference.  She reports that she was granted cardiac clearance to proceed with surgical resection of her lung cancer and plans to schedule this with Dr. Roxan Hockey in the near future.  I wished her all the best with her upcoming procedure and advised that we are happy to help in any way that we can.   [name] Johns, PA-C

## 2017-03-29 ENCOUNTER — Telehealth: Payer: Self-pay

## 2017-03-29 NOTE — Telephone Encounter (Signed)
LMOM for [name] [name] to call back to schedule appointment with Dr Roxan Hockey. Dr Doylene Canard sent over cardiac clearance.

## 2017-03-30 ENCOUNTER — Encounter: Payer: Self-pay | Admitting: Cardiovascular Disease

## 2017-04-12 ENCOUNTER — Encounter: Payer: Self-pay | Admitting: Thoracic Surgery (Cardiothoracic Vascular Surgery)

## 2017-04-12 ENCOUNTER — Ambulatory Visit (INDEPENDENT_AMBULATORY_CARE_PROVIDER_SITE_OTHER): Payer: Medicare Other | Admitting: Thoracic Surgery (Cardiothoracic Vascular Surgery)

## 2017-04-12 ENCOUNTER — Other Ambulatory Visit: Payer: Self-pay | Admitting: *Deleted

## 2017-04-12 VITALS — BP 150/74 | Resp 16 | Ht 69.0 in | Wt 199.4 lb

## 2017-04-12 DIAGNOSIS — D381 Neoplasm of uncertain behavior of trachea, bronchus and lung: Secondary | ICD-10-CM | POA: Diagnosis not present

## 2017-04-12 DIAGNOSIS — R911 Solitary pulmonary nodule: Secondary | ICD-10-CM

## 2017-04-12 NOTE — Progress Notes (Signed)
[address]       [place] [address]             [phone]       HPI: [name] [name] returns to discuss possible surgical resection of a left upper lobe squamous cell carcinoma.  She is a 82 year old woman with a history of tobacco abuse, coronary disease, stroke, diabetes, hypertension, and arthritis. She presented back in June with right anterior chest wall pain. A chest x-ray suggested a possible pneumonia. She was treated with empiric antibiotics. A CT showed a 1.7 cm spiculated nodule in the left upper lobe. There also was a borderline enlarged paratracheal node. She had a CT-guided biopsy which showed squamous cell carcinoma. I did bronchoscopy and endobronchial ultrasound in July. There was no evidence of involvement of mediastinal lymph nodes. She was seen in consultation by Dr. Tammi Klippel discuss possible stereotactic radiation. After thinking over options she wished to proceed with surgical resection. She needed cardiac clearance from Dr. Doylene Canard. That now has been confirmed.  She is been feeling well. She has not had any recent cough or hemoptysis. She denies any fevers, chills, or sweats. Her appetite is good. She denies any chest pain, pressure, or tightness.  Past Medical History:  Diagnosis Date  . Anginal pain ([place])   . Arthritis   . Coronary artery disease   . Diabetes mellitus   . Dyspnea   . Early cataracts, bilateral   . Headache   . Hypertension   . Lung cancer ([place])   . Mass of upper lobe of left lung    mediastinal adenopathy  . Palpitations   . Pneumonia   . Stroke ([place])   . Wears dentures   . Wears glasses      Current Outpatient Prescriptions  Medication Sig Dispense Refill  . amLODipine (NORVASC) 10 MG tablet Take 10 mg by mouth daily.      Marland Kitchen aspirin EC 81 MG tablet Take 81 mg by mouth daily.      Marland Kitchen atorvastatin (LIPITOR) 10 MG tablet Take 10 mg by mouth daily.  5  . benzonatate (TESSALON) 100 MG capsule Take 100-200 mg by mouth  3 (three) times daily as needed for cough.    . Cholecalciferol (VITAMIN D3 PO) Take 1 tablet by mouth daily.    . hydrALAZINE (APRESOLINE) 25 MG tablet Take 25 mg by mouth 2 (two) times daily.     . isosorbide dinitrate (ISORDIL) 20 MG tablet Take 20 mg by mouth 2 (two) times daily.     Marland Kitchen LOSARTAN POTASSIUM PO Take 1 tablet by mouth daily.    . meclizine (ANTIVERT) 25 MG tablet Take 25 mg by mouth 2 (two) times daily as needed for dizziness.    . metFORMIN (GLUCOPHAGE) 500 MG tablet Take 500 mg by mouth 2 (two) times daily.     . metoprolol succinate (TOPROL-XL) 25 MG 24 hr tablet Take 25 mg by mouth every evening.     . nitroGLYCERIN (NITROSTAT) 0.4 MG SL tablet Place 0.4 mg under the tongue every 5 (five) minutes as needed for chest pain.    . pioglitazone (ACTOS) 30 MG tablet Take 30 mg by mouth every evening.      No current facility-administered medications for this visit.     Physical Exam BP (!) 150/74 (BP Location: Right Arm, Patient Position: Sitting, Cuff Size: Large)   Resp 16   Ht 5\' 9"  (1.753 m)   Wt 199 lb  6.4 oz (90.4 kg)   SpO2 98% Comment: ON RA  BMI 29.38 kg/m  82 year old woman in no acute distress Alert and oriented 3 with no focal deficits No cervical or supraclavicular adenopathy Cardiac regular rate and rhythm normal S1 and S2 Lungs clear with equal breath sounds bilaterally Abdomen benign Extremities without clubbing cyanosis or edema   Diagnostic Tests: Pulmonary function testing FVC 1.87 (71%) FEV1 1.40 (68%) FEV1 1.61 (78%) post bronchodilator TLC 6.73 (116%) RV 5.35 (203%)  Impression: [name] [name] 82 year old woman with history of tobacco abuse who has a left upper lobe squamous cell carcinoma. There was some right paratracheal adenopathy on CT but this was negative on PET and EBUS aspirations showed no evidence of tumor. Findings are consistent with stage I disease.  I again discussed the options of surgery versus radiation. She understands  the relative advantages and disadvantages of both approaches.  We discussed left VATS for left upper lobectomy or possible lingular sparing left upper lobectomy (segmentectomy). I informed her the general nature of the procedure, the incisions to be used, the need for general anesthesia, use of the drainage tube postoperatively, the expected hospital stay, and the overall recovery. She understands that this is the best chance for cure but does not guarantee a cure. I informed her the indications, risks, benefits, and alternatives. She understands the risks include, but are not limited to death, MI, DVT, PE, bleeding, possible need for transfusion, infection, prolonged air leak, irregular heart rhythms, as well as the possibility of other unforeseeable complications.  She accepts the risks and wishes to proceed  Plan: Left VATS for left upper lobectomy or segmentectomy on Thursday, 04/21/2017  Melrose Nakayama, MD [facility] [phone]

## 2017-04-18 NOTE — Pre-Procedure Instructions (Signed)
DIANCA OWENSBY  04/18/2017      [place], [place] [address] Phone: [phone] Fax: [phone]  [facility] - [place], [place] - [address] AT [place] [address] Phone: [phone] Fax: [phone]  [place], [place] - [name] [place] [address] Phone: [phone] Fax: [place], [place] [address] [place] [address] [place] [address] Phone: [phone] Fax: [phone]    Your procedure is scheduled on April 21, 2017.  Report to [facility] Admitting at 05:30 A.M.  Call this number if you have problems the morning of surgery:  [phone]   Remember:  Do not eat food or drink liquids after midnight.   Take these medicines the morning of surgery with A SIP OF WATER:   Amlodipine (Norvasc) Hydralazine (Apresoline) Isosorbide dinitrate (Isordil) Metoprolol (Toprol-XL)--If not taken the night before  7 days prior to surgery STOP taking any Aleve, Naproxen, Ibuprofen, Motrin, Advil, Goody's, BC's, all herbal medications, fish oil, and all vitamins.     Do not wear jewelry, make-up or nail polish.  Do not wear lotions, powders, or perfumes, or deodorant.  Do not shave 48 hours prior to surgery.   Do not bring valuables to the hospital.   [facility] is not responsible for any belongings or valuables.  Contacts, dentures or bridgework may not be worn into surgery.  Leave your suitcase in the car.  After surgery it may be brought to your room.  For patients admitted to the hospital, discharge time will be determined by your treatment team.  Patients discharged the day of surgery will not be allowed to drive home.   Special instructions:   [facility]- Preparing For  Surgery  Before surgery, you can play an important role. Because skin is not sterile, your skin needs to be as free of germs as possible. You can reduce the number of germs on your skin by washing with CHG (chlorahexidine gluconate) Soap before surgery.  CHG is an antiseptic cleaner which kills germs and bonds with the skin to continue killing germs even after washing.  Please do not use if you have an allergy to CHG or antibacterial soaps. If your skin becomes reddened/irritated stop using the CHG.  Do not shave (including legs and underarms) for at least 48 hours prior to first CHG shower. It is OK to shave your face.  Please follow these instructions carefully.   1. Shower the NIGHT BEFORE SURGERY and the MORNING OF SURGERY with CHG.   2. If you chose to wash your hair, wash your hair first as usual with your normal shampoo.  3. After you shampoo, rinse your hair and body thoroughly to remove the shampoo.  4. Use CHG as you would any other liquid soap. You can apply CHG directly to the skin and wash gently with a scrungie or a clean washcloth.   5. Apply the CHG Soap to your body ONLY FROM THE NECK DOWN.  Do not use on open wounds or open sores. Avoid contact with your eyes, ears, mouth and genitals (private parts). Wash genitals (private parts) with your normal soap.  6. Wash thoroughly, paying special attention to the area where your surgery will be performed.  7. Thoroughly rinse your body with warm water from the neck down.  8. DO NOT shower/wash with your normal soap after using and rinsing off the CHG Soap.  9. Pat yourself dry with a CLEAN TOWEL.   10. Wear CLEAN PAJAMAS   11. Place CLEAN SHEETS on your bed the night of your first shower and DO NOT SLEEP WITH PETS.    Day of Surgery: Do not apply any deodorants/lotions. Please wear clean clothes to the hospital/surgery center.      Please read over the following fact sheets that you were given.

## 2017-04-19 ENCOUNTER — Encounter (HOSPITAL_COMMUNITY)
Admission: RE | Admit: 2017-04-19 | Discharge: 2017-04-19 | Disposition: A | Discharge status: Home / Self Care | Payer: Medicare Other | Source: Ambulatory Visit | Attending: Thoracic Surgery (Cardiothoracic Vascular Surgery) | Admitting: Thoracic Surgery (Cardiothoracic Vascular Surgery)

## 2017-04-19 ENCOUNTER — Encounter (HOSPITAL_COMMUNITY): Payer: Self-pay

## 2017-04-19 ENCOUNTER — Ambulatory Visit (HOSPITAL_COMMUNITY)
Admission: RE | Admit: 2017-04-19 | Discharge: 2017-04-19 | Disposition: A | Discharge status: Home / Self Care | Payer: Medicare Other | Source: Ambulatory Visit | Attending: Thoracic Surgery (Cardiothoracic Vascular Surgery) | Admitting: Thoracic Surgery (Cardiothoracic Vascular Surgery)

## 2017-04-19 DIAGNOSIS — Z01812 Encounter for preprocedural laboratory examination: Secondary | ICD-10-CM | POA: Insufficient documentation

## 2017-04-19 DIAGNOSIS — R001 Bradycardia, unspecified: Secondary | ICD-10-CM | POA: Insufficient documentation

## 2017-04-19 DIAGNOSIS — R911 Solitary pulmonary nodule: Secondary | ICD-10-CM

## 2017-04-19 DIAGNOSIS — Z01810 Encounter for preprocedural cardiovascular examination: Secondary | ICD-10-CM | POA: Insufficient documentation

## 2017-04-19 DIAGNOSIS — J8410 Pulmonary fibrosis, unspecified: Secondary | ICD-10-CM

## 2017-04-19 HISTORY — DX: Gastro-esophageal reflux disease without esophagitis: K21.9

## 2017-04-19 HISTORY — DX: Unspecified asthma, uncomplicated: J45.909

## 2017-04-19 LAB — TYPE AND SCREEN
ABO/RH(D): B POS
ANTIBODY SCREEN: NEGATIVE

## 2017-04-19 LAB — COMPREHENSIVE METABOLIC PANEL
ALT: 10 U/L — ABNORMAL LOW (ref 14–54)
AST: 15 U/L (ref 15–41)
Albumin: 4 g/dL (ref 3.5–5.0)
Alkaline Phosphatase: 44 U/L (ref 38–126)
Anion gap: 9 (ref 5–15)
BUN: 22 mg/dL — ABNORMAL HIGH (ref 6–20)
CHLORIDE: 110 mmol/L (ref 101–111)
CO2: 18 mmol/L — ABNORMAL LOW (ref 22–32)
Calcium: 9.3 mg/dL (ref 8.9–10.3)
Creatinine, Ser: 1.27 mg/dL — ABNORMAL HIGH (ref 0.44–1.00)
GFR, EST AFRICAN AMERICAN: 45 mL/min — AB (ref >60)
GFR, EST NON AFRICAN AMERICAN: 39 mL/min — AB (ref >60)
Glucose, Bld: 98 mg/dL (ref 65–99)
POTASSIUM: 4.4 mmol/L (ref 3.5–5.1)
Sodium: 137 mmol/L (ref 135–145)
TOTAL PROTEIN: 7.4 g/dL (ref 6.5–8.1)
Total Bilirubin: 0.7 mg/dL (ref 0.3–1.2)

## 2017-04-19 LAB — URINALYSIS, ROUTINE W REFLEX MICROSCOPIC
Bilirubin Urine: NEGATIVE
GLUCOSE, UA: NEGATIVE mg/dL
Hgb urine dipstick: NEGATIVE
Ketones, ur: NEGATIVE mg/dL
NITRITE: POSITIVE — AB
PROTEIN: NEGATIVE mg/dL
Specific Gravity, Urine: 1.016 (ref 1.005–1.030)
pH: 5 (ref 5.0–8.0)

## 2017-04-19 LAB — CBC
HEMATOCRIT: 33.5 % — AB (ref 36.0–46.0)
Hemoglobin: 10.8 g/dL — ABNORMAL LOW (ref 12.0–15.0)
MCH: 25.9 pg — AB (ref 26.0–34.0)
MCHC: 32.2 g/dL (ref 30.0–36.0)
MCV: 80.3 fL (ref 78.0–100.0)
PLATELETS: 253 10*3/uL (ref 150–400)
RBC: 4.17 MIL/uL (ref 3.87–5.11)
RDW: 14.9 % (ref 11.5–15.5)
WBC: 6.3 10*3/uL (ref 4.0–10.5)

## 2017-04-19 LAB — HEMOGLOBIN A1C
Hgb A1c MFr Bld: 6.1 % — ABNORMAL HIGH (ref 4.8–5.6)
Mean Plasma Glucose: 128.37 mg/dL

## 2017-04-19 LAB — PROTIME-INR
INR: 1.04
PROTHROMBIN TIME: 13.5 s (ref 11.4–15.2)

## 2017-04-19 LAB — GLUCOSE, CAPILLARY: Glucose-Capillary: 126 mg/dL — ABNORMAL HIGH (ref 65–99)

## 2017-04-19 LAB — APTT: aPTT: 29 seconds (ref 24–36)

## 2017-04-19 LAB — SURGICAL PCR SCREEN
MRSA, PCR: NEGATIVE
STAPHYLOCOCCUS AUREUS: NEGATIVE

## 2017-04-19 LAB — ABO/RH: ABO/RH(D): B POS

## 2017-04-19 NOTE — Pre-Procedure Instructions (Addendum)
[name] [name]  04/19/2017      [place], [place] [address] Phone: [phone] Fax: [phone]  [facility] - [place], [place] - [address] AT [place] [address] Phone: [phone] Fax: [phone]  [place], [place] - [address] [name] [name] [address] Phone: [phone] Fax: [place], [place] [address] [place] [address] [place] [address] Phone: [phone] Fax: [phone]    Your procedure is scheduled on April 21, 2017.  Report to [facility] Admitting at 05:30 A.M.  Call this number if you have problems the morning of surgery:  [phone]   Remember:  Do not eat food or drink liquids after midnight.   Take these medicines the morning of surgery with A SIP OF WATER:   Amlodipine (Norvasc) Hydralazine (Apresoline) Isosorbide dinitrate (Isordil) Metoprolol (Toprol-XL)--If not taken the night before Nitro if needed  7 days prior to surgery STOP today taking any Aleve, Naproxen, Ibuprofen, Motrin, Advil, Goody's, BC's, all herbal medications, fish oil, and all vitamins.  Stop aspirin per dr    -No metformin am of surgery     Do not wear jewelry, make-up or nail polish.  Do not wear lotions, powders, or perfumes, or deodorant.  Do not shave 48 hours prior to surgery.   Do not bring valuables to the hospital.   [facility] is not responsible for any belongings or valuables.  Contacts, dentures or bridgework may not be worn into surgery.  Leave your suitcase in the car.  After surgery it may be brought to your room.  For patients admitted to the hospital, discharge time will be determined by your treatment team.  Patients discharged the day of surgery will not be  allowed to drive home.   Special instructions:   [facility]- Preparing For Surgery  Before surgery, you can play an important role. Because skin is not sterile, your skin needs to be as free of germs as possible. You can reduce the number of germs on your skin by washing with CHG (chlorahexidine gluconate) Soap before surgery.  CHG is an antiseptic cleaner which kills germs and bonds with the skin to continue killing germs even after washing.  Please do not use if you have an allergy to CHG or antibacterial soaps. If your skin becomes reddened/irritated stop using the CHG.  Do not shave (including legs and underarms) for at least 48 hours prior to first CHG shower. It is OK to shave your face.  Please follow these instructions carefully.   1. Shower the NIGHT BEFORE SURGERY and the MORNING OF SURGERY with CHG.   2. If you chose to wash your hair, wash your hair first as usual with your normal shampoo.  3. After you shampoo, rinse your hair and body thoroughly to remove the shampoo.  4. Use CHG as you would any other liquid soap. You can apply CHG directly to the skin and wash gently with a scrungie or a clean washcloth.   5. Apply the CHG Soap to your body ONLY FROM THE NECK DOWN.  Do not use on open wounds or open sores. Avoid contact with your eyes, ears, mouth and genitals (private parts). Wash genitals (private parts) with your normal  soap.  6. Wash thoroughly, paying special attention to the area where your surgery will be performed.  7. Thoroughly rinse your body with warm water from the neck down.  8. DO NOT shower/wash with your normal soap after using and rinsing off the CHG Soap.  9. Pat yourself dry with a CLEAN TOWEL.   10. Wear CLEAN PAJAMAS   11. Place CLEAN SHEETS on your bed the night of your first shower and DO NOT SLEEP WITH PETS.    Day of Surgery: Do not apply any deodorants/lotions. Please wear clean clothes to the hospital/surgery center.      Please  read over the following fact sheets that you were given.

## 2017-04-20 ENCOUNTER — Telehealth: Payer: Self-pay

## 2017-04-20 DIAGNOSIS — N390 Urinary tract infection, site not specified: Secondary | ICD-10-CM

## 2017-04-20 MED ORDER — CIPROFLOXACIN HCL 500 MG PO TABS: 500.0000 mg | ORAL_TABLET | Freq: Once | ORAL | 0 refills | Status: AC

## 2017-04-20 MED ORDER — VANCOMYCIN HCL IN DEXTROSE 1-5 GM/200ML-% IV SOLN
1000.0000 mg | INTRAVENOUS | Status: AC
  Administered 2017-04-21: 1000 mg via INTRAVENOUS
  Filled 2017-04-20: qty 200

## 2017-04-20 NOTE — Anesthesia Preprocedure Evaluation (Addendum)
Anesthesia Evaluation  Patient identified by MRN, date of birth, ID band Patient awake    Reviewed: Allergy & Precautions, NPO status , Patient's Chart, lab work & pertinent test results, reviewed documented beta blocker date and time   Airway Mallampati: II  TM Distance: >3 FB Neck ROM: Full    Dental  (+) Poor Dentition   Pulmonary asthma , former smoker,  Lung CA   breath sounds clear to auscultation       Cardiovascular hypertension, Pt. on medications and Pt. on home beta blockers + CAD and + Peripheral Vascular Disease   Rhythm:Regular Rate:Normal  Echo 03/08/17 ([facility]):  Normal LV size and systolic function with EF 60%. Mild diastolic dysfunction. Normal RV size and systolic function. Normal aortic valve, mitral valve, pulmonary valve, and tricuspid valve. Normal LA and RA size. Doppler: Mild PI and trace TR.  Nuclear stress test 07/23/15: IMPRESSION: 1. Stable fixed anteroseptal defect consistent with myocardial scar. No evidence of stress-induced myocardial ischemia. 2. Normal left ventricular wall motion. 3. Left ventricular ejection fraction 55% 4. Low-risk stress test findings*.   Neuro/Psych CVA    GI/Hepatic Neg liver ROS, GERD  ,  Endo/Other  diabetes, Type 2, Oral Hypoglycemic Agents  Renal/GU Renal disease     Musculoskeletal  (+) Arthritis ,   Abdominal   Peds  Hematology  (+) anemia ,   Anesthesia Other Findings   Reproductive/Obstetrics                            Lab Results  Component Value Date   WBC 6.3 04/19/2017   HGB 10.8 (L) 04/19/2017   HCT 33.5 (L) 04/19/2017   MCV 80.3 04/19/2017   PLT 253 04/19/2017   Lab Results  Component Value Date   CREATININE 1.27 (H) 04/19/2017   BUN 22 (H) 04/19/2017   NA 137 04/19/2017   K 4.4 04/19/2017   CL 110 04/19/2017   CO2 18 (L) 04/19/2017    Anesthesia Physical Anesthesia Plan  ASA: III  Anesthesia  Plan: General   Post-op Pain Management:    Induction: Intravenous  PONV Risk Score and Plan: 4 or greater and Ondansetron, Dexamethasone, Midazolam and Treatment may vary due to age or medical condition  Airway Management Planned: Double Lumen EBT  Additional Equipment: Arterial line and CVP  Intra-op Plan:   Post-operative Plan: Possible Post-op intubation/ventilation  Informed Consent: I have reviewed the patients History and Physical, chart, labs and discussed the procedure including the risks, benefits and alternatives for the proposed anesthesia with the patient or authorized representative who has indicated his/her understanding and acceptance.   Dental advisory given  Plan Discussed with: CRNA  Anesthesia Plan Comments:        Anesthesia Quick Evaluation

## 2017-04-20 NOTE — Telephone Encounter (Signed)
RX for Cipro 500 mg po tonight #1, no refill called into [facility]. Patient notified

## 2017-04-20 NOTE — Progress Notes (Addendum)
Anesthesia Chart Review: Patient is a 82 year old female scheduled for left VATS, LU lobectomy or segmentectomy on 04/21/17 by Dr. Modesto Charon. She is s/p bronchoscopy with endobronchial ultrasound and endobronchial navigation on 01/21/17 that showed poorly differentiated non-small cell carcinoma.  History includes recent former smoker (quit 03/02/17), lung cancer (12/2016), HTN, DM2, CVA, CAD, chest pain (s/p LHC '01 mild CAD, s/p LHC '07 non-obstructive CAD; s/p stress test 07/2015 anteroseptal scar, no ischemia), palpitations, dyspnea, TKA, hysterectomy. Admission 12/29/16-12/31/16 by her cardiologist Dr. Doylene Canard for SOB, possible PNA. Chest CT was done showing spiculated LUL nodule worrisome for primary bronchogenic carcinoma. Pulmonology and CT surgery was consulted. Oncology consult was placed on hold until tissue diagnosis known. Out-patient PET scan, PFTs planned, and now the above procedure is recommended.  - PCP is Dr. Willey Blade. - Cardiologist is Dr. Dixie Dials. Last visit 03/08/17. He wrote, "May undergo pulmonary nodule investigation and surgery."  Meds include amlodipine, ASA 81 mg, Lipitor, hydralazine, Isordil, losartan, meclizine, metformin, Toprol-XL, nitroglycerin, Actos.  BP (!) 134/48   Pulse 62   Temp 36.7 C   Resp 18   Ht 5\' 9"  (1.753 m)   Wt 196 lb 8 oz (89.1 kg)   SpO2 100%   BMI 29.02 kg/m   EKG 04/19/17: SB at 51 bpm.   Echo 03/08/17 ([facility]):  Normal LV size and systolic function with EF 60%. Mild diastolic dysfunction. Normal RV size and systolic function. Normal aortic valve, mitral valve, pulmonary valve, and tricuspid valve. Normal LA and RA size. Doppler: Mild PI and trace TR.  Nuclear stress test 07/23/15: IMPRESSION: 1. Stable fixed anteroseptal defect consistent with myocardial scar. No evidence of stress-induced myocardial ischemia. 2. Normal left ventricular wall motion. 3. Left ventricular ejection fraction 55% 4. Low-risk stress  test findings*.  Cardiac cath 07/07/06:  1. The left main coronary artery was unremarkable. 2. Left anterior descending coronary artery. The left anterior descending coronary artery had proximal 20-30% eccentric lesion inthe proximal ectatic area and following that it had post diagonalorigin 20% lesion. The rest of the vessel was unremarkable and it wrapped around the apex of the heart supplying half of theposterior septum. The diagonal I vessel had mild ostial 20% concentric lesion. 3. Left circumflex coronary artery. The left circumflex coronary artery was essentially unremarkable with some luminal irregularities and the ramus branch had a proximal 30% eccentriclesion. Its obtuse marginal branches were small vessels. 4. Right coronary artery. The right coronary artery was codominant with left circumflex coronary artery. It had a proximal 50%concentric lesion and had luminal irregularities throughout the vessel with occasional 20-30% eccentric lesion. The posterolateral branch was unremarkable and posterior descendingcoronary artery was a small vessel. IMPRESSION: 1. Multivessel native vessel coronary artery disease. 2. Hypertensive heart disease. RECOMMENDATIONS: This patient will continue medical therapy with lifestyle modification. Addition of HMG coenzyme A reductase medicationlike lipitor and smoking cessation, decreasing salt intake, etc.  CXR 04/19/17: IMPRESSION: Persistent nodular opacity anterior segment left upper lobe, currently measuring 2.4 x 1.8 x 1.8 cm. Upper lobe fibrotic change, more severe on the right than on the left, stable. No edema or consolidation. Stable cardiac silhouette.  PFTs 01/18/17: FVC 1.87 (71%), FEV1 1.40 (68%). Effort was marginal at best. DLCO was attempted multiple times but without success.   Preoperative labs noted. Cr 1.27. Glucose 98. H/H 10.8/33.5. PT/PTT WNL. A1c 6.1 on 04/19/17. UA showed leukocytes and nitrites (UA results routed to  Dr. Roxan Hockey and T[facility] RN Thurmond Butts. UPDATE: Cipro prescribed.)  I will defer any treatment recommends for abnormal UA to surgeon. WBC is normal. She has recent cardiology evaluation with clearance. If not acutely ill and remains asymptomatic from a CV standpoint then I would anticipate that she can proceed as planned.   George Hugh [facility] Short Stay Center/Anesthesiology Phone [phone] 04/20/2017 12:14 PM

## 2017-04-21 ENCOUNTER — Inpatient Hospital Stay (HOSPITAL_COMMUNITY): Payer: Medicare Other | Admitting: Emergency Medicine

## 2017-04-21 ENCOUNTER — Inpatient Hospital Stay (HOSPITAL_COMMUNITY): Payer: Medicare Other

## 2017-04-21 ENCOUNTER — Inpatient Hospital Stay (HOSPITAL_COMMUNITY)
Admission: RE | Admit: 2017-04-21 | Discharge: 2017-04-25 | DRG: 164 | Disposition: A | Discharge status: Home / Self Care | Payer: Medicare Other | Source: Ambulatory Visit | Attending: Thoracic Surgery (Cardiothoracic Vascular Surgery) | Admitting: Thoracic Surgery (Cardiothoracic Vascular Surgery)

## 2017-04-21 ENCOUNTER — Encounter (HOSPITAL_COMMUNITY)
Admission: RE | Disposition: A | Discharge status: Home / Self Care | Payer: Self-pay | Source: Ambulatory Visit | Attending: Thoracic Surgery (Cardiothoracic Vascular Surgery)

## 2017-04-21 ENCOUNTER — Encounter (HOSPITAL_COMMUNITY): Payer: Self-pay

## 2017-04-21 ENCOUNTER — Inpatient Hospital Stay (HOSPITAL_COMMUNITY): Payer: Medicare Other | Admitting: Vascular Surgery

## 2017-04-21 DIAGNOSIS — H269 Unspecified cataract: Secondary | ICD-10-CM | POA: Diagnosis present

## 2017-04-21 DIAGNOSIS — Z79899 Other long term (current) drug therapy: Secondary | ICD-10-CM

## 2017-04-21 DIAGNOSIS — Z880 Allergy status to penicillin: Secondary | ICD-10-CM

## 2017-04-21 DIAGNOSIS — R001 Bradycardia, unspecified: Secondary | ICD-10-CM | POA: Diagnosis present

## 2017-04-21 DIAGNOSIS — Z87891 Personal history of nicotine dependence: Secondary | ICD-10-CM | POA: Diagnosis not present

## 2017-04-21 DIAGNOSIS — J939 Pneumothorax, unspecified: Secondary | ICD-10-CM | POA: Diagnosis not present

## 2017-04-21 DIAGNOSIS — M1990 Unspecified osteoarthritis, unspecified site: Secondary | ICD-10-CM | POA: Diagnosis present

## 2017-04-21 DIAGNOSIS — J9811 Atelectasis: Secondary | ICD-10-CM | POA: Diagnosis not present

## 2017-04-21 DIAGNOSIS — J8410 Pulmonary fibrosis, unspecified: Secondary | ICD-10-CM | POA: Diagnosis present

## 2017-04-21 DIAGNOSIS — Z7982 Long term (current) use of aspirin: Secondary | ICD-10-CM | POA: Diagnosis not present

## 2017-04-21 DIAGNOSIS — Z8673 Personal history of transient ischemic attack (TIA), and cerebral infarction without residual deficits: Secondary | ICD-10-CM

## 2017-04-21 DIAGNOSIS — R911 Solitary pulmonary nodule: Secondary | ICD-10-CM | POA: Diagnosis present

## 2017-04-21 DIAGNOSIS — C3412 Malignant neoplasm of upper lobe, left bronchus or lung: Principal | ICD-10-CM | POA: Diagnosis present

## 2017-04-21 DIAGNOSIS — Z7984 Long term (current) use of oral hypoglycemic drugs: Secondary | ICD-10-CM

## 2017-04-21 DIAGNOSIS — E1142 Type 2 diabetes mellitus with diabetic polyneuropathy: Secondary | ICD-10-CM | POA: Diagnosis present

## 2017-04-21 DIAGNOSIS — I2510 Atherosclerotic heart disease of native coronary artery without angina pectoris: Secondary | ICD-10-CM | POA: Diagnosis present

## 2017-04-21 DIAGNOSIS — I10 Essential (primary) hypertension: Secondary | ICD-10-CM | POA: Diagnosis present

## 2017-04-21 DIAGNOSIS — Z01812 Encounter for preprocedural laboratory examination: Secondary | ICD-10-CM | POA: Diagnosis not present

## 2017-04-21 DIAGNOSIS — E876 Hypokalemia: Secondary | ICD-10-CM | POA: Diagnosis present

## 2017-04-21 DIAGNOSIS — R079 Chest pain, unspecified: Secondary | ICD-10-CM | POA: Diagnosis present

## 2017-04-21 DIAGNOSIS — Z01810 Encounter for preprocedural cardiovascular examination: Secondary | ICD-10-CM

## 2017-04-21 DIAGNOSIS — Z902 Acquired absence of lung [part of]: Secondary | ICD-10-CM

## 2017-04-21 DIAGNOSIS — Z09 Encounter for follow-up examination after completed treatment for conditions other than malignant neoplasm: Secondary | ICD-10-CM

## 2017-04-21 HISTORY — PX: VIDEO ASSISTED THORACOSCOPY (VATS)/ LOBECTOMY: SHX6169

## 2017-04-21 LAB — GLUCOSE, CAPILLARY
GLUCOSE-CAPILLARY: 118 mg/dL — AB (ref 65–99)
GLUCOSE-CAPILLARY: 122 mg/dL — AB (ref 65–99)
GLUCOSE-CAPILLARY: 156 mg/dL — AB (ref 65–99)
GLUCOSE-CAPILLARY: 174 mg/dL — AB (ref 65–99)
Glucose-Capillary: 189 mg/dL — ABNORMAL HIGH (ref 65–99)

## 2017-04-21 SURGERY — VIDEO ASSISTED THORACOSCOPY (VATS)/ LOBECTOMY
Anesthesia: General | Site: Chest | Laterality: Left

## 2017-04-21 MED ORDER — ISOSORBIDE DINITRATE 20 MG PO TABS
20.0000 mg | ORAL_TABLET | Freq: Two times a day (BID) | ORAL | Status: DC
  Administered 2017-04-22 – 2017-04-25 (×7): 20 mg via ORAL
  Filled 2017-04-21: qty 1
  Filled 2017-04-21: qty 2
  Filled 2017-04-21 (×5): qty 1

## 2017-04-21 MED ORDER — LEVALBUTEROL HCL 0.63 MG/3ML IN NEBU
0.6300 mg | INHALATION_SOLUTION | Freq: Four times a day (QID) | RESPIRATORY_TRACT | Status: DC
  Administered 2017-04-21 – 2017-04-22 (×3): 0.63 mg via RESPIRATORY_TRACT
  Filled 2017-04-21 (×3): qty 3

## 2017-04-21 MED ORDER — LOSARTAN POTASSIUM 50 MG PO TABS
50.0000 mg | ORAL_TABLET | Freq: Every day | ORAL | Status: DC
  Administered 2017-04-22 – 2017-04-25 (×4): 50 mg via ORAL
  Filled 2017-04-21 (×4): qty 1

## 2017-04-21 MED ORDER — HYDROMORPHONE HCL 1 MG/ML IJ SOLN
0.2500 mg | INTRAMUSCULAR | Status: DC | PRN
  Administered 2017-04-21 (×4): 0.5 mg via INTRAVENOUS

## 2017-04-21 MED ORDER — SUGAMMADEX SODIUM 500 MG/5ML IV SOLN
INTRAVENOUS | Status: AC
  Filled 2017-04-21: qty 5

## 2017-04-21 MED ORDER — BUPIVACAINE ON-Q PAIN PUMP (FOR ORDER SET NO CHG)
INJECTION | Status: DC
  Filled 2017-04-21: qty 1

## 2017-04-21 MED ORDER — BUPIVACAINE HCL (PF) 0.5 % IJ SOLN
INTRAMUSCULAR | Status: AC
  Filled 2017-04-21: qty 10

## 2017-04-21 MED ORDER — ONDANSETRON HCL 4 MG/2ML IJ SOLN
INTRAMUSCULAR | Status: AC
  Filled 2017-04-21: qty 2

## 2017-04-21 MED ORDER — MIDAZOLAM HCL 2 MG/2ML IJ SOLN
INTRAMUSCULAR | Status: AC
  Filled 2017-04-21: qty 2

## 2017-04-21 MED ORDER — BUPIVACAINE 0.5 % ON-Q PUMP SINGLE CATH 400 ML
400.0000 mL | INJECTION | Status: DC
  Filled 2017-04-21: qty 400

## 2017-04-21 MED ORDER — FENTANYL CITRATE (PF) 250 MCG/5ML IJ SOLN
INTRAMUSCULAR | Status: AC
  Filled 2017-04-21: qty 5

## 2017-04-21 MED ORDER — ASPIRIN EC 81 MG PO TBEC
81.0000 mg | DELAYED_RELEASE_TABLET | Freq: Every day | ORAL | Status: DC
  Administered 2017-04-22 – 2017-04-25 (×4): 81 mg via ORAL
  Filled 2017-04-21 (×4): qty 1

## 2017-04-21 MED ORDER — ROCURONIUM BROMIDE 10 MG/ML (PF) SYRINGE
PREFILLED_SYRINGE | INTRAVENOUS | Status: AC
  Filled 2017-04-21: qty 5

## 2017-04-21 MED ORDER — PROMETHAZINE HCL 25 MG/ML IJ SOLN: 6.2500 mg | INTRAMUSCULAR | Status: DC | PRN

## 2017-04-21 MED ORDER — MIDAZOLAM HCL 5 MG/5ML IJ SOLN
INTRAMUSCULAR | Status: DC | PRN
  Administered 2017-04-21 (×2): 1 mg via INTRAVENOUS

## 2017-04-21 MED ORDER — ROCURONIUM BROMIDE 100 MG/10ML IV SOLN
INTRAVENOUS | Status: DC | PRN
  Administered 2017-04-21 (×2): 50 mg via INTRAVENOUS

## 2017-04-21 MED ORDER — VANCOMYCIN HCL IN DEXTROSE 1-5 GM/200ML-% IV SOLN
1000.0000 mg | Freq: Two times a day (BID) | INTRAVENOUS | Status: AC
  Administered 2017-04-21: 1000 mg via INTRAVENOUS
  Filled 2017-04-21: qty 200

## 2017-04-21 MED ORDER — ACETAMINOPHEN 500 MG PO TABS
1000.0000 mg | ORAL_TABLET | Freq: Four times a day (QID) | ORAL | Status: DC
  Administered 2017-04-22 – 2017-04-25 (×11): 1000 mg via ORAL
  Filled 2017-04-21 (×14): qty 2

## 2017-04-21 MED ORDER — NALOXONE HCL 0.4 MG/ML IJ SOLN: 0.4000 mg | INTRAMUSCULAR | Status: DC | PRN

## 2017-04-21 MED ORDER — ATORVASTATIN CALCIUM 10 MG PO TABS
10.0000 mg | ORAL_TABLET | Freq: Every day | ORAL | Status: DC
  Administered 2017-04-22 – 2017-04-25 (×4): 10 mg via ORAL
  Filled 2017-04-21 (×4): qty 1

## 2017-04-21 MED ORDER — BISACODYL 5 MG PO TBEC
10.0000 mg | DELAYED_RELEASE_TABLET | Freq: Every day | ORAL | Status: DC
  Administered 2017-04-22 – 2017-04-25 (×4): 10 mg via ORAL
  Filled 2017-04-21 (×4): qty 2

## 2017-04-21 MED ORDER — BUPIVACAINE 0.5 % ON-Q PUMP SINGLE CATH 400 ML
INJECTION | Status: AC | PRN
  Administered 2017-04-21: 400 mL

## 2017-04-21 MED ORDER — ONDANSETRON HCL 4 MG/2ML IJ SOLN: 4.0000 mg | Freq: Four times a day (QID) | INTRAMUSCULAR | Status: DC | PRN

## 2017-04-21 MED ORDER — 0.9 % SODIUM CHLORIDE (POUR BTL) OPTIME
TOPICAL | Status: DC | PRN
  Administered 2017-04-21 (×2): 1000 mL

## 2017-04-21 MED ORDER — HYDRALAZINE HCL 25 MG PO TABS
25.0000 mg | ORAL_TABLET | Freq: Two times a day (BID) | ORAL | Status: DC
  Administered 2017-04-22 – 2017-04-25 (×7): 25 mg via ORAL
  Filled 2017-04-21 (×7): qty 1

## 2017-04-21 MED ORDER — SUGAMMADEX SODIUM 500 MG/5ML IV SOLN
INTRAVENOUS | Status: DC | PRN
  Administered 2017-04-21: 360 mg via INTRAVENOUS

## 2017-04-21 MED ORDER — ONDANSETRON HCL 4 MG/2ML IJ SOLN
4.0000 mg | Freq: Four times a day (QID) | INTRAMUSCULAR | Status: DC | PRN
  Administered 2017-04-21: 4 mg via INTRAVENOUS
  Filled 2017-04-21: qty 2

## 2017-04-21 MED ORDER — EPHEDRINE SULFATE 50 MG/ML IJ SOLN
INTRAMUSCULAR | Status: DC | PRN
  Administered 2017-04-21 (×2): 5 mg via INTRAVENOUS

## 2017-04-21 MED ORDER — PANTOPRAZOLE SODIUM 40 MG PO TBEC
40.0000 mg | DELAYED_RELEASE_TABLET | Freq: Every day | ORAL | Status: DC
  Administered 2017-04-21 – 2017-04-25 (×5): 40 mg via ORAL
  Filled 2017-04-21 (×4): qty 1

## 2017-04-21 MED ORDER — LIDOCAINE 2% (20 MG/ML) 5 ML SYRINGE
INTRAMUSCULAR | Status: AC
  Filled 2017-04-21: qty 5

## 2017-04-21 MED ORDER — DIPHENHYDRAMINE HCL 50 MG/ML IJ SOLN
12.5000 mg | Freq: Four times a day (QID) | INTRAMUSCULAR | Status: DC | PRN
  Administered 2017-04-24: 12.5 mg via INTRAVENOUS
  Filled 2017-04-21: qty 1

## 2017-04-21 MED ORDER — AMLODIPINE BESYLATE 10 MG PO TABS
10.0000 mg | ORAL_TABLET | Freq: Every day | ORAL | Status: DC
  Administered 2017-04-22 – 2017-04-25 (×4): 10 mg via ORAL
  Filled 2017-04-21 (×4): qty 1

## 2017-04-21 MED ORDER — DEXTROSE-NACL 5-0.9 % IV SOLN
INTRAVENOUS | Status: DC
  Administered 2017-04-21 – 2017-04-24 (×5): via INTRAVENOUS

## 2017-04-21 MED ORDER — METOPROLOL SUCCINATE ER 25 MG PO TB24
25.0000 mg | ORAL_TABLET | Freq: Every evening | ORAL | Status: DC
  Administered 2017-04-22 – 2017-04-24 (×3): 25 mg via ORAL
  Filled 2017-04-21 (×4): qty 1

## 2017-04-21 MED ORDER — INSULIN ASPART 100 UNIT/ML ~~LOC~~ SOLN
0.0000 [IU] | SUBCUTANEOUS | Status: DC
  Administered 2017-04-21 (×2): 4 [IU] via SUBCUTANEOUS
  Administered 2017-04-22: 2 [IU] via SUBCUTANEOUS
  Administered 2017-04-22: 4 [IU] via SUBCUTANEOUS

## 2017-04-21 MED ORDER — METFORMIN HCL 500 MG PO TABS: 500.0000 mg | ORAL_TABLET | Freq: Two times a day (BID) | ORAL | Status: DC

## 2017-04-21 MED ORDER — FENTANYL 40 MCG/ML IV SOLN
INTRAVENOUS | Status: DC
  Administered 2017-04-21: 1000 ug via INTRAVENOUS
  Administered 2017-04-21: 40 ug via INTRAVENOUS
  Administered 2017-04-22: 120 ug via INTRAVENOUS
  Administered 2017-04-22: 20 ug via INTRAVENOUS
  Administered 2017-04-22: 90 ug via INTRAVENOUS
  Administered 2017-04-22: 10 ug via INTRAVENOUS
  Administered 2017-04-22: 40 ug via INTRAVENOUS
  Administered 2017-04-23 (×4): 10 ug via INTRAVENOUS
  Administered 2017-04-23: 13 ug via INTRAVENOUS
  Administered 2017-04-24 (×2): 10 ug via INTRAVENOUS
  Filled 2017-04-21: qty 25

## 2017-04-21 MED ORDER — LACTATED RINGERS IV SOLN
INTRAVENOUS | Status: DC | PRN
  Administered 2017-04-21 (×2): via INTRAVENOUS

## 2017-04-21 MED ORDER — ACETAMINOPHEN 160 MG/5ML PO SOLN
1000.0000 mg | Freq: Four times a day (QID) | ORAL | Status: DC
  Administered 2017-04-21 – 2017-04-23 (×2): 1000 mg via ORAL
  Filled 2017-04-21: qty 40.6

## 2017-04-21 MED ORDER — ONDANSETRON HCL 4 MG/2ML IJ SOLN
INTRAMUSCULAR | Status: DC | PRN
  Administered 2017-04-21: 4 mg via INTRAVENOUS

## 2017-04-21 MED ORDER — HYDROMORPHONE HCL 1 MG/ML IJ SOLN
INTRAMUSCULAR | Status: AC
  Administered 2017-04-21: 0.5 mg via INTRAVENOUS
  Filled 2017-04-21: qty 1

## 2017-04-21 MED ORDER — METFORMIN HCL 500 MG PO TABS
500.0000 mg | ORAL_TABLET | Freq: Two times a day (BID) | ORAL | Status: DC
  Administered 2017-04-22 – 2017-04-25 (×7): 500 mg via ORAL
  Filled 2017-04-21 (×7): qty 1

## 2017-04-21 MED ORDER — SENNOSIDES-DOCUSATE SODIUM 8.6-50 MG PO TABS
1.0000 | ORAL_TABLET | Freq: Every day | ORAL | Status: DC
Start: 2017-04-21 — End: 2017-04-25
  Administered 2017-04-21 – 2017-04-24 (×4): 1 via ORAL
  Filled 2017-04-21 (×3): qty 1

## 2017-04-21 MED ORDER — HEMOSTATIC AGENTS (NO CHARGE) OPTIME
TOPICAL | Status: DC | PRN
  Administered 2017-04-21: 1 via TOPICAL

## 2017-04-21 MED ORDER — BUPIVACAINE HCL (PF) 0.5 % IJ SOLN
INTRAMUSCULAR | Status: DC | PRN
  Administered 2017-04-21: 10 mL

## 2017-04-21 MED ORDER — POTASSIUM CHLORIDE 10 MEQ/50ML IV SOLN
10.0000 meq | Freq: Every day | INTRAVENOUS | Status: DC | PRN
  Filled 2017-04-21: qty 50

## 2017-04-21 MED ORDER — DIPHENHYDRAMINE HCL 12.5 MG/5ML PO ELIX
12.5000 mg | ORAL_SOLUTION | Freq: Four times a day (QID) | ORAL | Status: DC | PRN
  Filled 2017-04-21: qty 5

## 2017-04-21 MED ORDER — SODIUM CHLORIDE 0.9% FLUSH
9.0000 mL | INTRAVENOUS | Status: DC | PRN
  Administered 2017-04-23: 9 mL via INTRAVENOUS
  Filled 2017-04-21: qty 9

## 2017-04-21 MED ORDER — FENTANYL CITRATE (PF) 100 MCG/2ML IJ SOLN
INTRAMUSCULAR | Status: DC | PRN
  Administered 2017-04-21 (×6): 50 ug via INTRAVENOUS

## 2017-04-21 MED ORDER — HYDRALAZINE HCL 20 MG/ML IJ SOLN
10.0000 mg | Freq: Four times a day (QID) | INTRAMUSCULAR | Status: DC | PRN
  Administered 2017-04-21: 10 mg via INTRAVENOUS
  Filled 2017-04-21: qty 1

## 2017-04-21 MED ORDER — PROPOFOL 10 MG/ML IV BOLUS
INTRAVENOUS | Status: AC
  Filled 2017-04-21: qty 20

## 2017-04-21 MED ORDER — PROPOFOL 10 MG/ML IV BOLUS
INTRAVENOUS | Status: DC | PRN
  Administered 2017-04-21: 130 mg via INTRAVENOUS

## 2017-04-21 SURGICAL SUPPLY — 93 items
ADH SKN CLS APL DERMABOND .7 (GAUZE/BANDAGES/DRESSINGS) ×2
APPLIER CLIP ROT 10 11.4 M/L (STAPLE)
APR CLP MED LRG 11.4X10 (STAPLE)
BAG SPEC RTRVL LRG 6X4 10 (ENDOMECHANICALS)
CANISTER SUCT 3000ML PPV (MISCELLANEOUS) ×4 IMPLANT
CATH KIT ON Q 5IN SLV (PAIN MANAGEMENT) IMPLANT
CATH KIT ON-Q SILVERSOAK 5 (CATHETERS) IMPLANT
CATH KIT ON-Q SILVERSOAK 5IN (CATHETERS) ×4 IMPLANT
CATH THORACIC 28FR (CATHETERS) IMPLANT
CATH THORACIC 28FR RT ANG (CATHETERS) IMPLANT
CATH THORACIC 36FR (CATHETERS) IMPLANT
CATH THORACIC 36FR RT ANG (CATHETERS) IMPLANT
CLIP APPLIE ROT 10 11.4 M/L (STAPLE) IMPLANT
CLIP VESOCCLUDE MED 6/CT (CLIP) ×4 IMPLANT
CLOSURE WOUND 1/2 X4 (GAUZE/BANDAGES/DRESSINGS) ×1
CONN ST 1/4X3/8  BEN (MISCELLANEOUS)
CONN ST 1/4X3/8 BEN (MISCELLANEOUS) IMPLANT
CONN Y 3/8X3/8X3/8  BEN (MISCELLANEOUS)
CONN Y 3/8X3/8X3/8 BEN (MISCELLANEOUS) IMPLANT
CONT SPEC 4OZ CLIKSEAL STRL BL (MISCELLANEOUS) ×56 IMPLANT
COVER SURGICAL LIGHT HANDLE (MISCELLANEOUS) IMPLANT
CUTTER ECHEON FLEX ENDO 45 340 (ENDOMECHANICALS) ×3 IMPLANT
DERMABOND ADVANCED (GAUZE/BANDAGES/DRESSINGS) ×2
DERMABOND ADVANCED .7 DNX12 (GAUZE/BANDAGES/DRESSINGS) ×1 IMPLANT
DRAIN CHANNEL 28F RND 3/8 FF (WOUND CARE) ×3 IMPLANT
DRAIN CHANNEL 32F RND 10.7 FF (WOUND CARE) IMPLANT
DRAPE LAPAROSCOPIC ABDOMINAL (DRAPES) ×4 IMPLANT
DRAPE WARM FLUID 44X44 (DRAPE) ×4 IMPLANT
ELECT BLADE 6.5 EXT (BLADE) ×4 IMPLANT
ELECT REM PT RETURN 9FT ADLT (ELECTROSURGICAL) ×4
ELECTRODE REM PT RTRN 9FT ADLT (ELECTROSURGICAL) ×2 IMPLANT
GAUZE SPONGE 4X4 12PLY STRL (GAUZE/BANDAGES/DRESSINGS) ×4 IMPLANT
GLOVE BIO SURGEON STRL SZ 6.5 (GLOVE) ×2 IMPLANT
GLOVE BIO SURGEONS STRL SZ 6.5 (GLOVE) ×1
GLOVE BIOGEL PI IND STRL 6.5 (GLOVE) ×2 IMPLANT
GLOVE BIOGEL PI INDICATOR 6.5 (GLOVE) ×2
GLOVE SURG SIGNA 7.5 PF LTX (GLOVE) ×8 IMPLANT
GOWN STRL REUS W/ TWL LRG LVL3 (GOWN DISPOSABLE) ×4 IMPLANT
GOWN STRL REUS W/ TWL XL LVL3 (GOWN DISPOSABLE) ×2 IMPLANT
GOWN STRL REUS W/TWL LRG LVL3 (GOWN DISPOSABLE) ×8
GOWN STRL REUS W/TWL XL LVL3 (GOWN DISPOSABLE) ×4
HEMOSTAT SURGICEL 2X14 (HEMOSTASIS) IMPLANT
KIT BASIN OR (CUSTOM PROCEDURE TRAY) ×4 IMPLANT
KIT ROOM TURNOVER OR (KITS) ×4 IMPLANT
KIT SUCTION CATH 14FR (SUCTIONS) IMPLANT
NS IRRIG 1000ML POUR BTL (IV SOLUTION) ×12 IMPLANT
PACK CHEST (CUSTOM PROCEDURE TRAY) ×4 IMPLANT
PAD ARMBOARD 7.5X6 YLW CONV (MISCELLANEOUS) ×8 IMPLANT
POUCH ENDO CATCH II 15MM (MISCELLANEOUS) ×4 IMPLANT
POUCH SPECIMEN RETRIEVAL 10MM (ENDOMECHANICALS) IMPLANT
RELOAD STAPLE 45 4.1 GRN THCK (STAPLE) IMPLANT
SCISSORS ENDO CVD 5DCS (MISCELLANEOUS) IMPLANT
SEALANT PROGEL (MISCELLANEOUS) IMPLANT
SEALANT SURG COSEAL 4ML (VASCULAR PRODUCTS) IMPLANT
SEALANT SURG COSEAL 8ML (VASCULAR PRODUCTS) IMPLANT
SHEARS HARMONIC HDI 20CM (ELECTROSURGICAL) ×4 IMPLANT
SOLUTION ANTI FOG 6CC (MISCELLANEOUS) ×4 IMPLANT
SPECIMEN JAR MEDIUM (MISCELLANEOUS) ×4 IMPLANT
SPONGE INTESTINAL PEANUT (DISPOSABLE) ×12 IMPLANT
SPONGE TONSIL 1 RF SGL (DISPOSABLE) ×4 IMPLANT
STAPLE RELOAD 2.5MM WHITE (STAPLE) ×20 IMPLANT
STAPLE RELOAD 45 GRN (STAPLE) ×2 IMPLANT
STAPLE RELOAD 45MM GOLD (STAPLE) ×4 IMPLANT
STAPLE RELOAD 45MM GREEN (STAPLE) ×4
STAPLER VASCULAR ECHELON 35 (CUTTER) ×3 IMPLANT
STRIP CLOSURE SKIN 1/2X4 (GAUZE/BANDAGES/DRESSINGS) ×2 IMPLANT
SUT PROLENE 4 0 RB 1 (SUTURE)
SUT PROLENE 4-0 RB1 .5 CRCL 36 (SUTURE) IMPLANT
SUT SILK  1 MH (SUTURE) ×4
SUT SILK 1 MH (SUTURE) ×4 IMPLANT
SUT SILK 1 TIES 10X30 (SUTURE) ×4 IMPLANT
SUT SILK 2 0 SH (SUTURE) IMPLANT
SUT SILK 2 0SH CR/8 30 (SUTURE) IMPLANT
SUT SILK 3 0 SH 30 (SUTURE) IMPLANT
SUT SILK 3 0SH CR/8 30 (SUTURE) ×4 IMPLANT
SUT VIC AB 1 CTX 36 (SUTURE) ×4
SUT VIC AB 1 CTX36XBRD ANBCTR (SUTURE) ×2 IMPLANT
SUT VIC AB 2-0 CTX 36 (SUTURE) ×4 IMPLANT
SUT VIC AB 3-0 MH 27 (SUTURE) IMPLANT
SUT VIC AB 3-0 X1 27 (SUTURE) ×7 IMPLANT
SUT VICRYL 0 UR6 27IN ABS (SUTURE) ×4 IMPLANT
SUT VICRYL 2 TP 1 (SUTURE) IMPLANT
SYR 10ML LL (SYRINGE) ×4 IMPLANT
SYSTEM SAHARA CHEST DRAIN ATS (WOUND CARE) ×4 IMPLANT
TAPE CLOTH 4X10 WHT NS (GAUZE/BANDAGES/DRESSINGS) ×4 IMPLANT
TAPE CLOTH SURG 4X10 WHT LF (GAUZE/BANDAGES/DRESSINGS) ×4 IMPLANT
TIP APPLICATOR SPRAY EXTEND 16 (VASCULAR PRODUCTS) IMPLANT
TOWEL GREEN STERILE (TOWEL DISPOSABLE) ×4 IMPLANT
TOWEL GREEN STERILE FF (TOWEL DISPOSABLE) ×4 IMPLANT
TRAY FOLEY W/METER SILVER 16FR (SET/KITS/TRAYS/PACK) ×4 IMPLANT
TROCAR XCEL BLADELESS 5X75MML (TROCAR) ×4 IMPLANT
TUNNELER SHEATH ON-Q 11GX8 DSP (PAIN MANAGEMENT) ×4 IMPLANT
WATER STERILE IRR 1000ML POUR (IV SOLUTION) ×4 IMPLANT

## 2017-04-21 NOTE — Interval H&P Note (Signed)
History and Physical Interval Note:  04/21/2017 7:55 AM  [name] [name]  has presented today for surgery, with the diagnosis of LUL NODULE  The various methods of treatment have been discussed with the patient and family. After consideration of risks, benefits and other options for treatment, the patient has consented to  Procedure(s): VIDEO ASSISTED THORACOSCOPY (VATS)/LEFT UPPER LOBECTOMY (Left) SEGMENTECTOMY (Left) as a surgical intervention .  The patient's history has been reviewed, patient examined, no change in status, stable for surgery.  I have reviewed the patient's chart and labs.  Questions were answered to the patient's satisfaction.     Melrose Nakayama

## 2017-04-21 NOTE — Anesthesia Procedure Notes (Signed)
Central Venous Catheter Insertion Performed by: Suzette Battiest, anesthesiologist Start/End9/20/2018 7:20 AM, 04/21/2017 7:30 AM Patient location: Pre-op. Preanesthetic checklist: patient identified, IV checked, site marked, risks and benefits discussed, surgical consent, monitors and equipment checked, pre-op evaluation, timeout performed and anesthesia consent Position: Trendelenburg Lidocaine 1% used for infiltration and patient sedated Hand hygiene performed , maximum sterile barriers used  and Seldinger technique used Catheter size: 8 Fr Total catheter length 16. Central line was placed.Double lumen Procedure performed using ultrasound guided technique. Ultrasound Notes:anatomy identified, needle tip was noted to be adjacent to the nerve/plexus identified, no ultrasound evidence of intravascular and/or intraneural injection and image(s) printed for medical record Attempts: 1 Following insertion, dressing applied, line sutured and Biopatch. Post procedure assessment: blood return through all ports  Patient tolerated the procedure well with no immediate complications.

## 2017-04-21 NOTE — Progress Notes (Signed)
      [address]       [place] [address]             [phone]      S/p LUL  Some pain  BP (!) 129/39   Pulse (!) 58   Temp (!) 97.4 F (36.3 C) (Oral)   Resp 12   Ht 5\' 9"  (1.753 m)   Wt 196 lb (88.9 kg)   SpO2 97%   BMI 28.94 kg/m    Intake/Output Summary (Last 24 hours) at 04/21/17 1941 Last data filed at 04/21/17 1900  Gross per 24 hour  Intake          2066.25 ml  Output             1050 ml  Net          1016.25 ml   Minimal CT output  Doing well early postop  Remo Lipps C. Roxan Hockey, MD [facility] [phone]

## 2017-04-21 NOTE — Op Note (Signed)
NAME:  [name] [name], [name] [name]               ACCOUNT NO.:  [id number]  MEDICAL RECORD NO.:  [id number]  LOCATION:                                 FACILITY:  PHYSICIAN:  Revonda Standard. Ileene Allie, M.D.DATE OF BIRTH:  1934/10/15  DATE OF PROCEDURE:  04/21/2017 DATE OF DISCHARGE:                              OPERATIVE REPORT   PREOPERATIVE DIAGNOSIS:  Non-small cell carcinoma, left upper lobe, clinical stage IA.  POSTOPERATIVE DIAGNOSIS:  Non-small cell carcinoma, left upper lobe, clinical stage IA.  PROCEDURE:   Left video-assisted thoracoscopy, Thoracoscopic left upper lobectomy, Mediastinal lymph node dissection,  On-Q local anesthetic catheter placement.  SURGEON:  Revonda Standard. Roxan Hockey, M.D.  ASSISTANT:  John Giovanni, PA-C.  ANESTHESIA:  General.  FINDINGS:  Mass present in superior aspect of left upper lobe, multiple enlarged but otherwise benign-appearing lymph nodes.  Frozen section of the bronchial margin, no tumor seen.  CLINICAL NOTE:  Ms. Milson is a 82 year old woman with a history of tobacco abuse and multiple other medical problems.  She presented back in June with right anterior chest wall pain.  A chest x-ray suggested a possible pneumonia.  After treating with antibiotics, a CT was done which showed a 1.7-cm spiculated nodule in the left upper lobe.  There was a borderline enlarged paratracheal node.  CT-guided biopsy showed squamous cell carcinoma.  Bronchoscopy and endobronchial ultrasound revealed no evidence of involvement of the mediastinal lymph nodes. After considering the options of surgical resection versus stereotactic radiation, she wished to proceed with surgical resection.  She received cardiac clearance.  The indications, risks, benefits, and alternatives were discussed in detail with the patient.  She understood and accepted the risks and agreed to proceed.  OPERATIVE NOTE:  Ms. Whitford was brought to the preoperative holding area on April 21, 2016.  Anesthesia placed a central line and arterial blood pressure monitoring line.  She was taken to the operating room, anesthetized and intubated with a double-lumen endotracheal tube. Intravenous antibiotics were administered.  A Foley catheter was placed. Sequential compression devices were placed on the calves for DVT prophylaxis.  She was placed in a right lateral decubitus position and the left chest was prepped and draped in usual sterile fashion.  Single lung ventilation of the right lung was initiated and it was tolerated well throughout the procedure.  An incision was made in the seventh interspace in the mid axillary line. A 5-mm port was inserted into the chest.  The thoracoscope was advanced into the chest.  There was good isolation of the left lung, although it did take some time for the lung to deflate.  There was no abnormality of the parietal pleura and no pleural effusion.  A 5-cm working incision was made in the fourth interspace anterolaterally.  No rib spreading was performed during the procedure.  The inferior ligament was divided.  All lymph nodes that were encountered during the dissection were removed and sent as separate specimens for permanent pathology.  The pleural reflection then was divided at the hilum posteriorly.  Initially, level 7 nodes were removed.  There was 1 level 7 node that appeared abnormal relative to the others.  This was sent  for frozen section, which returned with no tumor seen.  The pleural reflection was divided at the hilum anteriorly.  The aortopulmonary window was opened and multiple nodes were removed from that area.  The dissection of the fissure then was begun.  The fissure was nearly complete anteriorly.  The pleura overlying the pulmonary artery was dissected with electrocautery.  Nodes surrounding the vessels were removed.  The plane was developed over the pulmonary artery in the fissure posteriorly where it was incomplete and  the parenchma was divided with an endoscopic stapler using a gold cartridge. Assessing the anatomy, it did not appear favorable to spare the lingula.  There were 2 large lingular vessels and lingular vein which covered the bronchus.  The lingular arterial vessels were dissected out, encircled and divided with the endoscopic vascular stapler.  The superior pulmonary vein then was dissected out and encircled and divided with a vascular stapler as well.  Carrying the dissection further anteriorly, anterior and apical branches of the pulmonary artery were dissected out and divided with the stapler and finally the posterior pulmonary artery branch was divided.  Because the angle of some of the vessels, a second port incision was made anterior to the first for placement of the vascular stapler.  An Echelon stapler with a green cartridge then was placed across the left upper lobe bronchus at its origin and closed.  A test inflation showed good aeration of the lower lobe.  The stapler was fired transecting the left upper lobe bronchus.  The left upper lobe was placed into an endoscopic retrieval bag, removed and sent for frozen section of the bronchial margin that subsequently returned with no tumor seen.  The chest was copiously irrigated with warm saline.  A test inflation at 30 cm of water showed no leakage from the bronchial stump or the parenchyma.  An On-Q local anesthetic catheter was tunneled through a separate stab incision posteriorly.  It was tunneled into a subpleural location and primed with 5 mL of 0.5% Marcaine.  A 28-French chest tube was placed through the anterior port incision and secured to skin with #1 silk suture.  The lower lobe was reinflated.  The remaining port incision was closed in standard fashion with a #1 Vicryl fascial suture and a 3-0 Vicryl subcuticular suture.  The working incision was closed with a #1 Vicryl fascial suture, 2-0 Vicryl subcutaneous suture, and a  3-0 Vicryl subcuticular suture.  The patient was placed back in a supine position.  She was extubated in the operating room and taken to postanesthetic care unit in good condition.  All sponge, needle, and instrument counts were correct at the end of the procedure.     Revonda Standard Roxan Hockey, M.D.     SCH/MEDQ  D:  04/21/2017  T:  04/21/2017  Job:  [id number]

## 2017-04-21 NOTE — Transfer of Care (Signed)
Immediate Anesthesia Transfer of Care Note  Patient: [name] [name]  Procedure(s) Performed: Procedure(s): VIDEO ASSISTED THORACOSCOPY (VATS)/LEFT UPPER LOBECTOMY (Left)  Patient Location: PACU  Anesthesia Type:General  Level of Consciousness: awake, alert  and patient cooperative  Airway & Oxygen Therapy: Patient Spontanous Breathing  Post-op Assessment: Report given to RN and Post -op Vital signs reviewed and stable  Post vital signs: Reviewed and stable  Last Vitals:  Vitals:   04/21/17 0549 04/21/17 1128  BP: (!) 155/57   Pulse: 63 (!) 53  Resp: 19 13  Temp: 36.7 C   SpO2: 99% 97%    Last Pain:  Vitals:   04/21/17 0549  TempSrc: Oral      Patients Stated Pain Goal: 3 (24/09/73 5329)  Complications: No apparent anesthesia complications

## 2017-04-21 NOTE — Brief Op Note (Addendum)
04/21/2017  5:56 PM  PATIENT:  [name] [name]  82 y.o. female  PRE-OPERATIVE DIAGNOSIS:  NON SMALL CELL CARCINOMA LEFT UPPER LOBE  POST-OPERATIVE DIAGNOSIS:  NON SMALL CELL CARCINOMA LEFT UPPER LOBE  PROCEDURE:  Procedure(s): VIDEO ASSISTED THORACOSCOPY (VATS)/LEFT UPPER LOBECTOMY (Left) MEDIASTINAL LYMPH NODE DISSECTION ON-Q LOCAL ANESTHETIC CATHETER PLACEMENT  SURGEON:  Surgeon(s) and Role:    * Melrose Nakayama, MD - Primary  PHYSICIAN ASSISTANT: WAYNE GOLD PA-C  ANESTHESIA:   general  EBL:  Total I/O In: 1691.3 [P.O.:60; I.V.:1631.3] Out: 970 [Urine:725; Blood:100; Chest Tube:145]  BLOOD ADMINISTERED:none  DRAINS: 1  Chest Tube(s) in the LEFT HEMITHORAX   LOCAL MEDICATIONS USED:  MARCAINE     SPECIMEN:  Source of Specimen:  LUL, MULTIPLE LN SAMPLES  DISPOSITION OF SPECIMEN:  PATHOLOGY  COUNTS:  YES  TOURNIQUET:  * No tourniquets in log *  DICTATION: .Other Dictation: Dictation Number [id number]  PLAN OF CARE: Admit to inpatient   PATIENT DISPOSITION:  PACU - hemodynamically stable.   Delay start of Pharmacological VTE agent (>24hrs) due to surgical blood loss or risk of bleeding: yes  COMPLICATIONS: NO KNOWN  FROZEN, 1 LN-NEG(L7), NEG BRONCHIAL MARGIN

## 2017-04-21 NOTE — Anesthesia Postprocedure Evaluation (Signed)
Anesthesia Post Note  Patient: [name] [name]  Procedure(s) Performed: Procedure(s) (LRB): VIDEO ASSISTED THORACOSCOPY (VATS)/LEFT UPPER LOBECTOMY (Left)     Patient location during evaluation: PACU Anesthesia Type: General Level of consciousness: awake and alert Pain management: pain level controlled Vital Signs Assessment: post-procedure vital signs reviewed and stable Respiratory status: spontaneous breathing, nonlabored ventilation, respiratory function stable and patient connected to nasal cannula oxygen Cardiovascular status: blood pressure returned to baseline and stable Postop Assessment: no apparent nausea or vomiting Anesthetic complications: no    Last Vitals:  Vitals:   04/21/17 1345 04/21/17 1400  BP: (!) 143/70 (!) 132/58  Pulse: (!) 56 (!) 49  Resp:  11  Temp:    SpO2: 99% 96%    Last Pain:  Vitals:   04/21/17 1330  TempSrc:   PainSc: 7                  Tiajuana Amass

## 2017-04-21 NOTE — Anesthesia Procedure Notes (Signed)
Arterial Line Insertion Start/End9/20/2018 7:10 AM, 04/21/2017 7:20 AM Performed by: Lance Coon, CRNA  Patient location: Pre-op. Preanesthetic checklist: patient identified, IV checked, site marked, risks and benefits discussed, surgical consent, monitors and equipment checked, pre-op evaluation, timeout performed and anesthesia consent Lidocaine 1% used for infiltration and patient sedated Right, radial was placed Catheter size: 20 G Hand hygiene performed  and maximum sterile barriers used  Allen's test indicative of satisfactory collateral circulation Attempts: 1 Procedure performed without using ultrasound guided technique. Following insertion, Biopatch and dressing applied. Post procedure assessment: normal  Patient tolerated the procedure well with no immediate complications.

## 2017-04-21 NOTE — H&P (View-Only) (Signed)
[address]       [place] [address]             [phone]       HPI: [name] [name] returns to discuss possible surgical resection of a left upper lobe squamous cell carcinoma.  She is a 82 year old woman with a history of tobacco abuse, coronary disease, stroke, diabetes, hypertension, and arthritis. She presented back in June with right anterior chest wall pain. A chest x-ray suggested a possible pneumonia. She was treated with empiric antibiotics. A CT showed a 1.7 cm spiculated nodule in the left upper lobe. There also was a borderline enlarged paratracheal node. She had a CT-guided biopsy which showed squamous cell carcinoma. I did bronchoscopy and endobronchial ultrasound in July. There was no evidence of involvement of mediastinal lymph nodes. She was seen in consultation by Dr. Tammi Klippel discuss possible stereotactic radiation. After thinking over options she wished to proceed with surgical resection. She needed cardiac clearance from Dr. Doylene Canard. That now has been confirmed.  She is been feeling well. She has not had any recent cough or hemoptysis. She denies any fevers, chills, or sweats. Her appetite is good. She denies any chest pain, pressure, or tightness.  Past Medical History:  Diagnosis Date  . Anginal pain ([place])   . Arthritis   . Coronary artery disease   . Diabetes mellitus   . Dyspnea   . Early cataracts, bilateral   . Headache   . Hypertension   . Lung cancer ([place])   . Mass of upper lobe of left lung    mediastinal adenopathy  . Palpitations   . Pneumonia   . Stroke ([place])   . Wears dentures   . Wears glasses      Current Outpatient Prescriptions  Medication Sig Dispense Refill  . amLODipine (NORVASC) 10 MG tablet Take 10 mg by mouth daily.      Marland Kitchen aspirin EC 81 MG tablet Take 81 mg by mouth daily.      Marland Kitchen atorvastatin (LIPITOR) 10 MG tablet Take 10 mg by mouth daily.  5  . benzonatate (TESSALON) 100 MG capsule Take 100-200 mg by mouth  3 (three) times daily as needed for cough.    . Cholecalciferol (VITAMIN D3 PO) Take 1 tablet by mouth daily.    . hydrALAZINE (APRESOLINE) 25 MG tablet Take 25 mg by mouth 2 (two) times daily.     . isosorbide dinitrate (ISORDIL) 20 MG tablet Take 20 mg by mouth 2 (two) times daily.     Marland Kitchen LOSARTAN POTASSIUM PO Take 1 tablet by mouth daily.    . meclizine (ANTIVERT) 25 MG tablet Take 25 mg by mouth 2 (two) times daily as needed for dizziness.    . metFORMIN (GLUCOPHAGE) 500 MG tablet Take 500 mg by mouth 2 (two) times daily.     . metoprolol succinate (TOPROL-XL) 25 MG 24 hr tablet Take 25 mg by mouth every evening.     . nitroGLYCERIN (NITROSTAT) 0.4 MG SL tablet Place 0.4 mg under the tongue every 5 (five) minutes as needed for chest pain.    . pioglitazone (ACTOS) 30 MG tablet Take 30 mg by mouth every evening.      No current facility-administered medications for this visit.     Physical Exam BP (!) 150/74 (BP Location: Right Arm, Patient Position: Sitting, Cuff Size: Large)   Resp 16   Ht 5\' 9"  (1.753 m)   Wt 199 lb  6.4 oz (90.4 kg)   SpO2 98% Comment: ON RA  BMI 29.84 kg/m  82 year old woman in no acute distress Alert and oriented 3 with no focal deficits No cervical or supraclavicular adenopathy Cardiac regular rate and rhythm normal S1 and S2 Lungs clear with equal breath sounds bilaterally Abdomen benign Extremities without clubbing cyanosis or edema   Diagnostic Tests: Pulmonary function testing FVC 1.87 (71%) FEV1 1.40 (68%) FEV1 1.61 (78%) post bronchodilator TLC 6.73 (116%) RV 5.35 (203%)  Impression: [name] [name] 82 year old woman with history of tobacco abuse who has a left upper lobe squamous cell carcinoma. There was some right paratracheal adenopathy on CT but this was negative on PET and EBUS aspirations showed no evidence of tumor. Findings are consistent with stage I disease.  I again discussed the options of surgery versus radiation. She understands  the relative advantages and disadvantages of both approaches.  We discussed left VATS for left upper lobectomy or possible lingular sparing left upper lobectomy (segmentectomy). I informed her the general nature of the procedure, the incisions to be used, the need for general anesthesia, use of the drainage tube postoperatively, the expected hospital stay, and the overall recovery. She understands that this is the best chance for cure but does not guarantee a cure. I informed her the indications, risks, benefits, and alternatives. She understands the risks include, but are not limited to death, MI, DVT, PE, bleeding, possible need for transfusion, infection, prolonged air leak, irregular heart rhythms, as well as the possibility of other unforeseeable complications.  She accepts the risks and wishes to proceed  Plan: Left VATS for left upper lobectomy or segmentectomy on Thursday, 04/21/2017  Melrose Nakayama, MD [facility] [phone]

## 2017-04-21 NOTE — Anesthesia Procedure Notes (Addendum)
Procedure Name: Intubation Date/Time: 04/21/2017 8:19 AM Performed by: Lance Coon Pre-anesthesia Checklist: Patient identified, Emergency Drugs available, Suction available, Patient being monitored and Timeout performed Patient Re-evaluated:Patient Re-evaluated prior to induction Oxygen Delivery Method: Circle system utilized Preoxygenation: Pre-oxygenation with 100% oxygen Induction Type: IV induction Ventilation: Mask ventilation without difficulty Laryngoscope Size: Mac and 3 Grade View: Grade II Endobronchial tube: Left and 37 Fr Number of attempts: 1 Airway Equipment and Method: Stylet Placement Confirmation: ETT inserted through vocal cords under direct vision,  positive ETCO2 and breath sounds checked- equal and bilateral Tube secured with: Tape Comments: Placement confirmed by bronchoscope per Dr. Orene Desanctis. Lung isolation achieved.

## 2017-04-22 ENCOUNTER — Inpatient Hospital Stay (HOSPITAL_COMMUNITY): Payer: Medicare Other

## 2017-04-22 ENCOUNTER — Encounter (HOSPITAL_COMMUNITY): Payer: Self-pay | Admitting: Thoracic Surgery (Cardiothoracic Vascular Surgery)

## 2017-04-22 LAB — BLOOD GAS, ARTERIAL
ACID-BASE DEFICIT: 3.8 mmol/L — AB (ref 0.0–2.0)
Bicarbonate: 20.1 mmol/L (ref 20.0–28.0)
FIO2: 21
O2 SAT: 97.5 %
PCO2 ART: 32.8 mmHg (ref 32.0–48.0)
Patient temperature: 98.6
pH, Arterial: 7.404 (ref 7.350–7.450)
pO2, Arterial: 99.7 mmHg (ref 83.0–108.0)

## 2017-04-22 LAB — BASIC METABOLIC PANEL
ANION GAP: 7 (ref 5–15)
BUN: 16 mg/dL (ref 6–20)
CO2: 19 mmol/L — ABNORMAL LOW (ref 22–32)
Calcium: 8.2 mg/dL — ABNORMAL LOW (ref 8.9–10.3)
Chloride: 112 mmol/L — ABNORMAL HIGH (ref 101–111)
Creatinine, Ser: 0.92 mg/dL (ref 0.44–1.00)
GFR, EST NON AFRICAN AMERICAN: 58 mL/min — AB (ref >60)
GLUCOSE: 125 mg/dL — AB (ref 65–99)
POTASSIUM: 3.5 mmol/L (ref 3.5–5.1)
SODIUM: 138 mmol/L (ref 135–145)

## 2017-04-22 LAB — CBC
HCT: 29.2 % — ABNORMAL LOW (ref 36.0–46.0)
Hemoglobin: 9.2 g/dL — ABNORMAL LOW (ref 12.0–15.0)
MCH: 25.5 pg — ABNORMAL LOW (ref 26.0–34.0)
MCHC: 31.5 g/dL (ref 30.0–36.0)
MCV: 80.9 fL (ref 78.0–100.0)
PLATELETS: 236 10*3/uL (ref 150–400)
RBC: 3.61 MIL/uL — ABNORMAL LOW (ref 3.87–5.11)
RDW: 14.8 % (ref 11.5–15.5)
WBC: 7.4 10*3/uL (ref 4.0–10.5)

## 2017-04-22 LAB — GLUCOSE, CAPILLARY
GLUCOSE-CAPILLARY: 135 mg/dL — AB (ref 65–99)
GLUCOSE-CAPILLARY: 175 mg/dL — AB (ref 65–99)
GLUCOSE-CAPILLARY: 139 mg/dL — AB (ref 65–99)
Glucose-Capillary: 130 mg/dL — ABNORMAL HIGH (ref 65–99)

## 2017-04-22 MED ORDER — POTASSIUM CHLORIDE 10 MEQ/50ML IV SOLN
10.0000 meq | INTRAVENOUS | Status: AC
  Administered 2017-04-22 (×3): 10 meq via INTRAVENOUS
  Filled 2017-04-22 (×2): qty 50

## 2017-04-22 MED ORDER — ENOXAPARIN SODIUM 40 MG/0.4ML ~~LOC~~ SOLN
40.0000 mg | SUBCUTANEOUS | Status: DC
  Administered 2017-04-22 – 2017-04-25 (×4): 40 mg via SUBCUTANEOUS
  Filled 2017-04-22 (×4): qty 0.4

## 2017-04-22 MED ORDER — INSULIN ASPART 100 UNIT/ML ~~LOC~~ SOLN
0.0000 [IU] | Freq: Three times a day (TID) | SUBCUTANEOUS | Status: DC
  Administered 2017-04-22 – 2017-04-24 (×5): 2 [IU] via SUBCUTANEOUS

## 2017-04-22 MED ORDER — LEVALBUTEROL HCL 0.63 MG/3ML IN NEBU: 0.6300 mg | INHALATION_SOLUTION | Freq: Four times a day (QID) | RESPIRATORY_TRACT | Status: DC | PRN

## 2017-04-22 NOTE — Discharge Instructions (Signed)
Video-Assisted Thoracic Surgery, Care After ° °This sheet gives you information about how to care for yourself after your procedure. Your health care provider may also give you more specific instructions. If you have problems or questions, contact your health care provider. °What can I expect after the procedure? °After the procedure, it is common to have: °· Some pain and soreness in your chest. °· Pain when breathing in (inhaling) and coughing. °· Constipation. °· Fatigue. °· Difficulty sleeping. ° °Follow these instructions at home: °Preventing pneumonia °· Take deep breaths or do breathing exercises as instructed by your health care provider. Doing this helps prevent lung infection (pneumonia). °· Cough frequently. Coughing may cause discomfort, but it is important to clear mucus (phlegm) and expand your lungs. If it hurts to cough, hold a pillow against your chest or place the palms of both hands on top of the incision (use splinting) when you cough. This may help relieve discomfort. °· If you were given an incentive spirometer, use it as directed. An incentive spirometer is a tool that measures how well you are filling your lungs with each breath. °· Participate in pulmonary rehabilitation as directed by your health care provider. This is a program that combines education, exercise, and support from a team of specialists. The goal is to help you heal and get back to your normal activities as soon as possible. °Medicines °· Take over-the-counter or prescription medicines only as told by your health care provider. °· If you have pain, take pain-relieving medicine before your pain becomes severe. This is important because if your pain is under control, you will be able to breathe and cough more comfortably. °· If you were prescribed an antibiotic medicine, take it as told by your health care provider. Do not stop taking the antibiotic even if you start to feel better. °Activity °· Ask your health care provider  what activities are safe for you. °· Avoid activities that use your chest muscles for at least 3-4 weeks. °· Do not lift anything that is heavier than 10 lb (4.5 kg), or the limit that your health care provider tells you, until he or she says that it is safe. °Incision care °· Follow instructions from your health care provider about how to take care of your incision(s). Make sure you: °? Wash your hands with soap and water before you change your bandage (dressing). If soap and water are not available, use hand sanitizer. °? Change your dressing as told by your health care provider. °? Leave stitches (sutures), skin glue, or adhesive strips in place. These skin closures may need to stay in place for 2 weeks or longer. If adhesive strip edges start to loosen and curl up, you may trim the loose edges. Do not remove adhesive strips completely unless your health care provider tells you to do that. °· Keep your dressing dry until it has been removed. °· Check your incision area every day for signs of infection. Check for: °? Redness, swelling, or pain. °? Fluid or blood. °? Warmth. °? Pus or a bad smell. °Bathing °· Do not take baths, swim, or use a hot tub until your health care provider approves. You may take showers. °· After your dressing has been removed, use soap and water to gently wash your incision area. Do not use anything else to clean your incision(s) unless your health care provider tells you to do this. °Driving °· Do not drive until your health care provider approves. °· Do not drive   or use heavy machinery while taking prescription pain medicine. °Eating and drinking °· Eat a healthy, balanced diet as instructed by your health care provider. A healthy diet includes plenty of fresh fruits and vegetables, whole grains, and low-fat (lean) proteins. °· Limit foods that are high in fat and processed sugars, such as fried and sweet foods. °· Drink enough fluid to keep your urine clear or pale yellow. °General  instructions °· To prevent or treat constipation while you are taking prescription pain medicine, your health care provider may recommend that you: °? Take over-the-counter or prescription medicines. °? Eat foods that are high in fiber, such as beans, fresh fruits and vegetables, and whole grains. °· Do not use any products that contain nicotine or tobacco, such as cigarettes and e-cigarettes. If you need help quitting, ask your health care provider. °· Avoid secondhand smoke. °· Wear compression stockings as told by your health care provider. These stockings help to prevent blood clots and reduce swelling in your legs. °· If you have a chest tube, care for it as instructed by your health care provider. Do not travel by airplane during the 2 weeks after your chest tube is removed, or until your health care provider says that this is safe. °· Keep all follow-up visits as told by your health care provider. This is important. °Contact a health care provider if: °· You have redness, swelling, or pain around an incision. °· You have fluid or blood coming from an incision. °· Your incision area feels warm to the touch. °· You have pus or a bad smell coming from an incision. °· You have a fever or chills. °· You have nausea or vomiting. °· You have pain that does not get better with medicine. °Get help right away if: °· You have chest pain. °· Your heart is fluttering or beating rapidly. °· You develop a rash. °· You have shortness of breath or trouble breathing. °· You are confused. °· You have trouble speaking. °· You feel weak, light-headed, or dizzy. °· You faint. °Summary °· To help prevent lung infection (pneumonia), take deep breaths or do breathing exercises as instructed by your health care provider. °· Cough frequently to clear mucus (phlegm) and expand your lungs. If it hurts to cough, hold a pillow against your chest or place the palms of both hands on top of the incision (use splinting) when you cough. °· If  you have pain, take pain-relieving medicine before your pain becomes severe. This is important because if your pain is under control, you will be able to breathe and cough more comfortably. °· Ask your health care provider what activities are safe for you. °This information is not intended to replace advice given to you by your health care provider. Make sure you discuss any questions you have with your health care provider. °Document Released: 11/13/2012 Document Revised: 06/28/2016 Document Reviewed: 06/28/2016 °[employer]vier Interactive Patient Education © 2017 [employer] ° °

## 2017-04-22 NOTE — Discharge Summary (Signed)
Physician Discharge Summary  Patient ID: TANAIRI CYPERT MRN: [id number] DOB/AGE: May 07, 1935 82 y.o.  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 ([place]) 02/16/2017  . Solitary pulmonary nodule 02/08/2017  . Shortness of breath 12/29/2016    Class: Acute  . Peripheral vascular disease, unspecified ([place]) 07/12/2013  . Atherosclerosis of native arteries of the extremities with intermittent claudication 07/12/2013  . Chest pain, cardiac 06/08/2011    Class: Acute  . Diabetes mellitus ([place]) 06/08/2011    Class: History of  . HTN (hypertension), benign 06/08/2011    Class: Chronic  . Obesity (BMI 30-39.9) 06/08/2011    Class: History of  . 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 S/P lobectomy of lung 04/21/2017  . 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 ([place]) 02/16/2017  . Solitary pulmonary nodule 02/08/2017  . Shortness of breath 12/29/2016    Class: Acute  . Peripheral vascular disease, unspecified ([place]) 07/12/2013  . Atherosclerosis of native arteries of the extremities with intermittent claudication 07/12/2013  . Chest pain, cardiac 06/08/2011    Class: Acute  . Diabetes mellitus ([place]) 06/08/2011    Class: History of  . HTN (hypertension), benign 06/08/2011    Class: Chronic  . Obesity (BMI 30-39.9) 06/08/2011    Class: History of  . Coronary artery disease 06/08/2011    Class: History of   Discharged Condition: good  History of Present Illness:  Mrs. Whan is a 82 yo female with history of tobacco abuse, CAD, Stroke, DM, Hypertension, and Arthritis.  She originally presented to the hospital in June with anterior  chest wall pain on her right side.  CXR at that time suggested possible pneumonia.  She was treated with antibiotics.  CT scan of the chest was later obtained which showed a 1.7 cm spiculated nodule in the left upper lobe and an enlarged paratracheal node.  CT guided biopsy was performed which showed squamous cell carcinoma.  She was evaluated by Dr. Roxan Hockey who performed a bronchoscopy and endobronchial ultrasound in July.  There was no evidence of involvement in the mediastinal lymph nodes.  She was evaluated by Dr. Tammi Klippel to discuss possible stereotactic radiation.  However, patient ultimately decided to proceed with surgical resection.  Prior to this she underwent cardiac clearance by Dr. Doylene Canard.  The risks and benefits of the procedure were explained to the patient and she was agreeable to proceed.  Hospital Course:   Mrs. Nickelson presented to [facility] on 04/21/2017.  She was taken to the operating room and underwent Left VATs with left upper lobectomy, lymph node dissection, and placement of On-Q Anesthetic catheter.  She tolerated the procedure without difficulty, was extubated, and taken to the SICU in stable condition.  Post operative CXR was free from pneumothorax.  Her chest tube did not exhibit evidence of an air leak and was transitioned to water seal on POD #1.  Follow up CXR showed a small 5% pneumothorax.  Her chest tube was removed on 04/24/17.  Follow up CXR showed stable appearance of pneumothorax,  Mild bilateral atelectasis.  She was hypokalemic and supplemented accordingly.  She has been weaned off oxygen.  She is ambulating  independently.  Her pain is well controlled.  She is tolerating a regular diet.  She is felt medically stable for discharge today.     Significant Diagnostic Studies: nuclear medicine:   1. Solitary, spiculated hypermetabolic left upper lobe nodule without evidence metastatic disease (most consistent with stage IA disease -T1aN0M0). 2. No other  suspicious metabolic activity. 3. Aortic Atherosclerosis (ICD10-I70.0) and Emphysema (ICD10-J43.9).  Pathology:  1. Lymph node, biopsy, Level 7 - ANTHRACOTIC LYMPH NODE. - NO TUMOR IDENTIFIED. 2. Lung, resection (segmental or lobe), Left Upper Lobe - SQUAMOUS CELL CARCINOMA, 2.6 CM. - MARGINS NOT INVOLVED. - TUMOR INVOLVES SUBPLEURAL CONNECTIVE TISSUE. - FOUR BENIGN LYMPH NODES (0/4). 3. Lymph node, biopsy, Level 9 - ANTHRACOTIC LYMPH NODE. - NO TUMOR IDENTIFIED. 4. Lymph node, biopsy, Level 7 #2 - ANTHRACOTIC LYMPH NODE. - NO TUMOR IDENTIFIED. 5. Lymph node, biopsy, Level 7 #3 - ANTHRACOTIC LYMPH NODE. - NO TUMOR IDENTIFIED. 6. Lymph node, biopsy, Level 5 - ANTHRACOTIC LYMPH NODE. - NO TUMOR IDENTIFIED. 7. Lymph node, biopsy, Level 5 #2 - ANTHRACOTIC LYMPH NODE. - NO TUMOR IDENTIFIED. 8. Lymph node, biopsy, Level 5 #3 - ANTHRACOTIC LYMPH NODE - NO TUMOR IDENTIFIED. 9. Lymph node, biopsy, Level 12 - ANTHRACOTIC LYMPH NODE. - NO TUMOR IDENTIFIED. 10. Lymph node, biopsy, Level 12 #2 1 of 5 FINAL for Carlberg, Sharita J ([id number]) Diagnosis(continued) - ANTHRACOTIC LYMPH NODE. - NO TUMOR IDENTIFIED. 11. Lymph node, biopsy, Level 11 - ANTHRACOTIC LYMPH NODE. - NO TUMOR IDENTIFIED. 12. Lymph node, biopsy, Level 11 #2 - ANTHRACOTIC LYMPH NODE. - NO TUMOR IDENTIFIED. 13. Lymph node, biopsy, Level 12 #3 - ANTHRACOTIC LYMPH NODE. - NO TUMOR IDENTIFIED. 14. Lymph node, biopsy, Level 12 #4 - ANTHRACOTIC LYMPH NODE. - NO TUMOR IDENTIFIED. 15. Lymph node, biopsy, Level 11 #3 - ANTHRACOTIC LYMPH NODE. - NO TUMOR IDENTIFIED. 16. Lymph node, biopsy, Level 10 - ANTHRACOTIC LYMPH NODE. - NO TUMOR IDENTIFIED. 17. Lymph node, biopsy, Level 10 #2 - ANTHRACOTIC LYMPH NODE. - NO TUMOR IDENTIFIED.  Treatments: surgery:    Left video-assisted thoracoscopy, thoracoscopic left upper lobectomy, mediastinal lymph node dissection, On-Q local anesthetic catheter  placement  Disposition: 01-Home or Self Care   Discharge medications:   Allergies as of 04/25/2017      Reactions   Penicillins Rash, Other (See Comments)   PATIENT HAS HAD A PCN REACTION WITH IMMEDIATE RASH, FACIAL/TONGUE/THROAT SWELLING, SOB, OR LIGHTHEADEDNESS WITH HYPOTENSION:  #  #  #  YES  #  #  #  Has patient had a PCN reaction causing severe rash involving mucus membranes or skin necrosis: NO Has patient had a PCN reaction that required hospitalization NO Has patient had a PCN reaction occurring within the last 10 years: NO   Codeine Rash      Medication List    TAKE these medications   acetaminophen 500 MG tablet Commonly known as:  TYLENOL Take 2 tablets (1,000 mg total) by mouth every 6 (six) hours as needed.   amLODipine 10 MG tablet Commonly known as:  NORVASC Take 10 mg by mouth daily.   aspirin EC 81 MG tablet Take 81 mg by mouth daily.   atorvastatin 10 MG tablet Commonly known as:  LIPITOR Take 10 mg by mouth daily.   benzonatate 100 MG capsule Commonly known as:  TESSALON Take 100-200 mg by mouth 3 (three) times daily as needed for cough.   hydrALAZINE 25 MG tablet Commonly known as:  APRESOLINE Take 25 mg by mouth 2 (two)  times daily.   isosorbide dinitrate 20 MG tablet Commonly known as:  ISORDIL Take 20 mg by mouth 2 (two) times daily.   losartan 50 MG tablet Commonly known as:  COZAAR Take 50 mg by mouth daily.   meclizine 25 MG tablet Commonly known as:  ANTIVERT Take 25 mg by mouth 2 (two) times daily as needed for dizziness.   metFORMIN 500 MG tablet Commonly known as:  GLUCOPHAGE Take 500 mg by mouth 2 (two) times daily.   metoprolol succinate 25 MG 24 hr tablet Commonly known as:  TOPROL-XL Take 25 mg by mouth every evening.   nitroGLYCERIN 0.4 MG SL tablet Commonly known as:  NITROSTAT Place 0.4 mg under the tongue every 5 (five) minutes as needed for chest pain.   pioglitazone 30 MG tablet Commonly known as:  ACTOS Take  30 mg by mouth every evening.   traMADol 50 MG tablet Commonly known as:  ULTRAM Take 1 tablet (50 mg total) by mouth every 6 (six) hours as needed.   VITAMIN D3 PO Take 1 tablet by mouth daily.            Discharge Care Instructions        Start     Ordered   04/25/17 0000  acetaminophen (TYLENOL) 500 MG tablet  Every 6 hours PRN     04/25/17 0742   04/25/17 0000  traMADol (ULTRAM) 50 MG tablet  Every 6 hours PRN    Question:  Supervising Provider  Answer:  Melrose Nakayama   04/25/17 2458     Follow-up Information    Melrose Nakayama, MD Follow up on 05/10/2017.   Specialty:  Cardiothoracic Surgery Why:  Appointment is at 12:00, please get CXR at 11:30 at [facility] located on first floor of our office building Contact information: [place] [address] [phone]           Signed: Ellwood Handler 04/25/2017, 7:45 AM

## 2017-04-22 NOTE — Progress Notes (Signed)
1 Day Post-Op Procedure(s) (LRB): VIDEO ASSISTED THORACOSCOPY (VATS)/LEFT UPPER LOBECTOMY (Left) Subjective: Some pain from chest tube Nausea last night, feels better this AM  Objective: Vital signs in last 24 hours: Temp:  [96.7 F (35.9 C)-98.6 F (37 C)] 98.6 F (37 C) (09/21 0412) Pulse Rate:  [46-66] 65 (09/21 0600) Cardiac Rhythm: Sinus bradycardia (09/21 0400) Resp:  [6-23] 20 (09/21 0600) BP: (117-170)/(38-82) 143/43 (09/21 0500) SpO2:  [91 %-100 %] 98 % (09/21 0758) Arterial Line BP: (139-178)/(40-62) 173/49 (09/21 0000)  Hemodynamic parameters for last 24 hours:    Intake/Output from previous day: 09/20 0701 - 09/21 0700 In: 3681.3 [P.O.:300; I.V.:3381.3] Out: 1470 [Urine:1125; Blood:100; Chest Tube:245] Intake/Output this shift: Total I/O In: -  Out: 100 [Urine:100]  General appearance: alert, cooperative and no distress Neurologic: intact Heart: regular rate and rhythm Lungs: diminished breath sounds left base Abdomen: normal findings: soft, non-tender no air leak  Lab Results:  Recent Labs  04/19/17 1144 04/22/17 0235  WBC 6.3 7.4  HGB 10.8* 9.2*  HCT 33.5* 29.2*  PLT 253 236   BMET:  Recent Labs  04/19/17 1144 04/22/17 0235  NA 137 138  K 4.4 3.5  CL 110 112*  CO2 18* 19*  GLUCOSE 98 125*  BUN 22* 16  CREATININE 1.27* 0.92  CALCIUM 9.3 8.2*    PT/INR:  Recent Labs  04/19/17 1144  LABPROT 13.5  INR 1.04   ABG    Component Value Date/Time   PHART 7.404 04/19/2017 1143   HCO3 20.1 04/19/2017 1143   TCO2 26 10/22/2016 1549   ACIDBASEDEF 3.8 (H) 04/19/2017 1143   O2SAT 97.5 04/19/2017 1143   CBG (last 3)   Recent Labs  04/21/17 1937 04/21/17 2331 04/22/17 0410  GLUCAP 174* 122* 135*    Assessment/Plan: S/P Procedure(s) (LRB): VIDEO ASSISTED THORACOSCOPY (VATS)/LEFT UPPER LOBECTOMY (Left) Plan for transfer to step-down: see transfer orders  CV- hypertension- restart home meds, PRN hydralazine  RESP s/p  lobectomy  No air leak- Ct to water seal  Good effort with IS and good cough  Change nebs to PRN  RENAL- creatinine and lytes OK  Decrease IVF  Hypokalemia- supplement K  ENDO- CBG trending down  Change to AC/HS CBG and SSI  DVT prophylaxis- SCD + enoxaparin  Ambulate   LOS: 1 day    Melrose Nakayama 04/22/2017

## 2017-04-22 NOTE — Progress Notes (Signed)
Report called to 4E 12  Lucius Conn, RN

## 2017-04-22 NOTE — Progress Notes (Signed)
Patient transferred to 4E12. VSS.  Lucius Conn, RN

## 2017-04-22 NOTE — Care Management Note (Addendum)
Case Management Note  Patient Details  Name: [name] [name] MRN: [id number] Date of Birth: 1934-11-21  Subjective/Objective:         Pt admitted is s/p lobectomy and VATS due to lung CA        Action/Plan:   PTA independent from home with family - pt states she uses cane in the home.  Family will serve as support upon discharge.  Pt would benefit from PT eval when medically stable.  CM will continue to follow for discharge needs   Expected Discharge Date:  04/25/17               Expected Discharge Plan:  Home/Self Care  In-House Referral:     Discharge planning Services  CM Consult  Post Acute Care Choice:    Choice offered to:     DME Arranged:    DME Agency:     HH Arranged:    HH Agency:     Status of Service:     If discussed at [employer], dates discussed:    Additional Comments:  Maryclare Labrador, RN 04/22/2017, 9:58 AM

## 2017-04-23 ENCOUNTER — Inpatient Hospital Stay (HOSPITAL_COMMUNITY): Payer: Medicare Other

## 2017-04-23 LAB — COMPREHENSIVE METABOLIC PANEL
ALBUMIN: 3 g/dL — AB (ref 3.5–5.0)
ALK PHOS: 36 U/L — AB (ref 38–126)
ALT: 7 U/L — AB (ref 14–54)
ANION GAP: 5 (ref 5–15)
AST: 15 U/L (ref 15–41)
BILIRUBIN TOTAL: 0.5 mg/dL (ref 0.3–1.2)
BUN: 14 mg/dL (ref 6–20)
CALCIUM: 8.2 mg/dL — AB (ref 8.9–10.3)
CO2: 20 mmol/L — AB (ref 22–32)
CREATININE: 1.03 mg/dL — AB (ref 0.44–1.00)
Chloride: 112 mmol/L — ABNORMAL HIGH (ref 101–111)
GFR calc Af Amer: 58 mL/min — ABNORMAL LOW (ref >60)
GFR calc non Af Amer: 50 mL/min — ABNORMAL LOW (ref >60)
GLUCOSE: 120 mg/dL — AB (ref 65–99)
Potassium: 4.2 mmol/L (ref 3.5–5.1)
SODIUM: 137 mmol/L (ref 135–145)
TOTAL PROTEIN: 5.5 g/dL — AB (ref 6.5–8.1)

## 2017-04-23 LAB — GLUCOSE, CAPILLARY
Glucose-Capillary: 128 mg/dL — ABNORMAL HIGH (ref 65–99)
Glucose-Capillary: 110 mg/dL — ABNORMAL HIGH (ref 65–99)
Glucose-Capillary: 136 mg/dL — ABNORMAL HIGH (ref 65–99)
Glucose-Capillary: 99 mg/dL (ref 65–99)

## 2017-04-23 LAB — CBC
HEMATOCRIT: 29 % — AB (ref 36.0–46.0)
HEMOGLOBIN: 9 g/dL — AB (ref 12.0–15.0)
MCH: 25.4 pg — ABNORMAL LOW (ref 26.0–34.0)
MCHC: 31 g/dL (ref 30.0–36.0)
MCV: 81.7 fL (ref 78.0–100.0)
Platelets: 209 10*3/uL (ref 150–400)
RBC: 3.55 MIL/uL — AB (ref 3.87–5.11)
RDW: 15.1 % (ref 11.5–15.5)
WBC: 6.5 10*3/uL (ref 4.0–10.5)

## 2017-04-23 MED ORDER — SODIUM CHLORIDE 0.9% FLUSH: 10.0000 mL | INTRAVENOUS | Status: DC | PRN

## 2017-04-23 NOTE — Progress Notes (Addendum)
[address]       [place] [address]             [phone]      2 Days Post-Op Procedure(s) (LRB): VIDEO ASSISTED THORACOSCOPY (VATS)/LEFT UPPER LOBECTOMY (Left) Subjective: Feels well , some minor pain  Objective: Vital signs in last 24 hours: Temp:  [98.1 F (36.7 C)-100.3 F (37.9 C)] 100.3 F (37.9 C) (09/22 0400) Pulse Rate:  [58-135] 69 (09/21 1601) Cardiac Rhythm: Normal sinus rhythm (09/22 0712) Resp:  [15-26] 26 (09/22 0400) BP: (123-156)/(48-132) 152/55 (09/22 0400) SpO2:  [88 %-98 %] 90 % (09/22 0400)  Hemodynamic parameters for last 24 hours:    Intake/Output from previous day: 09/21 0701 - 09/22 0700 In: 2249.2 [P.O.:710; I.V.:1489.2; IV Piggyback:50] Out: 1380 [QIONG:2952; Chest Tube:35] Intake/Output this shift: No intake/output data recorded.  General appearance: alert, cooperative and no distress Heart: regular rate and rhythm Lungs: fairly clear some upper ronchi, improves with cough Abdomen: benign Extremities: no edema or calf tenderness Wound: incis healing well  Lab Results:  Recent Labs  04/22/17 0235 04/23/17 0406  WBC 7.4 6.5  HGB 9.2* 9.0*  HCT 29.2* 29.0*  PLT 236 209   BMET:  Recent Labs  04/22/17 0235 04/23/17 0406  NA 138 137  K 3.5 4.2  CL 112* 112*  CO2 19* 20*  GLUCOSE 125* 120*  BUN 16 14  CREATININE 0.92 1.03*  CALCIUM 8.2* 8.2*    PT/INR: No results for input(s): LABPROT, INR in the last 72 hours. ABG    Component Value Date/Time   PHART 7.404 04/19/2017 1143   HCO3 20.1 04/19/2017 1143   TCO2 26 10/22/2016 1549   ACIDBASEDEF 3.8 (H) 04/19/2017 1143   O2SAT 97.5 04/19/2017 1143   CBG (last 3)   Recent Labs  04/22/17 1224 04/22/17 2112 04/23/17 0625  GLUCAP 139* 130* 128*    Meds Scheduled Meds: . acetaminophen  1,000 mg Oral Q6H   Or  . acetaminophen (TYLENOL) oral liquid 160 mg/5 mL  1,000 mg Oral Q6H  . amLODipine  10 mg Oral Daily  . aspirin EC  81 mg Oral  Daily  . atorvastatin  10 mg Oral Daily  . bisacodyl  10 mg Oral Daily  . enoxaparin (LOVENOX) injection  40 mg Subcutaneous Q24H  . fentaNYL   Intravenous Q4H  . hydrALAZINE  25 mg Oral BID  . insulin aspart  0-15 Units Subcutaneous TID WC  . isosorbide dinitrate  20 mg Oral BID  . losartan  50 mg Oral Daily  . metFORMIN  500 mg Oral BID WC  . metoprolol succinate  25 mg Oral QPM  . pantoprazole  40 mg Oral Daily  . senna-docusate  1 tablet Oral QHS   Continuous Infusions: . bupivacaine ON-Q pain pump    . dextrose 5 % and 0.9% NaCl 50 mL/hr at 04/23/17 0439  . potassium chloride     PRN Meds:.diphenhydrAMINE **OR** diphenhydrAMINE, hydrALAZINE, levalbuterol, naloxone **AND** sodium chloride flush, ondansetron (ZOFRAN) IV, potassium chloride, sodium chloride flush  Xrays Dg Chest Port 1 View  Result Date: 04/22/2017 CLINICAL DATA:  Status post lobectomy on the left with chest tube in place. EXAM: PORTABLE CHEST 1 VIEW COMPARISON:  April 21, 2017 FINDINGS: Chest tube is noted on the left. Central catheter tip is at the junction of the left innominate vein and superior vena cava. There is a small left apical pneumothorax without tension component. There is atelectatic change  in the left base. There is trace interstitial edema. There is cardiomegaly with mild pulmonary venous hypertension. No adenopathy. No bone lesions. There is aortic atherosclerosis. IMPRESSION: Small left apical pneumothorax without tension component. Chest tube on the left remains. Central catheter position unchanged. There is felt to be a degree of congestive heart failure with mild interstitial edema. Stable cardiomegaly. There is aortic atherosclerosis. Aortic Atherosclerosis (ICD10-I70.0). Electronically Signed   By: Lowella Grip III M.D.   On: 04/22/2017 08:09   Dg Chest Port 1 View  Result Date: 04/21/2017 CLINICAL DATA:  Status post left upper lobectomy. EXAM: PORTABLE CHEST 1 VIEW COMPARISON:  Chest  x-ray dated April 19, 2017. FINDINGS: Left internal jugular central venous catheter with the tip projecting over the proximal SVC. Left chest tube in appropriate position. The cardiomediastinal silhouette is borderline enlarged. Postsurgical changes related to left upper lobectomy with expected volume loss in the left lung. No pneumothorax or pleural effusion. No acute osseous abnormality. IMPRESSION: 1. Postsurgical changes related to left upper lobectomy. No pneumothorax. 2. Left internal jugular central venous catheter with the tip likely projecting over the proximal SVC. Electronically Signed   By: Titus Dubin M.D.   On: 04/21/2017 13:34    Assessment/Plan: S/P Procedure(s) (LRB): VIDEO ASSISTED THORACOSCOPY (VATS)/LEFT UPPER LOBECTOMY (Left)   1 doing well 2 hemodyn stable in sinus rhythm, some HTN, back on home meds- follow 3 pulm toilet, wean O2 4 minimal CT drainage, no air leak on H2O seal, CXR is stable without pntx-  d/c chest tube 5 H/H stable , was anemic preop 6 renal fxn is stable 7 Tmax 100.3, no leukocytosis- monitor 8 path TNM code: pT1b , pN0, squamous cell carcinoma 9 sugars fairly well controlled, restart home actos dose and cont metformin/ SSI  LOS: 2 days    GOLD,WAYNE E 04/23/2017  I have seen and examined the patient and agree with the assessment and plan as outlined.  Rexene Alberts, MD 04/23/2017 12:14 PM

## 2017-04-23 NOTE — Progress Notes (Signed)
Ambulated  To outside of room door and back to bed using walker, denies any pain or SOB,  Chest tube intact, continues use of PCA fentanyl.  O2 at 2 l/m  Sat  98%     Her B/p  Upon going back to bed  136/104, other than being tired says she feels okay.  Mervyn Skeeters, RN

## 2017-04-24 ENCOUNTER — Inpatient Hospital Stay (HOSPITAL_COMMUNITY): Payer: Medicare Other

## 2017-04-24 LAB — GLUCOSE, CAPILLARY
Glucose-Capillary: 132 mg/dL — ABNORMAL HIGH (ref 65–99)
Glucose-Capillary: 119 mg/dL — ABNORMAL HIGH (ref 65–99)

## 2017-04-24 MED ORDER — PIOGLITAZONE HCL 30 MG PO TABS
30.0000 mg | ORAL_TABLET | Freq: Every evening | ORAL | Status: DC
  Administered 2017-04-24: 30 mg via ORAL
  Filled 2017-04-24 (×2): qty 1

## 2017-04-24 NOTE — Progress Notes (Signed)
dc'ed chest tube pt. tolerated well

## 2017-04-24 NOTE — Progress Notes (Addendum)
[address]       [place] [address]             [phone]      3 Days Post-Op Procedure(s) (LRB): VIDEO ASSISTED THORACOSCOPY (VATS)/LEFT UPPER LOBECTOMY (Left) Subjective: Feels pretty well, some productive cough Chest tube was not removed yesterday  Objective: Vital signs in last 24 hours: Temp:  [98.1 F (36.7 C)-98.9 F (37.2 C)] 98.1 F (36.7 C) (09/23 0526) Pulse Rate:  [62-73] 62 (09/22 2106) Cardiac Rhythm: Normal sinus rhythm (09/23 0809) Resp:  [17-25] 25 (09/23 0526) BP: (127-165)/(50-104) 165/84 (09/23 0526) SpO2:  [94 %-99 %] 95 % (09/23 0526) FiO2 (%):  [2 %] 2 % (09/22 2106)  Hemodynamic parameters for last 24 hours:    Intake/Output from previous day: 09/22 0701 - 09/23 0700 In: 690 [P.O.:680; I.V.:10] Out: 350 [Urine:350] Intake/Output this shift: Total I/O In: -  Out: 250 [Urine:250]  General appearance: alert, cooperative and no distress Heart: regular rate and rhythm Lungs: min dim in bases Abdomen: benign Extremities: no calf tenderness or edema Wound: incis healing well  Lab Results:  Recent Labs  04/22/17 0235 04/23/17 0406  WBC 7.4 6.5  HGB 9.2* 9.0*  HCT 29.2* 29.0*  PLT 236 209   BMET:  Recent Labs  04/22/17 0235 04/23/17 0406  NA 138 137  K 3.5 4.2  CL 112* 112*  CO2 19* 20*  GLUCOSE 125* 120*  BUN 16 14  CREATININE 0.92 1.03*  CALCIUM 8.2* 8.2*    PT/INR: No results for input(s): LABPROT, INR in the last 72 hours. ABG    Component Value Date/Time   PHART 7.404 04/19/2017 1143   HCO3 20.1 04/19/2017 1143   TCO2 26 10/22/2016 1549   ACIDBASEDEF 3.8 (H) 04/19/2017 1143   O2SAT 97.5 04/19/2017 1143   CBG (last 3)   Recent Labs  04/23/17 1133 04/23/17 2114 04/24/17 0525  GLUCAP 136* 99 132*    Meds Scheduled Meds: . acetaminophen  1,000 mg Oral Q6H   Or  . acetaminophen (TYLENOL) oral liquid 160 mg/5 mL  1,000 mg Oral Q6H  . amLODipine  10 mg Oral Daily  . aspirin EC  81  mg Oral Daily  . atorvastatin  10 mg Oral Daily  . bisacodyl  10 mg Oral Daily  . enoxaparin (LOVENOX) injection  40 mg Subcutaneous Q24H  . fentaNYL   Intravenous Q4H  . hydrALAZINE  25 mg Oral BID  . insulin aspart  0-15 Units Subcutaneous TID WC  . isosorbide dinitrate  20 mg Oral BID  . losartan  50 mg Oral Daily  . metFORMIN  500 mg Oral BID WC  . metoprolol succinate  25 mg Oral QPM  . pantoprazole  40 mg Oral Daily  . senna-docusate  1 tablet Oral QHS   Continuous Infusions: . bupivacaine ON-Q pain pump    . dextrose 5 % and 0.9% NaCl 50 mL/hr at 04/23/17 2144  . potassium chloride     PRN Meds:.diphenhydrAMINE **OR** diphenhydrAMINE, hydrALAZINE, levalbuterol, naloxone **AND** sodium chloride flush, ondansetron (ZOFRAN) IV, potassium chloride, sodium chloride flush  Xrays Dg Chest Port 1 View  Result Date: 04/24/2017 CLINICAL DATA:  Status post lobectomy of left upper lobe. EXAM: PORTABLE CHEST 1 VIEW COMPARISON:  April 23, 2017 FINDINGS: The left chest tube and left central line are stable. A tiny left-sided pneumothorax remains, similar to slightly more prominent in the interval. Air is seen in the left chest  wall, mildly increased in the interval. Stable cardiomegaly. The hila and mediastinum are unchanged. Mild reticular opacities in the lungs may represent pulmonary venous congestion. Atelectasis in the left base. IMPRESSION: 1. There is a tiny left pneumothorax, slightly more conspicuous in the interval. A left chest tube remains. 2. Reticular/interstitial opacities suggest pulmonary venous congestion/mild edema. 3. Atelectasis in the left base has mildly improved. Electronically Signed   By: Dorise Bullion III M.D   On: 04/24/2017 07:51   Dg Chest Port 1 View  Result Date: 04/23/2017 CLINICAL DATA:  Post left upper lobectomy. EXAM: PORTABLE CHEST 1 VIEW COMPARISON:  04/22/2017 and 12/23/2016 FINDINGS: Stable position of the left chest tube. There may be a tiny left  apical pneumothorax which is unchanged. Small amount of subcutaneous gas in left lower chest. Prominent interstitial lung markings appear to be chronic but there may be mild interstitial edema. Stable densities at the left lung base compatible with volume loss. Heart size upper limits of normal and stable. Left jugular central venous catheter tip at the junction of the SVC and the left innominate vein. Catheter tip position is stable. IMPRESSION: Stable position of the left chest tube. Probable tiny left apical pneumothorax which has minimally changed. Left basilar densities compatible with volume loss. Slightly prominent interstitial lung markings may represent mild edema. Stable position of the central venous catheter as described. Electronically Signed   By: Markus Daft M.D.   On: 04/23/2017 08:40    Assessment/Plan: S/P Procedure(s) (LRB): VIDEO ASSISTED THORACOSCOPY (VATS)/LEFT UPPER LOBECTOMY (Left)  1 doing well  2 d/c chest tube 3 push rehab and pulm toilet 4 d/c pca 5 hopefully home in am    LOS: 3 days    GOLD,WAYNE E 04/24/2017  I have seen and examined the patient and agree with the assessment and plan as outlined.  Rexene Alberts, MD 04/24/2017 11:41 AM

## 2017-04-24 NOTE — Progress Notes (Signed)
Wasted 269mcg of fentany with stephine dherill

## 2017-04-25 ENCOUNTER — Inpatient Hospital Stay (HOSPITAL_COMMUNITY): Payer: Medicare Other

## 2017-04-25 LAB — GLUCOSE, CAPILLARY
Glucose-Capillary: 103 mg/dL — ABNORMAL HIGH (ref 65–99)
Glucose-Capillary: 94 mg/dL (ref 65–99)

## 2017-04-25 LAB — POCT I-STAT 3, ART BLOOD GAS (G3+)
Acid-base deficit: 5 mmol/L — ABNORMAL HIGH (ref 0.0–2.0)
BICARBONATE: 20 mmol/L (ref 20.0–28.0)
O2 Saturation: 91 %
PO2 ART: 65 mmHg — AB (ref 83.0–108.0)
TCO2: 21 mmol/L — AB (ref 22–32)
pCO2 arterial: 35.9 mmHg (ref 32.0–48.0)
pH, Arterial: 7.354 (ref 7.350–7.450)

## 2017-04-25 MED ORDER — ACETAMINOPHEN 500 MG PO TABS: 1000.0000 mg | ORAL_TABLET | Freq: Four times a day (QID) | ORAL | 0 refills | Status: DC | PRN

## 2017-04-25 MED ORDER — TRAMADOL HCL 50 MG PO TABS: 50.0000 mg | ORAL_TABLET | Freq: Four times a day (QID) | ORAL | 0 refills | Status: DC | PRN

## 2017-04-25 NOTE — Plan of Care (Signed)
Problem: Health Behavior/Discharge Planning: Goal: Ability to manage health-related needs will improve Outcome: Completed/Met Date Met: 04/25/17 Case manager to arrange home O2 for patient.   Problem: Respiratory: Goal: Ability to maintain normal oxygenation or baseline will improve Outcome: Completed/Met Date Met: 04/25/17 Patient will be going home with O2.

## 2017-04-25 NOTE — Progress Notes (Signed)
Patient to be discharged to home. Central line removed per protocol. Patient instructed to remain in bed for 30 minutes after removal. Discharge instructions reviewed with patient, including medications and post op incisional care. Follow up appt has already been arranged and patient aware.   Joellen Jersey, RN

## 2017-04-25 NOTE — Progress Notes (Addendum)
SATURATION QUALIFICATIONS: (This note is used to comply with regulatory documentation for home oxygen)  Patient Saturations on Room Air at Rest = 93%  Patient Saturations on Room Air while Ambulating = 82%  Patient Saturations on 2 Liters of oxygen while Ambulating = 97%  Please briefly explain why patient needs home oxygen: Patient becomes SOB and her oxygen levels drop while ambulating on room air. Methods tried and failed, pt still requires oxygen.   Joellen Jersey, RN

## 2017-04-25 NOTE — Progress Notes (Signed)
Paged Katheran Awe to request orders for home O2. In OR - left message and await return call.   Fritz Pickerel, RN

## 2017-04-25 NOTE — Care Management Important Message (Signed)
Important Message  Patient Details  Name: [name] [name] MRN: [id number] Date of Birth: 23-Nov-1934   Medicare Important Message Given:  Yes    [name] [name] 04/25/2017, 10:12 AM

## 2017-04-25 NOTE — Care Management Note (Signed)
Case Management Note Original Note Created Maryclare Labrador, RN 04/22/2017, 9:58 AM  Patient Details  Name: [name] [name] MRN: [id number] Date of Birth: Dec 02, 1934  Subjective/Objective:         Pt admitted is s/p lobectomy and VATS due to lung CA        Action/Plan:   PTA independent from home with family - pt states she uses cane in the home.  Family will serve as support upon discharge.  Pt would benefit from PT eval when medically stable.  CM will continue to follow for discharge needs   Expected Discharge Date:  04/25/17               Expected Discharge Plan:  Home/Self Care  In-House Referral:     Discharge planning Services  CM Consult  Post Acute Care Choice:  Durable Medical Equipment Choice offered to:  Patient  DME Arranged:  Oxygen DME Agency:  [place]:  NA [place] Agency:  NA  Status of Service:  Completed, signed off  If discussed at [place] of Stay Meetings, dates discussed:     Discharge Disposition: home/self care   Additional Comments:  04/25/17- 1245- Glenford Garis RN, CM-  Pt for d/c home today- will need home 02- order has been placed and have notified Jermaine with [facility] for DME needs- portable tank to be delivered to room prior to discharge.  No other CM needs noted for discharge.   Dahlia Client [place], RN 04/25/2017, 12:43 PM [phone]

## 2017-04-25 NOTE — Progress Notes (Addendum)
      [address]       [place] [address]             [phone]      4 Days Post-Op Procedure(s) (LRB): VIDEO ASSISTED THORACOSCOPY (VATS)/LEFT UPPER LOBECTOMY (Left)   Subjective:  Patient states feeling pretty good.  Ready to go home.  +ambulation  + BM  Objective: Vital signs in last 24 hours: Temp:  [98.5 F (36.9 C)-98.7 F (37.1 C)] 98.5 F (36.9 C) (09/24 0517) Pulse Rate:  [72] 72 (09/23 2100) Cardiac Rhythm: Normal sinus rhythm (09/23 2056) Resp:  [20-26] 26 (09/24 0517) BP: (136-139)/(6-57) 136/6 (09/24 0517) SpO2:  [94 %-99 %] 99 % (09/24 0517)  Intake/Output from previous day: 09/23 0701 - 09/24 0700 In: 240 [P.O.:240] Out: 1050 [Urine:1050]  General appearance: alert, cooperative and no distress Heart: regular rate and rhythm Lungs: diminished breath sounds bibasilar Abdomen: soft, non-tender; bowel sounds normal; no masses,  no organomegaly Extremities: extremities normal, atraumatic, no cyanosis or edema Wound: clean and dry  Lab Results:  Recent Labs  04/23/17 0406  WBC 6.5  HGB 9.0*  HCT 29.0*  PLT 209   BMET:  Recent Labs  04/23/17 0406  NA 137  K 4.2  CL 112*  CO2 20*  GLUCOSE 120*  BUN 14  CREATININE 1.03*  CALCIUM 8.2*    PT/INR: No results for input(s): LABPROT, INR in the last 72 hours. ABG    Component Value Date/Time   PHART 7.404 04/19/2017 1143   HCO3 20.1 04/19/2017 1143   TCO2 26 10/22/2016 1549   ACIDBASEDEF 3.8 (H) 04/19/2017 1143   O2SAT 97.5 04/19/2017 1143   CBG (last 3)   Recent Labs  04/24/17 0525 04/24/17 1648 04/25/17 0726  GLUCAP 132* 119* 103*    Assessment/Plan: S/P Procedure(s) (LRB): VIDEO ASSISTED THORACOSCOPY (VATS)/LEFT UPPER LOBECTOMY (Left)  1. Chest tube- removed yesterday, stable appearance of 5% pneumothorax, mild bilateral atelectasis, patient instructed to continue IS use at home 2. Pulm- no acute issues, sats good 3. CV- NSR, BP stable- continue home  anti-hypertensive agents 4. DM-sugars controlled 5. Dispo- patient stable, will d/c home today   LOS: 4 days    BARRETT, ERIN 04/25/2017 Patient seen and examined, agree with above Desaturates with ambulation. Will need short term home O2 Path- T1bN0- stage IA Home later today  Steven C. Roxan Hockey, MD [facility] [phone]

## 2017-05-09 ENCOUNTER — Other Ambulatory Visit: Payer: Self-pay | Admitting: Thoracic Surgery (Cardiothoracic Vascular Surgery)

## 2017-05-09 DIAGNOSIS — Z902 Acquired absence of lung [part of]: Secondary | ICD-10-CM

## 2017-05-10 ENCOUNTER — Ambulatory Visit (INDEPENDENT_AMBULATORY_CARE_PROVIDER_SITE_OTHER): Payer: Self-pay | Admitting: Thoracic Surgery (Cardiothoracic Vascular Surgery)

## 2017-05-10 ENCOUNTER — Ambulatory Visit
Admission: RE | Admit: 2017-05-10 | Discharge: 2017-05-10 | Disposition: A | Discharge status: Home / Self Care | Payer: Medicare Other | Source: Ambulatory Visit | Attending: Thoracic Surgery (Cardiothoracic Vascular Surgery) | Admitting: Thoracic Surgery (Cardiothoracic Vascular Surgery)

## 2017-05-10 ENCOUNTER — Encounter: Payer: Self-pay | Admitting: Thoracic Surgery (Cardiothoracic Vascular Surgery)

## 2017-05-10 VITALS — BP 140/74 | HR 83 | Resp 16 | Ht 69.0 in | Wt 195.0 lb

## 2017-05-10 DIAGNOSIS — C3492 Malignant neoplasm of unspecified part of left bronchus or lung: Secondary | ICD-10-CM

## 2017-05-10 DIAGNOSIS — Z902 Acquired absence of lung [part of]: Secondary | ICD-10-CM

## 2017-05-10 MED ORDER — PREDNISONE 10 MG (21) PO TBPK: ORAL_TABLET | ORAL | 0 refills | Status: DC

## 2017-05-10 MED ORDER — TRAMADOL HCL 50 MG PO TABS: 50.0000 mg | ORAL_TABLET | Freq: Four times a day (QID) | ORAL | 0 refills | Status: DC | PRN

## 2017-05-10 NOTE — Addendum Note (Signed)
Addended by: Amado Coe on: 05/10/2017 12:47 PM   Modules accepted: Orders

## 2017-05-10 NOTE — Progress Notes (Signed)
[address]       [place] [address]             [phone]    HPI: [name] returns today for a scheduled follow up visit  [name] is a 82 year old woman with a history of tobacco abuse who presented with right anterior chest wall pain in June. Chest x-ray suggests a possible pneumonia. A follow-up CT was done, which showed a 1.7 cm spiculated mass in the left upper lobe. She had bronchoscopy and endobronchial ultrasound which showed no mediastinal node involvement. Biopsy showed squamous cell carcinoma. She had a thoracoscopic left upper lobectomy on 04/21/2017. The nodule turned out to be a T1b N0, stage IA squamous cell carcinoma.  She was desaturating with ambulation and went home on 2 L nasal cannula for use while ambulating  She is having some incisional pain. She's been alternating extra strength Tylenol with tramadol. She is using tramadol 2 or 3 times a day. She complains of her nose being stopped up from the oxygen. She has not had any other problems with her breathing.  Past Medical History:  Diagnosis Date  . Anginal pain ([place])   . Arthritis   . Asthma   . Coronary artery disease   . Diabetes mellitus   . Dyspnea   . Early cataracts, bilateral   . GERD (gastroesophageal reflux disease)    occ  . Hypertension   . Lung cancer ([place])   . Mass of upper lobe of left lung    mediastinal adenopathy  . Palpitations   . Pneumonia   . Stroke [facility])    mini stroke  . Wears dentures   . Wears glasses     Current Outpatient Prescriptions  Medication Sig Dispense Refill  . acetaminophen (TYLENOL) 500 MG tablet Take 2 tablets (1,000 mg total) by mouth every 6 (six) hours as needed. 30 tablet 0  . amLODipine (NORVASC) 10 MG tablet Take 10 mg by mouth daily.      Marland Kitchen aspirin EC 81 MG tablet Take 81 mg by mouth daily.      Marland Kitchen atorvastatin (LIPITOR) 10 MG tablet Take 10 mg by mouth daily.  5  . benzonatate (TESSALON) 100 MG capsule Take 100-200 mg by  mouth 3 (three) times daily as needed for cough.    . Cholecalciferol (VITAMIN D3 PO) Take 1 tablet by mouth daily.    . hydrALAZINE (APRESOLINE) 25 MG tablet Take 25 mg by mouth 2 (two) times daily.     . isosorbide dinitrate (ISORDIL) 20 MG tablet Take 20 mg by mouth 2 (two) times daily.     Marland Kitchen losartan (COZAAR) 50 MG tablet Take 50 mg by mouth daily.    . meclizine (ANTIVERT) 25 MG tablet Take 25 mg by mouth 2 (two) times daily as needed for dizziness.    . metFORMIN (GLUCOPHAGE) 500 MG tablet Take 500 mg by mouth 2 (two) times daily.     . metoprolol succinate (TOPROL-XL) 25 MG 24 hr tablet Take 25 mg by mouth every evening.     . nitroGLYCERIN (NITROSTAT) 0.4 MG SL tablet Place 0.4 mg under the tongue every 5 (five) minutes as needed for chest pain.    . pioglitazone (ACTOS) 30 MG tablet Take 30 mg by mouth every evening.     . traMADol (ULTRAM) 50 MG tablet Take 1 tablet (50 mg total) by mouth every 6 (six) hours as needed. 30 tablet 0  No current facility-administered medications for this visit.     Physical Exam BP 140/74 (BP Location: Right Arm, Patient Position: Sitting, Cuff Size: Large)   Pulse 83   Resp 16   Ht 5\' 9"  (1.753 m)   Wt 195 lb (88.5 kg)   SpO2 96% Comment: ON RA  BMI 28.50 kg/m  82 year old woman in no acute distress Alert and oriented 3 with no focal deficits Lungs diminished at left base, otherwise clear, no rales or wheezing Cardiac regular rate and rhythm normal S1 and S2 Incisions healing well No peripheral edema  Diagnostic Tests: CHEST  2 VIEW  COMPARISON:  Chest x-ray of April 25, 2017  FINDINGS: There is persistent volume loss on the left. There is a moderate sized left pleural effusion. The right lung is well-expanded. The interstitial markings of both lungs are increased though stable. The heart is normal in size. The pulmonary vascularity is less engorged and more distinct. The trachea is midline. There is calcification in the wall  of the aortic arch. The gas pattern in the upper abdomen is normal. There is mild multilevel degenerative disc disease of the thoracic spine.  IMPRESSION: Interval accumulation of a moderate size left pleural effusion. Interval improvement in the pulmonary interstitium with decreased interstitial edema.  Thoracic aortic atherosclerosis.   Electronically Signed   By: David  Martinique M.D.   On: 05/10/2017 11:59 I personally reviewed the chest x-ray images. There is some volume loss of the left base, some of this is likely effusion  Impression: [name] is a 82 year old former smoker with a series quit August 2018) who had a thoracoscopic left upper lobectomy and node dissection for stage IA squamous cell carcinoma almost 3 weeks ago. Overall she is doing well considering her age and comorbidities. She does still have some incisional pain, which is not surprising. She has been tapering off her tramadol use, but is almost out of those is requesting a another prescription for that. I gave her prescription for tramadol 50 mg by mouth every 6 hours when necessary, 30 tablets, no refills  She is on oxygen. I suspect she doesn't need that anymore, but will work with her home health agency to see if we can wean her off that.  She is not to drive until she no longer is taking the tramadol.  There is a question of a left pleural effusion. There are some of the volume loss is due to lobectomy. I'm going to give her a steroid taper and then repeat a chest x-ray about 3 weeks and decide at that time if she needs a thoracentesis.  She saw Dr. Tammi Klippel preoperatively but has not seen a medical oncologist. I will arrange for her to see Dr. Julien Nordmann in our multidisciplinary clinic.  Plan: Prednisone taper Wean oxygen Tramadol 50 mg by mouth every 6 hours when necessary, 30 tablets, no refills Return in 3 weeks with PA and lateral chest x-ray Referral to our multidisciplinary thoracic oncology  clinic to see oncology.  Melrose Nakayama, MD [facility] [phone]

## 2017-05-11 ENCOUNTER — Other Ambulatory Visit: Payer: Self-pay

## 2017-05-11 DIAGNOSIS — C3412 Malignant neoplasm of upper lobe, left bronchus or lung: Secondary | ICD-10-CM

## 2017-05-16 ENCOUNTER — Telehealth: Payer: Self-pay | Admitting: *Deleted

## 2017-05-16 DIAGNOSIS — C3412 Malignant neoplasm of upper lobe, left bronchus or lung: Secondary | ICD-10-CM

## 2017-05-16 NOTE — Telephone Encounter (Signed)
Oncology Nurse Navigator Documentation  Oncology Nurse Navigator Flowsheets 05/16/2017  Navigator Location [facility]  Referral date to RadOnc/MedOnc 05/16/2017  Navigator Encounter Type Telephone/I received referral. I updated Dr. Julien Nordmann on referral. He states she can be seen in Nov. I called patient and gave her an appt for [place] on 06/02/17.  She verbalized understanding of appt time and place.   Telephone Outgoing Call  Treatment Phase Other  Barriers/Navigation Needs Coordination of Care  Interventions Coordination of Care  Coordination of Care Appts  Acuity Level 2  Time Spent with Patient 30

## 2017-05-20 ENCOUNTER — Telehealth: Payer: Self-pay | Admitting: *Deleted

## 2017-05-20 NOTE — Telephone Encounter (Signed)
[name] [name] had a L VATS,  LULobectomy 04/21/17. She has called this morning relating that she vomited green bile. She said she vomited right after surgery also. This has been the only times. I told her to limit herself to liquids until her stomach settles. She could try Mylanta. If she continues she should see her PCP. She agrees. Her bowels are regular and there is no abdominal pain.

## 2017-05-30 ENCOUNTER — Other Ambulatory Visit: Payer: Self-pay | Admitting: Thoracic Surgery (Cardiothoracic Vascular Surgery)

## 2017-05-30 DIAGNOSIS — R911 Solitary pulmonary nodule: Secondary | ICD-10-CM

## 2017-05-31 ENCOUNTER — Telehealth: Payer: Self-pay

## 2017-05-31 ENCOUNTER — Ambulatory Visit (INDEPENDENT_AMBULATORY_CARE_PROVIDER_SITE_OTHER): Payer: Self-pay | Admitting: Thoracic Surgery (Cardiothoracic Vascular Surgery)

## 2017-05-31 ENCOUNTER — Encounter: Payer: Self-pay | Admitting: Thoracic Surgery (Cardiothoracic Vascular Surgery)

## 2017-05-31 ENCOUNTER — Ambulatory Visit
Admission: RE | Admit: 2017-05-31 | Discharge: 2017-05-31 | Disposition: A | Discharge status: Home / Self Care | Payer: Medicare Other | Source: Ambulatory Visit | Attending: Thoracic Surgery (Cardiothoracic Vascular Surgery) | Admitting: Thoracic Surgery (Cardiothoracic Vascular Surgery)

## 2017-05-31 VITALS — BP 145/75 | HR 67 | Wt 194.0 lb

## 2017-05-31 DIAGNOSIS — C3492 Malignant neoplasm of unspecified part of left bronchus or lung: Secondary | ICD-10-CM

## 2017-05-31 DIAGNOSIS — R911 Solitary pulmonary nodule: Secondary | ICD-10-CM

## 2017-05-31 NOTE — Telephone Encounter (Signed)
Patient called to verify scheduled apointment on 11/1. per 10/30 phone que

## 2017-05-31 NOTE — Progress Notes (Signed)
[address]       [place] [address]             [phone]     HPI: [name] [name] returns for scheduled follow-up visit  She is a 82 year old woman who had a thoracoscopic left upper lobectomy for stage IA squamous cell carcinoma on 04/21/2017.  She had an uncomplicated postoperative course but did go home on 2 L nasal cannula oxygen for use while ambulating.  I saw her back in the office on 05/10/2017.  At that time she was complaining of some incisional pain, but otherwise was doing well.  She did have a small effusion and was given a prednisone taper.  Her pain is much improved.  She is no longer using tramadol.  She wants to discontinue the oxygen.  She complains of a dry cough.  She complains that her "taste buds are messed up."  Past Medical History:  Diagnosis Date  . Anginal pain ([place])   . Arthritis   . Asthma   . Coronary artery disease   . Diabetes mellitus   . Dyspnea   . Early cataracts, bilateral   . GERD (gastroesophageal reflux disease)    occ  . Hypertension   . Lung cancer ([place])   . Mass of upper lobe of left lung    mediastinal adenopathy  . Palpitations   . Pneumonia   . Stroke [facility])    mini stroke  . Wears dentures   . Wears glasses     Current Outpatient Prescriptions  Medication Sig Dispense Refill  . acetaminophen (TYLENOL) 500 MG tablet Take 2 tablets (1,000 mg total) by mouth every 6 (six) hours as needed. 30 tablet 0  . amLODipine (NORVASC) 10 MG tablet Take 10 mg by mouth daily.      Marland Kitchen aspirin EC 81 MG tablet Take 81 mg by mouth daily.      Marland Kitchen atorvastatin (LIPITOR) 10 MG tablet Take 10 mg by mouth daily.  5  . Cholecalciferol (VITAMIN D3 PO) Take 1 tablet by mouth daily.    . hydrALAZINE (APRESOLINE) 25 MG tablet Take 25 mg by mouth 2 (two) times daily.     . isosorbide dinitrate (ISORDIL) 20 MG tablet Take 20 mg by mouth 2 (two) times daily.     Marland Kitchen losartan (COZAAR) 50 MG tablet Take 50 mg by mouth daily.    .  meclizine (ANTIVERT) 25 MG tablet Take 25 mg by mouth 2 (two) times daily as needed for dizziness.    . metFORMIN (GLUCOPHAGE) 500 MG tablet Take 500 mg by mouth 2 (two) times daily.     . metoprolol succinate (TOPROL-XL) 25 MG 24 hr tablet Take 25 mg by mouth every evening.     . nitroGLYCERIN (NITROSTAT) 0.4 MG SL tablet Place 0.4 mg under the tongue every 5 (five) minutes as needed for chest pain.    . pioglitazone (ACTOS) 30 MG tablet Take 30 mg by mouth every evening.     . TRADJENTA 5 MG TABS tablet TK 1 T PO  QD  1  . traMADol (ULTRAM) 50 MG tablet Take 1 tablet (50 mg total) by mouth every 6 (six) hours as needed. 30 tablet 0   No current facility-administered medications for this visit.     Physical Exam BP (!) 145/75   Pulse 67   Wt 194 lb (88 kg)   SpO2 99%   BMI 28.35 kg/m  82 year old woman in  no acute distress Alert and oriented x3 with no focal deficits Lungs diminished at left base, otherwise clear Incisions well-healed Cardiac regular rate and rhythm normal S1 and S2  Diagnostic Tests: CHEST  2 VIEW  COMPARISON:  05/10/2017 04/24/2017.  PET-CT 01/12/2017.  FINDINGS: Mediastinum hilar structures normal. Cardiomegaly with normal pulmonary vascularity. Mild left base atelectasis and left-sided pleural effusion, improved from prior exam. Stable chronic interstitial changes. Stable elevation left hemidiaphragm. No pneumothorax.  IMPRESSION: Mild left base atelectasis and left-sided pleural effusion, improved from prior exam. Stable elevation left hemidiaphragm.   Electronically Signed   By: Marcello Moores  Register   On: 05/31/2017 09:44 I personally reviewed the chest x-ray.  It is a stable postoperative exam.  Impression: [name] [name] is an 82 year old woman who had a left upper lobectomy for stage IA squamous cell carcinoma about 5 weeks ago.  She is doing well at this time.  She has minimal discomfort.  She does have a dry cough.  She may use an  over-the-counter cough medication for that as needed.  Her sense of taste is altered.  That is likely an effect from the anesthesia.  That should improve with time.  I recommended that she try eating with plastic utensils to see if that makes a difference in the meantime.  She sees Dr. Julien Nordmann on Thursday.  He will do the primary follow-up.  Plan: Return in 9 months with PA and lateral chest x-ray  Melrose Nakayama, MD [facility] [phone]

## 2017-06-02 ENCOUNTER — Ambulatory Visit: Payer: Medicare Other | Attending: Internal Medicine | Admitting: Physical Therapy

## 2017-06-02 ENCOUNTER — Telehealth: Payer: Self-pay | Admitting: *Deleted

## 2017-06-02 ENCOUNTER — Telehealth: Payer: Self-pay | Admitting: Internal Medicine

## 2017-06-02 ENCOUNTER — Other Ambulatory Visit: Payer: Medicare Other

## 2017-06-02 ENCOUNTER — Encounter: Payer: Self-pay | Admitting: Internal Medicine

## 2017-06-02 ENCOUNTER — Ambulatory Visit: Payer: Medicare Other | Admitting: Internal Medicine

## 2017-06-02 ENCOUNTER — Other Ambulatory Visit (HOSPITAL_BASED_OUTPATIENT_CLINIC_OR_DEPARTMENT_OTHER): Payer: Medicare Other

## 2017-06-02 ENCOUNTER — Ambulatory Visit (HOSPITAL_BASED_OUTPATIENT_CLINIC_OR_DEPARTMENT_OTHER): Payer: Medicare Other | Admitting: Internal Medicine

## 2017-06-02 VITALS — BP 153/53 | HR 83 | Temp 98.6°F | Resp 20 | Ht 69.0 in | Wt 195.0 lb

## 2017-06-02 DIAGNOSIS — C3492 Malignant neoplasm of unspecified part of left bronchus or lung: Secondary | ICD-10-CM | POA: Insufficient documentation

## 2017-06-02 DIAGNOSIS — R262 Difficulty in walking, not elsewhere classified: Secondary | ICD-10-CM | POA: Diagnosis present

## 2017-06-02 DIAGNOSIS — C3490 Malignant neoplasm of unspecified part of unspecified bronchus or lung: Secondary | ICD-10-CM

## 2017-06-02 DIAGNOSIS — R293 Abnormal posture: Secondary | ICD-10-CM | POA: Diagnosis present

## 2017-06-02 DIAGNOSIS — I10 Essential (primary) hypertension: Secondary | ICD-10-CM

## 2017-06-02 DIAGNOSIS — Z483 Aftercare following surgery for neoplasm: Secondary | ICD-10-CM | POA: Insufficient documentation

## 2017-06-02 DIAGNOSIS — C3412 Malignant neoplasm of upper lobe, left bronchus or lung: Secondary | ICD-10-CM

## 2017-06-02 LAB — CBC WITH DIFFERENTIAL/PLATELET
BASO%: 0.2 % (ref 0.0–2.0)
Basophils Absolute: 0 10*3/uL (ref 0.0–0.1)
EOS ABS: 0.4 10*3/uL (ref 0.0–0.5)
EOS%: 7.5 % — ABNORMAL HIGH (ref 0.0–7.0)
HCT: 31.7 % — ABNORMAL LOW (ref 34.8–46.6)
HEMOGLOBIN: 10 g/dL — AB (ref 11.6–15.9)
LYMPH%: 36.5 % (ref 14.0–49.7)
MCH: 26 pg (ref 25.1–34.0)
MCHC: 31.5 g/dL (ref 31.5–36.0)
MCV: 82.3 fL (ref 79.5–101.0)
MONO#: 0.3 10*3/uL (ref 0.1–0.9)
MONO%: 6.2 % (ref 0.0–14.0)
NEUT%: 49.6 % (ref 38.4–76.8)
NEUTROS ABS: 2.4 10*3/uL (ref 1.5–6.5)
Platelets: 208 10*3/uL (ref 145–400)
RBC: 3.85 10*6/uL (ref 3.70–5.45)
RDW: 15.4 % — AB (ref 11.2–14.5)
WBC: 4.8 10*3/uL (ref 3.9–10.3)
lymph#: 1.8 10*3/uL (ref 0.9–3.3)

## 2017-06-02 LAB — COMPREHENSIVE METABOLIC PANEL
ALBUMIN: 3.6 g/dL (ref 3.5–5.0)
ALK PHOS: 48 U/L (ref 40–150)
ALT: 6 U/L (ref 0–55)
ANION GAP: 9 meq/L (ref 3–11)
AST: 11 U/L (ref 5–34)
BILIRUBIN TOTAL: 0.36 mg/dL (ref 0.20–1.20)
BUN: 22.9 mg/dL (ref 7.0–26.0)
CALCIUM: 9.3 mg/dL (ref 8.4–10.4)
CO2: 20 meq/L — AB (ref 22–29)
CREATININE: 1.2 mg/dL — AB (ref 0.6–1.1)
Chloride: 112 mEq/L — ABNORMAL HIGH (ref 98–109)
EGFR: 49 mL/min/{1.73_m2} — AB (ref >60)
Glucose: 131 mg/dl (ref 70–140)
Potassium: 4.2 mEq/L (ref 3.5–5.1)
Sodium: 140 mEq/L (ref 136–145)
TOTAL PROTEIN: 7.4 g/dL (ref 6.4–8.3)

## 2017-06-02 NOTE — Telephone Encounter (Signed)
Gave avs and calendar for may 2019

## 2017-06-02 NOTE — Telephone Encounter (Signed)
Oncology Nurse Navigator Documentation  Oncology Nurse Navigator Flowsheets 06/02/2017  Navigator Location [facility]  Navigator Encounter Type Telephone/I called [name] [name] to change appt time. I was unable to reach and left vm message for her to call me.   Telephone Outgoing Call  Treatment Phase Other  Barriers/Navigation Needs Coordination of Care  Interventions Coordination of Care  Coordination of Care Appts  Acuity Level 2  Time Spent with Patient 30

## 2017-06-02 NOTE — Therapy (Signed)
[place], [place], [address] Phone: [phone]   Fax:  [phone]  Physical Therapy Evaluation  Patient Details  Name: [name] [name] MRN: [id number] Date of Birth: 1934-10-05 Referring Provider: Dr. Curt Bears  Encounter Date: 06/02/2017      PT End of Session - 06/02/17 1518    Visit Number 1   Number of Visits 1   PT Start Time 6962   PT Stop Time 1502   PT Time Calculation (min) 21 min   Activity Tolerance Patient tolerated treatment well   Behavior During Therapy [facility] for tasks assessed/performed      Past Medical History:  Diagnosis Date  . Anginal pain ([place])   . Arthritis   . Asthma   . Coronary artery disease   . Diabetes mellitus   . Dyspnea   . Early cataracts, bilateral   . GERD (gastroesophageal reflux disease)    occ  . Hypertension   . Lung cancer ([place])   . Mass of upper lobe of left lung    mediastinal adenopathy  . Palpitations   . Pneumonia   . Stroke [facility])    mini stroke  . Wears dentures   . Wears glasses     Past Surgical History:  Procedure Laterality Date  . ABDOMINAL HYSTERECTOMY    . ANKLE SURGERY Left    fusion  . COLONOSCOPY W/ BIOPSIES AND POLYPECTOMY    . MULTIPLE TOOTH EXTRACTIONS    . TOTAL KNEE ARTHROPLASTY Right   . VIDEO ASSISTED THORACOSCOPY (VATS)/ LOBECTOMY Left 04/21/2017   Procedure: VIDEO ASSISTED THORACOSCOPY (VATS)/LEFT UPPER LOBECTOMY;  Surgeon: Melrose Nakayama, MD;  Location: [place];  Service: Thoracic;  Laterality: Left;  Marland Kitchen VIDEO BRONCHOSCOPY WITH ENDOBRONCHIAL NAVIGATION N/A 01/21/2017   Procedure: VIDEO BRONCHOSCOPY WITH ENDOBRONCHIAL NAVIGATION;  Surgeon: Melrose Nakayama, MD;  Location: [place];  Service: Thoracic;  Laterality: N/A;  . VIDEO BRONCHOSCOPY WITH ENDOBRONCHIAL ULTRASOUND N/A 01/21/2017   Procedure: VIDEO BRONCHOSCOPY WITH ENDOBRONCHIAL ULTRASOUND;  Surgeon: Melrose Nakayama, MD;  [facility];   Service: Thoracic;  Laterality: N/A;    There were no vitals filed for this visit.       Subjective Assessment - 06/02/17 1505    Subjective Reports no pain from surgery at first, but then says it does bother her some with doing standing trunk ROM.   Patient is accompained by: Family member  daughter   Pertinent History p/w right anterior chest wall pain and workupshowed left lung nodule.  Had scans and bronchoscopy, then VATS lobectomy. Diagnosis is left upper lobe squamous cell carcinoma, 2.6 cm., stage I (negative tumor margins and 16 nodes removed were negative). She expects to have no further treatment but just be followed regularly.  Ex-smoker; asthma; arthritis; CAD; DM; HTN; h/o stroke; left ankle fusion, right TKA.   Patient Stated Goals get info from all lung clinic providers   Currently in Pain? No/denies            [facility] PT Assessment - 06/02/17 0001      Assessment   Medical Diagnosis left upper lobe squamous cell carcinoma, stage I, with VATS lobectomy 04/21/17   Referring Provider Dr. Curt Bears   Onset Date/Surgical Date 04/21/17   Hand Dominance Right   Prior Therapy none     Precautions   Precautions Fall   Precaution Comments cancer precautions     Restrictions   Weight Bearing Restrictions No  Balance Screen   Has the patient fallen in the past 6 months No   Has the patient had a decrease in activity level because of a fear of falling?  No   Is the patient reluctant to leave their home because of a fear of falling?  No     Home Social worker Private residence   Living Arrangements --  not alone; didn't state who lives with her   Type of [place] One level     Prior Function   Level of Independence Independent   Leisure has just started some walking on the advice of her thoracic surgeon, Dr. Roxan Hockey.  Is walking short distances currently.     Cognition   Overall Cognitive Status Within Functional  Limits for tasks assessed     Observation/Other Assessments   Observations Well-kept womah who has a cane with her     Coordination   Gross Motor Movements are Fluid and Coordinated Yes     Functional Tests   Functional tests Sit to Stand     Sit to Stand   Comments Pt. is unable to stand without using her arms to push up     Posture/Postural Control   Posture/Postural Control Postural limitations   Postural Limitations Flexed trunk;Forward head  slight forward flexion of trunk     ROM / Strength   AROM / PROM / Strength AROM     AROM   AROM Assessment Site Lumbar   Lumbar Flexion able to touch her toes with straight knees  all in standing   Lumbar Extension to neutral only, no extension past that   Lumbar - Right Side Bend 50% loss  reports pulling on surgical incision   Lumbar - Left Side Bend 50% loss   Lumbar - Right Rotation 25% loss  pulls on surgical incision   Lumbar - Left Rotation WFL     Ambulation/Gait   Ambulation/Gait Yes   Ambulation/Gait Assistance 6: Modified independent (Device/Increase time)   Assistive device Straight cane  generally uses straight cane; also has a walker, doesn't use   Gait Comments walked approx. 10 feet in exam room without cane; appears slightly unsteady.  She says her cardiologist advised her to use a cane to avoid falling and breaking a hip.     Balance   Balance Assessed Yes     Dynamic Standing Balance   Dynamic Standing - Comments reaches forward 12 inches in standing, above average for age            Objective measurements completed on examination: See above findings.                  PT Education - 06/02/17 1517    Education provided Yes   Education Details posture, walking, CURE article on staying active, "Why Exercise?" flyer, breathing, PT info   Person(s) Educated Patient   Methods Explanation;Handout   Comprehension Verbalized understanding               Lung Clinic Goals -  06/02/17 1525      Patient will be able to verbalize understanding of the benefit of exercise to decrease fatigue.   Status Achieved     Patient will be able to verbalize the importance of posture.   Status Achieved     Patient will be able to demonstrate diaphragmatic breathing for improved lung function.   Status Achieved     Patient will be  able to verbalize understanding of the role of physical therapy to prevent functional decline and who to contact if physical therapy is needed.   Status Achieved              Plan - 06/02/17 1518    Clinical Impression Statement Pt. who is about six weeks post-op VATS lobectomy for left upper lobe squamous cell carcinoma, stage I.  She has discomfort from incision with ROM of her trunk; her posture is slightly forward flexed and with forward head.  She has limited trunk A/ROM, particularly in extension, where she does not move beyond neutral flexion/extension. She uses a cane for balance on gait.    History and Personal Factors relevant to plan of care: history includes asthma, DM, arthritis, left ankle fusion, right TKA, mini stroke, CAD   Clinical Presentation Evolving   Clinical Presentation due to: recent lobectomy for lung cancer and adjusting to decreased pulmonary function   Clinical Decision Making Moderate   Rehab Potential Good   PT Frequency One time visit   PT Treatment/Interventions Patient/family education   PT Next Visit Plan None planned currently, although patient may benefit from further assessment of and work on her balance and gait to improve stability.   PT Home Exercise Plan posture, breathing, walking   Consulted and Agree with Plan of Care Patient      Patient will benefit from skilled therapeutic intervention in order to improve the following deficits and impairments:  Abnormal gait, Decreased balance, Cardiopulmonary status limiting activity, Decreased range of motion  Visit Diagnosis: Abnormal posture - Plan:  PT plan of care cert/re-cert  Difficulty in walking, not elsewhere classified - Plan: PT plan of care cert/re-cert  Aftercare following surgery for neoplasm - Plan: PT plan of care cert/re-cert  Squamous cell carcinoma of left lung ([place]) - Plan: PT plan of care cert/re-cert      G-Codes - 24/40/10 1526    Functional Assessment Tool Used (Outpatient Only) clinical judgement   Functional Limitation Mobility: Walking and moving around   Mobility: Walking and Moving Around Current Status [phone]) At least 20 percent but less than 40 percent impaired, limited or restricted   Mobility: Walking and Moving Around Goal Status [phone]) At least 20 percent but less than 40 percent impaired, limited or restricted   Mobility: Walking and Moving Around Discharge Status [phone]) At least 20 percent but less than 40 percent impaired, limited or restricted       Problem List Patient Active Problem List   Diagnosis Date Noted  . S/P lobectomy of lung 04/21/2017  . 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 ([place]) 02/16/2017  . Solitary pulmonary nodule 02/08/2017  . Shortness of breath 12/29/2016    Class: Acute  . Peripheral vascular disease, unspecified ([place]) 07/12/2013  . Atherosclerosis of native arteries of the extremities with intermittent claudication 07/12/2013  . Chest pain, cardiac 06/08/2011    Class: Acute  . Diabetes mellitus ([place]) 06/08/2011    Class: History of  . HTN (hypertension), benign 06/08/2011    Class: Chronic  . Obesity (BMI 30-39.9) 06/08/2011    Class: History of  . Coronary artery disease 06/08/2011    Class: History of    SALISBURY,DONNA 06/02/2017, 3:30 PM  [place], [place], [address] Phone: [phone]   Fax:  [phone]  Name: [name] [name] MRN: [id number] Date of Birth: [birth year]  Serafina Royals, PT 06/02/17 3:30 PM

## 2017-06-02 NOTE — Progress Notes (Signed)
[place] [phone]   [phone] Multidisciplinary thoracic oncology clinic  CONSULT NOTE  REFERRING PHYSICIAN: Dr. Modesto Charon.  REASON FOR CONSULTATION:  82 years old African-American female recently diagnosed with lung cancer.  HPI [name] [name] is a 82 y.o. female with past medical history significant for coronary artery disease, diabetes mellitus, GERD, hypertension as well as history of smoking.  The patient was admitted by her primary care physician on Dec 29, 2016 complaining of 1 week of shortness of breath and imaging studies at that time was suspicious for pneumonia.  Previous a chest x-ray on 12/23/2016 showed possible developing density in the left upper lobe.  CT scan of the chest on Dec 30, 2016 showed peripheral subpleural left upper lobe spiculated nodule measuring 1.8 x 1.7 cm.  There was also borderline right paratracheal adenopathy measuring 1.0 cm and precarinal node measuring 2.9 x 1.3 cm.  A PET scan on 01/12/2017 showed solitary spiculated hypermetabolic left upper lobe nodule without evidence of metastatic disease, no other suspicious metabolic activity.  On January 21, 2017 the patient underwent electromagnetic navigational bronchoscopy with needle aspiration, brushings and biopsies as well as endobronchial ultrasound with mediastinal lymph node aspiration under the care of Dr. Roxan Hockey.  The final pathology ([id number]) was consistent with poorly differentiated non-small cell carcinoma, squamous cell carcinoma.  On April 21, 2017 the patient underwent left VATS with left upper lobectomy and mediastinal lymph node dissection under the care of Dr. Roxan Hockey.  The final pathology ([id number]) was consistent with squamous cell carcinoma measuring 2.6 cm.  The tumor resection margin was negative and there was no evidence for lymphovascular invasion or visceral pleural invasion.  The dissected lymph nodes were negative for  malignancy. Dr. Roxan Hockey kindly referred the patient to the multidisciplinary thoracic oncology clinic today for evaluation and recommendation regarding her condition. When seen today the patient is feeling fine with no specific complaints except for dry cough shortness of breath with exertion but no significant chest pain or hemoptysis.  She is currently on Robitussin for cough.  She denied having any nausea, vomiting, diarrhea or constipation.  She has no headache or visual changes.  She lost a few pounds recently secondary to lack of taste. Family history significant for mother with congestive heart failure, father had prostate cancer, 2 sisters with breast cancer and lung cancer and a daughter with breast cancer. The patient is a widow and has 6 children.  She was accompanied today by her daughter [name] [name].  She used to work in the [employer].  She has a history of smoking for 60 years and quit in June 2018.  She has no history of alcohol or drug abuse.  HPI  Past Medical History:  Diagnosis Date  . Anginal pain ([place])   . Arthritis   . Asthma   . Coronary artery disease   . Diabetes mellitus   . Dyspnea   . Early cataracts, bilateral   . GERD (gastroesophageal reflux disease)    occ  . Hypertension   . Lung cancer ([place])   . Mass of upper lobe of left lung    mediastinal adenopathy  . Palpitations   . Pneumonia   . Stroke [facility])    mini stroke  . Wears dentures   . Wears glasses     Past Surgical History:  Procedure Laterality Date  . ABDOMINAL HYSTERECTOMY    . ANKLE SURGERY Left    fusion  . COLONOSCOPY  W/ BIOPSIES AND POLYPECTOMY    . MULTIPLE TOOTH EXTRACTIONS    . TOTAL KNEE ARTHROPLASTY Right   . VIDEO ASSISTED THORACOSCOPY (VATS)/ LOBECTOMY Left 04/21/2017   Procedure: VIDEO ASSISTED THORACOSCOPY (VATS)/LEFT UPPER LOBECTOMY;  Surgeon: Melrose Nakayama, MD;  Location: [place];  Service: Thoracic;  Laterality: Left;  Marland Kitchen VIDEO BRONCHOSCOPY WITH  ENDOBRONCHIAL NAVIGATION N/A 01/21/2017   Procedure: VIDEO BRONCHOSCOPY WITH ENDOBRONCHIAL NAVIGATION;  Surgeon: Melrose Nakayama, MD;  Location: [place];  Service: Thoracic;  Laterality: N/A;  . VIDEO BRONCHOSCOPY WITH ENDOBRONCHIAL ULTRASOUND N/A 01/21/2017   Procedure: VIDEO BRONCHOSCOPY WITH ENDOBRONCHIAL ULTRASOUND;  Surgeon: Melrose Nakayama, MD;  [facility];  Service: Thoracic;  Laterality: N/A;    Family History  Problem Relation Age of Onset  . Diabetes Mother   . Diabetes Sister   . Hyperlipidemia Sister   . Diabetes Brother   . Diabetes Son   . Cancer Neg Hx     Social History Social History  Substance Use Topics  . Smoking status: Former Smoker    Packs/day: 0.25    Years: 12.00    Types: Cigarettes    Quit date: 03/2017  . Smokeless tobacco: Never Used  . Alcohol use No    Allergies  Allergen Reactions  . Penicillins Rash and Other (See Comments)    PATIENT HAS HAD A PCN REACTION WITH IMMEDIATE RASH, FACIAL/TONGUE/THROAT SWELLING, SOB, OR LIGHTHEADEDNESS WITH HYPOTENSION:  #  #  #  YES  #  #  #  Has patient had a PCN reaction causing severe rash involving mucus membranes or skin necrosis: NO Has patient had a PCN reaction that required hospitalization NO Has patient had a PCN reaction occurring within the last 10 years: NO  . Codeine Rash    Current Outpatient Prescriptions  Medication Sig Dispense Refill  . acetaminophen (TYLENOL) 500 MG tablet Take 2 tablets (1,000 mg total) by mouth every 6 (six) hours as needed. 30 tablet 0  . amLODipine (NORVASC) 10 MG tablet Take 10 mg by mouth daily.      Marland Kitchen aspirin EC 81 MG tablet Take 81 mg by mouth daily.      Marland Kitchen atorvastatin (LIPITOR) 10 MG tablet Take 10 mg by mouth daily.  5  . Cholecalciferol (VITAMIN D3 PO) Take 1 tablet by mouth daily.    . hydrALAZINE (APRESOLINE) 25 MG tablet Take 25 mg by mouth 2 (two) times daily.     . isosorbide dinitrate (ISORDIL) 20 MG tablet Take 20 mg by mouth 2 (two) times  daily.     Marland Kitchen losartan (COZAAR) 50 MG tablet Take 50 mg by mouth daily.    . meclizine (ANTIVERT) 25 MG tablet Take 25 mg by mouth 2 (two) times daily as needed for dizziness.    . metFORMIN (GLUCOPHAGE) 500 MG tablet Take 500 mg by mouth 2 (two) times daily.     . metoprolol succinate (TOPROL-XL) 25 MG 24 hr tablet Take 25 mg by mouth every evening.     . nitroGLYCERIN (NITROSTAT) 0.4 MG SL tablet Place 0.4 mg under the tongue every 5 (five) minutes as needed for chest pain.    . pioglitazone (ACTOS) 30 MG tablet Take 30 mg by mouth every evening.     . TRADJENTA 5 MG TABS tablet TK 1 T PO  QD  1  . traMADol (ULTRAM) 50 MG tablet Take 1 tablet (50 mg total) by mouth every 6 (six) hours as needed. 30 tablet 0  No current facility-administered medications for this visit.     Review of Systems  Constitutional: positive for fatigue and weight loss Eyes: negative Ears, nose, mouth, throat, and face: negative Respiratory: positive for cough and dyspnea on exertion Cardiovascular: negative Gastrointestinal: negative Genitourinary:negative Integument/breast: negative Hematologic/lymphatic: negative Musculoskeletal:negative Neurological: negative Behavioral/Psych: negative Endocrine: negative Allergic/Immunologic: negative  Physical Exam  DXI:PJASN, healthy, no distress, well nourished and well developed SKIN: skin color, texture, turgor are normal, no rashes or significant lesions HEAD: Normocephalic, No masses, lesions, tenderness or abnormalities EYES: normal, PERRLA, Conjunctiva are pink and non-injected EARS: External ears normal, Canals clear OROPHARYNX:no exudate, no erythema and lips, buccal mucosa, and tongue normal  NECK: supple, no adenopathy, no JVD LYMPH:  no palpable lymphadenopathy, no hepatosplenomegaly BREAST:not examined LUNGS: clear to auscultation , and palpation HEART: regular rate & rhythm, no murmurs and no gallops ABDOMEN:abdomen soft, non-tender, normal  bowel sounds and no masses or organomegaly BACK: Back symmetric, no curvature., No CVA tenderness EXTREMITIES:no joint deformities, effusion, or inflammation, no edema, no skin discoloration  NEURO: alert & oriented x 3 with fluent speech, no focal motor/sensory deficits  PERFORMANCE STATUS: ECOG 1  LABORATORY DATA: Lab Results  Component Value Date   WBC 4.8 06/02/2017   HGB 10.0 (L) 06/02/2017   HCT 31.7 (L) 06/02/2017   MCV 82.3 06/02/2017   PLT 208 06/02/2017      Chemistry      Component Value Date/Time   NA 137 04/23/2017 0406   K 4.2 04/23/2017 0406   CL 112 (H) 04/23/2017 0406   CO2 20 (L) 04/23/2017 0406   BUN 14 04/23/2017 0406   CREATININE 1.03 (H) 04/23/2017 0406      Component Value Date/Time   CALCIUM 8.2 (L) 04/23/2017 0406   ALKPHOS 36 (L) 04/23/2017 0406   AST 15 04/23/2017 0406   ALT 7 (L) 04/23/2017 0406   BILITOT 0.5 04/23/2017 0406       RADIOGRAPHIC STUDIES: Dg Chest 2 View  Result Date: 05/31/2017 CLINICAL DATA:  History of sats 04/2017. Shortness of breath. Prior smoking history. EXAM: CHEST  2 VIEW COMPARISON:  05/10/2017 04/24/2017.  PET-CT 01/12/2017. FINDINGS: Mediastinum hilar structures normal. Cardiomegaly with normal pulmonary vascularity. Mild left base atelectasis and left-sided pleural effusion, improved from prior exam. Stable chronic interstitial changes. Stable elevation left hemidiaphragm. No pneumothorax. IMPRESSION: Mild left base atelectasis and left-sided pleural effusion, improved from prior exam. Stable elevation left hemidiaphragm. Electronically Signed   By: Marcello Moores  Register   On: 05/31/2017 09:44   Dg Chest 2 View  Result Date: 05/10/2017 CLINICAL DATA:  As post left lobectomy on April 21, 2017 EXAM: CHEST  2 VIEW COMPARISON:  Chest x-ray of April 25, 2017 FINDINGS: There is persistent volume loss on the left. There is a moderate sized left pleural effusion. The right lung is well-expanded. The interstitial markings  of both lungs are increased though stable. The heart is normal in size. The pulmonary vascularity is less engorged and more distinct. The trachea is midline. There is calcification in the wall of the aortic arch. The gas pattern in the upper abdomen is normal. There is mild multilevel degenerative disc disease of the thoracic spine. IMPRESSION: Interval accumulation of a moderate size left pleural effusion. Interval improvement in the pulmonary interstitium with decreased interstitial edema. Thoracic aortic atherosclerosis. Electronically Signed   By: David  Martinique M.D.   On: 05/10/2017 11:59    ASSESSMENT: This is a very pleasant 82 years old African-American female recently diagnosed  with a stage Ia (T1b, N0, M0) non-small cell lung cancer, squamous cell carcinoma presented with left upper lobe pulmonary nodule status post left upper lobectomy with lymph node dissection under the care of Dr. Roxan Hockey on April 21, 2017.  The tumor size was 2.6 cm with negative dissected lymphadenopathy and no evidence for invasion of the pleura or lymphovascular invasion.  PLAN: I had a lengthy discussion with the patient and her daughter today about her current disease is stage, prognosis and treatment options. I explained to the patient that the 5-year survival for stage Ia is in the range of 80%. I also explained to the patient that there is no survival benefit for adjuvant systemic chemotherapy for patient with a stage I a and tumor size less than 4.0 cm. I recommended for the patient to continue on observation with a repeat CT scan of the chest in 6 months. The patient was seen during the multidisciplinary thoracic oncology clinic today by medical oncology, thoracic navigator, social worker and physical therapist. She was advised to call immediately if she has any concerning symptoms in the interval. The patient voices understanding of current disease status and treatment options and is in agreement with the  current care plan.  All questions were answered. The patient knows to call the clinic with any problems, questions or concerns. We can certainly see the patient much sooner if necessary.  Thank you so much for allowing me to participate in the care of [name] [name]. I will continue to follow up the patient with you and assist in her care.  I spent 40 minutes counseling the patient face to face. The total time spent in the appointment was 60 minutes.  Disclaimer: This note was dictated with voice recognition software. Similar sounding words can inadvertently be transcribed and may not be corrected upon review.   Eilleen Kempf June 02, 2017, 1:43 PM

## 2017-11-25 NOTE — Telephone Encounter (Signed)
Error opening  

## 2017-11-28 ENCOUNTER — Telehealth: Payer: Self-pay | Admitting: Radiology

## 2017-11-30 ENCOUNTER — Other Ambulatory Visit: Payer: Self-pay | Admitting: Internal Medicine

## 2017-11-30 ENCOUNTER — Inpatient Hospital Stay: Payer: Medicare Other | Attending: Internal Medicine

## 2017-11-30 ENCOUNTER — Ambulatory Visit (HOSPITAL_COMMUNITY)
Admission: RE | Admit: 2017-11-30 | Discharge: 2017-11-30 | Disposition: A | Discharge status: Home / Self Care | Payer: Medicare Other | Source: Ambulatory Visit | Attending: Internal Medicine | Admitting: Internal Medicine

## 2017-11-30 DIAGNOSIS — C3490 Malignant neoplasm of unspecified part of unspecified bronchus or lung: Secondary | ICD-10-CM | POA: Diagnosis present

## 2017-11-30 DIAGNOSIS — R911 Solitary pulmonary nodule: Secondary | ICD-10-CM | POA: Diagnosis not present

## 2017-11-30 DIAGNOSIS — Z902 Acquired absence of lung [part of]: Secondary | ICD-10-CM | POA: Insufficient documentation

## 2017-11-30 DIAGNOSIS — C3412 Malignant neoplasm of upper lobe, left bronchus or lung: Secondary | ICD-10-CM | POA: Diagnosis not present

## 2017-11-30 DIAGNOSIS — I2510 Atherosclerotic heart disease of native coronary artery without angina pectoris: Secondary | ICD-10-CM | POA: Insufficient documentation

## 2017-11-30 DIAGNOSIS — I10 Essential (primary) hypertension: Secondary | ICD-10-CM | POA: Diagnosis not present

## 2017-11-30 DIAGNOSIS — I517 Cardiomegaly: Secondary | ICD-10-CM | POA: Insufficient documentation

## 2017-11-30 DIAGNOSIS — I288 Other diseases of pulmonary vessels: Secondary | ICD-10-CM | POA: Insufficient documentation

## 2017-11-30 DIAGNOSIS — M1990 Unspecified osteoarthritis, unspecified site: Secondary | ICD-10-CM | POA: Diagnosis not present

## 2017-11-30 DIAGNOSIS — Z7982 Long term (current) use of aspirin: Secondary | ICD-10-CM | POA: Insufficient documentation

## 2017-11-30 DIAGNOSIS — Z85118 Personal history of other malignant neoplasm of bronchus and lung: Secondary | ICD-10-CM | POA: Insufficient documentation

## 2017-11-30 DIAGNOSIS — Z7984 Long term (current) use of oral hypoglycemic drugs: Secondary | ICD-10-CM | POA: Diagnosis not present

## 2017-11-30 DIAGNOSIS — K219 Gastro-esophageal reflux disease without esophagitis: Secondary | ICD-10-CM | POA: Insufficient documentation

## 2017-11-30 DIAGNOSIS — E119 Type 2 diabetes mellitus without complications: Secondary | ICD-10-CM | POA: Insufficient documentation

## 2017-11-30 DIAGNOSIS — Z79899 Other long term (current) drug therapy: Secondary | ICD-10-CM | POA: Insufficient documentation

## 2017-11-30 LAB — COMPREHENSIVE METABOLIC PANEL
ALT: <6 U/L (ref 0–55)
ANION GAP: 8 (ref 3–11)
AST: 12 U/L (ref 5–34)
Albumin: 3.8 g/dL (ref 3.5–5.0)
Alkaline Phosphatase: 41 U/L (ref 40–150)
BUN: 24 mg/dL (ref 7–26)
CHLORIDE: 112 mmol/L — AB (ref 98–109)
CO2: 19 mmol/L — AB (ref 22–29)
Calcium: 9.2 mg/dL (ref 8.4–10.4)
Creatinine, Ser: 1.12 mg/dL — ABNORMAL HIGH (ref 0.60–1.10)
GFR calc non Af Amer: 45 mL/min — ABNORMAL LOW (ref >60)
GFR, EST AFRICAN AMERICAN: 52 mL/min — AB (ref >60)
Glucose, Bld: 121 mg/dL (ref 70–140)
Potassium: 3.9 mmol/L (ref 3.5–5.1)
SODIUM: 139 mmol/L (ref 136–145)
Total Bilirubin: 0.3 mg/dL (ref 0.2–1.2)
Total Protein: 7.2 g/dL (ref 6.4–8.3)

## 2017-11-30 LAB — CBC WITH DIFFERENTIAL/PLATELET
Basophils Absolute: 0 10*3/uL (ref 0.0–0.1)
Basophils Relative: 0 %
EOS ABS: 0.3 10*3/uL (ref 0.0–0.5)
EOS PCT: 5 %
HCT: 29.9 % — ABNORMAL LOW (ref 34.8–46.6)
Hemoglobin: 9.7 g/dL — ABNORMAL LOW (ref 11.6–15.9)
LYMPHS ABS: 1.9 10*3/uL (ref 0.9–3.3)
Lymphocytes Relative: 31 %
MCH: 26.1 pg (ref 25.1–34.0)
MCHC: 32.4 g/dL (ref 31.5–36.0)
MCV: 80.6 fL (ref 79.5–101.0)
MONO ABS: 0.4 10*3/uL (ref 0.1–0.9)
MONOS PCT: 7 %
Neutro Abs: 3.5 10*3/uL (ref 1.5–6.5)
Neutrophils Relative %: 57 %
PLATELETS: 238 10*3/uL (ref 145–400)
RBC: 3.71 MIL/uL (ref 3.70–5.45)
RDW: 15.6 % — AB (ref 11.2–14.5)
WBC: 6.1 10*3/uL (ref 3.9–10.3)

## 2017-11-30 MED ORDER — IOHEXOL 300 MG/ML  SOLN: 75.0000 mL | Freq: Once | INTRAMUSCULAR | Status: DC | PRN

## 2017-12-05 ENCOUNTER — Encounter: Payer: Self-pay | Admitting: Internal Medicine

## 2017-12-05 ENCOUNTER — Telehealth: Payer: Self-pay

## 2017-12-05 ENCOUNTER — Inpatient Hospital Stay (HOSPITAL_BASED_OUTPATIENT_CLINIC_OR_DEPARTMENT_OTHER): Payer: Medicare Other | Admitting: Internal Medicine

## 2017-12-05 VITALS — BP 173/58 | HR 61 | Temp 97.5°F | Resp 18 | Ht 69.0 in | Wt 187.7 lb

## 2017-12-05 DIAGNOSIS — I2510 Atherosclerotic heart disease of native coronary artery without angina pectoris: Secondary | ICD-10-CM

## 2017-12-05 DIAGNOSIS — I10 Essential (primary) hypertension: Secondary | ICD-10-CM | POA: Diagnosis not present

## 2017-12-05 DIAGNOSIS — E119 Type 2 diabetes mellitus without complications: Secondary | ICD-10-CM | POA: Diagnosis not present

## 2017-12-05 DIAGNOSIS — C3412 Malignant neoplasm of upper lobe, left bronchus or lung: Secondary | ICD-10-CM | POA: Diagnosis not present

## 2017-12-05 DIAGNOSIS — Z7982 Long term (current) use of aspirin: Secondary | ICD-10-CM

## 2017-12-05 DIAGNOSIS — Z79899 Other long term (current) drug therapy: Secondary | ICD-10-CM

## 2017-12-05 DIAGNOSIS — M1990 Unspecified osteoarthritis, unspecified site: Secondary | ICD-10-CM

## 2017-12-05 DIAGNOSIS — C3490 Malignant neoplasm of unspecified part of unspecified bronchus or lung: Secondary | ICD-10-CM

## 2017-12-05 DIAGNOSIS — Z7984 Long term (current) use of oral hypoglycemic drugs: Secondary | ICD-10-CM | POA: Diagnosis not present

## 2017-12-05 DIAGNOSIS — K219 Gastro-esophageal reflux disease without esophagitis: Secondary | ICD-10-CM

## 2017-12-05 NOTE — Telephone Encounter (Signed)
Printed avs and calender of upcoming appointment per 5/6 los no contrast (chest )

## 2017-12-05 NOTE — Progress Notes (Signed)
[place] [phone]   [phone]  OFFICE PROGRESS NOTE  [address], [place] [address]  DIAGNOSIS:  stage IA (T1b, N0, M0) non-small cell lung cancer, squamous cell carcinoma presented with left upper lobe pulmonary nodule   PRIOR THERAPY: Status post left upper lobectomy with lymph node dissection under the care of Dr. Roxan Hockey on April 21, 2017.  CURRENT THERAPY: Observation.  INTERVAL HISTORY: [name] [name] 83 y.o. female returns to the clinic today for follow-up visit accompanied by her daughter.  The patient is feeling fine with no complaints except for feeling a lump on the left lower rib cage likely secondary to her surgical resection.  She denied having any shortness of breath, cough or hemoptysis.  She denied having any recent weight loss or night sweats.  She has no nausea, vomiting, diarrhea or constipation.  She is current on observation.  She had repeat CT scan of the chest performed recently and she is here for evaluation and discussion of her risk her results.  MEDICAL HISTORY: Past Medical History:  Diagnosis Date  . Anginal pain ([place])   . Arthritis   . Asthma   . Coronary artery disease   . Diabetes mellitus   . Dyspnea   . Early cataracts, bilateral   . GERD (gastroesophageal reflux disease)    occ  . Hypertension   . Lung cancer ([place])   . Mass of upper lobe of left lung    mediastinal adenopathy  . Palpitations   . Pneumonia   . Stroke [facility]    mini stroke  . Wears dentures   . Wears glasses     ALLERGIES:  is allergic to penicillins and codeine.  MEDICATIONS:  Current Outpatient Medications  Medication Sig Dispense Refill  . amLODipine (NORVASC) 10 MG tablet Take 10 mg by mouth daily.      Marland Kitchen AQUALANCE LANCETS 30G MISC     . aspirin EC 81 MG tablet Take 81 mg by mouth daily.      Marland Kitchen atorvastatin (LIPITOR) 20 MG tablet     . Blood Glucose Calibration (OT  ULTRA/FASTTK CNTRL SOLN) SOLN     . Blood Glucose Monitoring Suppl (ONE TOUCH ULTRA 2) w/Device KIT     . Cholecalciferol (VITAMIN D3 PO) Take 1 tablet by mouth daily.    . ergocalciferol (VITAMIN D2) 50000 units capsule ergocalciferol (vitamin D2) 50,000 unit capsule    . guaifenesin (ROBITUSSIN) 100 MG/5ML syrup Take 200 mg by mouth 3 (three) times daily as needed for cough.    . hydrALAZINE (APRESOLINE) 25 MG tablet Take 25 mg by mouth 2 (two) times daily.     . isosorbide dinitrate (ISORDIL) 20 MG tablet Take 20 mg by mouth 2 (two) times daily.     Elmore Guise Devices (ADJUSTABLE LANCING DEVICE) MISC     . linagliptin (TRADJENTA) 5 MG TABS tablet Tradjenta 5 mg tablet    . losartan (COZAAR) 50 MG tablet Take 50 mg by mouth daily.    . meclizine (ANTIVERT) 25 MG tablet Take 25 mg by mouth 2 (two) times daily as needed for dizziness.    . metFORMIN (GLUCOPHAGE) 500 MG tablet Take 500 mg by mouth 2 (two) times daily.     . metoprolol succinate (TOPROL-XL) 25 MG 24 hr tablet Take 25 mg by mouth every evening.     . montelukast (SINGULAIR) 10 MG tablet montelukast 10 mg tablet    .  nitroGLYCERIN (NITROSTAT) 0.4 MG SL tablet Place 0.4 mg under the tongue every 5 (five) minutes as needed for chest pain.    . ONE TOUCH ULTRA TEST test strip     . pioglitazone (ACTOS) 30 MG tablet Take 30 mg by mouth every evening.     . traMADol (ULTRAM) 50 MG tablet tramadol 50 mg tablet    . valsartan (DIOVAN) 160 MG tablet TK 1 T PO QD  5   No current facility-administered medications for this visit.     SURGICAL HISTORY:  Past Surgical History:  Procedure Laterality Date  . ABDOMINAL HYSTERECTOMY    . ANKLE SURGERY Left    fusion  . COLONOSCOPY W/ BIOPSIES AND POLYPECTOMY    . MULTIPLE TOOTH EXTRACTIONS    . TOTAL KNEE ARTHROPLASTY Right   . VIDEO ASSISTED THORACOSCOPY (VATS)/ LOBECTOMY Left 04/21/2017   Procedure: VIDEO ASSISTED THORACOSCOPY (VATS)/LEFT UPPER LOBECTOMY;  Surgeon: Melrose Nakayama,  MD;  Location: [place];  Service: Thoracic;  Laterality: Left;  Marland Kitchen VIDEO BRONCHOSCOPY WITH ENDOBRONCHIAL NAVIGATION N/A 01/21/2017   Procedure: VIDEO BRONCHOSCOPY WITH ENDOBRONCHIAL NAVIGATION;  Surgeon: Melrose Nakayama, MD;  Location: [place];  Service: Thoracic;  Laterality: N/A;  . VIDEO BRONCHOSCOPY WITH ENDOBRONCHIAL ULTRASOUND N/A 01/21/2017   Procedure: VIDEO BRONCHOSCOPY WITH ENDOBRONCHIAL ULTRASOUND;  Surgeon: Melrose Nakayama, MD;  Location: [place];  Service: Thoracic;  Laterality: N/A;    REVIEW OF SYSTEMS:  A comprehensive review of systems was negative except for: Respiratory: positive for pleurisy/chest pain   PHYSICAL EXAMINATION: General appearance: alert, cooperative, fatigued and no distress Head: Normocephalic, without obvious abnormality, atraumatic Neck: no adenopathy, no JVD, supple, symmetrical, trachea midline and thyroid not enlarged, symmetric, no tenderness/mass/nodules Lymph nodes: Cervical, supraclavicular, and axillary nodes normal. Resp: clear to auscultation bilaterally Back: symmetric, no curvature. ROM normal. No CVA tenderness. Cardio: regular rate and rhythm, S1, S2 normal, no murmur, click, rub or gallop GI: soft, non-tender; bowel sounds normal; no masses,  no organomegaly Extremities: extremities normal, atraumatic, no cyanosis or edema  ECOG PERFORMANCE STATUS: 1 - Symptomatic but completely ambulatory  Blood pressure (!) 173/58, pulse 61, temperature (!) 97.5 F (36.4 C), temperature source Oral, resp. rate 18, height _0  (1.753 m), weight 187 lb 11.2 oz (85.1 kg), SpO2 98 %.  LABORATORY DATA: Lab Results  Component Value Date   WBC 6.1 11/30/2017   HGB 9.7 (L) 11/30/2017   HCT 29.9 (L) 11/30/2017   MCV 80.6 11/30/2017   PLT 238 11/30/2017      Chemistry      Component Value Date/Time   NA 139 11/30/2017 1031   NA 140 06/02/2017 1322   K 3.9 11/30/2017 1031   K 4.2 06/02/2017 1322   CL 112 (H) 11/30/2017 1031   CO2 19 (L)  11/30/2017 1031   CO2 20 (L) 06/02/2017 1322   BUN 24 11/30/2017 1031   BUN 22.9 06/02/2017 1322   CREATININE 1.12 (H) 11/30/2017 1031   CREATININE 1.2 (H) 06/02/2017 1322      Component Value Date/Time   CALCIUM 9.2 11/30/2017 1031   CALCIUM 9.3 06/02/2017 1322   ALKPHOS 41 11/30/2017 1031   ALKPHOS 48 06/02/2017 1322   AST 12 11/30/2017 1031   AST 11 06/02/2017 1322   ALT <6 11/30/2017 1031   ALT 6 06/02/2017 1322   BILITOT 0.3 11/30/2017 1031   BILITOT 0.36 06/02/2017 1322       RADIOGRAPHIC STUDIES: Ct Chest Wo Contrast  Result Date: 11/30/2017  CLINICAL DATA:  Squamous cell left upper lobe lung carcinoma status post left upper lobectomy 04/21/2017. Restaging. EXAM: CT CHEST WITHOUT CONTRAST TECHNIQUE: Multidetector CT imaging of the chest was performed following the standard protocol without IV contrast. COMPARISON:  01/12/2017 PET-CT.  12/30/2016 chest CT. FINDINGS: Cardiovascular: Stable mild cardiomegaly. No significant pericardial fluid/thickening. Three-vessel coronary atherosclerosis. Atherosclerotic nonaneurysmal thoracic aorta. Stable dilated main pulmonary artery (4.0 cm diameter). Mediastinum/Nodes: No discrete thyroid nodules. Unremarkable esophagus. No axillary adenopathy. Mildly enlarged right paratracheal nodes up to 1.4 cm (series 2/image 47), stable since 12/30/2016 chest CT. No new pathologically enlarged mediastinal nodes. No discrete hilar adenopathy on this noncontrast scan. Lungs/Pleura: Interval left upper lobectomy. No pneumothorax. Moderate centrilobular and mild paraseptal emphysema with diffuse bronchial wall thickening. No pleural effusion. Subpleural 6 mm solid medial right upper lobe pulmonary nodule (series 5/image 51), stable since 12/30/2016 chest CT. No acute consolidative airspace disease, lung masses or new significant pulmonary nodules. Upper abdomen: Small hiatal hernia.  Colonic diverticulosis. Musculoskeletal: No aggressive appearing focal osseous  lesions. Marked thoracic spondylosis. IMPRESSION: 1. No evidence of local tumor recurrence in the left lung status post left upper lobectomy. 2. No findings suspicious for metastatic disease in the chest. 3. Subpleural 6 mm solid medial right upper lobe pulmonary nodule is stable since 12/30/2016 chest CT, probably benign. Continued attention on follow-up chest CT recommended. 4. Mild right paratracheal adenopathy is stable since 12/30/2016 chest CT, probably reactive. 5. Stable mild cardiomegaly, dilated main pulmonary artery and three-vessel coronary atherosclerosis. Aortic Atherosclerosis (ICD10-I70.0) and Emphysema (ICD10-J43.9). Electronically Signed   By: Ilona Sorrel M.D.   On: 11/30/2017 16:07    ASSESSMENT AND PLAN: This is a very pleasant 83 years old African-American female with a stage Ia non-small cell lung cancer status post left upper lobectomy with lymph node dissection.  She is currently on observation.  The patient has no concerning complaints today except for feeling a lump in the left lower rib cage secondary to her surgical resection. She had repeat CT scan of the chest performed recently.  I personally and independently reviewed the scans.  Her scan showed no concerning findings for disease recurrence.  I recommended for her to continue on observation with a repeat CT scan of the chest in 6 months. For hypertension I strongly encouraged the patient to take her blood pressure medication and to monitor her blood pressure closely at home and report to her primary care physician for adjustment if needed. She was advised to call immediately if she has any other concerning symptoms in the interval. The patient voices understanding of current disease status and treatment options and is in agreement with the current care plan.  All questions were answered. The patient knows to call the clinic with any problems, questions or concerns. We can certainly see the patient much sooner if  necessary.  I spent 10 minutes counseling the patient face to face. The total time spent in the appointment was 15 minutes.  Disclaimer: This note was dictated with voice recognition software. Similar sounding words can inadvertently be transcribed and may not be corrected upon review.

## 2018-01-30 IMAGING — PT NM PET TUM IMG INITIAL (PI) SKULL BASE T - THIGH
1 of 8 series · 1 of 25 positions shown · non-contrast
Comparison: Chest CT 12/30/2016

CLINICAL DATA: Initial treatment strategy for spiculated left upper
lobe pulmonary nodule and mediastinal adenopathy, presumed lung
cancer.

EXAM:
NUCLEAR MEDICINE PET SKULL BASE TO THIGH
TECHNIQUE: 9.78 mCi F-18 FDG was injected intravenously. Full-ring PET imaging
was performed from the skull base to thigh after the radiotracer. CT
data was obtained and used for attenuation correction and anatomic
localization.
FASTING BLOOD GLUCOSE:  Value: 127 mg/dl

[Series 4: ct sk_thigh 5.0 b31f · axial · 5.0mm · 0.98mm/px · 1 of 196 slices shown]
[im 196/196  brain]
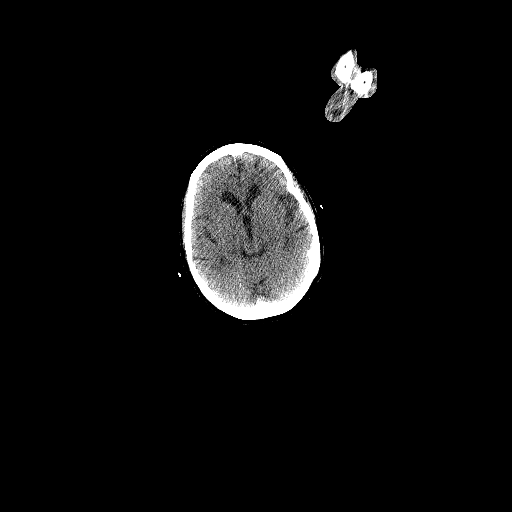

[1 of 25 positions shown; findings below may reference images not displayed]

FINDINGS: NECK

No hypermetabolic cervical lymph nodes are identified.There are no
lesions of the pharyngeal mucosal space. There is physiologic
activity associated with the muscles of phonation. Carotid
atherosclerosis noted bilaterally.

CHEST

There are no hypermetabolic mediastinal, hilar or axillary lymph
nodes. Specifically, the previously demonstrated right paratracheal
and precarinal lymph nodes demonstrate activity similar to blood
pool. The spiculated left upper lobe nodule is hypermetabolic. This
nodule measures 18 x 18 mm on image 17 and has an SUV max of 15.1.
No other hypermetabolic pulmonary activity. There is no abnormal
activity within the small subpleural right upper lobe nodule (image
25), suboptimally evaluated based on size. Underlying centrilobular
and paraseptal emphysema, cardiomegaly and atherosclerosis again
noted.

ABDOMEN/PELVIS

There is no hypermetabolic activity within the liver, adrenal
glands, spleen or pancreas. There is no hypermetabolic nodal
activity. Diffuse aortic and branch vessel atherosclerosis noted.
Previous cholecystectomy and hysterectomy with probable pelvic floor
laxity.

SKELETON

There is no hypermetabolic activity to suggest osseous metastatic
disease. Mild soft tissue activity lateral to the left femoral neck,
likely inflammatory.
IMPRESSION: 1. Solitary, spiculated hypermetabolic left upper lobe nodule
without evidence metastatic disease (most consistent with stage IA
disease -[id number]).
2. No other suspicious metabolic activity.
3. Aortic Atherosclerosis (6NO89-MZ5.5) and Emphysema (6NO89-T64.I).

## 2018-02-24 ENCOUNTER — Other Ambulatory Visit: Payer: Self-pay | Admitting: Thoracic Surgery (Cardiothoracic Vascular Surgery)

## 2018-02-24 DIAGNOSIS — C3412 Malignant neoplasm of upper lobe, left bronchus or lung: Secondary | ICD-10-CM

## 2018-02-28 ENCOUNTER — Other Ambulatory Visit: Payer: Self-pay

## 2018-02-28 ENCOUNTER — Ambulatory Visit (INDEPENDENT_AMBULATORY_CARE_PROVIDER_SITE_OTHER): Payer: Medicare Other | Admitting: Thoracic Surgery (Cardiothoracic Vascular Surgery)

## 2018-02-28 ENCOUNTER — Encounter: Payer: Self-pay | Admitting: Thoracic Surgery (Cardiothoracic Vascular Surgery)

## 2018-02-28 ENCOUNTER — Ambulatory Visit
Admission: RE | Admit: 2018-02-28 | Discharge: 2018-02-28 | Disposition: A | Discharge status: Home / Self Care | Payer: Medicare Other | Source: Ambulatory Visit | Attending: Thoracic Surgery (Cardiothoracic Vascular Surgery) | Admitting: Thoracic Surgery (Cardiothoracic Vascular Surgery)

## 2018-02-28 VITALS — BP 143/70 | HR 64 | Resp 16 | Ht 69.0 in | Wt 188.0 lb

## 2018-02-28 DIAGNOSIS — Z902 Acquired absence of lung [part of]: Secondary | ICD-10-CM | POA: Diagnosis not present

## 2018-02-28 DIAGNOSIS — C3412 Malignant neoplasm of upper lobe, left bronchus or lung: Secondary | ICD-10-CM

## 2018-02-28 DIAGNOSIS — C3492 Malignant neoplasm of unspecified part of left bronchus or lung: Secondary | ICD-10-CM

## 2018-02-28 NOTE — Progress Notes (Signed)
[address]       [place] [address]             [phone]     HPI: [name] [name] returns for a scheduled follow-up visit  [name] [name] is an 83 year old woman with a past history of coronary artery disease, type 2 diabetes, reflux, hypertension, tobacco abuse (quit in August 2018), and a stage IA squamous cell carcinoma of the left upper lobe.  She underwent left upper lobectomy in September 2018.  She saw Dr. Julien Nordmann.  She did not require adjuvant therapy.  She saw Dr. Julien Nordmann in May.  A CT showed no evidence of recurrent disease.  She complains that her appetite has never returned to normal since surgery.  She has lost quite a bit of weight.  She also complains of a "lump" on her left side.  Is not had any respiratory issues recently.  Past Medical History:  Diagnosis Date  . Anginal pain ([place])   . Arthritis   . Asthma   . Coronary artery disease   . Diabetes mellitus   . Dyspnea   . Early cataracts, bilateral   . GERD (gastroesophageal reflux disease)    occ  . Hypertension   . Lung cancer ([place])   . Mass of upper lobe of left lung    mediastinal adenopathy  . Palpitations   . Pneumonia   . Stroke [facility]    mini stroke  . Wears dentures   . Wears glasses      Current Outpatient Medications  Medication Sig Dispense Refill  . amLODipine (NORVASC) 10 MG tablet Take 10 mg by mouth daily.      Marland Kitchen AQUALANCE LANCETS 30G MISC     . aspirin EC 81 MG tablet Take 81 mg by mouth daily.      Marland Kitchen atorvastatin (LIPITOR) 20 MG tablet     . Blood Glucose Calibration (OT ULTRA/FASTTK CNTRL SOLN) SOLN     . Blood Glucose Monitoring Suppl (ONE TOUCH ULTRA 2) w/Device KIT     . Cholecalciferol (VITAMIN D3 PO) Take 1 tablet by mouth daily.    . ergocalciferol (VITAMIN D2) 50000 units capsule ergocalciferol (vitamin D2) 50,000 unit capsule    . guaifenesin (ROBITUSSIN) 100 MG/5ML syrup Take 200 mg by mouth 3 (three) times daily as needed for cough.    . hydrALAZINE  (APRESOLINE) 25 MG tablet Take 25 mg by mouth 2 (two) times daily.     . isosorbide dinitrate (ISORDIL) 20 MG tablet Take 20 mg by mouth 2 (two) times daily.     Elmore Guise Devices (ADJUSTABLE LANCING DEVICE) MISC     . linagliptin (TRADJENTA) 5 MG TABS tablet Tradjenta 5 mg tablet    . losartan (COZAAR) 50 MG tablet Take 50 mg by mouth daily.    . meclizine (ANTIVERT) 25 MG tablet Take 25 mg by mouth 2 (two) times daily as needed for dizziness.    . metFORMIN (GLUCOPHAGE) 500 MG tablet Take 500 mg by mouth 2 (two) times daily.     . metoprolol succinate (TOPROL-XL) 25 MG 24 hr tablet Take 25 mg by mouth every evening.     . montelukast (SINGULAIR) 10 MG tablet montelukast 10 mg tablet    . nitroGLYCERIN (NITROSTAT) 0.4 MG SL tablet Place 0.4 mg under the tongue every 5 (five) minutes as needed for chest pain.    Marland Kitchen OMEPRAZOLE PO Take by mouth every other day. ALTERNATE WITH TUMS    .  ONE TOUCH ULTRA TEST test strip     . pioglitazone (ACTOS) 30 MG tablet Take 30 mg by mouth every evening.     . traMADol (ULTRAM) 50 MG tablet tramadol 50 mg tablet    . valsartan (DIOVAN) 160 MG tablet TK 1 T PO QD  5   No current facility-administered medications for this visit.     Physical Exam BP (!) 143/70 (BP Location: Right Arm, Patient Position: Sitting, Cuff Size: Large)   Pulse 64   Resp 16   Ht 5' 9"  (1.753 m)   Wt 188 lb (85.3 kg)   SpO2 97% Comment: RA  BMI 27.33 kg/m  83 year old woman in no acute distress Alert and oriented x3 with no focal deficits Lungs diminished at left base, otherwise clear Incisions well-healed Bony prominence at left costal margin, nontender  Diagnostic Tests: CHEST - 2 VIEW  COMPARISON:  05/31/2017  FINDINGS: Cardiomegaly with vascular congestion and diffuse interstitial prominence, likely chronic interstitial lung disease. There is hyperinflation of the lungs compatible with COPD. No effusions. Left base scarring or atelectasis. No acute bony  abnormality.  IMPRESSION: COPD.  Chronic interstitial lung disease.  Cardiomegaly, vascular congestion.   Electronically Signed   By: Rolm Baptise M.D.   On: 02/28/2018 10:26 I personally reviewed the chest x-ray images and concur with the findings noted above  Impression: [name] [name] is an 83 year old former smoker who had a thoracoscopic left upper lobectomy for stage Ia non-small cell carcinoma a year ago.  She is doing well with no evidence of recurrent disease.  Her primary complaint is a poor appetite and weight loss.  This has been present ever since the surgery.  That is quite common initially after surgery but rare to last this long.  The "lump" along her left costal margin appears to be just bony prominence.  Probably noticed because of her weight loss.  Looking back at her CT from May there was no pathology in that area.  Plan: Follow-up as scheduled with Dr. Julien Nordmann.  I will see her back in 6 months to check on her progress  Melrose Nakayama, MD [facility] [phone]

## 2018-06-05 ENCOUNTER — Inpatient Hospital Stay: Payer: Medicare Other | Attending: Internal Medicine

## 2018-06-05 ENCOUNTER — Ambulatory Visit (HOSPITAL_COMMUNITY)
Admission: RE | Admit: 2018-06-05 | Discharge: 2018-06-05 | Disposition: A | Discharge status: Home / Self Care | Payer: Medicare Other | Source: Ambulatory Visit | Attending: Internal Medicine | Admitting: Internal Medicine

## 2018-06-05 ENCOUNTER — Encounter (HOSPITAL_COMMUNITY): Payer: Self-pay

## 2018-06-05 DIAGNOSIS — I10 Essential (primary) hypertension: Secondary | ICD-10-CM | POA: Insufficient documentation

## 2018-06-05 DIAGNOSIS — J432 Centrilobular emphysema: Secondary | ICD-10-CM | POA: Insufficient documentation

## 2018-06-05 DIAGNOSIS — Z7982 Long term (current) use of aspirin: Secondary | ICD-10-CM | POA: Insufficient documentation

## 2018-06-05 DIAGNOSIS — C3412 Malignant neoplasm of upper lobe, left bronchus or lung: Secondary | ICD-10-CM | POA: Insufficient documentation

## 2018-06-05 DIAGNOSIS — M1990 Unspecified osteoarthritis, unspecified site: Secondary | ICD-10-CM | POA: Insufficient documentation

## 2018-06-05 DIAGNOSIS — K219 Gastro-esophageal reflux disease without esophagitis: Secondary | ICD-10-CM | POA: Insufficient documentation

## 2018-06-05 DIAGNOSIS — Z880 Allergy status to penicillin: Secondary | ICD-10-CM | POA: Insufficient documentation

## 2018-06-05 DIAGNOSIS — Z885 Allergy status to narcotic agent status: Secondary | ICD-10-CM | POA: Diagnosis not present

## 2018-06-05 DIAGNOSIS — C3490 Malignant neoplasm of unspecified part of unspecified bronchus or lung: Secondary | ICD-10-CM | POA: Insufficient documentation

## 2018-06-05 DIAGNOSIS — I2510 Atherosclerotic heart disease of native coronary artery without angina pectoris: Secondary | ICD-10-CM | POA: Insufficient documentation

## 2018-06-05 DIAGNOSIS — E119 Type 2 diabetes mellitus without complications: Secondary | ICD-10-CM | POA: Diagnosis not present

## 2018-06-05 DIAGNOSIS — Z902 Acquired absence of lung [part of]: Secondary | ICD-10-CM | POA: Insufficient documentation

## 2018-06-05 DIAGNOSIS — I700 Atherosclerosis of aorta: Secondary | ICD-10-CM | POA: Insufficient documentation

## 2018-06-05 DIAGNOSIS — R222 Localized swelling, mass and lump, trunk: Secondary | ICD-10-CM | POA: Diagnosis not present

## 2018-06-05 DIAGNOSIS — R5383 Other fatigue: Secondary | ICD-10-CM | POA: Insufficient documentation

## 2018-06-05 DIAGNOSIS — Z79899 Other long term (current) drug therapy: Secondary | ICD-10-CM | POA: Insufficient documentation

## 2018-06-05 DIAGNOSIS — Z8673 Personal history of transient ischemic attack (TIA), and cerebral infarction without residual deficits: Secondary | ICD-10-CM | POA: Insufficient documentation

## 2018-06-05 DIAGNOSIS — Z7984 Long term (current) use of oral hypoglycemic drugs: Secondary | ICD-10-CM | POA: Diagnosis not present

## 2018-06-05 LAB — CBC WITH DIFFERENTIAL (CANCER CENTER ONLY)
ABS IMMATURE GRANULOCYTES: 0.03 10*3/uL (ref 0.00–0.07)
BASOS ABS: 0 10*3/uL (ref 0.0–0.1)
Basophils Relative: 1 %
EOS ABS: 0.3 10*3/uL (ref 0.0–0.5)
Eosinophils Relative: 5 %
HEMATOCRIT: 33.2 % — AB (ref 36.0–46.0)
HEMOGLOBIN: 10.5 g/dL — AB (ref 12.0–15.0)
IMMATURE GRANULOCYTES: 1 %
LYMPHS ABS: 1.9 10*3/uL (ref 0.7–4.0)
LYMPHS PCT: 33 %
MCH: 25.7 pg — ABNORMAL LOW (ref 26.0–34.0)
MCHC: 31.6 g/dL (ref 30.0–36.0)
MCV: 81.2 fL (ref 80.0–100.0)
MONOS PCT: 7 %
Monocytes Absolute: 0.4 10*3/uL (ref 0.1–1.0)
NEUTROS PCT: 53 %
Neutro Abs: 3.1 10*3/uL (ref 1.7–7.7)
Platelet Count: 279 10*3/uL (ref 150–400)
RBC: 4.09 MIL/uL (ref 3.87–5.11)
RDW: 15.8 % — AB (ref 11.5–15.5)
WBC Count: 5.7 10*3/uL (ref 4.0–10.5)
nRBC: 0 % (ref 0.0–0.2)

## 2018-06-05 LAB — CMP (CANCER CENTER ONLY)
ALBUMIN: 3.6 g/dL (ref 3.5–5.0)
ALK PHOS: 48 U/L (ref 38–126)
ALT: 7 U/L (ref 0–44)
ANION GAP: 8 (ref 5–15)
AST: 12 U/L — ABNORMAL LOW (ref 15–41)
BILIRUBIN TOTAL: 0.2 mg/dL — AB (ref 0.3–1.2)
BUN: 22 mg/dL (ref 8–23)
CO2: 21 mmol/L — ABNORMAL LOW (ref 22–32)
Calcium: 9.3 mg/dL (ref 8.9–10.3)
Chloride: 111 mmol/L (ref 98–111)
Creatinine: 1.11 mg/dL — ABNORMAL HIGH (ref 0.44–1.00)
GFR, Est AFR Am: 53 mL/min — ABNORMAL LOW (ref >60)
GFR, Estimated: 45 mL/min — ABNORMAL LOW (ref >60)
GLUCOSE: 109 mg/dL — AB (ref 70–99)
Potassium: 5.2 mmol/L — ABNORMAL HIGH (ref 3.5–5.1)
Sodium: 140 mmol/L (ref 135–145)
TOTAL PROTEIN: 7.3 g/dL (ref 6.5–8.1)

## 2018-06-05 MED ORDER — IOHEXOL 300 MG/ML  SOLN
75.0000 mL | Freq: Once | INTRAMUSCULAR | Status: AC | PRN
  Administered 2018-06-05: 75 mL via INTRAVENOUS

## 2018-06-05 MED ORDER — SODIUM CHLORIDE 0.9 % IJ SOLN
INTRAMUSCULAR | Status: AC
  Filled 2018-06-05: qty 50

## 2018-06-07 ENCOUNTER — Encounter: Payer: Self-pay | Admitting: Internal Medicine

## 2018-06-07 ENCOUNTER — Telehealth: Payer: Self-pay | Admitting: Internal Medicine

## 2018-06-07 ENCOUNTER — Inpatient Hospital Stay (HOSPITAL_BASED_OUTPATIENT_CLINIC_OR_DEPARTMENT_OTHER): Payer: Medicare Other | Admitting: Internal Medicine

## 2018-06-07 VITALS — BP 149/61 | HR 64 | Temp 97.8°F | Resp 18 | Ht 69.0 in | Wt 184.5 lb

## 2018-06-07 DIAGNOSIS — I2510 Atherosclerotic heart disease of native coronary artery without angina pectoris: Secondary | ICD-10-CM

## 2018-06-07 DIAGNOSIS — Z7984 Long term (current) use of oral hypoglycemic drugs: Secondary | ICD-10-CM

## 2018-06-07 DIAGNOSIS — R5383 Other fatigue: Secondary | ICD-10-CM | POA: Diagnosis not present

## 2018-06-07 DIAGNOSIS — Z8673 Personal history of transient ischemic attack (TIA), and cerebral infarction without residual deficits: Secondary | ICD-10-CM

## 2018-06-07 DIAGNOSIS — C3412 Malignant neoplasm of upper lobe, left bronchus or lung: Secondary | ICD-10-CM

## 2018-06-07 DIAGNOSIS — E119 Type 2 diabetes mellitus without complications: Secondary | ICD-10-CM

## 2018-06-07 DIAGNOSIS — Z885 Allergy status to narcotic agent status: Secondary | ICD-10-CM

## 2018-06-07 DIAGNOSIS — I10 Essential (primary) hypertension: Secondary | ICD-10-CM

## 2018-06-07 DIAGNOSIS — Z880 Allergy status to penicillin: Secondary | ICD-10-CM

## 2018-06-07 DIAGNOSIS — I700 Atherosclerosis of aorta: Secondary | ICD-10-CM

## 2018-06-07 DIAGNOSIS — K219 Gastro-esophageal reflux disease without esophagitis: Secondary | ICD-10-CM

## 2018-06-07 DIAGNOSIS — Z79899 Other long term (current) drug therapy: Secondary | ICD-10-CM

## 2018-06-07 DIAGNOSIS — Z7982 Long term (current) use of aspirin: Secondary | ICD-10-CM

## 2018-06-07 DIAGNOSIS — R222 Localized swelling, mass and lump, trunk: Secondary | ICD-10-CM

## 2018-06-07 DIAGNOSIS — M1990 Unspecified osteoarthritis, unspecified site: Secondary | ICD-10-CM

## 2018-06-07 DIAGNOSIS — C3490 Malignant neoplasm of unspecified part of unspecified bronchus or lung: Secondary | ICD-10-CM

## 2018-06-07 NOTE — Telephone Encounter (Signed)
Appts scheduled avs/calendar printed per 11/6 los °

## 2018-06-07 NOTE — Progress Notes (Signed)
[place] [phone]   [phone]  OFFICE PROGRESS NOTE  [address], [place] [address]  DIAGNOSIS:  stage IA (T1b, N0, M0) non-small cell lung cancer, squamous cell carcinoma presented with left upper lobe pulmonary nodule   PRIOR THERAPY: Status post left upper lobectomy with lymph node dissection under the care of Dr. Roxan Hockey on April 21, 2017.  CURRENT THERAPY: Observation.  INTERVAL HISTORY: [name] [name] 83 y.o. female returns to the clinic today for follow-up visit accompanied by her daughter.  The patient is feeling fine today with no specific complaints except for feeling a lump on the left side of the chest.  She was seen by Dr. Roxan Hockey recently and she was a shoulder.  This lump has surgical changes after her previous lung surgery.  The patient denied having any current shortness of breath but has mild cough with no hemoptysis.  She denied having any fever or chills.  She has no significant weight loss or night sweats.  She had repeat CT scan of the chest performed recently and she is here for evaluation and discussion of her risk her results.  MEDICAL HISTORY: Past Medical History:  Diagnosis Date  . Anginal pain ([place])   . Arthritis   . Asthma   . Coronary artery disease   . Diabetes mellitus   . Dyspnea   . Early cataracts, bilateral   . GERD (gastroesophageal reflux disease)    occ  . Hypertension   . Lung cancer ([place])   . Mass of upper lobe of left lung    mediastinal adenopathy  . Palpitations   . Pneumonia   . Stroke [facility])    mini stroke  . Wears dentures   . Wears glasses     ALLERGIES:  is allergic to penicillins and codeine.  MEDICATIONS:  Current Outpatient Medications  Medication Sig Dispense Refill  . amLODipine (NORVASC) 10 MG tablet Take 10 mg by mouth daily.      Marland Kitchen AQUALANCE LANCETS 30G MISC     . aspirin EC 81 MG tablet Take 81 mg by mouth  daily.      Marland Kitchen atorvastatin (LIPITOR) 20 MG tablet     . Blood Glucose Calibration (OT ULTRA/FASTTK CNTRL SOLN) SOLN     . Blood Glucose Monitoring Suppl (ONE TOUCH ULTRA 2) w/Device KIT     . Cholecalciferol (VITAMIN D3 PO) Take 1 tablet by mouth daily.    . ergocalciferol (VITAMIN D2) 50000 units capsule ergocalciferol (vitamin D2) 50,000 unit capsule    . guaifenesin (ROBITUSSIN) 100 MG/5ML syrup Take 200 mg by mouth 3 (three) times daily as needed for cough.    . hydrALAZINE (APRESOLINE) 25 MG tablet Take 25 mg by mouth 2 (two) times daily.     . isosorbide dinitrate (ISORDIL) 20 MG tablet Take 20 mg by mouth 2 (two) times daily.     Elmore Guise Devices (ADJUSTABLE LANCING DEVICE) MISC     . linagliptin (TRADJENTA) 5 MG TABS tablet Tradjenta 5 mg tablet    . losartan (COZAAR) 50 MG tablet Take 50 mg by mouth daily.    . meclizine (ANTIVERT) 25 MG tablet Take 25 mg by mouth 2 (two) times daily as needed for dizziness.    . metFORMIN (GLUCOPHAGE) 500 MG tablet Take 500 mg by mouth 2 (two) times daily.     . metoprolol succinate (TOPROL-XL) 25 MG 24 hr tablet Take 25 mg  by mouth every evening.     . montelukast (SINGULAIR) 10 MG tablet montelukast 10 mg tablet    . nitroGLYCERIN (NITROSTAT) 0.4 MG SL tablet Place 0.4 mg under the tongue every 5 (five) minutes as needed for chest pain.    Marland Kitchen OMEPRAZOLE PO Take by mouth every other day. ALTERNATE WITH TUMS    . ONE TOUCH ULTRA TEST test strip     . pioglitazone (ACTOS) 30 MG tablet Take 30 mg by mouth every evening.     . traMADol (ULTRAM) 50 MG tablet tramadol 50 mg tablet    . valsartan (DIOVAN) 160 MG tablet TK 1 T PO QD  5   No current facility-administered medications for this visit.     SURGICAL HISTORY:  Past Surgical History:  Procedure Laterality Date  . ABDOMINAL HYSTERECTOMY    . ANKLE SURGERY Left    fusion  . COLONOSCOPY W/ BIOPSIES AND POLYPECTOMY    . MULTIPLE TOOTH EXTRACTIONS    . TOTAL KNEE ARTHROPLASTY Right   . VIDEO  ASSISTED THORACOSCOPY (VATS)/ LOBECTOMY Left 04/21/2017   Procedure: VIDEO ASSISTED THORACOSCOPY (VATS)/LEFT UPPER LOBECTOMY;  Surgeon: Melrose Nakayama, MD;  Location: [facility];  Service: Thoracic;  Laterality: Left;  Marland Kitchen VIDEO BRONCHOSCOPY WITH ENDOBRONCHIAL NAVIGATION N/A 01/21/2017   Procedure: VIDEO BRONCHOSCOPY WITH ENDOBRONCHIAL NAVIGATION;  Surgeon: Melrose Nakayama, MD;  Location: [facility];  Service: Thoracic;  Laterality: N/A;  . VIDEO BRONCHOSCOPY WITH ENDOBRONCHIAL ULTRASOUND N/A 01/21/2017   Procedure: VIDEO BRONCHOSCOPY WITH ENDOBRONCHIAL ULTRASOUND;  Surgeon: Melrose Nakayama, MD;  Location: [employer] [facility];  Service: Thoracic;  Laterality: N/A;    REVIEW OF SYSTEMS:  A comprehensive review of systems was negative except for: Respiratory: positive for pleurisy/chest pain   PHYSICAL EXAMINATION: General appearance: alert, cooperative, fatigued and no distress Head: Normocephalic, without obvious abnormality, atraumatic Neck: no adenopathy, no JVD, supple, symmetrical, trachea midline and thyroid not enlarged, symmetric, no tenderness/mass/nodules Lymph nodes: Cervical, supraclavicular, and axillary nodes normal. Resp: clear to auscultation bilaterally Back: symmetric, no curvature. ROM normal. No CVA tenderness. Cardio: regular rate and rhythm, S1, S2 normal, no murmur, click, rub or gallop GI: soft, non-tender; bowel sounds normal; no masses,  no organomegaly Extremities: extremities normal, atraumatic, no cyanosis or edema  ECOG PERFORMANCE STATUS: 1 - Symptomatic but completely ambulatory  Blood pressure (!) 149/61, pulse 64, temperature 97.8 F (36.6 C), temperature source Oral, resp. rate 18, height _0  (1.753 m), weight 184 lb 8 oz (83.7 kg), SpO2 97 %.  LABORATORY DATA: Lab Results  Component Value Date   WBC 5.7 06/05/2018   HGB 10.5 (L) 06/05/2018   HCT 33.2 (L) 06/05/2018   MCV 81.2 06/05/2018   PLT 279 06/05/2018      Chemistry      Component Value Date/Time     NA 140 06/05/2018 0815   NA 140 06/02/2017 1322   K 5.2 (H) 06/05/2018 0815   K 4.2 06/02/2017 1322   CL 111 06/05/2018 0815   CO2 21 (L) 06/05/2018 0815   CO2 20 (L) 06/02/2017 1322   BUN 22 06/05/2018 0815   BUN 22.9 06/02/2017 1322   CREATININE 1.11 (H) 06/05/2018 0815   CREATININE 1.2 (H) 06/02/2017 1322      Component Value Date/Time   CALCIUM 9.3 06/05/2018 0815   CALCIUM 9.3 06/02/2017 1322   ALKPHOS 48 06/05/2018 0815   ALKPHOS 48 06/02/2017 1322   AST 12 (L) 06/05/2018 0815   AST 11 06/02/2017 1322  ALT 7 06/05/2018 0815   ALT 6 06/02/2017 1322   BILITOT 0.2 (L) 06/05/2018 0815   BILITOT 0.36 06/02/2017 1322       RADIOGRAPHIC STUDIES: Ct Chest W Contrast  Result Date: 06/05/2018 CLINICAL DATA:  83 year old female with history of lung cancer, status post left upper lobectomy. EXAM: CT CHEST WITH CONTRAST TECHNIQUE: Multidetector CT imaging of the chest was performed during intravenous contrast administration. CONTRAST:  65m OMNIPAQUE IOHEXOL 300 MG/ML  SOLN COMPARISON:  Chest CT 11/30/2017. FINDINGS: Cardiovascular: Heart size is mildly enlarged. There is no significant pericardial fluid, thickening or pericardial calcification. There is aortic atherosclerosis, as well as atherosclerosis of the great vessels of the mediastinum and the coronary arteries, including calcified atherosclerotic plaque in the left main, left anterior descending, left circumflex and right coronary arteries. Mediastinum/Nodes: Multiple prominent borderline enlarged mediastinal and hilar lymph nodes are noted, measuring up to 12 mm in short axis in the low right paratracheal nodal station, similar to the prior examination. Esophagus is unremarkable in appearance. No axillary lymphadenopathy. Lungs/Pleura: Status post left upper lobectomy. 6 mm subpleural nodule in the medial right upper lobe (axial image fifty-five of series 7) is unchanged dating back to 12/30/2016. In addition, there are a few  scattered 2-3 mm pulmonary nodules are noted throughout the lungs bilaterally, stable compared to the prior study, nonspecific but favored to be benign. No larger more suspicious appearing pulmonary nodules or masses are noted. No acute consolidative airspace disease. No pleural effusions. Diffuse bronchial wall thickening with moderate centrilobular and paraseptal emphysema. Upper Abdomen: Mild intrahepatic biliary ductal dilatation noted in the visualized portions of the liver, similar to prior studies. Aortic atherosclerosis. Musculoskeletal: There are no aggressive appearing lytic or blastic lesions noted in the visualized portions of the skeleton. IMPRESSION: 1. Status post left upper lobectomy. Small pulmonary nodules are stable compared to the prior studies, as above and favored to be benign. No definite evidence of locally recurrent or metastatic disease noted in the thorax. 2. Diffuse bronchial wall thickening with moderate centrilobular and paraseptal emphysema; imaging findings suggestive of underlying COPD. 3. Aortic atherosclerosis, in addition to left main and 3 vessel coronary artery disease. Aortic Atherosclerosis (ICD10-I70.0) and Emphysema (ICD10-J43.9). Electronically Signed   By: DVinnie LangtonM.D.   On: 06/05/2018 12:30    ASSESSMENT AND PLAN: This is a very pleasant 83years old African-American female with a stage Ia non-small cell lung cancer status post left upper lobectomy with lymph node dissection.   The patient is currently and she is feeling fine with no concerning complaints. Repeat CT scan of the chest performed recently showed no concerning findings for disease recurrence. I discussed the scan results with the patient and her daughter and recommended for her to continue on observation with repeat CT scan of the chest in 6 months. For hypertension, she will continue with her current blood pressure medication and monitor it closely at home. The patient was advised to call  immediately if she has any concerning symptoms in the interval. The patient voices understanding of current disease status and treatment options and is in agreement with the current care plan.  All questions were answered. The patient knows to call the clinic with any problems, questions or concerns. We can certainly see the patient much sooner if necessary.  I spent 10 minutes counseling the patient face to face. The total time spent in the appointment was 15 minutes.  Disclaimer: This note was dictated with voice recognition software. Similar sounding  words can inadvertently be transcribed and may not be corrected upon review.

## 2018-08-28 ENCOUNTER — Other Ambulatory Visit: Payer: Self-pay | Admitting: Thoracic Surgery (Cardiothoracic Vascular Surgery)

## 2018-08-28 DIAGNOSIS — Z902 Acquired absence of lung [part of]: Secondary | ICD-10-CM

## 2018-08-29 ENCOUNTER — Ambulatory Visit (INDEPENDENT_AMBULATORY_CARE_PROVIDER_SITE_OTHER): Payer: Medicare Other | Admitting: Thoracic Surgery (Cardiothoracic Vascular Surgery)

## 2018-08-29 ENCOUNTER — Ambulatory Visit
Admission: RE | Admit: 2018-08-29 | Discharge: 2018-08-29 | Disposition: A | Discharge status: Home / Self Care | Payer: Medicare Other | Source: Ambulatory Visit | Attending: Thoracic Surgery (Cardiothoracic Vascular Surgery) | Admitting: Thoracic Surgery (Cardiothoracic Vascular Surgery)

## 2018-08-29 VITALS — BP 130/63 | HR 72 | Resp 20 | Ht 69.0 in | Wt 180.0 lb

## 2018-08-29 DIAGNOSIS — Z902 Acquired absence of lung [part of]: Secondary | ICD-10-CM

## 2018-08-29 DIAGNOSIS — C3492 Malignant neoplasm of unspecified part of left bronchus or lung: Secondary | ICD-10-CM

## 2018-08-29 NOTE — Progress Notes (Signed)
[address]       [place] [address]             [phone]      HPI: [name] [name] returns for scheduled follow-up visit  [name] [name] is an 84 year old woman with a past history of tobacco abuse (quit August 2018) who had a left upper lobectomy for stage I a squamous cell carcinoma in September 2018.  She did not require adjuvant therapy.  She has been followed by Dr. Julien Nordmann since her surgery.  I saw her back in July 2019.  She complained that her appetite had never come back to normal.  She also complained of a "lump" at her left costal margin.  She says that her appetite still is not back to her baseline however her weight is stable.  She still has some discomfort along the costal margin, but is not as significant as it was before.  Sometimes aggravated by certain bras that she wears.   Past Medical History:  Diagnosis Date  . Anginal pain ([place])   . Arthritis   . Asthma   . Coronary artery disease   . Diabetes mellitus   . Dyspnea   . Early cataracts, bilateral   . GERD (gastroesophageal reflux disease)    occ  . Hypertension   . Lung cancer ([place])   . Mass of upper lobe of left lung    mediastinal adenopathy  . Palpitations   . Pneumonia   . Stroke [facility])    mini stroke  . Wears dentures   . Wears glasses     Current Outpatient Medications  Medication Sig Dispense Refill  . amLODipine (NORVASC) 10 MG tablet Take 10 mg by mouth daily.      Marland Kitchen AQUALANCE LANCETS 30G MISC     . aspirin EC 81 MG tablet Take 81 mg by mouth daily.      Marland Kitchen atorvastatin (LIPITOR) 20 MG tablet     . Blood Glucose Calibration (OT ULTRA/FASTTK CNTRL SOLN) SOLN     . Blood Glucose Monitoring Suppl (ONE TOUCH ULTRA 2) w/Device KIT     . Cholecalciferol (VITAMIN D3 PO) Take 1 tablet by mouth daily.    . ergocalciferol (VITAMIN D2) 50000 units capsule ergocalciferol (vitamin D2) 50,000 unit capsule    . guaifenesin (ROBITUSSIN) 100 MG/5ML syrup Take 200 mg by mouth 3 (three)  times daily as needed for cough.    . hydrALAZINE (APRESOLINE) 25 MG tablet Take 25 mg by mouth 2 (two) times daily.     . isosorbide dinitrate (ISORDIL) 20 MG tablet Take 20 mg by mouth 2 (two) times daily.     Elmore Guise Devices (ADJUSTABLE LANCING DEVICE) MISC     . linagliptin (TRADJENTA) 5 MG TABS tablet Tradjenta 5 mg tablet    . losartan (COZAAR) 50 MG tablet Take 50 mg by mouth daily.    . meclizine (ANTIVERT) 25 MG tablet Take 25 mg by mouth 2 (two) times daily as needed for dizziness.    . metFORMIN (GLUCOPHAGE) 500 MG tablet Take 500 mg by mouth 2 (two) times daily.     . metoprolol succinate (TOPROL-XL) 25 MG 24 hr tablet Take 25 mg by mouth every evening.     . montelukast (SINGULAIR) 10 MG tablet montelukast 10 mg tablet    . nitroGLYCERIN (NITROSTAT) 0.4 MG SL tablet Place 0.4 mg under the tongue every 5 (five) minutes as needed for chest pain.    Marland Kitchen  OMEPRAZOLE PO Take by mouth every other day. ALTERNATE WITH TUMS    . ONE TOUCH ULTRA TEST test strip     . pioglitazone (ACTOS) 30 MG tablet Take 30 mg by mouth every evening.     . traMADol (ULTRAM) 50 MG tablet tramadol 50 mg tablet    . valsartan (DIOVAN) 160 MG tablet TK 1 T PO QD  5   No current facility-administered medications for this visit.     Physical Exam BP 130/63   Pulse 72   Resp 20   Ht _0  (1.753 m)   Wt 180 lb (81.6 kg)   SpO2 94% Comment: RA  BMI 26.63 kg/m  84 year old woman in no acute distress Alert and oriented x3 with no focal deficits Cardiac regular rate and rhythm normal S1 and S2 Incisions well-healed Lungs diminished at left base, otherwise clear No change along left costal margin  Diagnostic Tests: CHEST - 2 VIEW  COMPARISON:  02/28/2018  FINDINGS: Cardiomegaly. Unchanged emphysema and chronic interstitial change with slight elevation of the left hemidiaphragm in keeping prior left upper lobectomy. Disc degenerative disease of the thoracic spine.  IMPRESSION: Unchanged  emphysema and chronic interstitial change with slight elevation of the left hemidiaphragm in keeping prior left upper lobectomy. No acute appearing airspace opacity. Cardiomegaly.   Electronically Signed   By: Eddie Candle M.D.   On: 08/29/2018 11:35 I personally reviewed the chest x-ray and concur with the findings noted above  Impression: [name] [name] is an 84 year old woman who had a thoracoscopic left upper lobectomy for a stage I a squamous cell carcinoma and September 2018.  She is now about a year and a half out from surgery with no evidence of recurrent disease.  She continues to complain of her appetite never coming back to normal.  She had lost a significant amount of weight initially after surgery, but that has leveled off.  She also continues to complain of a lump along the left costal margin although this really is just the edge of the rib.  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  She is not having any severe pain.  Tobacco abuse-quit smoking prior to surgery.  Plan:  Continue to follow-up with Dr. Julien Nordmann.   I will be happy to see her back anytime in the future if I can be of any further assistance with her care.  Melrose Nakayama, MD [facility] [phone]

## 2018-11-29 ENCOUNTER — Other Ambulatory Visit: Payer: Self-pay

## 2018-11-29 ENCOUNTER — Encounter (HOSPITAL_COMMUNITY): Payer: Self-pay | Admitting: *Deleted

## 2018-11-29 ENCOUNTER — Emergency Department (HOSPITAL_COMMUNITY): Payer: Medicare HMO

## 2018-11-29 ENCOUNTER — Inpatient Hospital Stay (HOSPITAL_COMMUNITY)
Admission: EM | Admit: 2018-11-29 | Discharge: 2018-12-10 | DRG: 065 | Disposition: A | Discharge status: Home Health | Payer: Medicare HMO | Attending: Neurosurgery | Admitting: Neurosurgery

## 2018-11-29 DIAGNOSIS — Z87891 Personal history of nicotine dependence: Secondary | ICD-10-CM

## 2018-11-29 DIAGNOSIS — Z7982 Long term (current) use of aspirin: Secondary | ICD-10-CM | POA: Diagnosis not present

## 2018-11-29 DIAGNOSIS — Z96651 Presence of right artificial knee joint: Secondary | ICD-10-CM | POA: Diagnosis present

## 2018-11-29 DIAGNOSIS — Z972 Presence of dental prosthetic device (complete) (partial): Secondary | ICD-10-CM | POA: Diagnosis not present

## 2018-11-29 DIAGNOSIS — M1990 Unspecified osteoarthritis, unspecified site: Secondary | ICD-10-CM | POA: Diagnosis present

## 2018-11-29 DIAGNOSIS — I161 Hypertensive emergency: Secondary | ICD-10-CM | POA: Diagnosis present

## 2018-11-29 DIAGNOSIS — N179 Acute kidney failure, unspecified: Secondary | ICD-10-CM | POA: Diagnosis not present

## 2018-11-29 DIAGNOSIS — J449 Chronic obstructive pulmonary disease, unspecified: Secondary | ICD-10-CM | POA: Diagnosis present

## 2018-11-29 DIAGNOSIS — Z8349 Family history of other endocrine, nutritional and metabolic diseases: Secondary | ICD-10-CM

## 2018-11-29 DIAGNOSIS — Z8673 Personal history of transient ischemic attack (TIA), and cerebral infarction without residual deficits: Secondary | ICD-10-CM

## 2018-11-29 DIAGNOSIS — E875 Hyperkalemia: Secondary | ICD-10-CM | POA: Diagnosis not present

## 2018-11-29 DIAGNOSIS — E1151 Type 2 diabetes mellitus with diabetic peripheral angiopathy without gangrene: Secondary | ICD-10-CM | POA: Diagnosis present

## 2018-11-29 DIAGNOSIS — E785 Hyperlipidemia, unspecified: Secondary | ICD-10-CM | POA: Diagnosis present

## 2018-11-29 DIAGNOSIS — E1169 Type 2 diabetes mellitus with other specified complication: Secondary | ICD-10-CM | POA: Diagnosis not present

## 2018-11-29 DIAGNOSIS — K08409 Partial loss of teeth, unspecified cause, unspecified class: Secondary | ICD-10-CM | POA: Diagnosis present

## 2018-11-29 DIAGNOSIS — E119 Type 2 diabetes mellitus without complications: Secondary | ICD-10-CM

## 2018-11-29 DIAGNOSIS — E871 Hypo-osmolality and hyponatremia: Secondary | ICD-10-CM | POA: Diagnosis not present

## 2018-11-29 DIAGNOSIS — N189 Chronic kidney disease, unspecified: Secondary | ICD-10-CM | POA: Diagnosis present

## 2018-11-29 DIAGNOSIS — I129 Hypertensive chronic kidney disease with stage 1 through stage 4 chronic kidney disease, or unspecified chronic kidney disease: Secondary | ICD-10-CM | POA: Diagnosis present

## 2018-11-29 DIAGNOSIS — Z880 Allergy status to penicillin: Secondary | ICD-10-CM

## 2018-11-29 DIAGNOSIS — Z7984 Long term (current) use of oral hypoglycemic drugs: Secondary | ICD-10-CM

## 2018-11-29 DIAGNOSIS — Z8701 Personal history of pneumonia (recurrent): Secondary | ICD-10-CM | POA: Diagnosis not present

## 2018-11-29 DIAGNOSIS — I2510 Atherosclerotic heart disease of native coronary artery without angina pectoris: Secondary | ICD-10-CM | POA: Diagnosis present

## 2018-11-29 DIAGNOSIS — E1122 Type 2 diabetes mellitus with diabetic chronic kidney disease: Secondary | ICD-10-CM | POA: Diagnosis present

## 2018-11-29 DIAGNOSIS — K219 Gastro-esophageal reflux disease without esophagitis: Secondary | ICD-10-CM | POA: Diagnosis present

## 2018-11-29 DIAGNOSIS — T501X5A Adverse effect of loop [high-ceiling] diuretics, initial encounter: Secondary | ICD-10-CM | POA: Diagnosis not present

## 2018-11-29 DIAGNOSIS — R29701 NIHSS score 1: Secondary | ICD-10-CM | POA: Diagnosis present

## 2018-11-29 DIAGNOSIS — Z902 Acquired absence of lung [part of]: Secondary | ICD-10-CM

## 2018-11-29 DIAGNOSIS — I739 Peripheral vascular disease, unspecified: Secondary | ICD-10-CM | POA: Diagnosis present

## 2018-11-29 DIAGNOSIS — I609 Nontraumatic subarachnoid hemorrhage, unspecified: Secondary | ICD-10-CM | POA: Diagnosis present

## 2018-11-29 DIAGNOSIS — Z87892 Personal history of anaphylaxis: Secondary | ICD-10-CM | POA: Diagnosis not present

## 2018-11-29 DIAGNOSIS — Z90710 Acquired absence of both cervix and uterus: Secondary | ICD-10-CM

## 2018-11-29 DIAGNOSIS — E1159 Type 2 diabetes mellitus with other circulatory complications: Secondary | ICD-10-CM | POA: Diagnosis not present

## 2018-11-29 DIAGNOSIS — N390 Urinary tract infection, site not specified: Secondary | ICD-10-CM | POA: Diagnosis not present

## 2018-11-29 DIAGNOSIS — Z85118 Personal history of other malignant neoplasm of bronchus and lung: Secondary | ICD-10-CM

## 2018-11-29 DIAGNOSIS — I10 Essential (primary) hypertension: Secondary | ICD-10-CM | POA: Diagnosis not present

## 2018-11-29 DIAGNOSIS — Z833 Family history of diabetes mellitus: Secondary | ICD-10-CM

## 2018-11-29 DIAGNOSIS — Z79899 Other long term (current) drug therapy: Secondary | ICD-10-CM | POA: Diagnosis not present

## 2018-11-29 DIAGNOSIS — Z885 Allergy status to narcotic agent status: Secondary | ICD-10-CM

## 2018-11-29 LAB — CBC WITH DIFFERENTIAL/PLATELET
Abs Immature Granulocytes: 0.02 10*3/uL (ref 0.00–0.07)
Basophils Absolute: 0 10*3/uL (ref 0.0–0.1)
Basophils Relative: 0 %
Eosinophils Absolute: 0.1 10*3/uL (ref 0.0–0.5)
Eosinophils Relative: 2 %
HCT: 32.4 % — ABNORMAL LOW (ref 36.0–46.0)
Hemoglobin: 10.4 g/dL — ABNORMAL LOW (ref 12.0–15.0)
Immature Granulocytes: 0 %
Lymphocytes Relative: 25 %
Lymphs Abs: 1.5 10*3/uL (ref 0.7–4.0)
MCH: 26.1 pg (ref 26.0–34.0)
MCHC: 32.1 g/dL (ref 30.0–36.0)
MCV: 81.2 fL (ref 80.0–100.0)
Monocytes Absolute: 0.4 10*3/uL (ref 0.1–1.0)
Monocytes Relative: 7 %
Neutro Abs: 3.8 10*3/uL (ref 1.7–7.7)
Neutrophils Relative %: 66 %
Platelets: 217 10*3/uL (ref 150–400)
RBC: 3.99 MIL/uL (ref 3.87–5.11)
RDW: 15.4 % (ref 11.5–15.5)
WBC: 5.9 10*3/uL (ref 4.0–10.5)
nRBC: 0 % (ref 0.0–0.2)

## 2018-11-29 LAB — BASIC METABOLIC PANEL
Anion gap: 8 (ref 5–15)
BUN: 15 mg/dL (ref 8–23)
CO2: 22 mmol/L (ref 22–32)
Calcium: 8.9 mg/dL (ref 8.9–10.3)
Chloride: 110 mmol/L (ref 98–111)
Creatinine, Ser: 1.01 mg/dL — ABNORMAL HIGH (ref 0.44–1.00)
GFR calc Af Amer: >60 mL/min (ref >60)
GFR calc non Af Amer: 52 mL/min — ABNORMAL LOW (ref >60)
Glucose, Bld: 145 mg/dL — ABNORMAL HIGH (ref 70–99)
Potassium: 4.4 mmol/L (ref 3.5–5.1)
Sodium: 140 mmol/L (ref 135–145)

## 2018-11-29 LAB — GLUCOSE, CAPILLARY: Glucose-Capillary: 138 mg/dL — ABNORMAL HIGH (ref 70–99)

## 2018-11-29 MED ORDER — AMLODIPINE BESYLATE 10 MG PO TABS
10.0000 mg | ORAL_TABLET | Freq: Every day | ORAL | Status: DC
  Administered 2018-11-30 – 2018-12-10 (×11): 10 mg via ORAL
  Filled 2018-11-29 (×2): qty 2
  Filled 2018-11-29: qty 1
  Filled 2018-11-29: qty 2
  Filled 2018-11-29 (×4): qty 1
  Filled 2018-11-29: qty 2
  Filled 2018-11-29 (×2): qty 1

## 2018-11-29 MED ORDER — INSULIN ASPART 100 UNIT/ML ~~LOC~~ SOLN
0.0000 [IU] | SUBCUTANEOUS | Status: DC
  Administered 2018-11-30: 04:00:00 1 [IU] via SUBCUTANEOUS
  Administered 2018-11-30: 20:00:00 2 [IU] via SUBCUTANEOUS
  Administered 2018-11-30 – 2018-12-01 (×5): 1 [IU] via SUBCUTANEOUS
  Administered 2018-12-01 (×2): 2 [IU] via SUBCUTANEOUS
  Administered 2018-12-02: 1 [IU] via SUBCUTANEOUS
  Administered 2018-12-02: 3 [IU] via SUBCUTANEOUS
  Administered 2018-12-03: 20:00:00 1 [IU] via SUBCUTANEOUS
  Administered 2018-12-03: 3 [IU] via SUBCUTANEOUS
  Administered 2018-12-03 – 2018-12-04 (×3): 1 [IU] via SUBCUTANEOUS
  Administered 2018-12-04: 01:00:00 2 [IU] via SUBCUTANEOUS
  Administered 2018-12-04: 1 [IU] via SUBCUTANEOUS
  Administered 2018-12-05 (×2): 3 [IU] via SUBCUTANEOUS
  Administered 2018-12-05: 2 [IU] via SUBCUTANEOUS
  Administered 2018-12-05: 1 [IU] via SUBCUTANEOUS
  Administered 2018-12-05: 2 [IU] via SUBCUTANEOUS
  Administered 2018-12-05: 3 [IU] via SUBCUTANEOUS
  Administered 2018-12-06: 18:00:00 1 [IU] via SUBCUTANEOUS
  Administered 2018-12-06: 2 [IU] via SUBCUTANEOUS
  Administered 2018-12-06: 3 [IU] via SUBCUTANEOUS
  Administered 2018-12-06 – 2018-12-07 (×4): 2 [IU] via SUBCUTANEOUS
  Administered 2018-12-07 – 2018-12-08 (×2): 1 [IU] via SUBCUTANEOUS
  Administered 2018-12-08: 05:00:00 2 [IU] via SUBCUTANEOUS
  Administered 2018-12-08 – 2018-12-09 (×2): 1 [IU] via SUBCUTANEOUS
  Administered 2018-12-09 (×2): 2 [IU] via SUBCUTANEOUS
  Administered 2018-12-10: 1 [IU] via SUBCUTANEOUS
  Administered 2018-12-10: 2 [IU] via SUBCUTANEOUS

## 2018-11-29 MED ORDER — METHOCARBAMOL 1000 MG/10ML IJ SOLN: 1000.0000 mg | Freq: Once | INTRAMUSCULAR | Status: DC

## 2018-11-29 MED ORDER — METHOCARBAMOL 1000 MG/10ML IJ SOLN
1000.0000 mg | Freq: Once | INTRAVENOUS | Status: AC
  Administered 2018-11-29: 17:00:00 1000 mg via INTRAVENOUS
  Filled 2018-11-29: qty 10

## 2018-11-29 MED ORDER — SODIUM CHLORIDE 0.9 % IV SOLN: INTRAVENOUS | Status: DC

## 2018-11-29 MED ORDER — MONTELUKAST SODIUM 10 MG PO TABS
10.0000 mg | ORAL_TABLET | Freq: Every day | ORAL | Status: DC
  Administered 2018-12-01 – 2018-12-09 (×9): 10 mg via ORAL
  Filled 2018-11-29 (×10): qty 1

## 2018-11-29 MED ORDER — ATORVASTATIN CALCIUM 10 MG PO TABS
20.0000 mg | ORAL_TABLET | Freq: Every day | ORAL | Status: DC
  Administered 2018-11-30 – 2018-12-09 (×10): 20 mg via ORAL
  Filled 2018-11-29 (×10): qty 2

## 2018-11-29 MED ORDER — METOPROLOL TARTRATE 12.5 MG HALF TABLET
12.5000 mg | ORAL_TABLET | Freq: Two times a day (BID) | ORAL | Status: DC
  Administered 2018-11-29 – 2018-12-10 (×18): 12.5 mg via ORAL
  Filled 2018-11-29 (×22): qty 1

## 2018-11-29 MED ORDER — HYDRALAZINE HCL 25 MG PO TABS
25.0000 mg | ORAL_TABLET | Freq: Two times a day (BID) | ORAL | Status: DC
  Administered 2018-11-29 – 2018-12-10 (×22): 25 mg via ORAL
  Filled 2018-11-29 (×23): qty 1

## 2018-11-29 MED ORDER — NICARDIPINE HCL IN NACL 20-0.86 MG/200ML-% IV SOLN
3.0000 mg/h | INTRAVENOUS | Status: DC
  Administered 2018-11-29: 23:00:00 10 mg/h via INTRAVENOUS
  Administered 2018-11-29: 20:00:00 5 mg/h via INTRAVENOUS
  Administered 2018-11-30: 06:00:00 7.5 mg/h via INTRAVENOUS
  Filled 2018-11-29 (×3): qty 200

## 2018-11-29 MED ORDER — NIMODIPINE 30 MG PO CAPS
60.0000 mg | ORAL_CAPSULE | ORAL | Status: DC
  Administered 2018-11-29 – 2018-12-10 (×63): 60 mg via ORAL
  Filled 2018-11-29 (×66): qty 2

## 2018-11-29 MED ORDER — ISOSORBIDE DINITRATE 20 MG PO TABS
20.0000 mg | ORAL_TABLET | Freq: Two times a day (BID) | ORAL | Status: DC
  Administered 2018-11-29 – 2018-12-10 (×22): 20 mg via ORAL
  Filled 2018-11-29 (×23): qty 1

## 2018-11-29 MED ORDER — STROKE: EARLY STAGES OF RECOVERY BOOK
Freq: Once | Status: AC
  Administered 2018-11-29

## 2018-11-29 MED ORDER — LOSARTAN POTASSIUM 50 MG PO TABS
50.0000 mg | ORAL_TABLET | Freq: Every day | ORAL | Status: DC
  Administered 2018-11-30 – 2018-12-07 (×8): 50 mg via ORAL
  Filled 2018-11-29 (×8): qty 1

## 2018-11-29 MED ORDER — METOCLOPRAMIDE HCL 5 MG/ML IJ SOLN
10.0000 mg | Freq: Once | INTRAMUSCULAR | Status: AC
  Administered 2018-11-29: 17:00:00 10 mg via INTRAVENOUS
  Filled 2018-11-29: qty 2

## 2018-11-29 MED ORDER — IOHEXOL 350 MG/ML SOLN
100.0000 mL | Freq: Once | INTRAVENOUS | Status: AC | PRN
  Administered 2018-11-29: 18:00:00 100 mL via INTRAVENOUS

## 2018-11-29 MED ORDER — NIMODIPINE 60 MG/20ML PO SOLN
60.0000 mg | ORAL | Status: DC
  Filled 2018-11-29 (×15): qty 20

## 2018-11-29 NOTE — Consult Note (Signed)
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 Recommend q1h neuro checks, SBP<160, and NPO after midnight. Dr. Kathyrn Sheriff will perform a catheter angiogram either tomorrow morning or 4/30, will see the patient and update recs accordingly.

## 2018-11-29 NOTE — ED Provider Notes (Addendum)
[facility] EMERGENCY DEPARTMENT Provider Note   CSN: [id number] Arrival date & time: 11/29/18  1421    History   Chief Complaint Chief Complaint  Patient presents with   Headache    HPI [name] [name] is a 84 y.o. female with h/o non small cell lung cancer s/p left upper lobectomy with lymph node dissection, HLD, HTN, renal insufficiency is here for evaluation of headache associated with neck pain, photophobia. Sudden onset when she woke up this morning around 8 am. Moderate to severe.  Her headache is generalized throbbing, mostly left scalp around ear.  Her neck pain is described as pulling, tight, left sided, constant. Her neck pain is worse than the headache. Neck pain worse with movement of head, laying back.  She took extra strength excedrin without relief.  Noticed a second of buzzing in her right ear this morning. No alleviating factors. States many years ago she used to have neck spasms and took muscle relaxers for it but this feels different.    No head trauma or recent falls, changes in sleeping patter/pillows, different exertional activities.  No fever, chills, vision changes, difficulty swallowing, difficulty with walking or balance or speech, unilateral weakness. No upper extremity tingling or numbness.      HPI  Past Medical History:  Diagnosis Date   Anginal pain ([place])    Arthritis    Asthma    Coronary artery disease    Diabetes mellitus    Dyspnea    Early cataracts, bilateral    GERD (gastroesophageal reflux disease)    occ   Hypertension    Lung cancer ([place])    Mass of upper lobe of left lung    mediastinal adenopathy   Palpitations    Pneumonia    Stroke [facility])    mini stroke   Wears dentures    Wears glasses     Patient Active Problem List   Diagnosis Date Noted   S/P lobectomy of lung 04/21/2017   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 ([place]) 02/16/2017   Solitary pulmonary nodule  02/08/2017   Shortness of breath 12/29/2016    Class: Acute   Peripheral vascular disease, unspecified ([place]) 07/12/2013   Atherosclerosis of native arteries of the extremities with intermittent claudication 07/12/2013   Chest pain, cardiac 06/08/2011    Class: Acute   Diabetes mellitus ([place]) 06/08/2011    Class: History of   HTN (hypertension), benign 06/08/2011    Class: Chronic   Obesity (BMI 30-39.9) 06/08/2011    Class: History of   Coronary artery disease 06/08/2011    Class: History of    Past Surgical History:  Procedure Laterality Date   ABDOMINAL HYSTERECTOMY     ANKLE SURGERY Left    fusion   COLONOSCOPY W/ BIOPSIES AND POLYPECTOMY     MULTIPLE TOOTH EXTRACTIONS     TOTAL KNEE ARTHROPLASTY Right    VIDEO ASSISTED THORACOSCOPY (VATS)/ LOBECTOMY Left 04/21/2017   Procedure: VIDEO ASSISTED THORACOSCOPY (VATS)/LEFT UPPER LOBECTOMY;  Surgeon: Melrose Nakayama, MD;  Location: [facility];  Service: Thoracic;  Laterality: Left;   VIDEO BRONCHOSCOPY WITH ENDOBRONCHIAL NAVIGATION N/A 01/21/2017   Procedure: VIDEO BRONCHOSCOPY WITH ENDOBRONCHIAL NAVIGATION;  Surgeon: Melrose Nakayama, MD;  Location: [facility];  Service: Thoracic;  Laterality: N/A;   VIDEO BRONCHOSCOPY WITH ENDOBRONCHIAL ULTRASOUND N/A 01/21/2017   Procedure: VIDEO BRONCHOSCOPY WITH ENDOBRONCHIAL ULTRASOUND;  Surgeon: Melrose Nakayama, MD;  Location: [facility];  Service: Thoracic;  Laterality: N/A;  OB History   No obstetric history on file.      Home Medications    Prior to Admission medications   Medication Sig Start Date End Date Taking? Authorizing Provider  amLODipine (NORVASC) 10 MG tablet Take 10 mg by mouth daily.      [provider]  AQUALANCE LANCETS 30G [place]  11/02/17   [provider]  aspirin EC 81 MG tablet Take 81 mg by mouth daily.      [provider]  atorvastatin (LIPITOR) 20 MG tablet  11/16/17   [provider]  Blood Glucose  Calibration (OT ULTRA/FASTTK CNTRL SOLN) SOLN  11/02/17   [provider]  Blood Glucose Monitoring Suppl (ONE TOUCH ULTRA 2) w/Device KIT  11/02/17   [provider]  Cholecalciferol (VITAMIN D3 PO) Take 1 tablet by mouth daily.    [provider]  ergocalciferol (VITAMIN D2) 50000 units capsule ergocalciferol (vitamin D2) 50,000 unit capsule    [provider]  guaifenesin (ROBITUSSIN) 100 MG/5ML syrup Take 200 mg by mouth 3 (three) times daily as needed for cough.    [provider]  hydrALAZINE (APRESOLINE) 25 MG tablet Take 25 mg by mouth 2 (two) times daily.     [provider]  isosorbide dinitrate (ISORDIL) 20 MG tablet Take 20 mg by mouth 2 (two) times daily.     [provider]  Lancet Devices (ADJUSTABLE LANCING DEVICE) MISC  11/02/17   [provider]  linagliptin (TRADJENTA) 5 MG TABS tablet Tradjenta 5 mg tablet    [provider]  losartan (COZAAR) 50 MG tablet Take 50 mg by mouth daily.    [provider]  meclizine (ANTIVERT) 25 MG tablet Take 25 mg by mouth 2 (two) times daily as needed for dizziness.    [provider]  metFORMIN (GLUCOPHAGE) 500 MG tablet Take 500 mg by mouth 2 (two) times daily.     [provider]  metoprolol succinate (TOPROL-XL) 25 MG 24 hr tablet Take 25 mg by mouth every evening.     [provider]  montelukast (SINGULAIR) 10 MG tablet montelukast 10 mg tablet    [provider]  nitroGLYCERIN (NITROSTAT) 0.4 MG SL tablet Place 0.4 mg under the tongue every 5 (five) minutes as needed for chest pain.    [provider]  OMEPRAZOLE PO Take by mouth every other day. ALTERNATE WITH TUMS    [provider]  ONE TOUCH ULTRA TEST test strip  11/02/17   [provider]  pioglitazone (ACTOS) 30 MG tablet Take 30 mg by mouth every evening.     [provider]  traMADol (ULTRAM) 50 MG tablet tramadol 50 mg tablet     [provider]  valsartan (DIOVAN) 160 MG tablet TK 1 T PO QD 11/29/17   [provider]    Family History Family History  Problem Relation Age of Onset   Diabetes Mother    Diabetes Sister    Hyperlipidemia Sister    Diabetes Brother    Diabetes Son    Cancer Neg Hx     Social History Social History   Tobacco Use   Smoking status: Former Smoker    Packs/day: 0.25    Years: 12.00    Pack years: 3.00    Types: Cigarettes    Last attempt to quit: 03/2017    Years since quitting: 1.7   Smokeless tobacco: Never Used  Substance Use Topics   Alcohol use:  No   Drug use: No     Allergies   Penicillins and Codeine   Review of Systems Review of Systems  Musculoskeletal: Positive for neck pain and neck stiffness.  Neurological: Positive for headaches.  All other systems reviewed and are negative.    Physical Exam Updated Vital Signs BP (!) 185/80    Pulse 80    Temp 98.5 F (36.9 C) (Oral)    Resp 19    Ht _0  (1.753 m)    Wt 81.2 kg    SpO2 100%    BMI 26.43 kg/m   Physical Exam Vitals signs and nursing note reviewed.  Constitutional:      Appearance: She is well-developed.     Comments: Looks uncomfortable, holding her left neck   HENT:     Head: Normocephalic and atraumatic.     Comments: No temporal tenderness. Atraumatic.     Right Ear: External ear normal.     Left Ear: External ear normal.     Nose: Nose normal.  Eyes:     General: No scleral icterus.    Conjunctiva/sclera: Conjunctivae normal.  Neck:     Musculoskeletal: Normal range of motion and neck supple. Pain with movement and muscular tenderness present.     Comments: No midline or right paraspinal tenderness. Moderate reproducible tenderness to left paraspinal muscles, top of trapezius and lateral left neck. Decreased flexion due to pain.  Question carotid bruit bilaterally. Trachea midline.  Cardiovascular:     Rate and Rhythm: Normal rate and regular rhythm.      Heart sounds: Normal heart sounds. No murmur.     Comments: 1+ radial and DP pulses bilaterally. Brisk cap refill distally to finger tips  Pulmonary:     Effort: Pulmonary effort is normal.     Breath sounds: Normal breath sounds. No wheezing.  Musculoskeletal: Normal range of motion.        General: No deformity.  Skin:    General: Skin is warm and dry.     Capillary Refill: Capillary refill takes less than 2 seconds.  Neurological:     Mental Status: She is alert and oriented to person, place, and time.     Comments: Speech is fluent without dysarthria or dysphasia. Strength 5/5 with hand grip and ankle F/E.   Sensation to light touch intact in face, hands and feet. No pronator drift. No leg drop. Normal finger-to-nose.  CN I not tested. CN II-XII symmetric and intact bilaterally  Psychiatric:        Behavior: Behavior normal.        Thought Content: Thought content normal.        Judgment: Judgment normal.      ED Treatments / Results  Labs (all labs ordered are listed, but only abnormal results are displayed) Labs Reviewed  CBC WITH DIFFERENTIAL/PLATELET - Abnormal; Notable for the following components:      Result Value   Hemoglobin 10.4 (*)    HCT 32.4 (*)    All other components within normal limits  BASIC METABOLIC PANEL - Abnormal; Notable for the following components:   Glucose, Bld 145 (*)    Creatinine, Ser 1.01 (*)    GFR calc non Af Amer 52 (*)    All other components within normal limits    EKG None  Radiology Ct Angio Head W Or Wo Contrast  Result Date: 11/29/2018 CLINICAL DATA:  Headache and neck pain beginning today. History of lung cancer. EXAM: CT ANGIOGRAPHY  HEAD AND NECK TECHNIQUE: Multidetector CT imaging of the head and neck was performed using the standard protocol during bolus administration of intravenous contrast. Multiplanar CT image reconstructions and MIPs were obtained to evaluate the vascular anatomy. Carotid stenosis measurements  (when applicable) are obtained utilizing NASCET criteria, using the distal internal carotid diameter as the denominator. CONTRAST:  152m OMNIPAQUE IOHEXOL 350 MG/ML SOLN COMPARISON:  CT 10/15/2015 FINDINGS: CT HEAD FINDINGS Brain: There is localized subarachnoid hemorrhage around the left side of the cervicomedullary junction. Tiny amount of dependent blood noted in the occipital horns of lateral ventricles and in the fourth ventricle. Evidence of intraparenchymal hemorrhage. I did consider the possibility of there could be some blood in the region of the pineal gland but think that is less likely. Brain parenchyma does not show accelerated atrophy for age. There are mild chronic small-vessel ischemic changes of the white matter. Vascular: There is atherosclerotic calcification of the major vessels at the base of the brain. Skull: Negative Sinuses: Mucosal thickening of the maxillary sinuses. Otherwise negative. Orbits: Normal Review of the MIP images confirms the above findings CTA NECK FINDINGS Aortic arch: Ordinary atherosclerosis. No evidence of aneurysm or dissection. Branching pattern is normal. Right carotid system: Common carotid artery widely patent to the bifurcation. Calcified plaque at the carotid bifurcation and ICA bulb. Proximal ICA stenosis 20%. Cervical ICA widely patent beyond that. Left carotid system: Common carotid artery widely patent to the bifurcation. Calcified plaque at the carotid bifurcation and ICA bulb. Minimal diameter of the proximal ICA is 2.5 cm. This indicates a 50% stenosis. Cervical ICA widely patent beyond that. Both internal carotid arteries are tortuous. Vertebral arteries: Both vertebral artery origins are widely patent. Both vertebral arteries appear widely patent through the cervical region to the foramen magnum. Skeleton: Ordinary cervical spondylosis. Other neck: No soft tissue neck mass or lymphadenopathy. Upper chest: Chronic prominent paratracheal lymph nodes, similar  to the previous PET scan June 2018, at which time these were not hypermetabolic. There is extensive/advanced emphysema in the upper lobes, right worse than left. Review of the MIP images confirms the above findings CTA HEAD FINDINGS Anterior circulation: Both internal carotid arteries are widely patent through the skull base and siphon regions. There is siphon atherosclerotic calcification but no stenosis greater than 20%. Anterior and middle cerebral vessels are patent without proximal stenosis, aneurysm or vascular malformation Posterior circulation: Both vertebral arteries are patent at the foramen magnum. In the region of the subarachnoid hemorrhage on the left at the cervicomedullary junction, there are some abnormal vessels that I believe arise from left PICA. This could either be an arteriovenous malformation or a dural AV fistula. This is likely the cause/source of the hemorrhage. Beyond that, the basilar artery is widely patent. Posterior circulation branch vessels appear otherwise normal. Venous sinuses: Patent and normal. Anatomic variants: None significant. Delayed phase: No abnormal enhancement. Review of the MIP images confirms the above findings IMPRESSION: Subarachnoid hemorrhage at the craniocervical junction on the left. Small amount migration of blood dependent within the occipital horns of the lateral ventricles and in the fourth ventricle. No hydrocephalus. There appears to be either an arteriovenous malformation or dural AV fistula in the region of the hemorrhage, probably supplied by the left PICA. This would be more accurately evaluated by catheter angiography. 50% stenosis of the proximal ICA on the left. 20% stenosis of the proximal ICA on the right Chronic mediastinal nodal prominence, similar to the PET scan 2018, at which time the vessels were  not hypermetabolic. Critical Value/emergent results were called by telephone at the time of interpretation on 11/29/2018 at 6:53 Pm to Dr. Carmon Sails , who verbally acknowledged these results. Electronically Signed   By: Nelson Chimes M.D.   On: 11/29/2018 19:01   Ct Angio Neck W And/or Wo Contrast  Result Date: 11/29/2018 CLINICAL DATA:  Headache and neck pain beginning today. History of lung cancer. EXAM: CT ANGIOGRAPHY HEAD AND NECK TECHNIQUE: Multidetector CT imaging of the head and neck was performed using the standard protocol during bolus administration of intravenous contrast. Multiplanar CT image reconstructions and MIPs were obtained to evaluate the vascular anatomy. Carotid stenosis measurements (when applicable) are obtained utilizing NASCET criteria, using the distal internal carotid diameter as the denominator. CONTRAST:  159m OMNIPAQUE IOHEXOL 350 MG/ML SOLN COMPARISON:  CT 10/15/2015 FINDINGS: CT HEAD FINDINGS Brain: There is localized subarachnoid hemorrhage around the left side of the cervicomedullary junction. Tiny amount of dependent blood noted in the occipital horns of lateral ventricles and in the fourth ventricle. Evidence of intraparenchymal hemorrhage. I did consider the possibility of there could be some blood in the region of the pineal gland but think that is less likely. Brain parenchyma does not show accelerated atrophy for age. There are mild chronic small-vessel ischemic changes of the white matter. Vascular: There is atherosclerotic calcification of the major vessels at the base of the brain. Skull: Negative Sinuses: Mucosal thickening of the maxillary sinuses. Otherwise negative. Orbits: Normal Review of the MIP images confirms the above findings CTA NECK FINDINGS Aortic arch: Ordinary atherosclerosis. No evidence of aneurysm or dissection. Branching pattern is normal. Right carotid system: Common carotid artery widely patent to the bifurcation. Calcified plaque at the carotid bifurcation and ICA bulb. Proximal ICA stenosis 20%. Cervical ICA widely patent beyond that. Left carotid system: Common carotid artery widely  patent to the bifurcation. Calcified plaque at the carotid bifurcation and ICA bulb. Minimal diameter of the proximal ICA is 2.5 cm. This indicates a 50% stenosis. Cervical ICA widely patent beyond that. Both internal carotid arteries are tortuous. Vertebral arteries: Both vertebral artery origins are widely patent. Both vertebral arteries appear widely patent through the cervical region to the foramen magnum. Skeleton: Ordinary cervical spondylosis. Other neck: No soft tissue neck mass or lymphadenopathy. Upper chest: Chronic prominent paratracheal lymph nodes, similar to the previous PET scan June 2018, at which time these were not hypermetabolic. There is extensive/advanced emphysema in the upper lobes, right worse than left. Review of the MIP images confirms the above findings CTA HEAD FINDINGS Anterior circulation: Both internal carotid arteries are widely patent through the skull base and siphon regions. There is siphon atherosclerotic calcification but no stenosis greater than 20%. Anterior and middle cerebral vessels are patent without proximal stenosis, aneurysm or vascular malformation Posterior circulation: Both vertebral arteries are patent at the foramen magnum. In the region of the subarachnoid hemorrhage on the left at the cervicomedullary junction, there are some abnormal vessels that I believe arise from left PICA. This could either be an arteriovenous malformation or a dural AV fistula. This is likely the cause/source of the hemorrhage. Beyond that, the basilar artery is widely patent. Posterior circulation branch vessels appear otherwise normal. Venous sinuses: Patent and normal. Anatomic variants: None significant. Delayed phase: No abnormal enhancement. Review of the MIP images confirms the above findings IMPRESSION: Subarachnoid hemorrhage at the craniocervical junction on the left. Small amount migration of blood dependent within the occipital horns of the lateral ventricles and in  the fourth  ventricle. No hydrocephalus. There appears to be either an arteriovenous malformation or dural AV fistula in the region of the hemorrhage, probably supplied by the left PICA. This would be more accurately evaluated by catheter angiography. 50% stenosis of the proximal ICA on the left. 20% stenosis of the proximal ICA on the right Chronic mediastinal nodal prominence, similar to the PET scan 2018, at which time the vessels were not hypermetabolic. Critical Value/emergent results were called by telephone at the time of interpretation on 11/29/2018 at 6:53 Pm to Dr. Carmon Sails , who verbally acknowledged these results. Electronically Signed   By: Nelson Chimes M.D.   On: 11/29/2018 19:01    Procedures Ultrasound ED Peripheral IV (Provider) Date/Time: 11/29/2018 4:48 PM Performed by: Kinnie Feil, PA-C Authorized by: Kinnie Feil, PA-C   Procedure details:    Indications: multiple failed IV attempts and poor IV access     Skin Prep: chlorhexidine gluconate     Location: right medial upper arm.   Angiocath:  20 G   Bedside Ultrasound Guided: Yes     Images: not archived     Patient tolerated procedure without complications: Yes     Dressing applied: Yes   Comments:     1 successful attempt 10 cc NS flushed without pain or swelling. Blood drawn for labs  .Critical Care Performed by: Kinnie Feil, PA-C Authorized by: Kinnie Feil, PA-C   Critical care provider statement:    Critical care time (minutes):  45   Critical care was necessary to treat or prevent imminent or life-threatening deterioration of the following conditions:  CNS failure or compromise (subarachnoid hemorrhage, nicardipine gtt)   Critical care was time spent personally by me on the following activities:  Discussions with consultants, evaluation of patient's response to treatment, examination of patient, ordering and performing treatments and interventions, ordering and review of laboratory studies,  ordering and review of radiographic studies, pulse oximetry, re-evaluation of patient's condition, obtaining history from patient or surrogate and review of old charts   I assumed direction of critical care for this patient from another provider in my specialty: no     (including critical care time)  Medications Ordered in ED Medications  nicardipine (CARDENE) 3m in 0.86% saline 2059mIV infusion (0.1 mg/ml) (5 mg/hr Intravenous New Bag/Given 11/29/18 1952)  metoCLOPramide (REGLAN) injection 10 mg (10 mg Intravenous Given 11/29/18 1720)  methocarbamol (ROBAXIN) 1,000 mg in dextrose 5 % 50 mL IVPB (0 mg Intravenous Stopped 11/29/18 1917)  iohexol (OMNIPAQUE) 350 MG/ML injection 100 mL (100 mLs Intravenous Contrast Given 11/29/18 1826)     Initial Impression / Assessment and Plan / ED Course  I have reviewed the triage vital signs and the nursing notes.  Pertinent labs & imaging results that were available during my care of the patient were reviewed by me and considered in my medical decision making (see chart for details).  Clinical Course as of Nov 29 2026  Wed Nov 29, 2018  1922 IMPRESSION: Subarachnoid hemorrhage at the craniocervical junction on the left. Small amount migration of blood dependent within the occipital horns of the lateral ventricles and in the fourth ventricle. No hydrocephalus.  There appears to be either an arteriovenous malformation or dural AV fistula in the region of the hemorrhage, probably supplied by the left PICA. This would be more accurately evaluated by catheter angiography.  50% stenosis of the proximal ICA on the left. 20% stenosis of the proximal ICA on  the right  Chronic mediastinal nodal prominence, similar to the PET scan 2018, at which time the vessels were not hypermetabolic.  CT Angio Neck W and/or Wo Contrast [CG]  1934 Spoke to Dr Zada Finders recommends SBP goal <160, consult PCCM for admission to [facility]    [CG]    Clinical Course User Index [CG]  Kinnie Feil, PA-C      ddx includes MSK. There is no preceding trauma and given neck stiffness on exam, age, concern for vascular neck etiology.  No focal neuro deficits. Doubt ischemic stroke. No radicular symptoms.  No distal pulse deficits.  Normotensive. Labs, CTs, migraine cocktail, reassess.   1928: CTA shows left SAH possibly from an AVM or dural AV fistula.  Re-evaluated patient and reports minimal improvement in HA. Discussed CT results. Pending NSGY consult. Nicardipine gtt started. Patient visit shared with EDMD.  Final Clinical Impressions(s) / ED Diagnoses   2032: Spoke to PCCM. Will admit. SBP goal < 160 per NSGY.   2105: Updated daughter Glenard Haring on POC.  Final diagnoses:  Subarachnoid hemorrhage [facility])    ED Discharge Orders    None         Arlean Hopping 11/29/18 2106    Deno Etienne, DO 11/29/18 2355

## 2018-11-29 NOTE — ED Triage Notes (Signed)
Pt in c/o headache and neck pain onset today upon wakening, reports sensitivity, pt denies n/v/d, pt denies SOB, A&O x4

## 2018-11-29 NOTE — Progress Notes (Signed)
Pt come up from ED with infiltrated IV. Site edematous and red. This RN attempted to place another IV before consulting IV team. Patient passed bedside swallow with this RN.

## 2018-11-29 NOTE — H&P (Signed)
NAME:  [name] [name], MRN:  [id number], DOB:  08-24-1934, LOS: 0 ADMISSION DATE:  11/29/2018, CONSULTATION DATE:  11/29/2018 REFERRING MD:  EDP, CHIEF COMPLAINT:  SAH, hypertensive emergency  Brief History   84 year old with history of COPD, lung cancer, diabetes, hypertension.  Admitted with sudden onset headache, neck pain.  Found to have a subarachnoid hemorrhage with hypertensive emergency. Neurosurgery consulted.  PCCM asked to admit.  Patient had been in her usual state of health yesterday.  She has chronic cough at baseline.  No fevers, dyspnea, recent travel or sick contacts.  Past Medical History  COPD, hypertension, diabetes, stage I squamous cell lung cancer status post left upper lobectomy in 2018  Significant Hospital Events     Consults:  Neurosurgery  Procedures:    Significant Diagnostic Tests:  CT angio head and neck 11/29/2018- subarachnoid hemorrhage.  Possible AV malformation or dural AV fistula in the region of hemorrhage.  Micro Data:    Antimicrobials:    Interim history/subjective:    Objective   Blood pressure (!) 185/80, pulse 80, temperature 98.5 F (36.9 C), temperature source Oral, resp. rate 19, height 5' 9"  (1.753 m), weight 81.2 kg, SpO2 100 %.       No intake or output data in the 24 hours ending 11/29/18 2123 Filed Weights   11/29/18 1429  Weight: 81.2 kg   Examination: Blood pressure (!) 185/80, pulse 80, temperature 98.5 F (36.9 C), temperature source Oral, resp. rate 19, height 5' 9"  (1.753 m), weight 81.2 kg, SpO2 100 %. Gen:      No acute distress, elderly HEENT:  EOMI, sclera anicteric Neck:     No masses; no thyromegaly Lungs:    Clear to auscultation bilaterally; normal respiratory effort CV:         Regular rate and rhythm; no murmurs Abd:      + bowel sounds; soft, non-tender; no palpable masses, no distension Ext:    No edema; adequate peripheral perfusion Skin:      Warm and dry; no rash Neuro: alert and oriented x  3  Resolved Hospital Problem list     Assessment & Plan:  84 year old with hypertension at baseline.  Admitted with subarachnoid hemorrhage, hypertensive emergency  Subarachnoid hemorrhage Admit to neuro ICU Neuro checks. Nimodipine per protocol Transcranial dropplers Neurosurgery consulted by ED.  Will await their recommendation  Hypertensive emergency Cardene drip.  Goal systolic blood pressure < 160  DM SSI coverage Holding home DM meds  COPD, lung cancer s/p resection Nebs PRN  Best practice:  Diet: NPO, swallow eval Pain/Anxiety/Delirium protocol (if indicated): NA VAP protocol (if indicated): NA DVT prophylaxis: SCDs GI prophylaxis: NA Glucose control: SSI Mobility: Bed Code Status: Full Family Communication: Daughter updated over telephone Disposition: 4N Neuro ICU  Labs   CBC: Recent Labs  Lab 11/29/18 1630  WBC 5.9  NEUTROABS 3.8  HGB 10.4*  HCT 32.4*  MCV 81.2  PLT 657    Basic Metabolic Panel: Recent Labs  Lab 11/29/18 1630  NA 140  K 4.4  CL 110  CO2 22  GLUCOSE 145*  BUN 15  CREATININE 1.01*  CALCIUM 8.9   GFR: Estimated Creatinine Clearance: 49.8 mL/min (A) (by C-G formula based on SCr of 1.01 mg/dL (H)). Recent Labs  Lab 11/29/18 1630  WBC 5.9    Liver Function Tests: No results for input(s): AST, ALT, ALKPHOS, BILITOT, PROT, ALBUMIN in the last 168 hours. No results for input(s): LIPASE, AMYLASE in the last 168  hours. No results for input(s): AMMONIA in the last 168 hours.  ABG    Component Value Date/Time   PHART 7.354 04/22/2017 0248   PCO2ART 35.9 04/22/2017 0248   PO2ART 65.0 (L) 04/22/2017 0248   HCO3 20.0 04/22/2017 0248   TCO2 21 (L) 04/22/2017 0248   ACIDBASEDEF 5.0 (H) 04/22/2017 0248   O2SAT 91.0 04/22/2017 0248     Coagulation Profile: No results for input(s): INR, PROTIME in the last 168 hours.  Cardiac Enzymes: No results for input(s): CKTOTAL, CKMB, CKMBINDEX, TROPONINI in the last 168 hours.   HbA1C: Hgb A1c MFr Bld  Date/Time Value Ref Range Status  04/19/2017 11:43 AM 6.1 (H) 4.8 - 5.6 % Final    Comment:    (NOTE) Pre diabetes:          5.7%-6.4% Diabetes:              >6.4% Glycemic control for   <7.0% adults with diabetes   12/31/2016 05:52 AM 6.1 (H) 4.8 - 5.6 % Final    Comment:    (NOTE)         Pre-diabetes: 5.7 - 6.4         Diabetes: >6.4         Glycemic control for adults with diabetes: <7.0     CBG: No results for input(s): GLUCAP in the last 168 hours.  Review of Systems:   All negative; except for those that are bolded, which indicate positives.  Constitutional: weight loss, weight gain, night sweats, fevers, chills, fatigue, weakness.  HEENT: headaches, sore throat, sneezing, nasal congestion, post nasal drip, difficulty swallowing, tooth/dental problems, visual complaints, visual changes, ear aches. Neuro: difficulty with speech, weakness, numbness, ataxia. CV:  chest pain, orthopnea, PND, swelling in lower extremities, dizziness, palpitations, syncope.  Resp: cough, hemoptysis, dyspnea, wheezing. GI: heartburn, indigestion, abdominal pain, nausea, vomiting, diarrhea, constipation, change in bowel habits, loss of appetite, hematemesis, melena, hematochezia.  GU: dysuria, change in color of urine, urgency or frequency, flank pain, hematuria. MSK: joint pain or swelling, decreased range of motion. Psych: change in mood or affect, depression, anxiety, suicidal ideations, homicidal ideations. Skin: rash, itching, bruising.  Past Medical History  She,  has a past medical history of Anginal pain ([place]), Arthritis, Asthma, Coronary artery disease, Diabetes mellitus, Dyspnea, Early cataracts, bilateral, GERD (gastroesophageal reflux disease), Hypertension, Lung cancer ([place]), Mass of upper lobe of left lung, Palpitations, Pneumonia, Stroke ([place]), Wears dentures, and Wears glasses.   Surgical History    Past Surgical History:  Procedure Laterality Date   . ABDOMINAL HYSTERECTOMY    . ANKLE SURGERY Left    fusion  . COLONOSCOPY W/ BIOPSIES AND POLYPECTOMY    . MULTIPLE TOOTH EXTRACTIONS    . TOTAL KNEE ARTHROPLASTY Right   . VIDEO ASSISTED THORACOSCOPY (VATS)/ LOBECTOMY Left 04/21/2017   Procedure: VIDEO ASSISTED THORACOSCOPY (VATS)/LEFT UPPER LOBECTOMY;  Surgeon: Melrose Nakayama, MD;  Location: [facility];  Service: Thoracic;  Laterality: Left;  Marland Kitchen VIDEO BRONCHOSCOPY WITH ENDOBRONCHIAL NAVIGATION N/A 01/21/2017   Procedure: VIDEO BRONCHOSCOPY WITH ENDOBRONCHIAL NAVIGATION;  Surgeon: Melrose Nakayama, MD;  Location: [facility];  Service: Thoracic;  Laterality: N/A;  . VIDEO BRONCHOSCOPY WITH ENDOBRONCHIAL ULTRASOUND N/A 01/21/2017   Procedure: VIDEO BRONCHOSCOPY WITH ENDOBRONCHIAL ULTRASOUND;  Surgeon: Melrose Nakayama, MD;  Location: [facility];  Service: Thoracic;  Laterality: N/A;     Social History   reports that she quit smoking about 20 months ago. Her smoking use included cigarettes. She has a  3.00 pack-year smoking history. She has never used smokeless tobacco. She reports that she does not drink alcohol or use drugs.   Family History   Her family history includes Diabetes in her brother, mother, sister, and son; Hyperlipidemia in her sister. There is no history of Cancer.   Allergies Allergies  Allergen Reactions  . Penicillins Rash and Other (See Comments)    PATIENT HAS HAD A PCN REACTION WITH IMMEDIATE RASH, FACIAL/TONGUE/THROAT SWELLING, SOB, OR LIGHTHEADEDNESS WITH HYPOTENSION:  #  #  #  YES  #  #  #  Has patient had a PCN reaction causing severe rash involving mucus membranes or skin necrosis: NO Has patient had a PCN reaction that required hospitalization NO Has patient had a PCN reaction occurring within the last 10 years: NO  . Codeine Rash     Home Medications  Prior to Admission medications   Medication Sig Start Date End Date Taking? Authorizing Provider  amLODipine (NORVASC) 10 MG tablet Take 10 mg by mouth daily.       [provider]  AQUALANCE LANCETS 30G [place]  11/02/17   [provider]  aspirin EC 81 MG tablet Take 81 mg by mouth daily.      [provider]  atorvastatin (LIPITOR) 20 MG tablet  11/16/17   [provider]  Blood Glucose Calibration (OT ULTRA/FASTTK CNTRL SOLN) SOLN  11/02/17   [provider]  Blood Glucose Monitoring Suppl (ONE TOUCH ULTRA 2) w/Device KIT  11/02/17   [provider]  Cholecalciferol (VITAMIN D3 PO) Take 1 tablet by mouth daily.    [provider]  ergocalciferol (VITAMIN D2) 50000 units capsule ergocalciferol (vitamin D2) 50,000 unit capsule    [provider]  guaifenesin (ROBITUSSIN) 100 MG/5ML syrup Take 200 mg by mouth 3 (three) times daily as needed for cough.    [provider]  hydrALAZINE (APRESOLINE) 25 MG tablet Take 25 mg by mouth 2 (two) times daily.     [provider]  isosorbide dinitrate (ISORDIL) 20 MG tablet Take 20 mg by mouth 2 (two) times daily.     [provider]  Lancet Devices (ADJUSTABLE LANCING DEVICE) MISC  11/02/17   [provider]  linagliptin (TRADJENTA) 5 MG TABS tablet Tradjenta 5 mg tablet    [provider]  losartan (COZAAR) 50 MG tablet Take 50 mg by mouth daily.    [provider]  meclizine (ANTIVERT) 25 MG tablet Take 25 mg by mouth 2 (two) times daily as needed for dizziness.    [provider]  metFORMIN (GLUCOPHAGE) 500 MG tablet Take 500 mg by mouth 2 (two) times daily.     [provider]  metoprolol succinate (TOPROL-XL) 25 MG 24 hr tablet Take 25 mg by mouth every evening.     [provider]  montelukast (SINGULAIR) 10 MG tablet montelukast 10 mg tablet    [provider]  nitroGLYCERIN (NITROSTAT) 0.4 MG SL tablet Place 0.4 mg under the tongue every 5 (five) minutes as needed for chest pain.    [provider]  OMEPRAZOLE PO Take by mouth every other day.  ALTERNATE WITH TUMS    [provider]  ONE TOUCH ULTRA TEST test strip  11/02/17   [provider]  pioglitazone (ACTOS) 30 MG tablet Take 30 mg by mouth every evening.     [provider]  traMADol (ULTRAM) 50 MG tablet tramadol 50 mg tablet    [provider]  valsartan (DIOVAN) 160 MG tablet TK 1 T PO QD 11/29/17   [provider]    The patient is critically ill with multiple organ system failure and requires high complexity decision making for assessment and support, frequent evaluation and titration of therapies, advanced monitoring, review of radiographic studies and interpretation of complex data.   Critical Care Time devoted to patient care services, exclusive of separately billable procedures, described in this note is 35 minutes.   Marshell Garfinkel MD [facility] Pager [phone] If no answer call [phone] 11/29/2018, 9:36 PM

## 2018-11-29 NOTE — ED Notes (Signed)
Glenard Haring [phone] pts daughter please update

## 2018-11-29 NOTE — ED Notes (Signed)
ED TO INPATIENT HANDOFF REPORT  ED Nurse Name and Phone #:   [phone]  S Name/Age/Gender [name] [name] 84 y.o. female Room/Bed: 020C/020C  Code Status   Code Status: Full Code  Home/SNF/Other Home Patient oriented to: self, place, time and situation Is this baseline? Yes   Triage Complete: Triage complete  Chief Complaint Migraines; HA  Triage Note Pt in c/o headache and neck pain onset today upon wakening, reports sensitivity, pt denies n/v/d, pt denies SOB, A&O x4   Allergies Allergies  Allergen Reactions  . Penicillins Rash and Other (See Comments)    PATIENT HAS HAD A PCN REACTION WITH IMMEDIATE RASH, FACIAL/TONGUE/THROAT SWELLING, SOB, OR LIGHTHEADEDNESS WITH HYPOTENSION:  #  #  #  YES  #  #  #  Has patient had a PCN reaction causing severe rash involving mucus membranes or skin necrosis: NO Has patient had a PCN reaction that required hospitalization NO Has patient had a PCN reaction occurring within the last 10 years: NO  . Codeine Rash    Level of Care/Admitting Diagnosis ED Disposition    ED Disposition Condition [place]: [facility] [[id number]]  Level of Care: ICU [6]  Covid Evaluation: N/A  Diagnosis: SAH (subarachnoid hemorrhage) ([place]) [[id number]]  Admitting Physician: Marshell Garfinkel [[id number]]  Attending Physician: Marshell Garfinkel [[id number]]  Estimated length of stay: 3 - 4 days  Certification:: I certify this patient will need inpatient services for at least 2 midnights  Bed request comments: 4N  PT Class (Do Not Modify): Inpatient [101]  PT Acc Code (Do Not Modify): Private [1]       B Medical/Surgery History Past Medical History:  Diagnosis Date  . Anginal pain ([place])   . Arthritis   . Asthma   . Coronary artery disease   . Diabetes mellitus   . Dyspnea   . Early cataracts, bilateral   . GERD (gastroesophageal reflux disease)    occ  . Hypertension   . Lung cancer ([place])   . Mass of upper lobe  of left lung    mediastinal adenopathy  . Palpitations   . Pneumonia   . Stroke [facility])    mini stroke  . Wears dentures   . Wears glasses    Past Surgical History:  Procedure Laterality Date  . ABDOMINAL HYSTERECTOMY    . ANKLE SURGERY Left    fusion  . COLONOSCOPY W/ BIOPSIES AND POLYPECTOMY    . MULTIPLE TOOTH EXTRACTIONS    . TOTAL KNEE ARTHROPLASTY Right   . VIDEO ASSISTED THORACOSCOPY (VATS)/ LOBECTOMY Left 04/21/2017   Procedure: VIDEO ASSISTED THORACOSCOPY (VATS)/LEFT UPPER LOBECTOMY;  Surgeon: Melrose Nakayama, MD;  Location: [place];  Service: Thoracic;  Laterality: Left;  Marland Kitchen VIDEO BRONCHOSCOPY WITH ENDOBRONCHIAL NAVIGATION N/A 01/21/2017   Procedure: VIDEO BRONCHOSCOPY WITH ENDOBRONCHIAL NAVIGATION;  Surgeon: Melrose Nakayama, MD;  Location: [place];  Service: Thoracic;  Laterality: N/A;  . VIDEO BRONCHOSCOPY WITH ENDOBRONCHIAL ULTRASOUND N/A 01/21/2017   Procedure: VIDEO BRONCHOSCOPY WITH ENDOBRONCHIAL ULTRASOUND;  Surgeon: Melrose Nakayama, MD;  Location: [facility];  Service: Thoracic;  Laterality: N/A;     A IV Location/Drains/Wounds Patient Lines/Drains/Airways Status   Active Line/Drains/Airways    Name:   Placement date:   Placement time:   Site:   Days:   Peripheral IV 11/29/18 Right Arm   11/29/18    1659    Arm   less than 1  Intake/Output Last 24 hours No intake or output data in the 24 hours ending 11/29/18 2146  Labs/Imaging Results for orders placed or performed during the hospital encounter of 11/29/18 (from the past 48 hour(s))  CBC with Differential     Status: Abnormal   Collection Time: 11/29/18  4:30 PM  Result Value Ref Range   WBC 5.9 4.0 - 10.5 K/uL   RBC 3.99 3.87 - 5.11 MIL/uL   Hemoglobin 10.4 (L) 12.0 - 15.0 g/dL   HCT 32.4 (L) 36.0 - 46.0 %   MCV 81.2 80.0 - 100.0 fL   MCH 26.1 26.0 - 34.0 pg   MCHC 32.1 30.0 - 36.0 g/dL   RDW 15.4 11.5 - 15.5 %   Platelets 217 150 - 400 K/uL    Comment: REPEATED TO VERIFY SPECIMEN  CHECKED FOR CLOTS    nRBC 0.0 0.0 - 0.2 %   Neutrophils Relative % 66 %   Neutro Abs 3.8 1.7 - 7.7 K/uL   Lymphocytes Relative 25 %   Lymphs Abs 1.5 0.7 - 4.0 K/uL   Monocytes Relative 7 %   Monocytes Absolute 0.4 0.1 - 1.0 K/uL   Eosinophils Relative 2 %   Eosinophils Absolute 0.1 0.0 - 0.5 K/uL   Basophils Relative 0 %   Basophils Absolute 0.0 0.0 - 0.1 K/uL   Immature Granulocytes 0 %   Abs Immature Granulocytes 0.02 0.00 - 0.07 K/uL    Comment: Performed at [facility], [address], [place], [place] [address]  Basic metabolic panel     Status: Abnormal   Collection Time: 11/29/18  4:30 PM  Result Value Ref Range   Sodium 140 135 - 145 mmol/L   Potassium 4.4 3.5 - 5.1 mmol/L   Chloride 110 98 - 111 mmol/L   CO2 22 22 - 32 mmol/L   Glucose, Bld 145 (H) 70 - 99 mg/dL   BUN 15 8 - 23 mg/dL   Creatinine, Ser 1.01 (H) 0.44 - 1.00 mg/dL   Calcium 8.9 8.9 - 10.3 mg/dL   GFR calc non Af Amer 52 (L) >60 mL/min   GFR calc Af Amer >60 >60 mL/min   Anion gap 8 5 - 15    Comment: Performed at [facility], [place] [address], [place],  [address]   Ct Angio Head W Or Wo Contrast  Result Date: 11/29/2018 CLINICAL DATA:  Headache and neck pain beginning today. History of lung cancer. EXAM: CT ANGIOGRAPHY HEAD AND NECK TECHNIQUE: Multidetector CT imaging of the head and neck was performed using the standard protocol during bolus administration of intravenous contrast. Multiplanar CT image reconstructions and MIPs were obtained to evaluate the vascular anatomy. Carotid stenosis measurements (when applicable) are obtained utilizing NASCET criteria, using the distal internal carotid diameter as the denominator. CONTRAST:  168mL OMNIPAQUE IOHEXOL 350 MG/ML SOLN COMPARISON:  CT 10/15/2015 FINDINGS: CT HEAD FINDINGS Brain: There is localized subarachnoid hemorrhage around the left side of the cervicomedullary junction. Tiny amount of dependent blood noted in the occipital horns of  lateral ventricles and in the fourth ventricle. Evidence of intraparenchymal hemorrhage. I did consider the possibility of there could be some blood in the region of the pineal gland but think that is less likely. Brain parenchyma does not show accelerated atrophy for age. There are mild chronic small-vessel ischemic changes of the white matter. Vascular: There is atherosclerotic calcification of the major vessels at the base of the brain. Skull: Negative Sinuses: Mucosal thickening  of the maxillary sinuses. Otherwise negative. Orbits: Normal Review of the MIP images confirms the above findings CTA NECK FINDINGS Aortic arch: Ordinary atherosclerosis. No evidence of aneurysm or dissection. Branching pattern is normal. Right carotid system: Common carotid artery widely patent to the bifurcation. Calcified plaque at the carotid bifurcation and ICA bulb. Proximal ICA stenosis 20%. Cervical ICA widely patent beyond that. Left carotid system: Common carotid artery widely patent to the bifurcation. Calcified plaque at the carotid bifurcation and ICA bulb. Minimal diameter of the proximal ICA is 2.5 cm. This indicates a 50% stenosis. Cervical ICA widely patent beyond that. Both internal carotid arteries are tortuous. Vertebral arteries: Both vertebral artery origins are widely patent. Both vertebral arteries appear widely patent through the cervical region to the foramen magnum. Skeleton: Ordinary cervical spondylosis. Other neck: No soft tissue neck mass or lymphadenopathy. Upper chest: Chronic prominent paratracheal lymph nodes, similar to the previous PET scan June 2018, at which time these were not hypermetabolic. There is extensive/advanced emphysema in the upper lobes, right worse than left. Review of the MIP images confirms the above findings CTA HEAD FINDINGS Anterior circulation: Both internal carotid arteries are widely patent through the skull base and siphon regions. There is siphon atherosclerotic calcification  but no stenosis greater than 20%. Anterior and middle cerebral vessels are patent without proximal stenosis, aneurysm or vascular malformation Posterior circulation: Both vertebral arteries are patent at the foramen magnum. In the region of the subarachnoid hemorrhage on the left at the cervicomedullary junction, there are some abnormal vessels that I believe arise from left PICA. This could either be an arteriovenous malformation or a dural AV fistula. This is likely the cause/source of the hemorrhage. Beyond that, the basilar artery is widely patent. Posterior circulation branch vessels appear otherwise normal. Venous sinuses: Patent and normal. Anatomic variants: None significant. Delayed phase: No abnormal enhancement. Review of the MIP images confirms the above findings IMPRESSION: Subarachnoid hemorrhage at the craniocervical junction on the left. Small amount migration of blood dependent within the occipital horns of the lateral ventricles and in the fourth ventricle. No hydrocephalus. There appears to be either an arteriovenous malformation or dural AV fistula in the region of the hemorrhage, probably supplied by the left PICA. This would be more accurately evaluated by catheter angiography. 50% stenosis of the proximal ICA on the left. 20% stenosis of the proximal ICA on the right Chronic mediastinal nodal prominence, similar to the PET scan 2018, at which time the vessels were not hypermetabolic. Critical Value/emergent results were called by telephone at the time of interpretation on 11/29/2018 at 6:53 Pm to Dr. Carmon Sails , who verbally acknowledged these results. Electronically Signed   By: Nelson Chimes M.D.   On: 11/29/2018 19:01   Ct Angio Neck W And/or Wo Contrast  Result Date: 11/29/2018 CLINICAL DATA:  Headache and neck pain beginning today. History of lung cancer. EXAM: CT ANGIOGRAPHY HEAD AND NECK TECHNIQUE: Multidetector CT imaging of the head and neck was performed using the standard  protocol during bolus administration of intravenous contrast. Multiplanar CT image reconstructions and MIPs were obtained to evaluate the vascular anatomy. Carotid stenosis measurements (when applicable) are obtained utilizing NASCET criteria, using the distal internal carotid diameter as the denominator. CONTRAST:  173mL OMNIPAQUE IOHEXOL 350 MG/ML SOLN COMPARISON:  CT 10/15/2015 FINDINGS: CT HEAD FINDINGS Brain: There is localized subarachnoid hemorrhage around the left side of the cervicomedullary junction. Tiny amount of dependent blood noted in the occipital horns of lateral ventricles and  in the fourth ventricle. Evidence of intraparenchymal hemorrhage. I did consider the possibility of there could be some blood in the region of the pineal gland but think that is less likely. Brain parenchyma does not show accelerated atrophy for age. There are mild chronic small-vessel ischemic changes of the white matter. Vascular: There is atherosclerotic calcification of the major vessels at the base of the brain. Skull: Negative Sinuses: Mucosal thickening of the maxillary sinuses. Otherwise negative. Orbits: Normal Review of the MIP images confirms the above findings CTA NECK FINDINGS Aortic arch: Ordinary atherosclerosis. No evidence of aneurysm or dissection. Branching pattern is normal. Right carotid system: Common carotid artery widely patent to the bifurcation. Calcified plaque at the carotid bifurcation and ICA bulb. Proximal ICA stenosis 20%. Cervical ICA widely patent beyond that. Left carotid system: Common carotid artery widely patent to the bifurcation. Calcified plaque at the carotid bifurcation and ICA bulb. Minimal diameter of the proximal ICA is 2.5 cm. This indicates a 50% stenosis. Cervical ICA widely patent beyond that. Both internal carotid arteries are tortuous. Vertebral arteries: Both vertebral artery origins are widely patent. Both vertebral arteries appear widely patent through the cervical region  to the foramen magnum. Skeleton: Ordinary cervical spondylosis. Other neck: No soft tissue neck mass or lymphadenopathy. Upper chest: Chronic prominent paratracheal lymph nodes, similar to the previous PET scan June 2018, at which time these were not hypermetabolic. There is extensive/advanced emphysema in the upper lobes, right worse than left. Review of the MIP images confirms the above findings CTA HEAD FINDINGS Anterior circulation: Both internal carotid arteries are widely patent through the skull base and siphon regions. There is siphon atherosclerotic calcification but no stenosis greater than 20%. Anterior and middle cerebral vessels are patent without proximal stenosis, aneurysm or vascular malformation Posterior circulation: Both vertebral arteries are patent at the foramen magnum. In the region of the subarachnoid hemorrhage on the left at the cervicomedullary junction, there are some abnormal vessels that I believe arise from left PICA. This could either be an arteriovenous malformation or a dural AV fistula. This is likely the cause/source of the hemorrhage. Beyond that, the basilar artery is widely patent. Posterior circulation branch vessels appear otherwise normal. Venous sinuses: Patent and normal. Anatomic variants: None significant. Delayed phase: No abnormal enhancement. Review of the MIP images confirms the above findings IMPRESSION: Subarachnoid hemorrhage at the craniocervical junction on the left. Small amount migration of blood dependent within the occipital horns of the lateral ventricles and in the fourth ventricle. No hydrocephalus. There appears to be either an arteriovenous malformation or dural AV fistula in the region of the hemorrhage, probably supplied by the left PICA. This would be more accurately evaluated by catheter angiography. 50% stenosis of the proximal ICA on the left. 20% stenosis of the proximal ICA on the right Chronic mediastinal nodal prominence, similar to the PET  scan 2018, at which time the vessels were not hypermetabolic. Critical Value/emergent results were called by telephone at the time of interpretation on 11/29/2018 at 6:53 Pm to Dr. Carmon Sails , who verbally acknowledged these results. Electronically Signed   By: Nelson Chimes M.D.   On: 11/29/2018 19:01    Pending Labs Unresulted Labs (From admission, onward)    Start     Ordered   11/30/18 0500  Renal function panel  Tomorrow morning,   R     11/29/18 2129   11/30/18 0500  Magnesium  Tomorrow morning,   STAT     11/29/18 2129  Vitals/Pain Today's Vitals   11/29/18 1429 11/29/18 1839 11/29/18 1900 11/29/18 1930  BP: (!) 156/61 (!) 159/91 (!) 179/84 (!) 185/80  Pulse: 80     Resp: 14 (!) 21 (!) 26 19  Temp: 98.5 F (36.9 C)     TempSrc: Oral     SpO2: 100%     Weight: 179 lb (81.2 kg)     Height: 5\' 9"  (1.753 m)     PainSc: 10-Worst pain ever       Isolation Precautions No active isolations  Medications Medications  nicardipine (CARDENE) 20mg  in 0.86% saline 22ml IV infusion (0.1 mg/ml) (10 mg/hr Intravenous Rate/Dose Change 11/29/18 2141)   stroke: mapping our early stages of recovery book (has no administration in time range)  niMODipine (NIMOTOP) capsule 60 mg (has no administration in time range)    Or  niMODipine (NYMALIZE) 60 MG/20ML oral solution 60 mg (has no administration in time range)  amLODipine (NORVASC) tablet 10 mg (has no administration in time range)  atorvastatin (LIPITOR) tablet 20 mg (has no administration in time range)  hydrALAZINE (APRESOLINE) tablet 25 mg (has no administration in time range)  isosorbide dinitrate (ISORDIL) tablet 20 mg (has no administration in time range)  losartan (COZAAR) tablet 50 mg (has no administration in time range)  montelukast (SINGULAIR) tablet 10 mg (has no administration in time range)  metoprolol tartrate (LOPRESSOR) tablet 12.5 mg (has no administration in time range)  insulin aspart (novoLOG)  injection 0-9 Units (has no administration in time range)  metoCLOPramide (REGLAN) injection 10 mg (10 mg Intravenous Given 11/29/18 1720)  methocarbamol (ROBAXIN) 1,000 mg in dextrose 5 % 50 mL IVPB (0 mg Intravenous Stopped 11/29/18 1917)  iohexol (OMNIPAQUE) 350 MG/ML injection 100 mL (100 mLs Intravenous Contrast Given 11/29/18 1826)    Mobility walks with person assist High fall risk   Focused Assessments Neuro Assessment Handoff:  Swallow screen pass? Yes    NIH Stroke Scale ( + Modified Stroke Scale Criteria)  Level of Consciousness (1a.)   : Alert, keenly responsive LOC Questions (1b. )   +: Answers both questions correctly LOC Commands (1c. )   + : Performs both tasks correctly Best Gaze (2. )  +: Normal Visual (3. )  +: No visual loss Facial Palsy (4. )    : Normal symmetrical movements Motor Arm, Left (5a. )   +: No drift Motor Arm, Right (5b. )   +: No drift Motor Leg, Left (6a. )   +: No drift Motor Leg, Right (6b. )   +: No drift Limb Ataxia (7. ): Present in one limb Sensory (8. )   +: Normal, no sensory loss Best Language (9. )   +: No aphasia Dysarthria (10. ): Normal Extinction/Inattention (11.)   +: No Abnormality Modified SS Total  +: 0 Complete NIHSS TOTAL: 1     Neuro Assessment: Exceptions to WDL Neuro Checks:      Last Documented NIHSS Modified Score: 0 (11/29/18 1903) Has TPA been given? No If patient is a Neuro Trauma and patient is going to OR before floor call report to [place] nurse: [phone] or [phone]     R Recommendations: See Admitting Provider Note  Report given to:   Additional Notes:

## 2018-11-29 NOTE — ED Notes (Signed)
Patient returned from [place]. Nurse navigator spoke with patient. Attempted to call Elberta Fortis (Son) [phone] no answer. Called [facility] spoke with Glenard Haring daughter in law of patient. Patient spoke with [facility] regarding plan of care. Glenard Haring will notify the rest of family members.

## 2018-11-29 NOTE — ED Notes (Signed)
Patient transported to CT 

## 2018-11-29 NOTE — ED Notes (Signed)
repaged critical to PA Donney Rankins

## 2018-11-30 ENCOUNTER — Other Ambulatory Visit (HOSPITAL_COMMUNITY): Payer: Medicare HMO

## 2018-11-30 ENCOUNTER — Inpatient Hospital Stay (HOSPITAL_COMMUNITY): Payer: Medicare HMO

## 2018-11-30 HISTORY — PX: IR ANGIOGRAM SELECTIVE EACH ADDITIONAL VESSEL: IMG667

## 2018-11-30 HISTORY — PX: IR ANGIO VERTEBRAL SEL VERTEBRAL BILAT MOD SED: IMG5369

## 2018-11-30 HISTORY — PX: IR ANGIO EXTRACRAN SEL COM CAROTID INNOMINATE UNI BILAT MOD SED: IMG5357

## 2018-11-30 LAB — GLUCOSE, CAPILLARY
Glucose-Capillary: 131 mg/dL — ABNORMAL HIGH (ref 70–99)
Glucose-Capillary: 123 mg/dL — ABNORMAL HIGH (ref 70–99)
Glucose-Capillary: 92 mg/dL (ref 70–99)
Glucose-Capillary: 115 mg/dL — ABNORMAL HIGH (ref 70–99)
Glucose-Capillary: 162 mg/dL — ABNORMAL HIGH (ref 70–99)
Glucose-Capillary: 139 mg/dL — ABNORMAL HIGH (ref 70–99)

## 2018-11-30 LAB — RENAL FUNCTION PANEL
Albumin: 3.7 g/dL (ref 3.5–5.0)
Anion gap: 10 (ref 5–15)
BUN: 13 mg/dL (ref 8–23)
CO2: 20 mmol/L — ABNORMAL LOW (ref 22–32)
Calcium: 9.2 mg/dL (ref 8.9–10.3)
Chloride: 109 mmol/L (ref 98–111)
Creatinine, Ser: 1 mg/dL (ref 0.44–1.00)
GFR calc Af Amer: >60 mL/min (ref >60)
GFR calc non Af Amer: 53 mL/min — ABNORMAL LOW (ref >60)
Glucose, Bld: 129 mg/dL — ABNORMAL HIGH (ref 70–99)
Phosphorus: 3.5 mg/dL (ref 2.5–4.6)
Potassium: 3.7 mmol/L (ref 3.5–5.1)
Sodium: 139 mmol/L (ref 135–145)

## 2018-11-30 LAB — MAGNESIUM: Magnesium: 1.8 mg/dL (ref 1.7–2.4)

## 2018-11-30 LAB — MRSA PCR SCREENING: MRSA by PCR: NEGATIVE

## 2018-11-30 MED ORDER — MIDAZOLAM HCL 2 MG/2ML IJ SOLN
INTRAMUSCULAR | Status: AC | PRN
  Administered 2018-11-30: 1 mg via INTRAVENOUS

## 2018-11-30 MED ORDER — MIDAZOLAM HCL 2 MG/2ML IJ SOLN
INTRAMUSCULAR | Status: AC
  Filled 2018-11-30: qty 2

## 2018-11-30 MED ORDER — LIDOCAINE HCL 1 % IJ SOLN
INTRAMUSCULAR | Status: AC
  Filled 2018-11-30: qty 20

## 2018-11-30 MED ORDER — FENTANYL CITRATE (PF) 100 MCG/2ML IJ SOLN
INTRAMUSCULAR | Status: AC
  Filled 2018-11-30: qty 2

## 2018-11-30 MED ORDER — FENTANYL CITRATE (PF) 100 MCG/2ML IJ SOLN
INTRAMUSCULAR | Status: AC | PRN
  Administered 2018-11-30: 25 ug via INTRAVENOUS

## 2018-11-30 MED ORDER — IOHEXOL 300 MG/ML  SOLN
100.0000 mL | Freq: Once | INTRAMUSCULAR | Status: AC | PRN
  Administered 2018-11-30: 18:00:00 50 mL via INTRA_ARTERIAL

## 2018-11-30 MED ORDER — ACETAMINOPHEN 10 MG/ML IV SOLN
1000.0000 mg | Freq: Four times a day (QID) | INTRAVENOUS | Status: AC | PRN
  Administered 2018-11-30 (×2): 1000 mg via INTRAVENOUS
  Filled 2018-11-30 (×2): qty 100

## 2018-11-30 MED ORDER — HEPARIN SODIUM (PORCINE) 1000 UNIT/ML IJ SOLN
INTRAMUSCULAR | Status: AC
  Filled 2018-11-30: qty 1

## 2018-11-30 NOTE — Brief Op Note (Signed)
  NEUROSURGERY BRIEF OPERATIVE NOTE   PREOP DX: Subarachnoid Hemorrhage  POSTOP DX: Same  PROCEDURE: Diagnostic cerebral angiogram  SURGEON: Dr. Consuella Lose, MD  ANESTHESIA: IV Sedation with Local  EBL: Minimal  SPECIMENS: None  COMPLICATIONS: None  CONDITION: Stable to recovery  FINDINGS (Full report in [place]): 1. Normal angiogram, no aneurysm, AVM, or fistula identified.

## 2018-11-30 NOTE — Sedation Documentation (Signed)
Vital signs stable. Pt tolerating procedure well. No complaints at this time.

## 2018-11-30 NOTE — Progress Notes (Signed)
NAME:  [name] [name], MRN:  [id number], DOB:  Jan 03, 1935, LOS: 1 ADMISSION DATE:  11/29/2018, CONSULTATION DATE:  11/29/2018 REFERRING MD:  EDP, CHIEF COMPLAINT:  SAH, hypertensive emergency  Brief History   84 year old with history of COPD, lung cancer, diabetes, hypertension.  Admitted with sudden onset headache, neck pain.  Found to have a subarachnoid hemorrhage with hypertensive emergency. Neurosurgery consulted.  PCCM asked to admit.  Patient had been in her usual state of health yesterday.  She has chronic cough at baseline.  No fevers, dyspnea, recent travel or sick contacts.  Past Medical History  COPD, hypertension, diabetes, stage I squamous cell lung cancer status post left upper lobectomy in 2018  Significant Hospital Events   None to date  Consults:  Neurosurgery  Procedures:  None to date  Significant Diagnostic Tests:  CT angio head and neck 11/29/2018- subarachnoid hemorrhage.  Possible AV malformation or dural AV fistula in the region of hemorrhage.  Micro Data:   n/a Antimicrobials:   n/a Interim history/subjective:   No acute events o/n. Pt reports persistent neck stiffness, that can improve with repositioning. Headache still present but modestly better than yesterday. Pt denies changes in smell, vision, sensation, memory, or difficulty swallowing.  Objective   Blood pressure (!) 131/48, pulse (!) 58, temperature 98.9 F (37.2 C), temperature source Oral, resp. rate (!) 25, height 5' 9"  (1.753 m), weight 79.8 kg, SpO2 (!) 86 %.        Intake/Output Summary (Last 24 hours) at 11/30/2018 0858 Last data filed at 11/30/2018 0800 Gross per 24 hour  Intake 906.89 ml  Output 400 ml  Net 506.89 ml   Filed Weights   11/29/18 1429 11/30/18 0000 11/30/18 0103  Weight: 81.2 kg 79.8 kg 79.8 kg   Examination: Blood pressure (!) 185/80, pulse 80, temperature 98.5 F (36.9 C), temperature source Oral, resp. rate 19, height 5' 9"  (1.753 m), weight 81.2 kg, SpO2  100 %. Gen:      No acute distress, elderly HEENT:  AT/; EOMI, PERRLA sclera anicteric Neck:     No masses; no thyromegaly Lungs:    Clear to auscultation bilaterally; normal respiratory effort CV:         Regular rate and rhythm; no murmurs Abd:      + bowel sounds; soft, non-tender; no palpable masses, no distension Ext:    No edema; adequate peripheral perfusion Skin:      Warm and dry; no rash Neuro: alert and oriented x 3; CN III-XII intact to confrontation, MAE on request  Resolved Hospital Problem list     Assessment & Plan:  84 year old with hypertension at baseline.  Admitted with subarachnoid hemorrhage, hypertensive emergency  Subarachnoid hemorrhage Admit to neuro ICU Neuro checks. Nimodipine per protocol Transcranial dropplers Neurosurgery consulted by ED.  Dr. Zada Finders recc's angio with Dr. Kathyrn Sheriff for ? Of AVM, subsequent recc's  Hypertensive emergency Continue Cardene drip.  Goal systolic blood pressure < 160  DM SSI coverage Holding home DM meds  COPD, lung cancer s/p resection Nebs PRN  Best practice:  Diet: NPO, swallow eval Pain/Anxiety/Delirium protocol (if indicated): NA VAP protocol (if indicated): NA DVT prophylaxis: SCDs GI prophylaxis: NA Glucose control: SSI Mobility: Bed Code Status: Full Family Communication: Daughter updated over telephone yesterday; will await studies and updated reccs Disposition: 4N Neuro ICU  Labs   CBC: Recent Labs  Lab 11/29/18 1630  WBC 5.9  NEUTROABS 3.8  HGB 10.4*  HCT 32.4*  MCV 81.2  PLT  361    Basic Metabolic Panel: Recent Labs  Lab 11/29/18 1630 11/30/18 0429  NA 140 139  K 4.4 3.7  CL 110 109  CO2 22 20*  GLUCOSE 145* 129*  BUN 15 13  CREATININE 1.01* 1.00  CALCIUM 8.9 9.2  MG  --  1.8  PHOS  --  3.5   GFR: Estimated Creatinine Clearance: 49.9 mL/min (by C-G formula based on SCr of 1 mg/dL). Recent Labs  Lab 11/29/18 1630  WBC 5.9    Liver Function Tests: Recent Labs   Lab 11/30/18 0429  ALBUMIN 3.7   No results for input(s): LIPASE, AMYLASE in the last 168 hours. No results for input(s): AMMONIA in the last 168 hours.  ABG    Component Value Date/Time   PHART 7.354 04/22/2017 0248   PCO2ART 35.9 04/22/2017 0248   PO2ART 65.0 (L) 04/22/2017 0248   HCO3 20.0 04/22/2017 0248   TCO2 21 (L) 04/22/2017 0248   ACIDBASEDEF 5.0 (H) 04/22/2017 0248   O2SAT 91.0 04/22/2017 0248     Coagulation Profile: No results for input(s): INR, PROTIME in the last 168 hours.  Cardiac Enzymes: No results for input(s): CKTOTAL, CKMB, CKMBINDEX, TROPONINI in the last 168 hours.  HbA1C: Hgb A1c MFr Bld  Date/Time Value Ref Range Status  04/19/2017 11:43 AM 6.1 (H) 4.8 - 5.6 % Final    Comment:    (NOTE) Pre diabetes:          5.7%-6.4% Diabetes:              >6.4% Glycemic control for   <7.0% adults with diabetes   12/31/2016 05:52 AM 6.1 (H) 4.8 - 5.6 % Final    Comment:    (NOTE)         Pre-diabetes: 5.7 - 6.4         Diabetes: >6.4         Glycemic control for adults with diabetes: <7.0     CBG: Recent Labs  Lab 11/29/18 2340 11/30/18 0347 11/30/18 0812  GLUCAP 138* 131* 123*    Review of Systems:   All negative; except for those that are bolded, which indicate positives.  Constitutional: weight loss, weight gain, night sweats, fevers, chills, fatigue, weakness.  HEENT: headaches, sore throat, sneezing, nasal congestion, post nasal drip, difficulty swallowing, tooth/dental problems, visual complaints, visual changes, ear aches. Neuro: difficulty with speech, weakness, numbness, ataxia. CV:  chest pain, orthopnea, PND, swelling in lower extremities, dizziness, palpitations, syncope.  Resp: cough, hemoptysis, dyspnea, wheezing. GI: heartburn, indigestion, abdominal pain, nausea, vomiting, diarrhea, constipation, change in bowel habits, loss of appetite, hematemesis, melena, hematochezia.  GU: dysuria, change in color of urine, urgency or  frequency, flank pain, hematuria. MSK: joint pain or swelling, decreased range of motion. Psych: change in mood or affect, depression, anxiety, suicidal ideations, homicidal ideations. Skin: rash, itching, bruising.  Past Medical History  She,  has a past medical history of Anginal pain ([place]), Arthritis, Asthma, Coronary artery disease, Diabetes mellitus, Dyspnea, Early cataracts, bilateral, GERD (gastroesophageal reflux disease), Hypertension, Lung cancer ([place]), Mass of upper lobe of left lung, Palpitations, Pneumonia, Stroke ([place]), Wears dentures, and Wears glasses.   Surgical History    Past Surgical History:  Procedure Laterality Date  . ABDOMINAL HYSTERECTOMY    . ANKLE SURGERY Left    fusion  . COLONOSCOPY W/ BIOPSIES AND POLYPECTOMY    . MULTIPLE TOOTH EXTRACTIONS    . TOTAL KNEE ARTHROPLASTY Right   . VIDEO ASSISTED THORACOSCOPY (VATS)/  LOBECTOMY Left 04/21/2017   Procedure: VIDEO ASSISTED THORACOSCOPY (VATS)/LEFT UPPER LOBECTOMY;  Surgeon: Melrose Nakayama, MD;  Location: [facility];  Service: Thoracic;  Laterality: Left;  Marland Kitchen VIDEO BRONCHOSCOPY WITH ENDOBRONCHIAL NAVIGATION N/A 01/21/2017   Procedure: VIDEO BRONCHOSCOPY WITH ENDOBRONCHIAL NAVIGATION;  Surgeon: Melrose Nakayama, MD;  Location: [facility];  Service: Thoracic;  Laterality: N/A;  . VIDEO BRONCHOSCOPY WITH ENDOBRONCHIAL ULTRASOUND N/A 01/21/2017   Procedure: VIDEO BRONCHOSCOPY WITH ENDOBRONCHIAL ULTRASOUND;  Surgeon: Melrose Nakayama, MD;  Location: [facility];  Service: Thoracic;  Laterality: N/A;     Social History   reports that she quit smoking about 20 months ago. Her smoking use included cigarettes. She has a 3.00 pack-year smoking history. She has never used smokeless tobacco. She reports that she does not drink alcohol or use drugs.   Family History   Her family history includes Diabetes in her brother, mother, sister, and son; Hyperlipidemia in her sister. There is no history of Cancer.   Allergies Allergies   Allergen Reactions  . Penicillins Rash and Other (See Comments)    PATIENT HAS HAD A PCN REACTION WITH IMMEDIATE RASH, FACIAL/TONGUE/THROAT SWELLING, SOB, OR LIGHTHEADEDNESS WITH HYPOTENSION:  #  #  #  YES  #  #  #  Has patient had a PCN reaction causing severe rash involving mucus membranes or skin necrosis: NO Has patient had a PCN reaction that required hospitalization NO Has patient had a PCN reaction occurring within the last 10 years: NO  . Codeine Rash     Home Medications  Prior to Admission medications   Medication Sig Start Date End Date Taking? Authorizing Provider  amLODipine (NORVASC) 10 MG tablet Take 10 mg by mouth daily.      [provider]  AQUALANCE LANCETS 30G [place]  11/02/17   [provider]  aspirin EC 81 MG tablet Take 81 mg by mouth daily.      [provider]  atorvastatin (LIPITOR) 20 MG tablet  11/16/17   [provider]  Blood Glucose Calibration (OT ULTRA/FASTTK CNTRL SOLN) SOLN  11/02/17   [provider]  Blood Glucose Monitoring Suppl (ONE TOUCH ULTRA 2) w/Device KIT  11/02/17   [provider]  Cholecalciferol (VITAMIN D3 PO) Take 1 tablet by mouth daily.    [provider]  ergocalciferol (VITAMIN D2) 50000 units capsule ergocalciferol (vitamin D2) 50,000 unit capsule    [provider]  guaifenesin (ROBITUSSIN) 100 MG/5ML syrup Take 200 mg by mouth 3 (three) times daily as needed for cough.    [provider]  hydrALAZINE (APRESOLINE) 25 MG tablet Take 25 mg by mouth 2 (two) times daily.     [provider]  isosorbide dinitrate (ISORDIL) 20 MG tablet Take 20 mg by mouth 2 (two) times daily.     [provider]  Lancet Devices (ADJUSTABLE LANCING DEVICE) MISC  11/02/17   [provider]  linagliptin (TRADJENTA) 5 MG TABS tablet Tradjenta 5 mg tablet    [provider]  losartan (COZAAR) 50 MG tablet Take 50 mg by mouth daily.    [provider]  meclizine (ANTIVERT) 25 MG tablet Take 25 mg by mouth 2 (two) times daily as needed for dizziness.    [provider]  metFORMIN (GLUCOPHAGE) 500 MG tablet Take 500 mg by mouth 2 (two) times daily.     [provider]  metoprolol succinate (TOPROL-XL) 25 MG 24 hr tablet Take 25 mg by mouth every  evening.     [provider]  montelukast (SINGULAIR) 10 MG tablet montelukast 10 mg tablet    [provider]  nitroGLYCERIN (NITROSTAT) 0.4 MG SL tablet Place 0.4 mg under the tongue every 5 (five) minutes as needed for chest pain.    [provider]  OMEPRAZOLE PO Take by mouth every other day. ALTERNATE WITH TUMS    [provider]  ONE TOUCH ULTRA TEST test strip  11/02/17   [provider]  pioglitazone (ACTOS) 30 MG tablet Take 30 mg by mouth every evening.     [provider]  traMADol (ULTRAM) 50 MG tablet tramadol 50 mg tablet    [provider]  valsartan (DIOVAN) 160 MG tablet TK 1 T PO QD 11/29/17   [provider]    I have independently seen and examined the patient, reviewed data, and developed an assessment and plan. A total of 36 minutes were spent in critical care assessment and medical decision making. This critical care time does not reflect procedure time, or teaching time or supervisory time of PA/NP/Med student/Med Resident, etc but could involve care discussion time.   Bonna Gains, MD PhD  11/30/2018, 8:58 AM

## 2018-11-30 NOTE — Plan of Care (Signed)
Progressing on all goals at this time.

## 2018-11-30 NOTE — Progress Notes (Signed)
PT Cancellation Note  Patient Details Name: [name] [name] MRN: [id number] DOB: 1934-10-23   Cancelled Treatment:    Reason Eval/Treat Not Completed: Active bedrest order   Ellamae Sia, PT, DPT Acute Rehabilitation Services Pager [phone] Office [phone]    Willy Eddy 11/30/2018, 7:43 AM

## 2018-11-30 NOTE — Evaluation (Signed)
Speech Language Pathology Evaluation Patient Details Name: [name] [name] MRN: [id number] DOB: 1935-01-09 Today's Date: 11/30/2018 Time: 6761-9509 SLP Time Calculation (min) (ACUTE ONLY): 26 min  Problem List:  Patient Active Problem List   Diagnosis Date Noted  . SAH (subarachnoid hemorrhage) ([place]) 11/29/2018  . S/P lobectomy of lung 04/21/2017  . 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 ([place]) 02/16/2017  . Solitary pulmonary nodule 02/08/2017  . Shortness of breath 12/29/2016    Class: Acute  . Peripheral vascular disease, unspecified ([place]) 07/12/2013  . Atherosclerosis of native arteries of the extremities with intermittent claudication 07/12/2013  . Chest pain, cardiac 06/08/2011    Class: Acute  . Diabetes mellitus ([place]) 06/08/2011    Class: History of  . HTN (hypertension), benign 06/08/2011    Class: Chronic  . Obesity (BMI 30-39.9) 06/08/2011    Class: History of  . Coronary artery disease 06/08/2011    Class: History of   Past Medical History:  Past Medical History:  Diagnosis Date  . Anginal pain ([place])   . Arthritis   . Asthma   . Coronary artery disease   . Diabetes mellitus   . Dyspnea   . Early cataracts, bilateral   . GERD (gastroesophageal reflux disease)    occ  . Hypertension   . Lung cancer ([place])   . Mass of upper lobe of left lung    mediastinal adenopathy  . Palpitations   . Pneumonia   . Stroke [facility])    mini stroke  . Wears dentures   . Wears glasses    Past Surgical History:  Past Surgical History:  Procedure Laterality Date  . ABDOMINAL HYSTERECTOMY    . ANKLE SURGERY Left    fusion  . COLONOSCOPY W/ BIOPSIES AND POLYPECTOMY    . MULTIPLE TOOTH EXTRACTIONS    . TOTAL KNEE ARTHROPLASTY Right   . VIDEO ASSISTED THORACOSCOPY (VATS)/ LOBECTOMY Left 04/21/2017   Procedure: VIDEO ASSISTED THORACOSCOPY (VATS)/LEFT UPPER LOBECTOMY;  Surgeon: Melrose Nakayama, MD;  Location: [facility];  Service: Thoracic;  Laterality: Left;   Marland Kitchen VIDEO BRONCHOSCOPY WITH ENDOBRONCHIAL NAVIGATION N/A 01/21/2017   Procedure: VIDEO BRONCHOSCOPY WITH ENDOBRONCHIAL NAVIGATION;  Surgeon: Melrose Nakayama, MD;  Location: [facility];  Service: Thoracic;  Laterality: N/A;  . VIDEO BRONCHOSCOPY WITH ENDOBRONCHIAL ULTRASOUND N/A 01/21/2017   Procedure: VIDEO BRONCHOSCOPY WITH ENDOBRONCHIAL ULTRASOUND;  Surgeon: Melrose Nakayama, MD;  [facility];  Service: Thoracic;  Laterality: N/A;   HPI:  Pt is an 84 year old with history of COPD, lung cancer, diabetes, and hypertension who was dmitted with sudden onset headache, and neck pain. She was found to have a subarachnoid hemorrhage with hypertensive emergency. CT of the head revealed subarachnoid hemorrhage at the craniocervical junction on the left. Small amount migration of blood dependent within the occipital horns of the lateral ventricles and in the fourth ventricle. Pt is scheduled for an angiogram today and neurosurgery plan will be pending those results.    Assessment / Plan / Recommendation Clinical Impression  Pt currrently resides with her son and reported that she was independently managing her medication and finances prior to admission. She denied any baseline or new deficits in speech, language, or cognition. Based on today's assessment, her speech and language skills are currently within normal limits and no overt cognitive deficits were noted. Further skilled SLP services are not clinically indicated at this time. Pt, and nursing were educated regarding results and recommendations; both parties verbalized understanding as well as  agreement with plan of care.    SLP Assessment  SLP Recommendation/Assessment: Patient does not need any further Speech Lanaguage Pathology Services SLP Visit Diagnosis: Aphasia (R47.01)    Follow Up Recommendations  None    Frequency and Duration           SLP Evaluation Cognition  Overall Cognitive Status: Within Functional Limits for tasks  assessed Arousal/Alertness: Awake/alert Orientation Level: Oriented X4 Attention: Focused;Sustained Focused Attention: Appears intact Sustained Attention: Appears intact Memory: Appears intact(Immediate: 3/3; Delayed: 3/3) Awareness: Appears intact Problem Solving: Appears intact(5/5) Executive Function: Sequencing Sequencing: Appears intact       Comprehension  Auditory Comprehension Overall Auditory Comprehension: Appears within functional limits for tasks assessed Yes/No Questions: Within Functional Limits Basic Biographical Questions: (5/5) Complex Questions: (4/5) Paragraph Comprehension (via yes/no questions): (2/4) Commands: Within Functional Limits(2 & 3 step: 4/4) Visual Recognition/Discrimination Discrimination: Within Function Limits Reading Comprehension Reading Status: Within funtional limits    Expression Expression Primary Mode of Expression: Verbal Verbal Expression Overall Verbal Expression: Appears within functional limits for tasks assessed Initiation: No impairment Automatic Speech: Counting;Day of week;Month of year Level of Generative/Spontaneous Verbalization: Sentence;Conversation Repetition: No impairment(5/5) Naming: No impairment Responsive: (5/5) Confrontation: Within functional limits(10/10) Convergent: (Sentence completion: 5/5)   Oral / Motor  Oral Motor/Sensory Function Overall Oral Motor/Sensory Function: Within functional limits Motor Speech Overall Motor Speech: Appears within functional limits for tasks assessed Respiration: Within functional limits Phonation: Normal Resonance: Within functional limits Articulation: Within functional limitis Intelligibility: Intelligible Motor Planning: Witnin functional limits Motor Speech Errors: Not applicable   Adreana Coull I. Hardin Negus, [place], [place] Office number [phone] Pager [place] 11/30/2018, 9:18 AM

## 2018-11-30 NOTE — Progress Notes (Signed)
OT Cancellation Note  Patient Details Name: [name] [name] MRN: [id number] DOB: 1935/07/23   Cancelled Treatment:    Reason Eval/Treat Not Completed: Active bedrest order.  Will reattempt.  Lucille Passy, OTR/L Acute Rehabilitation Services Pager [phone] Office [phone]   Lucille Passy M 11/30/2018, 1:31 PM

## 2018-11-30 NOTE — Progress Notes (Signed)
eLink Physician-Brief Progress Note Patient Name: [name] [name] DOB: 09/26/1934 MRN: [id number]   Date of Service  11/30/2018  HPI/Events of Note  84 yo F admitted with a subarachnoid hemorrhage and hypertensive emergency, RN requesting prn medications for headache/ pain  eICU Interventions  iv Acetaminophen 1 gm  Q 6 hours prn headache        [name] [name] U [name] [name] 11/30/2018, 12:08 AM

## 2018-11-30 NOTE — Consult Note (Addendum)
Neurosurgery Consultation  Reason for Consult: Subarachnoid hemorrhage Referring Physician: Mannam  CC: Headache  HPI: This is a 84 y.o. woman w/ h/o COPD, lung Ca, DM, HTN that presents with acute onset WHOL with neck pain and stiffness. She noticed the headache when she awoke yesterday morning (4/29). Before yesterday morning, she did not have any headaches in the past week or other neurologic symptoms. Headache is worst headache of life, diffuse, no significant photophobia, neck pain is equally severe as the headache. She denies recent use of anti-platelet or anti-coagulant medications, but EMR shows she takes ASA81 as an outpatient as well as nitrostat prn, so likely takes it for coronary disease. Lung Ca was dx'd in 2018 s/p LULobectomy, was stage 1 NSCLC and CT in 06/2018 showed no active disease or recurrence.    ROS: A 14 point ROS was performed and is negative except as noted in the HPI.   PMHx:  Past Medical History:  Diagnosis Date  . Anginal pain ([place])   . Arthritis   . Asthma   . Coronary artery disease   . Diabetes mellitus   . Dyspnea   . Early cataracts, bilateral   . GERD (gastroesophageal reflux disease)    occ  . Hypertension   . Lung cancer ([place])   . Mass of upper lobe of left lung    mediastinal adenopathy  . Palpitations   . Pneumonia   . Stroke [facility])    mini stroke  . Wears dentures   . Wears glasses    FamHx:  Family History  Problem Relation Age of Onset  . Diabetes Mother   . Diabetes Sister   . Hyperlipidemia Sister   . Diabetes Brother   . Diabetes Son   . Cancer Neg Hx    SocHx:  reports that she quit smoking about 20 months ago. Her smoking use included cigarettes. She has a 3.00 pack-year smoking history. She has never used smokeless tobacco. She reports that she does not drink alcohol or use drugs.  Exam: Vital signs in last 24 hours: Temp:  [98.2 F (36.8 C)-99.5 F (37.5 C)] 98.9 F (37.2 C) (04/30 0400) Pulse Rate:  [52-107] 58  (04/30 0530) Resp:  [14-33] 24 (04/30 0530) BP: (125-185)/(51-111) 125/62 (04/30 0530) SpO2:  [84 %-100 %] 98 % (04/30 0530) Weight:  [79.8 kg-81.2 kg] 79.8 kg (04/30 0103) General: Awake, alert, cooperative, lying in bed in NAD Head: normocephalic and atruamatic HEENT: +neck stiffness, +kernig's  Pulmonary: breathing room air comfortably, no evidence of increased work of breathing Cardiac: bradycardic but regular Abdomen: S NT ND Extremities: warm and well perfused x4 Neuro: AOx3, PERRL, EOMI, FS Strength 5/5 x4, SILTx4, no drift  Assessment and Plan: 84 y.o. woman with acute onset WHOL, HH1 and mF1. CTA head & neck personally reviewed, which shows some acute blood products in the left cerebellomedullary cistern with increased vascularity with tortuosity in the region of the left PICA, concerning for AVM. +ASA81  -catheter angiogram today versus tomorrow, keep NPO, Dr. Kathyrn Sheriff will be performing the angiogram and will update plan of care accordingly -SBP<160 -remainder of neurosurgical plan will depend on angiogram results -since she may get sedation later for the angio, discussed healthcare proxy with her, she would like her daughter [name] to make decisions for her if she is unable to do so. I will call her daughter this morning and update her.   Judith Part, MD 11/30/18 6:15 AM [facility]

## 2018-11-30 NOTE — Sedation Documentation (Signed)
Patient is resting comfortably. 

## 2018-11-30 NOTE — Sedation Documentation (Signed)
Vital signs stable. Pt resting, following commands. Procedure started, tolerating well at this time

## 2018-12-01 ENCOUNTER — Inpatient Hospital Stay (HOSPITAL_COMMUNITY): Payer: Medicare HMO

## 2018-12-01 DIAGNOSIS — I739 Peripheral vascular disease, unspecified: Secondary | ICD-10-CM

## 2018-12-01 DIAGNOSIS — I609 Nontraumatic subarachnoid hemorrhage, unspecified: Secondary | ICD-10-CM

## 2018-12-01 LAB — GLUCOSE, CAPILLARY
Glucose-Capillary: 134 mg/dL — ABNORMAL HIGH (ref 70–99)
Glucose-Capillary: 135 mg/dL — ABNORMAL HIGH (ref 70–99)
Glucose-Capillary: 161 mg/dL — ABNORMAL HIGH (ref 70–99)
Glucose-Capillary: 86 mg/dL (ref 70–99)
Glucose-Capillary: 190 mg/dL — ABNORMAL HIGH (ref 70–99)

## 2018-12-01 MED ORDER — ACETAMINOPHEN 325 MG PO TABS
650.0000 mg | ORAL_TABLET | Freq: Four times a day (QID) | ORAL | Status: DC | PRN
  Administered 2018-12-01 – 2018-12-08 (×13): 650 mg via ORAL
  Filled 2018-12-01 (×14): qty 2

## 2018-12-01 NOTE — Progress Notes (Signed)
OT Cancellation Note  Patient Details Name: [name] [name] MRN: [id number] DOB: 10-31-1934   Cancelled Treatment:    Reason Eval/Treat Not Completed: Active bedrest order; will follow.   Lou Cal, OT Supplemental Rehabilitation Services Pager [phone] Office [place] 12/01/2018, 8:14 AM

## 2018-12-01 NOTE — Progress Notes (Signed)
Spoke with [facility]; they wish to keep pt in ICU setting for monitoring.   They will assume care. PCCM will sign off for now but remain available if needed

## 2018-12-01 NOTE — Evaluation (Signed)
Physical Therapy Evaluation Patient Details Name: [name] [name] MRN: [id number] DOB: April 22, 1935 Today's Date: 12/01/2018   History of Present Illness  84 year old with history of COPD, lung cancer, diabetes, hypertension, hx of L upper lobectomy 2018.  Admitted with sudden onset headache, neck pain.  Found to have a subarachnoid hemorrhage with hypertensive emergency. Cerebral angiogram completed 4/30 with findings normal, no aneurysm, AVM, or fistula identified.  Clinical Impression  Pt admitted with above. Prior to admission, pt independent with ADL's and occasionally using cane for mobility. On PT evaluation, pt requiring min guard assist for ambulating 100 feet using walker. Requiring less assistance with use of assistive device versus without. Presents with generalized weakness and balance impairments. SBP > 140 throughout. Suspect pt will progress well. Recommending HHPT at discharge.     Follow Up Recommendations Home health PT;Supervision for mobility/OOB    Equipment Recommendations  None recommended by PT    Recommendations for Other Services       Precautions / Restrictions Precautions Precautions: Fall;Other (comment) Precaution Comments: SBP<140 Restrictions Weight Bearing Restrictions: No      Mobility  Bed Mobility Overal bed mobility: Needs Assistance Bed Mobility: Supine to Sit     Supine to sit: Min guard     General bed mobility comments: for lines, safety; HOB slightly elevated  Transfers Overall transfer level: Needs assistance Equipment used: Rolling walker (2 wheeled) Transfers: Sit to/from Stand Sit to Stand: Min assist         General transfer comment: boosting assist to rise and steady at [place]; pt stood from EOB and from toilet   Ambulation/Gait Ambulation/Gait assistance: Min assist;Min guard Gait Distance (Feet): 100 Feet Assistive device: Rolling walker (2 wheeled);None Gait Pattern/deviations: Step-through pattern;Decreased stride  length;Trunk flexed Gait velocity: decr   General Gait Details: Pt initially requiring min assist with no AD but progressing to min guard with use of walker. Cues for increased gait speed and walker proximity  Stairs            Wheelchair Mobility    Modified Rankin (Stroke Patients Only) Modified Rankin (Stroke Patients Only) Pre-Morbid Rankin Score: No significant disability Modified Rankin: Moderately severe disability     Balance Overall balance assessment: Needs assistance Sitting-balance support: Feet supported Sitting balance-Leahy Scale: Good     Standing balance support: Bilateral upper extremity supported;During functional activity Standing balance-Leahy Scale: Fair Standing balance comment: able to maintain static standing with close minguard assist; reliant on UE support during mobility tasks                             Pertinent Vitals/Pain Pain Assessment: No/denies pain    Home Living Family/patient expects to be discharged to:: Private residence Living Arrangements: Children(son) Available Help at Discharge: Available PRN/intermittently;Family Type of Home: House Home Access: Stairs to enter   [employer] of Steps: 1 Home Layout: One level Home Equipment: [employer] - 4 wheels;Cane - single point;Grab bars - toilet      Prior Function Level of Independence: Independent with assistive device(s)         Comments: uses cane, driving, performing iADL, enjoys shopping     Hand Dominance   Dominant Hand: Right    Extremity/Trunk Assessment   Upper Extremity Assessment Upper Extremity Assessment: Defer to OT evaluation    Lower Extremity Assessment Lower Extremity Assessment: Generalized weakness       Communication   Communication: No difficulties  Cognition Arousal/Alertness: Awake/alert  Behavior During Therapy: WFL for tasks assessed/performed Overall Cognitive Status: Within Functional Limits for tasks  assessed                                        General Comments General comments (skin integrity, edema, etc.): VSS (BP monitored and SBP maintaining <140)     Exercises     Assessment/Plan    PT Assessment Patient needs continued PT services  PT Problem List Decreased strength;Decreased activity tolerance;Decreased balance;Decreased mobility       PT Treatment Interventions DME instruction;Gait training;Stair training;Functional mobility training;Therapeutic activities;Therapeutic exercise;Balance training;Patient/family education    PT Goals (Current goals can be found in the Care Plan section)  Acute Rehab PT Goals Patient Stated Goal: get back to shopping PT Goal Formulation: With patient Time For Goal Achievement: 12/15/18 Potential to Achieve Goals: Good    Frequency Min 4X/week   Barriers to discharge        Co-evaluation               AM-PAC PT "6 Clicks" Mobility  Outcome Measure Help needed turning from your back to your side while in a flat bed without using bedrails?: None Help needed moving from lying on your back to sitting on the side of a flat bed without using bedrails?: A Little Help needed moving to and from a bed to a chair (including a wheelchair)?: A Little Help needed standing up from a chair using your arms (e.g., wheelchair or bedside chair)?: A Little Help needed to walk in hospital room?: A Little Help needed climbing 3-5 steps with a railing? : A Lot 6 Click Score: 18    End of Session Equipment Utilized During Treatment: Gait belt Activity Tolerance: Patient tolerated treatment well Patient left: in chair;with call bell/phone within reach;with chair alarm set Nurse Communication: Mobility status PT Visit Diagnosis: Unsteadiness on feet (R26.81);Difficulty in walking, not elsewhere classified (R26.2)    Time: 1017-5102 PT Time Calculation (min) (ACUTE ONLY): 25 min   Charges:   PT Evaluation $PT Eval Moderate  Complexity: 1 Mod         [name] [name], [place], DPT Acute Rehabilitation Services Pager [phone] Office [phone]   Willy Eddy 12/01/2018, 2:27 PM

## 2018-12-01 NOTE — Evaluation (Signed)
Occupational Therapy Evaluation Patient Details Name: [name] [name] MRN: [id number] DOB: 12/26/1934 Today's Date: 12/01/2018    History of Present Illness 84 year old with history of COPD, lung cancer, diabetes, hypertension, hx of L upper lobectomy 2018.  Admitted with sudden onset headache, neck pain.  Found to have a subarachnoid hemorrhage with hypertensive emergency. Cerebral angiogram completed 4/30 with findings normal, no aneurysm, AVM, or fistula identified.   Clinical Impression   This 84 y/o female presents with the above. PTA pt reports she was mod independent with ADL, iADL and functional mobility intermittently using SPC. Pt completing functional mobility this session using RW initially requiring minA progressed to minguard assist as session continued. She currently requires minA for toileting, standing grooming and LB ADL, setup-minguard assist for seated UB ADL. Pt mostly limited due to generalized weakness and decreased activity tolerance at this time. BP monitored and SBP maintaining <140 during session with additional VSS. Pt reports her son lives at home with her and is able to provide assist/supervision at time of discharge. She will benefit from continued acute OT services and recommend follow up [facility] therapy services after discharge to maximize her safety and independence with ADL and mobility. Will follow.     Follow Up Recommendations  Home health OT;Supervision/Assistance - 24 hour    Equipment Recommendations  Tub/shower seat           Precautions / Restrictions Precautions Precautions: Fall;Other (comment) Precaution Comments: SBP<140 Restrictions Weight Bearing Restrictions: No      Mobility Bed Mobility Overal bed mobility: Needs Assistance Bed Mobility: Supine to Sit     Supine to sit: Min guard     General bed mobility comments: for lines, safety; HOB slightly elevated  Transfers Overall transfer level: Needs assistance Equipment used:  Rolling walker (2 wheeled) Transfers: Sit to/from Stand Sit to Stand: Min assist         General transfer comment: boosting assist to rise and steady at [place]; pt stood from EOB and from toilet     Balance Overall balance assessment: Needs assistance Sitting-balance support: Feet supported Sitting balance-Leahy Scale: Good     Standing balance support: Bilateral upper extremity supported;During functional activity Standing balance-Leahy Scale: Fair Standing balance comment: able to maintain static standing with close minguard assist; reliant on UE support during mobility tasks                           ADL either performed or assessed with clinical judgement   ADL Overall ADL's : Needs assistance/impaired Eating/Feeding: Modified independent;Sitting   Grooming: Wash/dry hands;Min guard;Minimal assistance;Standing Grooming Details (indicate cue type and reason): minguard-minA for standing balance Upper Body Bathing: Set up;Min guard;Sitting   Lower Body Bathing: Minimal assistance;Sit to/from stand   Upper Body Dressing : Set up;Min guard;Sitting   Lower Body Dressing: Minimal assistance;Moderate assistance;Sit to/from stand Lower Body Dressing Details (indicate cue type and reason): pt utilizing figure 4 technique to adjust socks seated EOB; minguard-minA for standing balance Toilet Transfer: Minimal assistance;Min guard;RW;Regular Toilet;Grab bars Toilet Transfer Details (indicate cue type and reason): minA to rise from toilet; minA initially using HHA for mobility into bathroom, use of RW when exiting bathroom and able to progress to minguard for mobility into bathroom Toileting- Clothing Manipulation and Hygiene: Minimal assistance;Sit to/from stand Toileting - Clothing Manipulation Details (indicate cue type and reason): assist for gown management     Functional mobility during ADLs: Minimal assistance;Min guard;+2 for safety/equipment;Rolling walker General  ADL  Comments: pt mostly limited due to decreased activity tolerance, generalized weakness     Vision Baseline Vision/History: Wears glasses       Perception     Praxis      Pertinent Vitals/Pain Pain Assessment: No/denies pain     Hand Dominance Right   Extremity/Trunk Assessment Upper Extremity Assessment Upper Extremity Assessment: Generalized weakness   Lower Extremity Assessment Lower Extremity Assessment: Defer to PT evaluation       Communication Communication Communication: No difficulties   Cognition Arousal/Alertness: Awake/alert Behavior During Therapy: WFL for tasks assessed/performed Overall Cognitive Status: Within Functional Limits for tasks assessed                                     General Comments  VSS (BP monitored and SBP maintaining <140)     Exercises     Shoulder Instructions      Home Living Family/patient expects to be discharged to:: Private residence Living Arrangements: Children(son) Available Help at Discharge: Available PRN/intermittently;Family Type of Home: House Home Access: Stairs to enter [employer] of Steps: 1   Home Layout: One level     Bathroom Shower/Tub: Tub/shower unit         Home Equipment: Environmental consultant - 4 wheels;Cane - single point;Grab bars - toilet          Prior Functioning/Environment Level of Independence: Independent with assistive device(s)        Comments: uses cane, driving, performing iADL, enjoys shopping        OT Problem List: Decreased strength;Decreased range of motion;Decreased activity tolerance;Impaired balance (sitting and/or standing);Decreased knowledge of use of DME or AE      OT Treatment/Interventions: Self-care/ADL training;Therapeutic exercise;Neuromuscular education;Energy conservation;DME and/or AE instruction;Therapeutic activities;Patient/family education;Balance training    OT Goals(Current goals can be found in the care plan section) Acute Rehab  OT Goals Patient Stated Goal: get back to shopping OT Goal Formulation: With patient Time For Goal Achievement: 12/15/18 Potential to Achieve Goals: Good  OT Frequency: Min 2X/week   Barriers to D/C:            Co-evaluation              AM-PAC OT "6 Clicks" Daily Activity     Outcome Measure Help from another person eating meals?: None Help from another person taking care of personal grooming?: A Little Help from another person toileting, which includes using toliet, bedpan, or urinal?: A Little Help from another person bathing (including washing, rinsing, drying)?: A Little Help from another person to put on and taking off regular upper body clothing?: None Help from another person to put on and taking off regular lower body clothing?: A Lot 6 Click Score: 19   End of Session Equipment Utilized During Treatment: Gait belt;Rolling walker Nurse Communication: Mobility status  Activity Tolerance: Patient tolerated treatment well Patient left: in chair;with call bell/phone within reach;with chair alarm set;with nursing/sitter in room  OT Visit Diagnosis: Unsteadiness on feet (R26.81);Muscle weakness (generalized) (M62.81)                Time: 2956-2130 OT Time Calculation (min): 26 min Charges:  OT General Charges $OT Visit: 1 Visit OT Evaluation $OT Eval Moderate Complexity: [place], OT [employer] Pager [phone] Office [place] 12/01/2018, 1:43 PM

## 2018-12-01 NOTE — Plan of Care (Signed)
Progressing on all goals at this time.

## 2018-12-01 NOTE — Progress Notes (Signed)
NAME:  [name] [name], MRN:  [id number], DOB:  Jul 26, 1935, LOS: 2 ADMISSION DATE:  11/29/2018, CONSULTATION DATE:  11/29/2018 REFERRING MD:  EDP, CHIEF COMPLAINT:  SAH, hypertensive emergency  Brief History   84 year old with history of COPD, lung cancer, diabetes, hypertension.  Admitted with sudden onset headache, neck pain.  Found to have a subarachnoid hemorrhage with hypertensive emergency. Neurosurgery consulted.  PCCM asked to admit.  Patient had been in her usual state of health yesterday.  She has chronic cough at baseline.  No fevers, dyspnea, recent travel or sick contacts.  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  Possible AV malformation or dural AV fistula in the region of hemorrhage.  4/30: angiogram (Dr. Kathyrn Sheriff) -no AVM, fistula, aneursym   Micro Data:   n/a Antimicrobials:   n/a Interim history/subjective:   No acute events o/n. Pt reports improving neck stiffness. Headache still present but almost resolved. Pt denies changes in smell, vision, sensation, memory, or difficulty swallowing.  Objective   Blood pressure (!) 105/48, pulse (!) 53, temperature 98.7 F (37.1 C), temperature source Oral, resp. rate (!) 21, height 5' 9" (1.753 m), weight 79.8 kg, SpO2 95 %.        Intake/Output Summary (Last 24 hours) at 12/01/2018 0818 Last data filed at 12/01/2018 0400 Gross per 24 hour  Intake 511.87 ml  Output 750 ml  Net -238.13 ml   Filed Weights   11/29/18 1429 11/30/18 0000 11/30/18 0103  Weight: 81.2 kg 79.8 kg 79.8 kg   Examination: Blood pressure (!) 185/80, pulse 80, temperature 98.5 F (36.9 C), temperature source Oral, resp. rate 19, height 5' 9" (1.753 m),  weight 81.2 kg, SpO2 100 %. Gen:      No acute distress, elderly HEENT:  AT/[place]; EOMI, PERRLA sclera anicteric Neck:     No masses; no thyromegaly; full ROM, slight tenderness on L scalenes Lungs:    Clear to auscultation bilaterally; normal respiratory effort CV:         Regular rate and rhythm; no murmurs Abd:      bowel sounds; soft, non-tender; no palpable masses, no distension Ext:    No edema; adequate peripheral perfusion Skin:      Warm and dry; no rash Neuro: alert and oriented x 3; CN III-XII intact to confrontation, MAE on request  Resolved Hospital Problem list     Assessment & Plan:  83 year old with hypertension at baseline.  Admitted with subarachnoid hemorrhage, hypertensive emergency  Subarachnoid hemorrhage Admit to neuro ICU Neuro checks. Nimodipine off Transcranial dropplers Neurosurgery consulted by ED.  Dr. Zada Finders recc's angio with Dr. Kathyrn Sheriff for ? Of AVM.   Hypertensive emergency Cardene off.  Goal systolic blood pressure < 160  DM SSI coverage Holding home DM meds  COPD, lung cancer s/p resection Nebs PRN  Best practice:  Diet: advance to regular,  Pain/Anxiety/Delirium protocol (if indicated): NA VAP protocol (if indicated): NA DVT prophylaxis: SCDs GI prophylaxis: NA Glucose control: SSI Mobility: OOB, PT eval  Code Status: Full Family Communication: Daughter updated over telephone 4/30; will await studies and updated reccs Disposition: to floor with hospitalist service  Labs   CBC: Recent Labs  Lab 11/29/18 1630  WBC 5.9  NEUTROABS 3.8  HGB 10.4*  HCT 32.4*  MCV 81.2  PLT 419    Basic Metabolic Panel: Recent Labs  Lab 11/29/18 1630 11/30/18 0429  NA 140 139  K 4.4 3.7  CL 110 109  CO2 22 20*  GLUCOSE 145* 129*  BUN 15 13  CREATININE 1.01* 1.00  CALCIUM 8.9 9.2  MG  --  1.8  PHOS  --  3.5   GFR: Estimated Creatinine Clearance: 49.9 mL/min (by C-G formula based on SCr of 1 mg/dL). Recent Labs  Lab 11/29/18  1630  WBC 5.9    Liver Function Tests: Recent Labs  Lab 11/30/18 0429  ALBUMIN 3.7   No results for input(s): LIPASE, AMYLASE in the last 168 hours. No results for input(s): AMMONIA in the last 168 hours.  ABG    Component Value Date/Time   PHART 7.354 04/22/2017 0248   PCO2ART 35.9 04/22/2017 0248   PO2ART 65.0 (L) 04/22/2017 0248   HCO3 20.0 04/22/2017 0248   TCO2 21 (L) 04/22/2017 0248   ACIDBASEDEF 5.0 (H) 04/22/2017 0248   O2SAT 91.0 04/22/2017 0248     Coagulation Profile: No results for input(s): INR, PROTIME in the last 168 hours.  Cardiac Enzymes: No results for input(s): CKTOTAL, CKMB, CKMBINDEX, TROPONINI in the last 168 hours.  HbA1C: Hgb A1c MFr Bld  Date/Time Value Ref Range Status  04/19/2017 11:43 AM 6.1 (H) 4.8 - 5.6 % Final    Comment:    (NOTE) Pre diabetes:          5.7%-6.4% Diabetes:              >6.4% Glycemic control for   <7.0% adults with diabetes   12/31/2016 05:52 AM 6.1 (H) 4.8 - 5.6 % Final    Comment:    (NOTE)         Pre-diabetes: 5.7 - 6.4         Diabetes: >6.4         Glycemic control for adults with diabetes: <7.0     CBG: Recent Labs  Lab 11/30/18 1549 11/30/18 1925 11/30/18 2323 12/01/18 0323 12/01/18 0752  GLUCAP 115* 162* 139* 134* 135*    Review of Systems:   All negative; except for those that are bolded, which indicate positives.  Constitutional: weight loss, weight gain, night sweats, fevers, chills, fatigue, weakness.  HEENT: headaches, sore throat, sneezing, nasal congestion, post nasal drip, difficulty swallowing, tooth/dental problems, visual complaints, visual changes, ear aches. Neuro: difficulty with speech, weakness, numbness, ataxia. CV:  chest pain, orthopnea, PND, swelling in lower extremities, dizziness, palpitations, syncope.  Resp: cough, hemoptysis, dyspnea, wheezing. GI: heartburn, indigestion, abdominal pain, nausea, vomiting, diarrhea, constipation, change in bowel habits, loss of  appetite, hematemesis, melena, hematochezia.  GU: dysuria, change in color of urine, urgency or frequency, flank pain, hematuria. MSK: joint pain or swelling, decreased range of motion. Psych: change in mood or affect, depression, anxiety, suicidal ideations, homicidal ideations. Skin: rash, itching, bruising.  Past Medical History  She,  has a past medical history of Anginal pain ([place]), Arthritis, Asthma, Coronary artery disease, Diabetes mellitus, Dyspnea, Early cataracts, bilateral, GERD (gastroesophageal reflux disease), Hypertension, Lung cancer ([place]), Mass of upper lobe of left lung, Palpitations, Pneumonia, Stroke ([place]), Wears dentures, and Wears glasses.   Surgical History    Past Surgical History:  Procedure Laterality Date  . ABDOMINAL HYSTERECTOMY    . ANKLE SURGERY Left    fusion  . COLONOSCOPY W/ BIOPSIES AND POLYPECTOMY    .  MULTIPLE TOOTH EXTRACTIONS    . TOTAL KNEE ARTHROPLASTY Right   . VIDEO ASSISTED THORACOSCOPY (VATS)/ LOBECTOMY Left 04/21/2017   Procedure: VIDEO ASSISTED THORACOSCOPY (VATS)/LEFT UPPER LOBECTOMY;  Surgeon: Melrose Nakayama, MD;  Location: [facility];  Service: Thoracic;  Laterality: Left;  Marland Kitchen VIDEO BRONCHOSCOPY WITH ENDOBRONCHIAL NAVIGATION N/A 01/21/2017   Procedure: VIDEO BRONCHOSCOPY WITH ENDOBRONCHIAL NAVIGATION;  Surgeon: Melrose Nakayama, MD;  Location: [facility];  Service: Thoracic;  Laterality: N/A;  . VIDEO BRONCHOSCOPY WITH ENDOBRONCHIAL ULTRASOUND N/A 01/21/2017   Procedure: VIDEO BRONCHOSCOPY WITH ENDOBRONCHIAL ULTRASOUND;  Surgeon: Melrose Nakayama, MD;  Location: [facility];  Service: Thoracic;  Laterality: N/A;     Social History   reports that she quit smoking about 21 months ago. Her smoking use included cigarettes. She has a 3.00 pack-year smoking history. She has never used smokeless tobacco. She reports that she does not drink alcohol or use drugs.   Family History   Her family history includes Diabetes in her brother, mother,  sister, and son; Hyperlipidemia in her sister. There is no history of Cancer.   Allergies Allergies  Allergen Reactions  . Penicillins Rash and Other (See Comments)    PATIENT HAS HAD A PCN REACTION WITH IMMEDIATE RASH, FACIAL/TONGUE/THROAT SWELLING, SOB, OR LIGHTHEADEDNESS WITH HYPOTENSION:  #  #  #  YES  #  #  #  Has patient had a PCN reaction causing severe rash involving mucus membranes or skin necrosis: NO Has patient had a PCN reaction that required hospitalization NO Has patient had a PCN reaction occurring within the last 10 years: NO  . Codeine Rash     Home Medications  Prior to Admission medications   Medication Sig Start Date End Date Taking? Authorizing Provider  amLODipine (NORVASC) 10 MG tablet Take 10 mg by mouth daily.      [provider]  AQUALANCE LANCETS 30G [place]  11/02/17   [provider]  aspirin EC 81 MG tablet Take 81 mg by mouth daily.      [provider]  atorvastatin (LIPITOR) 20 MG tablet  11/16/17   [provider]  Blood Glucose Calibration (OT ULTRA/FASTTK CNTRL SOLN) SOLN  11/02/17   [provider]  Blood Glucose Monitoring Suppl (ONE TOUCH ULTRA 2) w/Device KIT  11/02/17   [provider]  Cholecalciferol (VITAMIN D3 PO) Take 1 tablet by mouth daily.    [provider]  ergocalciferol (VITAMIN D2) 50000 units capsule ergocalciferol (vitamin D2) 50,000 unit capsule    [provider]  guaifenesin (ROBITUSSIN) 100 MG/5ML syrup Take 200 mg by mouth 3 (three) times daily as needed for cough.    [provider]  hydrALAZINE (APRESOLINE) 25 MG tablet Take 25 mg by mouth 2 (two) times daily.     [provider]  isosorbide dinitrate (ISORDIL) 20 MG tablet Take 20 mg by mouth 2 (two) times daily.     [provider]  Lancet Devices (ADJUSTABLE LANCING DEVICE) MISC  11/02/17   [provider]  linagliptin (TRADJENTA) 5 MG TABS tablet Tradjenta 5 mg tablet     [provider]  losartan (COZAAR) 50 MG tablet Take 50 mg by mouth daily.    [provider]  meclizine (ANTIVERT) 25 MG tablet Take 25 mg by mouth 2 (two) times daily as needed for dizziness.    [provider]  metFORMIN (GLUCOPHAGE) 500 MG tablet Take 500 mg by mouth 2 (two) times daily.  [provider]  metoprolol succinate (TOPROL-XL) 25 MG 24 hr tablet Take 25 mg by mouth every evening.     [provider]  montelukast (SINGULAIR) 10 MG tablet montelukast 10 mg tablet    [provider]  nitroGLYCERIN (NITROSTAT) 0.4 MG SL tablet Place 0.4 mg under the tongue every 5 (five) minutes as needed for chest pain.    [provider]  OMEPRAZOLE PO Take by mouth every other day. ALTERNATE WITH TUMS    [provider]  ONE TOUCH ULTRA TEST test strip  11/02/17   [provider]  pioglitazone (ACTOS) 30 MG tablet Take 30 mg by mouth every evening.     [provider]  traMADol (ULTRAM) 50 MG tablet tramadol 50 mg tablet    [provider]  valsartan (DIOVAN) 160 MG tablet TK 1 T PO QD 11/29/17   [provider]    I have independently seen and examined the patient, reviewed data, and developed an assessment and plan. A total of 36 minutes were spent in critical care assessment and medical decision making. This critical care time does not reflect procedure time, or teaching time or supervisory time of PA/NP/Med student/Med Resident, etc but could involve care discussion time.   Bonna Gains, MD PhD  12/01/2018, 8:18 AM

## 2018-12-01 NOTE — Progress Notes (Signed)
  NEUROSURGERY PROGRESS NOTE   No issues overnight. Headache and neck pain improving. No concerns this am  EXAM:  BP (!) 105/48   Pulse (!) 53   Temp 98.7 F (37.1 C) (Oral)   Resp (!) 21   Ht 5\' 9"  (1.753 m)   Wt 79.8 kg   SpO2 95%   BMI 25.98 kg/m   Awake, alert, oriented  Speech fluent, appropriate  CN grossly intact  5/5 BUE/BLE   IMPRESSION/PLAN 84 y.o. female [place] d#3, initial angio negative. Stable neurologically. - continue supportive care, nimotop - PT/OT - SBP goal <140 - Repeat angio next week  Ferne Reus, PA-C [facility]

## 2018-12-01 NOTE — Progress Notes (Signed)
TCD has been completed.   Preliminary results in CV Proc.   [name] [name] 12/01/2018 3:45 PM

## 2018-12-02 DIAGNOSIS — I161 Hypertensive emergency: Secondary | ICD-10-CM

## 2018-12-02 DIAGNOSIS — E785 Hyperlipidemia, unspecified: Secondary | ICD-10-CM

## 2018-12-02 DIAGNOSIS — I10 Essential (primary) hypertension: Secondary | ICD-10-CM

## 2018-12-02 DIAGNOSIS — E1169 Type 2 diabetes mellitus with other specified complication: Secondary | ICD-10-CM

## 2018-12-02 LAB — GLUCOSE, CAPILLARY
Glucose-Capillary: 106 mg/dL — ABNORMAL HIGH (ref 70–99)
Glucose-Capillary: 107 mg/dL — ABNORMAL HIGH (ref 70–99)
Glucose-Capillary: 118 mg/dL — ABNORMAL HIGH (ref 70–99)
Glucose-Capillary: 149 mg/dL — ABNORMAL HIGH (ref 70–99)
Glucose-Capillary: 202 mg/dL — ABNORMAL HIGH (ref 70–99)
Glucose-Capillary: 76 mg/dL (ref 70–99)
Glucose-Capillary: 76 mg/dL (ref 70–99)

## 2018-12-02 NOTE — Progress Notes (Signed)
Physical Therapy Treatment Patient Details Name: [name] [name] MRN: [id number] DOB: Mar 28, 1935 Today's Date: 12/02/2018    History of Present Illness 84 year old with history of COPD, lung cancer, diabetes, hypertension, hx of L upper lobectomy 2018.  Admitted with sudden onset headache, neck pain.  Found to have a subarachnoid hemorrhage with hypertensive emergency. Cerebral angiogram completed 4/30 with findings normal, no aneurysm, AVM, or fistula identified.    PT Comments    Pt pleasant and willing to mobilize with increased gait and activity tolerance this session. Pt continues to rely on UE support for gait with decreased activity tolerance from her baseline of independent and shopping. Pt educated for bil LE HEP and encouraged to continue mobility acutely.   BP 151/51 initially, 138/55 after session   Follow Up Recommendations  Home health PT;Supervision for mobility/OOB     Equipment Recommendations  None recommended by PT    Recommendations for Other Services       Precautions / Restrictions Precautions Precautions: Fall;Other (comment) Precaution Comments: SBP<160    Mobility  Bed Mobility Overal bed mobility: Modified Independent Bed Mobility: Supine to Sit     Supine to sit: Modified independent (Device/Increase time)     General bed mobility comments: increased time with flat bed and no rail   Transfers Overall transfer level: Needs assistance   Transfers: Sit to/from Stand Sit to Stand: Min guard         General transfer comment: guarding for safety and lines  Ambulation/Gait Ambulation/Gait assistance: Min guard Gait Distance (Feet): 200 Feet Assistive device: Rolling walker (2 wheeled) Gait Pattern/deviations: Step-through pattern;Decreased stride length;Trunk flexed   Gait velocity interpretation: 1.31 - 2.62 ft/sec, indicative of limited community ambulator General Gait Details: cues for posture and position in RW with increased gait  tolerance and moving slowly but safely   Stairs Stairs: Yes Stairs assistance: Supervision Stair Management: Step to pattern;With walker;Backwards Number of Stairs: 1 General stair comments: pt educated for and performed stepping backward up the step with RW with reliance on bil UE support to be able to ascend   Wheelchair Mobility    Modified Rankin (Stroke Patients Only) Modified Rankin (Stroke Patients Only) Pre-Morbid Rankin Score: No significant disability Modified Rankin: Moderate disability     Balance Overall balance assessment: Needs assistance Sitting-balance support: Feet supported Sitting balance-Leahy Scale: Good     Standing balance support: Bilateral upper extremity supported;During functional activity Standing balance-Leahy Scale: Fair Standing balance comment: able to maintain static standing with close minguard assist; reliant on UE support during mobility tasks                            Cognition Arousal/Alertness: Awake/alert Behavior During Therapy: WFL for tasks assessed/performed Overall Cognitive Status: Within Functional Limits for tasks assessed                                        Exercises General Exercises - Lower Extremity Long Arc Quad: AROM;15 reps;Seated;Both Hip Flexion/Marching: AROM;10 reps;Seated;Both    General Comments        Pertinent Vitals/Pain Pain Assessment: No/denies pain    Home Living                      Prior Function            PT Goals (current goals can  now be found in the care plan section) Progress towards PT goals: Progressing toward goals    Frequency    Min 4X/week      PT Plan Current plan remains appropriate    Co-evaluation              AM-PAC PT "6 Clicks" Mobility   Outcome Measure  Help needed turning from your back to your side while in a flat bed without using bedrails?: None Help needed moving from lying on your back to sitting on  the side of a flat bed without using bedrails?: None Help needed moving to and from a bed to a chair (including a wheelchair)?: A Little Help needed standing up from a chair using your arms (e.g., wheelchair or bedside chair)?: A Little Help needed to walk in hospital room?: A Little Help needed climbing 3-5 steps with a railing? : A Little 6 Click Score: 20    End of Session Equipment Utilized During Treatment: Gait belt Activity Tolerance: Patient tolerated treatment well Patient left: in chair;with call bell/phone within reach;with chair alarm set Nurse Communication: Mobility status PT Visit Diagnosis: Unsteadiness on feet (R26.81);Difficulty in walking, not elsewhere classified (R26.2)     Time: 8372-9021 PT Time Calculation (min) (ACUTE ONLY): 30 min  Charges:  $Gait Training: 8-22 mins $Therapeutic Exercise: 8-22 mins                     Diavian Furgason Pam Drown, PT Acute Rehabilitation Services Pager: [phone] Office: [place] 12/02/2018, 9:43 AM

## 2018-12-02 NOTE — Progress Notes (Addendum)
PROGRESS NOTE    [name] [name]  HKV:[id number] DOB: Mar 08, 1935 DOA: 11/29/2018 PCP: Willey Blade, MD    Brief Narrative:  84 year old female who presented with hypertensive emergency and subarachnoid hemorrhage.  She does have history of COPD, lung cancer, type 2 diabetes mellitus and hypertension.  She reported acute onset of headache and neck pain.  On her initial physical examination her blood pressure was 185/80, pulse rate 80, temperature 98.5, temperature 98.5, respiratory rate 19, oxygen saturation 100%.  Her neck was supple, her lungs are clear to auscultation bilaterally, heart S1-S2 present rhythmic, the abdomen was soft no lower extremity edema, neurologically patient was nonfocal.  Head CT showed subarachnoid hemorrhage at the craniocervical junction on the left.  Small amount of migration of blood dependent within the occipital horns of the lateral ventricles and in the fourth ventricle.   Patient was admitted to the hospital working diagnosis of hypertensive emergency complicated by acute subarachnoid hemorrhage.   Assessment & Plan:   Principal Problem:   Subarachnoid hemorrhage (HCC) Active Problems:   Diabetes mellitus ([place])   HTN (hypertension), benign   Peripheral vascular disease, unspecified ([place])   Hypertensive emergency   1. Subarachnoid hemorrhage. Patient only with mild neck pain, no nausea or vomiting. No headache. Will continue to follow neurosurgery recommendations (repeat angiography). Patient has been out of bed with physical therapy. Continue nimodipine, for cerebral vasodilation.    2, Hypertensive emergency. Blood pressure with better control, today 112/73, 113/76 and 128/47. Will continue amlodipine 10 mg, hydralazine 25 mg bid, isosorbide 20 mg bid, losartan 50 mg, metoprolol 12,5 mg bid. DC nicardipine drip.   3. Dyslipidemia. Continue atorvastatin.   4. T2DM. Continue insulin sliding scale for glucose cover and monitoring. Patient is tolerating  po well.    DVT prophylaxis: enoxaparin   Code Status:  full Family Communication: no family at the bedside  Disposition Plan/ discharge barriers: pending clinical improvement.   Body mass index is 25.98 kg/m. Malnutrition Type:      Malnutrition Characteristics:      Nutrition Interventions:     RN Pressure Injury Documentation:     Consultants:   Neuro surgery   Procedures:     Antimicrobials:       Subjective: Patient is feeling better, not yet back to baseline, continue to have mild to moderate neck pain, no headache, no nausea or vomiting, no chest pain or dyspnea.   Objective: Vitals:   12/02/18 0800 12/02/18 0939 12/02/18 1009 12/02/18 1100  BP: (!) 167/125 (!) 138/55 (!) 110/50 112/73  Pulse: 61  65 (!) 57  Resp: 18  18 20   Temp: 98.5 F (36.9 C)     TempSrc: Oral     SpO2: 96%  99% 99%  Weight:      Height:        Intake/Output Summary (Last 24 hours) at 12/02/2018 1132 Last data filed at 12/02/2018 1000 Gross per 24 hour  Intake 600 ml  Output 800 ml  Net -200 ml   Filed Weights   11/29/18 1429 11/30/18 0000 11/30/18 0103  Weight: 81.2 kg 79.8 kg 79.8 kg    Examination:   General: Not in pain or dyspnea, deconditioned  Neurology: Awake and alert, non focal  E ENT: mild pallor, no icterus, oral mucosa moist Cardiovascular: No JVD. S1-S2 present, rhythmic, no gallops, rubs, or murmurs. No lower extremity edema. Pulmonary: positive breath sounds bilaterally, adequate air movement, no wheezing, rhonchi or rales. Gastrointestinal. Abdomen with no organomegaly, non  tender, no rebound or guarding Skin. No rashes Musculoskeletal: no joint deformities     Data Reviewed: I have personally reviewed following labs and imaging studies  CBC: Recent Labs  Lab 11/29/18 1630  WBC 5.9  NEUTROABS 3.8  HGB 10.4*  HCT 32.4*  MCV 81.2  PLT 891   Basic Metabolic Panel: Recent Labs  Lab 11/29/18 1630 11/30/18 0429  NA 140 139  K 4.4  3.7  CL 110 109  CO2 22 20*  GLUCOSE 145* 129*  BUN 15 13  CREATININE 1.01* 1.00  CALCIUM 8.9 9.2  MG  --  1.8  PHOS  --  3.5   GFR: Estimated Creatinine Clearance: 49.9 mL/min (by C-G formula based on SCr of 1 mg/dL). Liver Function Tests: Recent Labs  Lab 11/30/18 0429  ALBUMIN 3.7   No results for input(s): LIPASE, AMYLASE in the last 168 hours. No results for input(s): AMMONIA in the last 168 hours. Coagulation Profile: No results for input(s): INR, PROTIME in the last 168 hours. Cardiac Enzymes: No results for input(s): CKTOTAL, CKMB, CKMBINDEX, TROPONINI in the last 168 hours. BNP (last 3 results) No results for input(s): PROBNP in the last 8760 hours. HbA1C: No results for input(s): HGBA1C in the last 72 hours. CBG: Recent Labs  Lab 12/01/18 2120 12/02/18 0000 12/02/18 0405 12/02/18 0718 12/02/18 1126  GLUCAP 190* 76 107* 118* 202*   Lipid Profile: No results for input(s): CHOL, HDL, LDLCALC, TRIG, CHOLHDL, LDLDIRECT in the last 72 hours. Thyroid Function Tests: No results for input(s): TSH, T4TOTAL, FREET4, T3FREE, THYROIDAB in the last 72 hours. Anemia Panel: No results for input(s): VITAMINB12, FOLATE, FERRITIN, TIBC, IRON, RETICCTPCT in the last 72 hours.    Radiology Studies: I have reviewed all of the imaging during this hospital visit personally     Scheduled Meds: . amLODipine  10 mg Oral Daily  . atorvastatin  20 mg Oral q1800  . hydrALAZINE  25 mg Oral BID  . insulin aspart  0-9 Units Subcutaneous Q4H  . isosorbide dinitrate  20 mg Oral BID  . losartan  50 mg Oral Daily  . metoprolol tartrate  12.5 mg Oral BID  . montelukast  10 mg Oral QHS  . niMODipine  60 mg Oral Q4H   Or  . niMODipine  60 mg Per Tube Q4H   Continuous Infusions: . niCARDipine Stopped (11/30/18 0838)     LOS: 3 days        Bernard Slayden Gerome Apley, MD

## 2018-12-02 NOTE — Progress Notes (Addendum)
Patient ID: [name] [name], female   DOB: [birth year], 84 y.o.   MRN: [id number] Patient is out of the room ambulating with physical therapy reportedly doing very well  Afebrile mildly hypertensive   we will discuss transfer to progressive with Dr. Kathyrn Sheriff,  repeat angios scheduled for later this week.

## 2018-12-03 DIAGNOSIS — E1159 Type 2 diabetes mellitus with other circulatory complications: Secondary | ICD-10-CM

## 2018-12-03 LAB — BASIC METABOLIC PANEL
Anion gap: 10 (ref 5–15)
BUN: 31 mg/dL — ABNORMAL HIGH (ref 8–23)
CO2: 19 mmol/L — ABNORMAL LOW (ref 22–32)
Calcium: 8.7 mg/dL — ABNORMAL LOW (ref 8.9–10.3)
Chloride: 112 mmol/L — ABNORMAL HIGH (ref 98–111)
Creatinine, Ser: 1.3 mg/dL — ABNORMAL HIGH (ref 0.44–1.00)
GFR calc Af Amer: 45 mL/min — ABNORMAL LOW (ref >60)
GFR calc non Af Amer: 38 mL/min — ABNORMAL LOW (ref >60)
Glucose, Bld: 109 mg/dL — ABNORMAL HIGH (ref 70–99)
Potassium: 4 mmol/L (ref 3.5–5.1)
Sodium: 141 mmol/L (ref 135–145)

## 2018-12-03 LAB — GLUCOSE, CAPILLARY
Glucose-Capillary: 114 mg/dL — ABNORMAL HIGH (ref 70–99)
Glucose-Capillary: 121 mg/dL — ABNORMAL HIGH (ref 70–99)
Glucose-Capillary: 218 mg/dL — ABNORMAL HIGH (ref 70–99)
Glucose-Capillary: 129 mg/dL — ABNORMAL HIGH (ref 70–99)
Glucose-Capillary: 131 mg/dL — ABNORMAL HIGH (ref 70–99)
Glucose-Capillary: 159 mg/dL — ABNORMAL HIGH (ref 70–99)

## 2018-12-03 MED ORDER — WHITE PETROLATUM EX OINT
TOPICAL_OINTMENT | CUTANEOUS | Status: AC
  Administered 2018-12-03: 15:00:00
  Filled 2018-12-03: qty 28.35

## 2018-12-03 NOTE — Progress Notes (Signed)
PROGRESS NOTE    [name] [name]  BJY:[id number] DOB: 02-15-1935 DOA: 11/29/2018 PCP: Willey Blade, MD    Brief Narrative:  84 year old female who presented with hypertensive emergency and subarachnoid hemorrhage.  She does have history of COPD, lung cancer, type 2 diabetes mellitus and hypertension.  She reported acute onset of headache and neck pain.  On her initial physical examination her blood pressure was 185/80, pulse rate 80, temperature 98.5, temperature 98.5, respiratory rate 19, oxygen saturation 100%.  Her neck was supple, her lungs are clear to auscultation bilaterally, heart S1-S2 present rhythmic, the abdomen was soft no lower extremity edema, neurologically patient was nonfocal.  Head CT showed subarachnoid hemorrhage at the craniocervical junction on the left.  Small amount of migration of blood dependent within the occipital horns of the lateral ventricles and in the fourth ventricle.   Patient was admitted to the hospital working diagnosis of hypertensive emergency complicated by acute subarachnoid hemorrhage.    Assessment & Plan:   Principal Problem:   Subarachnoid hemorrhage (HCC) Active Problems:   Diabetes mellitus ([place])   HTN (hypertension), benign   Peripheral vascular disease, unspecified ([place])   Hypertensive emergency   1. Subarachnoid hemorrhage. Patient has remained neurologically stable, will continue to follow neurosurgery recommendations (repeat angiography on this admission). On nimodipine, for cerebral vasodilation, per protocol.    2, Hypertensive emergency. On amlodipine 10 mg, hydralazine 25 mg bid, isosorbide 20 mg bid, losartan 50 mg, metoprolol 12,5 mg bid, for blood pressure control, 129/56 this am.   3. Dyslipidemia. On atorvastatin.   4. T2DM. Fasting glucose this am 109 mg/dl. On insulin sliding scale for glucose cover and monitoring.    DVT prophylaxis: scd  Code Status:  full Family Communication: no family at the bedside    Body mass index is 27.87 kg/m. Malnutrition Type:      Malnutrition Characteristics:      Nutrition Interventions:     RN Pressure Injury Documentation:     Consultants:   Neurosurgery   Procedures:   Cerebral angiography   Antimicrobials:       Subjective: Patient is feeling well, mild neck pain, no nausea or vomiting, no chest pain or dyspnea.   Objective: Vitals:   12/03/18 1000 12/03/18 1100 12/03/18 1200 12/03/18 1300  BP: (!) 166/56 (!) 118/51 (!) 129/56 (!) 118/101  Pulse: (!) 52 (!) 52 (!) 50 (!) 53  Resp: 12 14 17 14   Temp:   98.2 F (36.8 C)   TempSrc:   Oral   SpO2: 99% 94% 97% 98%  Weight:      Height:        Intake/Output Summary (Last 24 hours) at 12/03/2018 1408 Last data filed at 12/03/2018 1000 Gross per 24 hour  Intake 940 ml  Output -  Net 940 ml   Filed Weights   11/30/18 0000 11/30/18 0103 12/03/18 0446  Weight: 79.8 kg 79.8 kg 85.6 kg    Examination:   General: Not in pain or dyspnea  Neurology: Awake and alert, non focal  E ENT: no pallor, no icterus, oral mucosa moist Cardiovascular: No JVD. S1-S2 present, rhythmic, no gallops, rubs, or murmurs. No lower extremity edema. Pulmonary: vesicular breath sounds bilaterally, adequate air movement, no wheezing, rhonchi or rales. Gastrointestinal. Abdomen with, no organomegaly, non tender, no rebound or guarding Skin. No rashes Musculoskeletal: no joint deformities     Data Reviewed: I have personally reviewed following labs and imaging studies  CBC: Recent Labs  Lab 11/29/18  1630  WBC 5.9  NEUTROABS 3.8  HGB 10.4*  HCT 32.4*  MCV 81.2  PLT 892   Basic Metabolic Panel: Recent Labs  Lab 11/29/18 1630 11/30/18 0429 12/03/18 0443  NA 140 139 141  K 4.4 3.7 4.0  CL 110 109 112*  CO2 22 20* 19*  GLUCOSE 145* 129* 109*  BUN 15 13 31*  CREATININE 1.01* 1.00 1.30*  CALCIUM 8.9 9.2 8.7*  MG  --  1.8  --   PHOS  --  3.5  --    GFR: Estimated Creatinine  Clearance: 39.6 mL/min (A) (by C-G formula based on SCr of 1.3 mg/dL (H)). Liver Function Tests: Recent Labs  Lab 11/30/18 0429  ALBUMIN 3.7   No results for input(s): LIPASE, AMYLASE in the last 168 hours. No results for input(s): AMMONIA in the last 168 hours. Coagulation Profile: No results for input(s): INR, PROTIME in the last 168 hours. Cardiac Enzymes: No results for input(s): CKTOTAL, CKMB, CKMBINDEX, TROPONINI in the last 168 hours. BNP (last 3 results) No results for input(s): PROBNP in the last 8760 hours. HbA1C: No results for input(s): HGBA1C in the last 72 hours. CBG: Recent Labs  Lab 12/02/18 1944 12/02/18 2328 12/03/18 0329 12/03/18 0732 12/03/18 1133  GLUCAP 149* 106* 114* 121* 218*   Lipid Profile: No results for input(s): CHOL, HDL, LDLCALC, TRIG, CHOLHDL, LDLDIRECT in the last 72 hours. Thyroid Function Tests: No results for input(s): TSH, T4TOTAL, FREET4, T3FREE, THYROIDAB in the last 72 hours. Anemia Panel: No results for input(s): VITAMINB12, FOLATE, FERRITIN, TIBC, IRON, RETICCTPCT in the last 72 hours.    Radiology Studies: I have reviewed all of the imaging during this hospital visit personally     Scheduled Meds: . amLODipine  10 mg Oral Daily  . atorvastatin  20 mg Oral q1800  . hydrALAZINE  25 mg Oral BID  . insulin aspart  0-9 Units Subcutaneous Q4H  . isosorbide dinitrate  20 mg Oral BID  . losartan  50 mg Oral Daily  . metoprolol tartrate  12.5 mg Oral BID  . montelukast  10 mg Oral QHS  . niMODipine  60 mg Oral Q4H   Or  . niMODipine  60 mg Per Tube Q4H  . white petrolatum       Continuous Infusions:   LOS: 4 days        Kashtyn Jankowski Gerome Apley, MD

## 2018-12-04 ENCOUNTER — Inpatient Hospital Stay (HOSPITAL_COMMUNITY): Payer: Medicare HMO

## 2018-12-04 ENCOUNTER — Inpatient Hospital Stay: Payer: Medicare HMO

## 2018-12-04 ENCOUNTER — Ambulatory Visit (HOSPITAL_COMMUNITY): Admission: RE | Admit: 2018-12-04 | Payer: Medicare Other | Source: Ambulatory Visit

## 2018-12-04 DIAGNOSIS — I609 Nontraumatic subarachnoid hemorrhage, unspecified: Secondary | ICD-10-CM

## 2018-12-04 LAB — GLUCOSE, CAPILLARY
Glucose-Capillary: 85 mg/dL (ref 70–99)
Glucose-Capillary: 108 mg/dL — ABNORMAL HIGH (ref 70–99)
Glucose-Capillary: 143 mg/dL — ABNORMAL HIGH (ref 70–99)
Glucose-Capillary: 96 mg/dL (ref 70–99)
Glucose-Capillary: 145 mg/dL — ABNORMAL HIGH (ref 70–99)

## 2018-12-04 NOTE — Progress Notes (Signed)
Occupational Therapy Treatment Patient Details Name: [name] [name] MRN: [id number] DOB: 01-02-1935 Today's Date: 12/04/2018    History of present illness 84 year old with history of COPD, lung cancer, diabetes, hypertension, hx of L upper lobectomy 2018.  Admitted with sudden onset headache, neck pain.  Found to have a subarachnoid hemorrhage with hypertensive emergency. Cerebral angiogram completed 4/30 with findings normal, no aneurysm, AVM, or fistula identified.   OT comments  Pt presents supine in bed agreeable to therapy session. Limited session today as pt SBP >140 with minimal activity. After transition to sitting EOB BP initially 162/55, after approx 3-5 min in sitting BP 152/58, returned to supine and BP initially 172/55, 158/61 end of session. Pt performing bed mobility overall at supervision level. Will continue to follow and progress pt towards established OT goals. Pt reports has been up in chair most of the day and up earlier working with PT. Will continue to follow acutely.    Follow Up Recommendations  Home health OT;Supervision/Assistance - 24 hour    Equipment Recommendations  Tub/shower seat          Precautions / Restrictions Precautions Precautions: Fall;Other (comment) Precaution Comments: SBP<140 Restrictions Weight Bearing Restrictions: No       Mobility Bed Mobility Overal bed mobility: Needs Assistance Bed Mobility: Supine to Sit;Sit to Supine     Supine to sit: Supervision Sit to supine: Supervision   General bed mobility comments: for general safety, no physical assist required  Transfers                 General transfer comment: deferred due to elevated SBP (>140)    Balance Overall balance assessment: Needs assistance Sitting-balance support: Feet supported;No upper extremity supported Sitting balance-Leahy Scale: Good                                     ADL either performed or assessed with clinical judgement    ADL Overall ADL's : Needs assistance/impaired                                     Functional mobility during ADLs: Min guard General ADL Comments: limited session as pt with elevated BP today (SBP>140)     Vision       Perception     Praxis      Cognition Arousal/Alertness: Awake/alert Behavior During Therapy: WFL for tasks assessed/performed Overall Cognitive Status: Within Functional Limits for tasks assessed                                          Exercises     Shoulder Instructions       General Comments      Pertinent Vitals/ Pain       Pain Assessment: Faces Faces Pain Scale: Hurts even more Pain Location: headache, R knee Pain Descriptors / Indicators: Headache;Discomfort;Sore Pain Intervention(s): Monitored during session;Premedicated before session;Heat applied(heat applied prior to start of session)  Home Living                                          Prior Functioning/Environment  Frequency  Min 2X/week        Progress Toward Goals  OT Goals(current goals can now be found in the care plan section)  Progress towards OT goals: OT to reassess next treatment  Acute Rehab OT Goals Patient Stated Goal: get back to shopping OT Goal Formulation: With patient Time For Goal Achievement: 12/15/18 Potential to Achieve Goals: Good  Plan Discharge plan remains appropriate    Co-evaluation                 AM-PAC OT "6 Clicks" Daily Activity     Outcome Measure   Help from another person eating meals?: None Help from another person taking care of personal grooming?: A Little Help from another person toileting, which includes using toliet, bedpan, or urinal?: A Little Help from another person bathing (including washing, rinsing, drying)?: A Little Help from another person to put on and taking off regular upper body clothing?: None Help from another person to put on and  taking off regular lower body clothing?: A Lot 6 Click Score: 19    End of Session    OT Visit Diagnosis: Unsteadiness on feet (R26.81);Muscle weakness (generalized) (M62.81)   Activity Tolerance Patient tolerated treatment well;Treatment limited secondary to medical complications (Comment)(elevated BP)   Patient Left in bed;with call bell/phone within reach   Nurse Communication Mobility status        Time: 3474-2595 OT Time Calculation (min): 22 min  Charges: OT General Charges $OT Visit: 1 Visit OT Treatments $Self Care/Home Management : 8-22 mins  Lou Cal, [place] Pager [phone] Office [place] 12/04/2018, 4:28 PM

## 2018-12-04 NOTE — Progress Notes (Signed)
  NEUROSURGERY PROGRESS NOTE   No issues overnight.  Complains of mild frontal headache neck pain/stiffness. + photophobia. No changes in vision.  EXAM:  BP (!) 150/64 (BP Location: Left Arm)   Pulse (!) 56   Temp 98.3 F (36.8 C) (Oral)   Resp 18   Ht 5\' 9"  (1.753 m)   Wt 86.9 kg   SpO2 99%   BMI 28.29 kg/m   Awake, alert, oriented  Speech fluent, appropriate  CN grossly intact  5/5 BUE/BLE   IMPRESSION/PLAN 84 y.o. female [facility] d#6, initial angio negative. Stable neurologically. - continue supportive care, nimotop - PT/OT - SBP goal <140 - Repeat angio later this week. Will follow up when scheduled.  Ferne Reus, PA-C [facility] [employer]

## 2018-12-04 NOTE — TOC Initial Note (Signed)
Transition of Care [facility]) - Initial/Assessment Note    Patient Details  Name: [name] [name] MRN: [id number] Date of Birth: Dec 28, 1934  Transition of Care [facility]) CM/SW Contact:    Ella Bodo, RN Phone Number: 12/04/2018, 5:01 PM  Clinical Narrative:84 year old with history of COPD, lung cancer, diabetes, hypertension, hx of L upper lobectomy 2018.  Admitted with sudden onset headache, neck pain.  Found to have a subarachnoid hemorrhage with hypertensive emergency. Cerebral angiogram completed 4/30 with findings normal, no aneurysm, AVM, or fistula identified. PTA, pt independent with assistive devices; lives with adult son.  PT/OT recommending HH follow up and tub/shower seat for home.    MD: please leave orders for HHPT/OT and tub shower seat with face to face documentation for Medicare prior to dc home.                Expected Discharge Plan: [place] Barriers to Discharge: Continued Medical Work up          Expected Discharge Plan and Services Expected Discharge Plan: [place]   Discharge Planning Services: CM Consult   Living arrangements for the past 2 months: Single Family Home                                      Prior Living Arrangements/Services Living arrangements for the past 2 months: Single Family Home Lives with:: Adult Children   Do you feel safe going back to the place where you live?: Yes               Activities of Daily Living   ADL Screening (condition at time of admission) Patient's cognitive ability adequate to safely complete daily activities?: Yes Is the patient deaf or have difficulty hearing?: No Does the patient have difficulty seeing, even when wearing glasses/contacts?: No Does the patient have difficulty concentrating, remembering, or making decisions?: No Patient able to express need for assistance with ADLs?: Yes Does the patient have difficulty dressing or bathing?: Yes Independently  performs ADLs?: Yes (appropriate for developmental age) Does the patient have difficulty walking or climbing stairs?: Yes Weakness of Legs: Both Weakness of Arms/Hands: None                   Emotional Assessment Appearance:: Appears stated age   Affect (typically observed): Accepting, Appropriate Orientation: : Oriented to Self, Oriented to Place, Oriented to  Time, Oriented to Situation Alcohol / Substance Use: Not Applicable Psych Involvement: No (comment)  Admission diagnosis:  Subarachnoid hemorrhage (HCC) [I60.9] SAH (subarachnoid hemorrhage) ([place]) [I60.9] Patient Active Problem List   Diagnosis Date Noted  . Hypertensive emergency 12/02/2018  . Subarachnoid hemorrhage ([place]) 11/29/2018  . S/P lobectomy of lung 04/21/2017  . 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 Solitary pulmonary nodule 02/08/2017  . Shortness of breath 12/29/2016    Class: Acute  . Peripheral vascular disease, unspecified ([place]) 07/12/2013  . Atherosclerosis of native arteries of the extremities with intermittent claudication 07/12/2013  . Chest pain, cardiac 06/08/2011    Class: Acute  . Diabetes mellitus ([place]) 06/08/2011    Class: History of  . HTN (hypertension), benign 06/08/2011    Class: Chronic  . Obesity (BMI 30-39.9) 06/08/2011    Class: History of  . Coronary artery disease 06/08/2011    Class: History of   PCP:  Willey Blade,  MD Pharmacy:   [facility], [place] [address] Phone: [phone] Fax: [facility], [place] [address] Phone: [phone] Fax: [phone]  [facility], [place] [address] [place] [address] Phone: [phone] Fax: [phone]        Readmission Risk Interventions No  flowsheet data found.  Reinaldo Raddle, RN, BSN  Trauma/Neuro ICU Case Manager [phone]

## 2018-12-04 NOTE — Progress Notes (Signed)
Physical Therapy Treatment Patient Details Name: [name] [name] MRN: [id number] DOB: 08-23-1934 Today's Date: 12/04/2018    History of Present Illness 84 year old with history of COPD, lung cancer, diabetes, hypertension, hx of L upper lobectomy 2018.  Admitted with sudden onset headache, neck pain.  Found to have a subarachnoid hemorrhage with hypertensive emergency. Cerebral angiogram completed 4/30 with findings normal, no aneurysm, AVM, or fistula identified.    PT Comments    Patient progressing well towards PT goals. Reports headache and neck pain today which is improved after taking pain meds. Tolerated gait training with Min guard for balance/safety and cues for upright posture. Pt with very slow gait speed indicating increased risk for falls. Encouraged ambulation a few times per day to improve overall mobility and strength. BP stable throughout with SBP <140. Will continue to follow.    Follow Up Recommendations  Home health PT;Supervision for mobility/OOB     Equipment Recommendations  None recommended by PT    Recommendations for Other Services       Precautions / Restrictions Precautions Precautions: Fall;Other (comment) Precaution Comments: SBP<140 Restrictions Weight Bearing Restrictions: No    Mobility  Bed Mobility               General bed mobility comments: Sitting in chair upon PT arrival.   Transfers Overall transfer level: Needs assistance Equipment used: Rolling walker (2 wheeled) Transfers: Sit to/from Stand Sit to Stand: Min guard         General transfer comment: guarding for safety and lines; use of momentum to stand from chair.  Ambulation/Gait Ambulation/Gait assistance: Min guard Gait Distance (Feet): 200 Feet Assistive device: Rolling walker (2 wheeled) Gait Pattern/deviations: Step-through pattern;Decreased stride length;Trunk flexed Gait velocity: .61 ft/sec Gait velocity interpretation: <1.31 ft/sec, indicative of household  ambulator General Gait Details: Slow, steady gait with cues for posture and position in RW.   Stairs             Wheelchair Mobility    Modified Rankin (Stroke Patients Only) Modified Rankin (Stroke Patients Only) Pre-Morbid Rankin Score: No significant disability Modified Rankin: Moderate disability     Balance Overall balance assessment: Needs assistance Sitting-balance support: Feet supported;No upper extremity supported Sitting balance-Leahy Scale: Good     Standing balance support: Bilateral upper extremity supported;During functional activity Standing balance-Leahy Scale: Fair Standing balance comment: able to maintain static standing with close minguard assist; reliant on UE support during mobility tasks                            Cognition Arousal/Alertness: Awake/alert Behavior During Therapy: WFL for tasks assessed/performed Overall Cognitive Status: Within Functional Limits for tasks assessed                                        Exercises      General Comments General comments (skin integrity, edema, etc.): Bp pre activity 127/47, post activity 133/48      Pertinent Vitals/Pain Pain Assessment: 0-10 Pain Score: 4  Pain Location: headache Pain Descriptors / Indicators: Headache Pain Intervention(s): Monitored during session;Repositioned;Premedicated before session    Home Living                      Prior Function            PT Goals (current goals can now  be found in the care plan section) Progress towards PT goals: Progressing toward goals    Frequency    Min 4X/week      PT Plan Current plan remains appropriate    Co-evaluation              AM-PAC PT "6 Clicks" Mobility   Outcome Measure  Help needed turning from your back to your side while in a flat bed without using bedrails?: None Help needed moving from lying on your back to sitting on the side of a flat bed without using  bedrails?: None Help needed moving to and from a bed to a chair (including a wheelchair)?: A Little Help needed standing up from a chair using your arms (e.g., wheelchair or bedside chair)?: A Little Help needed to walk in hospital room?: A Little Help needed climbing 3-5 steps with a railing? : A Little 6 Click Score: 20    End of Session Equipment Utilized During Treatment: Gait belt Activity Tolerance: Patient tolerated treatment well Patient left: in chair;with call bell/phone within reach Nurse Communication: Mobility status PT Visit Diagnosis: Unsteadiness on feet (R26.81);Difficulty in walking, not elsewhere classified (R26.2)     Time: 1019-1040 PT Time Calculation (min) (ACUTE ONLY): 21 min  Charges:  $Gait Training: 8-22 mins                     Wray Kearns, PT, DPT Acute Rehabilitation Services Pager [phone] Office [place] 12/04/2018, 12:22 PM

## 2018-12-04 NOTE — Progress Notes (Addendum)
PROGRESS NOTE    [name] [name]  JSE:[id number] DOB: 12/02/1934 DOA: 11/29/2018 PCP: Willey Blade, MD    Brief Narrative:  84 year old female who presented with hypertensive emergency and subarachnoid hemorrhage. She does have history of COPD, lung cancer, type 2 diabetes mellitus and hypertension. She reported acute onset of headache and neck pain. On her initial physical examination her blood pressure was 185/80, pulse rate 80, temperature 98.5, temperature 98.5, respiratory rate 19, oxygen saturation 100%. Her neck was supple, her lungs are clear to auscultation bilaterally, heart S1-S2 present rhythmic, the abdomen was soft no lower extremity edema, neurologically patient was nonfocal. Head CT showed subarachnoid hemorrhage at the craniocervical junction on the left. Small amount of migration of blood dependent within the occipital horns of the lateral ventricles and in the fourth ventricle.  Patient was admitted to the hospital working diagnosis of hypertensive emergency complicated by acute subarachnoid hemorrhage.   Assessment & Plan:   Principal Problem:   Subarachnoid hemorrhage (HCC) Active Problems:   Diabetes mellitus ([place])   HTN (hypertension), benign   Peripheral vascular disease, unspecified ([place])   Hypertensive emergency   1. Subarachnoid hemorrhage. Continue neurologically stable, plan for repeat angiography this week. Continue with nimodipine, for cerebral vasodilation.   2, Hypertensive emergency. Blood pressure has remained stable will continue with amlodipine 10 mg, hydralazine 25 mg bid, isosorbide 20 mg bid, losartan 50 mg, metoprolol 12,5 mg bid. Systolic blood pressure 073 to 149 mmHg.    3. Dyslipidemia. Continue atorvastatin.   4. T2DM. Patient is tolerating po well, continue glucose cover and monitoring with insulin sliding scale.   5. AKI. Serum cr up to 1,30 yesterday from 1,0 on 4/30. Patient is tolerating po well, will follow on renal  panel in am.    DVT prophylaxis:scd Code Status:full Family Communication:no family at the bedside  Body mass index is 28.29 kg/m. Malnutrition Type:      Malnutrition Characteristics:      Nutrition Interventions:     RN Pressure Injury Documentation:     Consultants:   Neurosurgery   Procedures:   cerebral angiogram.   Antimicrobials:      Subjective: Patient is feeling better, she is out of bed, her headache continue to improve.   Objective: Vitals:   12/04/18 0759 12/04/18 0928 12/04/18 1040 12/04/18 1242  BP: (!) 150/64 (!) 173/51 (!) 133/48 (!) 149/52  Pulse: (!) 56 (!) 52 (!) 58 (!) 58  Resp: 18 16  13   Temp: 98.3 F (36.8 C)   98.2 F (36.8 C)  TempSrc: Oral   Axillary  SpO2: 99% 100%  98%  Weight:      Height:        Intake/Output Summary (Last 24 hours) at 12/04/2018 1245 Last data filed at 12/04/2018 0900 Gross per 24 hour  Intake 620 ml  Output 900 ml  Net -280 ml   Filed Weights   11/30/18 0103 12/03/18 0446 12/04/18 0500  Weight: 79.8 kg 85.6 kg 86.9 kg    Examination:   General: Not in pain or dyspnea Neurology: Awake and alert, non focal  E ENT: no pallor, no icterus, oral mucosa moist Cardiovascular: No JVD. S1-S2 present, rhythmic, no gallops, rubs, or murmurs. No lower extremity edema. Pulmonary: positive breath sounds bilaterally, adequate air movement, no wheezing, rhonchi or rales. Gastrointestinal. Abdomen with no organomegaly, non tender, no rebound or guarding Skin. No rashes Musculoskeletal: no joint deformities     Data Reviewed: I have personally reviewed following labs  and imaging studies  CBC: Recent Labs  Lab 11/29/18 1630  WBC 5.9  NEUTROABS 3.8  HGB 10.4*  HCT 32.4*  MCV 81.2  PLT 557   Basic Metabolic Panel: Recent Labs  Lab 11/29/18 1630 11/30/18 0429 12/03/18 0443  NA 140 139 141  K 4.4 3.7 4.0  CL 110 109 112*  CO2 22 20* 19*  GLUCOSE 145* 129* 109*  BUN 15 13 31*   CREATININE 1.01* 1.00 1.30*  CALCIUM 8.9 9.2 8.7*  MG  --  1.8  --   PHOS  --  3.5  --    GFR: Estimated Creatinine Clearance: 39.9 mL/min (A) (by C-G formula based on SCr of 1.3 mg/dL (H)). Liver Function Tests: Recent Labs  Lab 11/30/18 0429  ALBUMIN 3.7   No results for input(s): LIPASE, AMYLASE in the last 168 hours. No results for input(s): AMMONIA in the last 168 hours. Coagulation Profile: No results for input(s): INR, PROTIME in the last 168 hours. Cardiac Enzymes: No results for input(s): CKTOTAL, CKMB, CKMBINDEX, TROPONINI in the last 168 hours. BNP (last 3 results) No results for input(s): PROBNP in the last 8760 hours. HbA1C: No results for input(s): HGBA1C in the last 72 hours. CBG: Recent Labs  Lab 12/03/18 1935 12/03/18 2325 12/04/18 0500 12/04/18 0822 12/04/18 1214  GLUCAP 131* 159* 85 108* 143*   Lipid Profile: No results for input(s): CHOL, HDL, LDLCALC, TRIG, CHOLHDL, LDLDIRECT in the last 72 hours. Thyroid Function Tests: No results for input(s): TSH, T4TOTAL, FREET4, T3FREE, THYROIDAB in the last 72 hours. Anemia Panel: No results for input(s): VITAMINB12, FOLATE, FERRITIN, TIBC, IRON, RETICCTPCT in the last 72 hours.    Radiology Studies: I have reviewed all of the imaging during this hospital visit personally     Scheduled Meds: . amLODipine  10 mg Oral Daily  . atorvastatin  20 mg Oral q1800  . hydrALAZINE  25 mg Oral BID  . insulin aspart  0-9 Units Subcutaneous Q4H  . isosorbide dinitrate  20 mg Oral BID  . losartan  50 mg Oral Daily  . metoprolol tartrate  12.5 mg Oral BID  . montelukast  10 mg Oral QHS  . niMODipine  60 mg Oral Q4H   Or  . niMODipine  60 mg Per Tube Q4H   Continuous Infusions:   LOS: 5 days        [place] Gerome Apley, MD

## 2018-12-04 NOTE — Progress Notes (Signed)
Transcranial Doppler  Date POD PCO2 HCT BP  MCA ACA PCA OPHT SIPH VERT Basilar       Right  Left                                            Right  Left                                       12/04/18     Right  Left   28  41   -37  *   15  26   18  11    *  *   *  *   *  *          Right  Left                                             Right  Left                                            Right  Left                                            Right  Left                                        MCA = Middle Cerebral Artery      OPHT = Opthalmic Artery     BASILAR = Basilar Artery   ACA = Anterior Cerebral Artery     SIPH = Carotid Siphon PCA = Posterior Cerebral Artery   VERT = Verterbral Artery                 *-unable to insonate  Normal MCA = 62+\-12 ACA = 50+\-12 PCA = 42+\-23   [name] [name] 12/04/2018 1:11 PM

## 2018-12-05 LAB — BASIC METABOLIC PANEL
Anion gap: 10 (ref 5–15)
BUN: 24 mg/dL — ABNORMAL HIGH (ref 8–23)
CO2: 17 mmol/L — ABNORMAL LOW (ref 22–32)
Calcium: 8.6 mg/dL — ABNORMAL LOW (ref 8.9–10.3)
Chloride: 111 mmol/L (ref 98–111)
Creatinine, Ser: 1.02 mg/dL — ABNORMAL HIGH (ref 0.44–1.00)
GFR calc Af Amer: 60 mL/min — ABNORMAL LOW (ref >60)
GFR calc non Af Amer: 52 mL/min — ABNORMAL LOW (ref >60)
Glucose, Bld: 145 mg/dL — ABNORMAL HIGH (ref 70–99)
Potassium: 4.6 mmol/L (ref 3.5–5.1)
Sodium: 138 mmol/L (ref 135–145)

## 2018-12-05 LAB — GLUCOSE, CAPILLARY
Glucose-Capillary: 137 mg/dL — ABNORMAL HIGH (ref 70–99)
Glucose-Capillary: 151 mg/dL — ABNORMAL HIGH (ref 70–99)
Glucose-Capillary: 157 mg/dL — ABNORMAL HIGH (ref 70–99)
Glucose-Capillary: 207 mg/dL — ABNORMAL HIGH (ref 70–99)
Glucose-Capillary: 222 mg/dL — ABNORMAL HIGH (ref 70–99)
Glucose-Capillary: 223 mg/dL — ABNORMAL HIGH (ref 70–99)

## 2018-12-05 MED ORDER — DEXAMETHASONE SODIUM PHOSPHATE 10 MG/ML IJ SOLN
10.0000 mg | Freq: Three times a day (TID) | INTRAMUSCULAR | Status: DC
  Administered 2018-12-05 – 2018-12-06 (×4): 10 mg via INTRAVENOUS
  Filled 2018-12-05 (×5): qty 1

## 2018-12-05 MED ORDER — GABAPENTIN 600 MG PO TABS
300.0000 mg | ORAL_TABLET | Freq: Three times a day (TID) | ORAL | Status: DC
  Administered 2018-12-05 – 2018-12-07 (×9): 300 mg via ORAL
  Filled 2018-12-05 (×9): qty 1

## 2018-12-05 NOTE — Progress Notes (Signed)
Pt made RN aware of increased pain in right leg. RN called MD to notify of pain in right leg. MD gave new orders for Neurontin and Decadron per MAR. Will cont. To follow.

## 2018-12-05 NOTE — Progress Notes (Signed)
  NEUROSURGERY PROGRESS NOTE   RLE pain starting in hip posteriorly to calf overnight. Given decadron and Neurontin with relief. Complains of mild headache and left sided neck pain.  EXAM:  BP (!) 153/57 (BP Location: Left Arm)   Pulse 60   Temp 98.4 F (36.9 C) (Oral)   Resp 15   Ht 5\' 9"  (1.753 m)   Wt 84.8 kg   SpO2 93%   BMI 27.61 kg/m   Awake, alert, oriented  Speech fluent, appropriate  CN grossly intact  5/5 BUE/BLE   IMPRESSION/PLAN 83 y.o.femaleSAH d#7, initial angio negative. Stable neurologically. Mild AKI noted on labs yesterday. - continue supportive care, nimotop - PT/OT - SBP goal <140 - Repeat angio tomorrow/Thursday. Will follow up when scheduled. - Trend Cr.   Ferne Reus, PA-C [facility] [employer]

## 2018-12-05 NOTE — Progress Notes (Signed)
Physical Therapy Treatment Patient Details Name: [name] [name] MRN: [id number] DOB: [birth year] Today's Date: 12/05/2018    History of Present Illness 84 year old with history of COPD, lung cancer, diabetes, hypertension, hx of L upper lobectomy 2018.  Admitted with sudden onset headache, neck pain.  Found to have a subarachnoid hemorrhage with hypertensive emergency. Cerebral angiogram completed 4/30 with findings normal, no aneurysm, AVM, or fistula identified.    PT Comments    Patient progressing well towards PT goals. Improved ambulation distance. Continues to have slow gait speed and requires cues for RW management. Reports new pains in right hip and knee last night, improved with pain meds. Continues to have BP managed, 139/43 pre session today. RN notified. Encouraged walking multiple times per day. Will follow.    Follow Up Recommendations  Home health PT;Supervision for mobility/OOB     Equipment Recommendations  None recommended by PT    Recommendations for Other Services       Precautions / Restrictions Precautions Precautions: Fall;Other (comment) Precaution Comments: SBP<140 Restrictions Weight Bearing Restrictions: No    Mobility  Bed Mobility               General bed mobility comments: Up in chair upon PT arrival.   Transfers Overall transfer level: Needs assistance Equipment used: Rolling walker (2 wheeled) Transfers: Sit to/from Stand Sit to Stand: Min guard         General transfer comment: Min guard for safety, multiple attempts needed to stand due to low surface, use of momentum.  Ambulation/Gait Ambulation/Gait assistance: Min guard Gait Distance (Feet): 225 Feet Assistive device: Rolling walker (2 wheeled) Gait Pattern/deviations: Step-through pattern;Decreased stride length;Trunk flexed     General Gait Details: Slow, steady gait with cues for posture and position in RW.   Stairs             Wheelchair Mobility     Modified Rankin (Stroke Patients Only) Modified Rankin (Stroke Patients Only) Pre-Morbid Rankin Score: No significant disability Modified Rankin: Moderate disability     Balance Overall balance assessment: Needs assistance Sitting-balance support: Feet supported;No upper extremity supported Sitting balance-Leahy Scale: Good     Standing balance support: Bilateral upper extremity supported;During functional activity Standing balance-Leahy Scale: Fair Standing balance comment: able to maintain static standing with close minguard assist; reliant on UE support during mobility tasks                            Cognition Arousal/Alertness: Awake/alert Behavior During Therapy: WFL for tasks assessed/performed Overall Cognitive Status: Within Functional Limits for tasks assessed                                        Exercises      General Comments General comments (skin integrity, edema, etc.): Bp 139/43      Pertinent Vitals/Pain Pain Assessment: 0-10 Pain Score: 4  Pain Location: right hip Pain Descriptors / Indicators: Sore Pain Intervention(s): Monitored during session;Repositioned    Home Living                      Prior Function            PT Goals (current goals can now be found in the care plan section) Progress towards PT goals: Progressing toward goals    Frequency    Min 4X/week  PT Plan Current plan remains appropriate    Co-evaluation              AM-PAC PT "6 Clicks" Mobility   Outcome Measure  Help needed turning from your back to your side while in a flat bed without using bedrails?: None Help needed moving from lying on your back to sitting on the side of a flat bed without using bedrails?: None Help needed moving to and from a bed to a chair (including a wheelchair)?: A Little Help needed standing up from a chair using your arms (e.g., wheelchair or bedside chair)?: A Little Help needed to  walk in hospital room?: A Little Help needed climbing 3-5 steps with a railing? : A Little 6 Click Score: 20    End of Session Equipment Utilized During Treatment: Gait belt Activity Tolerance: Patient tolerated treatment well Patient left: in chair;with call bell/phone within reach Nurse Communication: Mobility status PT Visit Diagnosis: Unsteadiness on feet (R26.81);Difficulty in walking, not elsewhere classified (R26.2)     Time: 1137-1200 PT Time Calculation (min) (ACUTE ONLY): 23 min  Charges:  $Gait Training: 23-37 mins                     Wray Kearns, [place], DPT Acute Rehabilitation Services Pager [phone] Office [place] 12/05/2018, 12:57 PM

## 2018-12-05 NOTE — Plan of Care (Signed)
  Problem: Education: Goal: Knowledge of disease or condition will improve Outcome: Progressing Goal: Knowledge of secondary prevention will improve Outcome: Progressing Goal: Knowledge of patient specific risk factors addressed and post discharge goals established will improve Outcome: Progressing   Problem: Health Behavior/Discharge Planning: Goal: Ability to manage health-related needs will improve Outcome: Progressing   Problem: Self-Care: Goal: Ability to participate in self-care as condition permits will improve Outcome: Progressing   Problem: Spontaneous Subarachnoid Hemorrhage Tissue Perfusion: Goal: Complications of Spontaneous Subarachnoid Hemorrhage will be minimized Outcome: Progressing   Problem: Education: Goal: Knowledge of General Education information will improve Description Including pain rating scale, medication(s)/side effects and non-pharmacologic comfort measures Outcome: Progressing   Problem: Health Behavior/Discharge Planning: Goal: Ability to manage health-related needs will improve Outcome: Progressing   Problem: Clinical Measurements: Goal: Ability to maintain clinical measurements within normal limits will improve Outcome: Progressing Goal: Will remain free from infection Outcome: Progressing Goal: Diagnostic test results will improve Outcome: Progressing Goal: Respiratory complications will improve Outcome: Progressing Goal: Cardiovascular complication will be avoided Outcome: Progressing   Problem: Activity: Goal: Risk for activity intolerance will decrease Outcome: Progressing   Problem: Nutrition: Goal: Adequate nutrition will be maintained Outcome: Progressing   Problem: Coping: Goal: Level of anxiety will decrease Outcome: Progressing   Problem: Elimination: Goal: Will not experience complications related to bowel motility Outcome: Progressing Goal: Will not experience complications related to urinary retention Outcome:  Progressing   Problem: Pain Managment: Goal: General experience of comfort will improve Outcome: Progressing   Problem: Safety: Goal: Ability to remain free from injury will improve Outcome: Progressing   Problem: Skin Integrity: Goal: Risk for impaired skin integrity will decrease Outcome: Progressing

## 2018-12-06 ENCOUNTER — Inpatient Hospital Stay: Payer: Medicare HMO | Attending: Internal Medicine | Admitting: Internal Medicine

## 2018-12-06 ENCOUNTER — Inpatient Hospital Stay (HOSPITAL_COMMUNITY): Payer: Medicare HMO

## 2018-12-06 DIAGNOSIS — I609 Nontraumatic subarachnoid hemorrhage, unspecified: Secondary | ICD-10-CM

## 2018-12-06 LAB — BASIC METABOLIC PANEL
Anion gap: 9 (ref 5–15)
BUN: 33 mg/dL — ABNORMAL HIGH (ref 8–23)
CO2: 18 mmol/L — ABNORMAL LOW (ref 22–32)
Calcium: 8.7 mg/dL — ABNORMAL LOW (ref 8.9–10.3)
Chloride: 110 mmol/L (ref 98–111)
Creatinine, Ser: 1.1 mg/dL — ABNORMAL HIGH (ref 0.44–1.00)
GFR calc Af Amer: 55 mL/min — ABNORMAL LOW (ref >60)
GFR calc non Af Amer: 47 mL/min — ABNORMAL LOW (ref >60)
Glucose, Bld: 165 mg/dL — ABNORMAL HIGH (ref 70–99)
Potassium: 4.7 mmol/L (ref 3.5–5.1)
Sodium: 137 mmol/L (ref 135–145)

## 2018-12-06 LAB — GLUCOSE, CAPILLARY
Glucose-Capillary: 106 mg/dL — ABNORMAL HIGH (ref 70–99)
Glucose-Capillary: 121 mg/dL — ABNORMAL HIGH (ref 70–99)
Glucose-Capillary: 130 mg/dL — ABNORMAL HIGH (ref 70–99)
Glucose-Capillary: 168 mg/dL — ABNORMAL HIGH (ref 70–99)
Glucose-Capillary: 168 mg/dL — ABNORMAL HIGH (ref 70–99)
Glucose-Capillary: 173 mg/dL — ABNORMAL HIGH (ref 70–99)
Glucose-Capillary: 217 mg/dL — ABNORMAL HIGH (ref 70–99)

## 2018-12-06 MED ORDER — METHYLPREDNISOLONE 4 MG PO TBPK
4.0000 mg | ORAL_TABLET | ORAL | Status: AC
  Administered 2018-12-06: 4 mg via ORAL

## 2018-12-06 MED ORDER — METHYLPREDNISOLONE 4 MG PO TBPK
8.0000 mg | ORAL_TABLET | Freq: Every evening | ORAL | Status: AC
  Administered 2018-12-06: 21:00:00 8 mg via ORAL

## 2018-12-06 MED ORDER — METHYLPREDNISOLONE 4 MG PO TBPK
4.0000 mg | ORAL_TABLET | Freq: Three times a day (TID) | ORAL | Status: AC
  Administered 2018-12-07 (×3): 4 mg via ORAL

## 2018-12-06 MED ORDER — METHYLPREDNISOLONE 4 MG PO TBPK
4.0000 mg | ORAL_TABLET | Freq: Four times a day (QID) | ORAL | Status: DC
  Administered 2018-12-08 – 2018-12-10 (×8): 4 mg via ORAL

## 2018-12-06 MED ORDER — METHYLPREDNISOLONE 4 MG PO TBPK: 8.0000 mg | ORAL_TABLET | Freq: Every morning | ORAL | Status: DC

## 2018-12-06 MED ORDER — INSULIN ASPART 100 UNIT/ML ~~LOC~~ SOLN
3.0000 [IU] | Freq: Three times a day (TID) | SUBCUTANEOUS | Status: DC
  Administered 2018-12-06 – 2018-12-10 (×9): 3 [IU] via SUBCUTANEOUS

## 2018-12-06 MED ORDER — METHYLPREDNISOLONE 4 MG PO TBPK
12.0000 mg | ORAL_TABLET | ORAL | Status: AC
  Administered 2018-12-06: 12 mg via ORAL
  Filled 2018-12-06: qty 21

## 2018-12-06 MED ORDER — METHYLPREDNISOLONE 4 MG PO TBPK
8.0000 mg | ORAL_TABLET | Freq: Every evening | ORAL | Status: AC
  Administered 2018-12-07: 21:00:00 8 mg via ORAL

## 2018-12-06 NOTE — Progress Notes (Signed)
  NEUROSURGERY PROGRESS NOTE   No issues overnight.  No further episodes of RLE pain Headacge mild No concerns this am  EXAM:  BP (!) 137/55 (BP Location: Right Arm)   Pulse (!) 55   Temp 98.1 F (36.7 C) (Oral)   Resp 20   Ht 5\' 9"  (1.753 m)   Wt 86.3 kg   SpO2 96%   BMI 28.10 kg/m   Awake, alert, oriented  Speech fluent, appropriate  CN grossly intact  5/5 BUE/BLE   IMPRESSION/PLAN 83 y.o.femaleSAH d#7, initial angio negative. Stable neurologically. Mild AKI. - continue supportive care, nimotop - PT/OT - SBP goal <140 - Repeatangio Friday.  - AKI: Trend Cr.  - RLE pain: resolved. wean steroids. Adjusted insulin per DM coordinator  Ferne Reus, PA-C [facility] @[employer] @

## 2018-12-06 NOTE — Progress Notes (Signed)
Physical Therapy Treatment Patient Details Name: [name] [name] MRN: [id number] DOB: 12-17-1934 Today's Date: 12/06/2018    History of Present Illness 84 year old with history of COPD, lung cancer, diabetes, hypertension, hx of L upper lobectomy 2018.  Admitted with sudden onset headache, neck pain.  Found to have a subarachnoid hemorrhage with hypertensive emergency. Cerebral angiogram completed 4/30 with findings normal, no aneurysm, AVM, or fistula identified.    PT Comments    Pt performed gait training and functional mobility.  She continues to present with safety concerns but is progressing well.  Plan for higher level balance activities next session and retrial of stair training.  Continue to recommend HHPT at this time with support from her family.    Follow Up Recommendations  Home health PT;Supervision for mobility/OOB     Equipment Recommendations  None recommended by PT    Recommendations for Other Services       Precautions / Restrictions Precautions Precautions: Fall;Other (comment) Restrictions Weight Bearing Restrictions: No    Mobility  Bed Mobility Overal bed mobility: Needs Assistance Bed Mobility: Supine to Sit;Sit to Supine     Supine to sit: Supervision Sit to supine: Supervision   General bed mobility comments: for general safety  Transfers Overall transfer level: Needs assistance Equipment used: Rolling walker (2 wheeled) Transfers: Sit to/from Stand Sit to Stand: Supervision         General transfer comment: cues for hand placement and safety  Ambulation/Gait Ambulation/Gait assistance: Min guard Gait Distance (Feet): 300 Feet Assistive device: Rolling walker (2 wheeled) Gait Pattern/deviations: Step-through pattern;Decreased stride length;Trunk flexed     General Gait Details: Slow, steady gait with cues for posture and position in RW. At times stepping outside of RW when turning or backing.     Stairs              Wheelchair Mobility    Modified Rankin (Stroke Patients Only)       Balance Overall balance assessment: Needs assistance   Sitting balance-Leahy Scale: Good       Standing balance-Leahy Scale: Fair                              Cognition Arousal/Alertness: Awake/alert Behavior During Therapy: WFL for tasks assessed/performed Overall Cognitive Status: Within Functional Limits for tasks assessed                                        Exercises      General Comments        Pertinent Vitals/Pain Pain Assessment: No/denies pain Faces Pain Scale: Hurts even more Pain Location: right hip Pain Descriptors / Indicators: Sore Pain Intervention(s): Monitored during session;Repositioned    Home Living                      Prior Function            PT Goals (current goals can now be found in the care plan section) Acute Rehab PT Goals Patient Stated Goal: wants to be home before Mother's Day Potential to Achieve Goals: Good Progress towards PT goals: Progressing toward goals    Frequency    Min 4X/week      PT Plan Current plan remains appropriate    Co-evaluation  AM-PAC PT "6 Clicks" Mobility   Outcome Measure  Help needed turning from your back to your side while in a flat bed without using bedrails?: None Help needed moving from lying on your back to sitting on the side of a flat bed without using bedrails?: None Help needed moving to and from a bed to a chair (including a wheelchair)?: A Little Help needed standing up from a chair using your arms (e.g., wheelchair or bedside chair)?: A Little Help needed to walk in hospital room?: A Little Help needed climbing 3-5 steps with a railing? : A Little 6 Click Score: 20    End of Session Equipment Utilized During Treatment: Gait belt Activity Tolerance: Patient tolerated treatment well Patient left: in chair;with call bell/phone within  reach Nurse Communication: Mobility status PT Visit Diagnosis: Unsteadiness on feet (R26.81);Difficulty in walking, not elsewhere classified (R26.2)     Time: 0981-1914 PT Time Calculation (min) (ACUTE ONLY): 20 min  Charges:  $Gait Training: 8-22 mins                     Governor Rooks, PTA Acute Rehabilitation Services Pager [phone] Office [place] 12/06/2018, 6:00 PM

## 2018-12-06 NOTE — Progress Notes (Signed)
Transcranial Doppler  Date POD PCO2 HCT BP  MCA ACA PCA OPHT SIPH VERT Basilar  4/ 30/20 MS     Right  Left   44  37   -30  -29   25  13   13  10   18  16    *  *   *      12/01/18 MR     Right  Left   36  41   *  *   *  *   28  25   *  45   *  *   *      12/04/18 MR     Right  Left   28  41   -37  *   15  26   18  11    *  *   *  *   *  *    12/06/18 MS      Right  Left   38  22   -33  -13   15  *   16  17   *  *   -25  -24   *            Right  Left                                            Right  Left                                            Right  Left                                        MCA = Middle Cerebral Artery      OPHT = Opthalmic Artery     BASILAR = Basilar Artery   ACA = Anterior Cerebral Artery     SIPH = Carotid Siphon PCA = Posterior Cerebral Artery   VERT = Verterbral Artery                 *-unable to insonate  Normal MCA = 62+\-12 ACA = 50+\-12 PCA = 42+\-23   12/06/2018 4:51 PM Maudry Mayhew, MHA, RVT, RDCS, RDMS

## 2018-12-06 NOTE — Plan of Care (Signed)
  Problem: Education: Goal: Knowledge of disease or condition will improve Outcome: Progressing Goal: Knowledge of secondary prevention will improve Outcome: Progressing Goal: Knowledge of patient specific risk factors addressed and post discharge goals established will improve Outcome: Progressing   Problem: Health Behavior/Discharge Planning: Goal: Ability to manage health-related needs will improve Outcome: Progressing   Problem: Self-Care: Goal: Ability to participate in self-care as condition permits will improve Outcome: Progressing   Problem: Spontaneous Subarachnoid Hemorrhage Tissue Perfusion: Goal: Complications of Spontaneous Subarachnoid Hemorrhage will be minimized Outcome: Progressing   Problem: Education: Goal: Knowledge of General Education information will improve Description Including pain rating scale, medication(s)/side effects and non-pharmacologic comfort measures Outcome: Progressing   Problem: Health Behavior/Discharge Planning: Goal: Ability to manage health-related needs will improve Outcome: Progressing   Problem: Clinical Measurements: Goal: Ability to maintain clinical measurements within normal limits will improve Outcome: Progressing Goal: Will remain free from infection Outcome: Progressing Goal: Diagnostic test results will improve Outcome: Progressing Goal: Respiratory complications will improve Outcome: Progressing Goal: Cardiovascular complication will be avoided Outcome: Progressing   Problem: Activity: Goal: Risk for activity intolerance will decrease Outcome: Progressing   Problem: Nutrition: Goal: Adequate nutrition will be maintained Outcome: Progressing   Problem: Coping: Goal: Level of anxiety will decrease Outcome: Progressing   Problem: Elimination: Goal: Will not experience complications related to bowel motility Outcome: Progressing Goal: Will not experience complications related to urinary retention Outcome:  Progressing   Problem: Pain Managment: Goal: General experience of comfort will improve Outcome: Progressing   Problem: Safety: Goal: Ability to remain free from injury will improve Outcome: Progressing   Problem: Skin Integrity: Goal: Risk for impaired skin integrity will decrease Outcome: Progressing

## 2018-12-06 NOTE — Progress Notes (Signed)
Inpatient Diabetes Program Recommendations  AACE/ADA: New Consensus Statement on Inpatient Glycemic Control  Target Ranges:  Prepandial:   less than 140 mg/dL      Peak postprandial:   less than 180 mg/dL (1-2 hours)      Critically ill patients:  140 - 180 mg/dL   Results for [name] [name], [name] [name] (MRN [id number]) as of 12/06/2018 09:05  Ref. Range 12/05/2018 08:12 12/05/2018 12:16 12/05/2018 15:53 12/05/2018 20:30 12/06/2018 01:00 12/06/2018 04:41  Glucose-Capillary Latest Ref Range: 70 - 99 mg/dL 157 (H) 223 (H) 222 (H) 207 (H) 173 (H) 168 (H)   Review of Glycemic Control  Diabetes history: DM2 Outpatient Diabetes medications: Metformin 500 mg BID, Actos 30 mg every evening Current orders for Inpatient glycemic control: Novolog 0-9 units Q4H; Decadron 10 mg Q8H  Inpatient Diabetes Program Recommendations:   Insulin - Meal Coverage: If steroids are continued, please consider ordering Novolog 3 units TID with meals for meal coverage if patient eats at least 50% of meals.  Thanks, Barnie Alderman, RN, MSN, CDE Diabetes Coordinator Inpatient Diabetes Program [phone] (Team Pager from 8am to 5pm)

## 2018-12-06 NOTE — Progress Notes (Addendum)
Occupational Therapy Treatment Patient Details Name: [name] [name] MRN: [id number] DOB: 04-20-1935 Today's Date: 12/06/2018    History of present illness 84 year old with history of COPD, lung cancer, diabetes, hypertension, hx of L upper lobectomy 2018.  Admitted with sudden onset headache, neck pain.  Found to have a subarachnoid hemorrhage with hypertensive emergency. Cerebral angiogram completed 4/30 with findings normal, no aneurysm, AVM, or fistula identified.   OT comments  Pt presents supine in bed pleasant and willing to participate in therapy session. Pt performing room level mobility using RW with minguard assist. Completed x3 standing grooming ADL, UB and LB bathing with minguard assist throughout; pt standing >20min at sink during ADL task completion. BP 137/70 seated EOB at start of session. Feel POC remains appropriate at this time. Will continue per POC.    Follow Up Recommendations  Home health OT;Supervision/Assistance - 24 hour    Equipment Recommendations  Tub/shower seat          Precautions / Restrictions Precautions Precautions: Fall;Other (comment) Precaution Comments: SBP<140 Restrictions Weight Bearing Restrictions: No       Mobility Bed Mobility Overal bed mobility: Needs Assistance Bed Mobility: Supine to Sit     Supine to sit: Supervision     General bed mobility comments: for general safety  Transfers Overall transfer level: Needs assistance Equipment used: Rolling walker (2 wheeled) Transfers: Sit to/from Stand Sit to Stand: Min guard         General transfer comment: minguard for safety and balance    Balance Overall balance assessment: Needs assistance Sitting-balance support: Feet supported;No upper extremity supported Sitting balance-Leahy Scale: Good     Standing balance support: Bilateral upper extremity supported;During functional activity Standing balance-Leahy Scale: Fair Standing balance comment: able to maintain  static standing with close minguard assist; reliant on UE support during mobility tasks                           ADL either performed or assessed with clinical judgement   ADL Overall ADL's : Needs assistance/impaired     Grooming: Oral care;Standing;Min guard;Wash/dry face;Brushing hair   Upper Body Bathing: Min guard;Standing Upper Body Bathing Details (indicate cue type and reason): pt washing UEs while standing at sink Lower Body Bathing: Min guard;Sit to/from stand Lower Body Bathing Details (indicate cue type and reason): pt performing some LB bathing standing at sink with close minguard for safety Upper Body Dressing : Set up;Sitting Upper Body Dressing Details (indicate cue type and reason): doffing/donning new gown                 Functional mobility during ADLs: Min guard;Rolling walker General ADL Comments: improvements in activity tolerance today     Vision       Perception     Praxis      Cognition Arousal/Alertness: Awake/alert Behavior During Therapy: WFL for tasks assessed/performed Overall Cognitive Status: Within Functional Limits for tasks assessed                                          Exercises     Shoulder Instructions       General Comments BP 137/70 start of session seated EOB    Pertinent Vitals/ Pain       Pain Assessment: No/denies pain  Home Living  Prior Functioning/Environment              Frequency  Min 2X/week        Progress Toward Goals  OT Goals(current goals can now be found in the care plan section)  Progress towards OT goals: Progressing toward goals  Acute Rehab OT Goals Patient Stated Goal: wants to be home before Mother's Day OT Goal Formulation: With patient Time For Goal Achievement: 12/15/18 Potential to Achieve Goals: Good  Plan Discharge plan remains appropriate    Co-evaluation                  AM-PAC OT "6 Clicks" Daily Activity     Outcome Measure   Help from another person eating meals?: None Help from another person taking care of personal grooming?: A Little Help from another person toileting, which includes using toliet, bedpan, or urinal?: A Little Help from another person bathing (including washing, rinsing, drying)?: A Little Help from another person to put on and taking off regular upper body clothing?: None Help from another person to put on and taking off regular lower body clothing?: A Lot 6 Click Score: 19    End of Session Equipment Utilized During Treatment: Gait belt;Rolling walker  OT Visit Diagnosis: Unsteadiness on feet (R26.81);Muscle weakness (generalized) (M62.81)   Activity Tolerance Patient tolerated treatment well   Patient Left in chair;with call bell/phone within reach   Nurse Communication Mobility status        Time: 1829-9371 OT Time Calculation (min): 39 min  Charges: OT General Charges $OT Visit: 1 Visit OT Treatments $Self Care/Home Management : 23-37 mins  Lou Cal, OT Supplemental Rehabilitation Services Pager [phone] Office [place] 12/06/2018, 11:29 AM

## 2018-12-07 ENCOUNTER — Encounter (HOSPITAL_COMMUNITY): Payer: Self-pay

## 2018-12-07 LAB — BASIC METABOLIC PANEL
Anion gap: 8 (ref 5–15)
BUN: 43 mg/dL — ABNORMAL HIGH (ref 8–23)
CO2: 18 mmol/L — ABNORMAL LOW (ref 22–32)
Calcium: 8.1 mg/dL — ABNORMAL LOW (ref 8.9–10.3)
Chloride: 111 mmol/L (ref 98–111)
Creatinine, Ser: 1.29 mg/dL — ABNORMAL HIGH (ref 0.44–1.00)
GFR calc Af Amer: 45 mL/min — ABNORMAL LOW (ref >60)
GFR calc non Af Amer: 39 mL/min — ABNORMAL LOW (ref >60)
Glucose, Bld: 154 mg/dL — ABNORMAL HIGH (ref 70–99)
Potassium: 4.9 mmol/L (ref 3.5–5.1)
Sodium: 137 mmol/L (ref 135–145)

## 2018-12-07 LAB — GLUCOSE, CAPILLARY
Glucose-Capillary: 148 mg/dL — ABNORMAL HIGH (ref 70–99)
Glucose-Capillary: 105 mg/dL — ABNORMAL HIGH (ref 70–99)
Glucose-Capillary: 97 mg/dL (ref 70–99)
Glucose-Capillary: 186 mg/dL — ABNORMAL HIGH (ref 70–99)
Glucose-Capillary: 157 mg/dL — ABNORMAL HIGH (ref 70–99)

## 2018-12-07 MED ORDER — SODIUM CHLORIDE 0.9 % IV BOLUS
500.0000 mL | Freq: Once | INTRAVENOUS | Status: AC
  Administered 2018-12-07: 500 mL via INTRAVENOUS

## 2018-12-07 NOTE — Progress Notes (Signed)
Physical Therapy Treatment Patient Details Name: [name] [name] MRN: [id number] DOB: May 04, 1935 Today's Date: 12/07/2018    History of Present Illness 84 year old with history of COPD, lung cancer, diabetes, hypertension, hx of L upper lobectomy 2018.  Admitted with sudden onset headache, neck pain.  Found to have a subarachnoid hemorrhage with hypertensive emergency. Cerebral angiogram completed 4/30 with findings normal, no aneurysm, AVM, or fistula identified.    PT Comments    Pt motivated to mobilize OOB.  Pt progressing well.  She continues to require step by step cueing for stair negotiation.  Plan for return home with HHPT and assistance from son remains appropriate.  Pt left sitting in recliner chair with needs in reach.  Plan for retrial of stair training next session.     Follow Up Recommendations  Home health PT;Supervision for mobility/OOB     Equipment Recommendations  None recommended by PT    Recommendations for Other Services       Precautions / Restrictions Precautions Precautions: Fall;Other (comment) Precaution Comments: SBP<140 Restrictions Weight Bearing Restrictions: No    Mobility  Bed Mobility Overal bed mobility: Modified Independent Bed Mobility: Supine to Sit     Supine to sit: Modified independent (Device/Increase time)        Transfers Overall transfer level: Modified independent Equipment used: Rolling walker (2 wheeled) Transfers: Sit to/from Stand              Ambulation/Gait Ambulation/Gait assistance: Supervision Gait Distance (Feet): 200 Feet Assistive device: Rolling walker (2 wheeled) Gait Pattern/deviations: Step-through pattern;Decreased stride length;Trunk flexed     General Gait Details: Slow, steady gait with cues for posture and position in RW. At times stepping outside of RW when turning or backing.     Stairs Stairs: Yes Stairs assistance: Min assist Stair Management: Step to pattern;With  walker;Backwards Number of Stairs: 4 General stair comments: Cues for sequencing and RW placement.  performed x2 for accuracy.     Wheelchair Mobility    Modified Rankin (Stroke Patients Only)       Balance Overall balance assessment: Needs assistance   Sitting balance-Leahy Scale: Good       Standing balance-Leahy Scale: Fair                              Cognition Arousal/Alertness: Awake/alert Behavior During Therapy: WFL for tasks assessed/performed Overall Cognitive Status: Within Functional Limits for tasks assessed                                        Exercises      General Comments        Pertinent Vitals/Pain Pain Assessment: No/denies pain Pain Score: 4  Faces Pain Scale: Hurts even more Pain Location: right hip Pain Descriptors / Indicators: Sore Pain Intervention(s): Monitored during session;Repositioned    Home Living                      Prior Function            PT Goals (current goals can now be found in the care plan section) Acute Rehab PT Goals Patient Stated Goal: wants to be home before Mother's Day Potential to Achieve Goals: Good Progress towards PT goals: Progressing toward goals    Frequency    Min 4X/week      PT  Plan Current plan remains appropriate    Co-evaluation              AM-PAC PT "6 Clicks" Mobility   Outcome Measure  Help needed turning from your back to your side while in a flat bed without using bedrails?: None Help needed moving from lying on your back to sitting on the side of a flat bed without using bedrails?: None Help needed moving to and from a bed to a chair (including a wheelchair)?: None Help needed standing up from a chair using your arms (e.g., wheelchair or bedside chair)?: None Help needed to walk in hospital room?: A Little Help needed climbing 3-5 steps with a railing? : A Little 6 Click Score: 22    End of Session Equipment Utilized During  Treatment: Gait belt Activity Tolerance: Patient tolerated treatment well Patient left: in chair;with call bell/phone within reach Nurse Communication: Mobility status PT Visit Diagnosis: Unsteadiness on feet (R26.81);Difficulty in walking, not elsewhere classified (R26.2)     Time: 2258-3462 PT Time Calculation (min) (ACUTE ONLY): 30 min  Charges:  $Gait Training: 8-22 mins $Therapeutic Activity: 8-22 mins                     Governor Rooks, PTA Acute Rehabilitation Services Pager [phone] Office [place] 12/07/2018, 1:51 PM

## 2018-12-07 NOTE — Plan of Care (Signed)
  Problem: Education: Goal: Knowledge of disease or condition will improve Outcome: Progressing Goal: Knowledge of secondary prevention will improve Outcome: Progressing Goal: Knowledge of patient specific risk factors addressed and post discharge goals established will improve Outcome: Progressing   Problem: Health Behavior/Discharge Planning: Goal: Ability to manage health-related needs will improve Outcome: Progressing   Problem: Self-Care: Goal: Ability to participate in self-care as condition permits will improve Outcome: Progressing   Problem: Spontaneous Subarachnoid Hemorrhage Tissue Perfusion: Goal: Complications of Spontaneous Subarachnoid Hemorrhage will be minimized Outcome: Progressing   Problem: Education: Goal: Knowledge of General Education information will improve Description Including pain rating scale, medication(s)/side effects and non-pharmacologic comfort measures Outcome: Progressing   Problem: Health Behavior/Discharge Planning: Goal: Ability to manage health-related needs will improve Outcome: Progressing   Problem: Clinical Measurements: Goal: Ability to maintain clinical measurements within normal limits will improve Outcome: Progressing Goal: Will remain free from infection Outcome: Progressing Goal: Diagnostic test results will improve Outcome: Progressing Goal: Respiratory complications will improve Outcome: Progressing Goal: Cardiovascular complication will be avoided Outcome: Progressing   Problem: Activity: Goal: Risk for activity intolerance will decrease Outcome: Progressing   Problem: Nutrition: Goal: Adequate nutrition will be maintained Outcome: Progressing   Problem: Coping: Goal: Level of anxiety will decrease Outcome: Progressing   Problem: Elimination: Goal: Will not experience complications related to bowel motility Outcome: Progressing Goal: Will not experience complications related to urinary retention Outcome:  Progressing   Problem: Pain Managment: Goal: General experience of comfort will improve Outcome: Progressing   Problem: Safety: Goal: Ability to remain free from injury will improve Outcome: Progressing   Problem: Skin Integrity: Goal: Risk for impaired skin integrity will decrease Outcome: Progressing

## 2018-12-07 NOTE — Progress Notes (Addendum)
  NEUROSURGERY PROGRESS NOTE   No issues overnight.  Minimal headache today  EXAM:  BP (!) 123/40 (BP Location: Right Arm)   Pulse (!) 43   Temp 98.7 F (37.1 C) (Oral)   Resp 16   Ht 5\' 9"  (1.753 m)   Wt 86.3 kg   SpO2 97%   BMI 28.10 kg/m   Awake, alert, oriented  Speech fluent, appropriate  CN grossly intact  5/5 BUE/BLE   IMPRESSION/PLAN 83 y.o.femaleSAH d#8, initial angio negative. Stable neurologically.Mild AKI. - continue supportive care, nimotop - PT/OT - SBP goal <140 - Repeatangio tomorrow. NPO at midnight  - AKI: Fluid bolus. Trend Cr.   Addendum Due to Cr trending upward, will avoid angiogram tomorrow to avoid renal toxicity. Plan for MRI brain and MRA head w/o contrast today. If normal and Cr improved tomorrow, will d/c home.

## 2018-12-07 NOTE — Care Management Important Message (Signed)
Important Message  Patient Details  Name: [name] [name] MRN: [id number] Date of Birth: Nov 07, 1934   Medicare Important Message Given:  Yes    Adel Burch 12/07/2018, 1:49 PM

## 2018-12-08 ENCOUNTER — Other Ambulatory Visit (HOSPITAL_COMMUNITY): Payer: Medicare HMO

## 2018-12-08 ENCOUNTER — Inpatient Hospital Stay (HOSPITAL_COMMUNITY): Payer: Medicare HMO

## 2018-12-08 LAB — URINALYSIS, ROUTINE W REFLEX MICROSCOPIC
Bilirubin Urine: NEGATIVE
Glucose, UA: NEGATIVE mg/dL
Hgb urine dipstick: NEGATIVE
Ketones, ur: NEGATIVE mg/dL
Nitrite: POSITIVE — AB
Protein, ur: NEGATIVE mg/dL
Specific Gravity, Urine: 1.021 (ref 1.005–1.030)
pH: 5 (ref 5.0–8.0)

## 2018-12-08 LAB — GLUCOSE, CAPILLARY
Glucose-Capillary: 114 mg/dL — ABNORMAL HIGH (ref 70–99)
Glucose-Capillary: 118 mg/dL — ABNORMAL HIGH (ref 70–99)
Glucose-Capillary: 121 mg/dL — ABNORMAL HIGH (ref 70–99)
Glucose-Capillary: 132 mg/dL — ABNORMAL HIGH (ref 70–99)
Glucose-Capillary: 166 mg/dL — ABNORMAL HIGH (ref 70–99)
Glucose-Capillary: 174 mg/dL — ABNORMAL HIGH (ref 70–99)
Glucose-Capillary: 96 mg/dL (ref 70–99)

## 2018-12-08 LAB — BASIC METABOLIC PANEL
Anion gap: 10 (ref 5–15)
BUN: 47 mg/dL — ABNORMAL HIGH (ref 8–23)
CO2: 18 mmol/L — ABNORMAL LOW (ref 22–32)
Calcium: 8 mg/dL — ABNORMAL LOW (ref 8.9–10.3)
Chloride: 109 mmol/L (ref 98–111)
Creatinine, Ser: 1.31 mg/dL — ABNORMAL HIGH (ref 0.44–1.00)
GFR calc Af Amer: 44 mL/min — ABNORMAL LOW (ref >60)
GFR calc non Af Amer: 38 mL/min — ABNORMAL LOW (ref >60)
Glucose, Bld: 180 mg/dL — ABNORMAL HIGH (ref 70–99)
Potassium: 5 mmol/L (ref 3.5–5.1)
Sodium: 137 mmol/L (ref 135–145)

## 2018-12-08 MED ORDER — SODIUM CHLORIDE 0.9 % IV SOLN
INTRAVENOUS | Status: DC
  Administered 2018-12-08: 13:00:00 via INTRAVENOUS

## 2018-12-08 MED ORDER — SODIUM CHLORIDE 0.9 % IV BOLUS
500.0000 mL | Freq: Once | INTRAVENOUS | Status: AC
  Administered 2018-12-08: 500 mL via INTRAVENOUS

## 2018-12-08 MED ORDER — LOSARTAN POTASSIUM 50 MG PO TABS
50.0000 mg | ORAL_TABLET | Freq: Every day | ORAL | Status: DC
  Administered 2018-12-10: 09:00:00 50 mg via ORAL
  Filled 2018-12-08: qty 1

## 2018-12-08 NOTE — Progress Notes (Signed)
Physical Therapy Treatment Patient Details Name: [name] [name] MRN: [id number] DOB: 08/13/1934 Today's Date: 12/08/2018    History of Present Illness 84 year old with history of COPD, lung cancer, diabetes, hypertension, hx of L upper lobectomy 2018.  Admitted with sudden onset headache, neck pain.  Found to have a subarachnoid hemorrhage with hypertensive emergency. Cerebral angiogram completed 4/30 with findings normal, no aneurysm, AVM, or fistula identified.    PT Comments    Patient progressing well towards PT goals. Continues to have right hip discomfort but tolerable. Requires Min A for stair training and able to recall technique from prior session. Still needs some cues for safety and assist to stabilize RW when ascending steps. Encouraged pt to have family present to assist with stair negotiation at home. Pt is ambulating and performing transfers Mod I-supervision with use of RW. Will be walking to bathroom more as foley catheter is out. Stair goal added. Will continue to follow.   Follow Up Recommendations  No PT follow up;Supervision - Intermittent     Equipment Recommendations  None recommended by PT    Recommendations for Other Services       Precautions / Restrictions Precautions Precautions: Fall;Other (comment) Precaution Comments: SBP<140 Restrictions Weight Bearing Restrictions: No    Mobility  Bed Mobility Overal bed mobility: Modified Independent Bed Mobility: Supine to Sit     Supine to sit: Modified independent (Device/Increase time)     General bed mobility comments: no assist needed.   Transfers Overall transfer level: Modified independent Equipment used: Rolling walker (2 wheeled) Transfers: Sit to/from Stand Sit to Stand: Modified independent (Device/Increase time)         General transfer comment: Stood from EOB x1, from toilet x1, transferred to chair post ambulation.  Ambulation/Gait Ambulation/Gait assistance: Supervision Gait  Distance (Feet): 200 Feet Assistive device: Rolling walker (2 wheeled) Gait Pattern/deviations: Step-through pattern;Decreased stride length;Trunk flexed     General Gait Details: Slow, steady gait with cues for posture and position in RW.    Stairs Stairs: Yes Stairs assistance: Min assist Stair Management: Step to pattern;With walker;Backwards Number of Stairs: 2 General stair comments: Cues for sequencing and RW placement. Limited due to IV.   Wheelchair Mobility    Modified Rankin (Stroke Patients Only) Modified Rankin (Stroke Patients Only) Pre-Morbid Rankin Score: No significant disability Modified Rankin: Moderate disability     Balance Overall balance assessment: Needs assistance Sitting-balance support: Feet supported;No upper extremity supported Sitting balance-Leahy Scale: Good     Standing balance support: During functional activity;No upper extremity supported Standing balance-Leahy Scale: Fair Standing balance comment: able to maintain static standing with close minguard assist; reliant on UE support during mobility tasks. able to wash hands at sink without Ue support for short time.                            Cognition Arousal/Alertness: Awake/alert Behavior During Therapy: WFL for tasks assessed/performed Overall Cognitive Status: Within Functional Limits for tasks assessed                                        Exercises      General Comments        Pertinent Vitals/Pain Pain Assessment: Faces Faces Pain Scale: Hurts little more Pain Location: right hip, knee Pain Descriptors / Indicators: Sore Pain Intervention(s): Premedicated before session;Monitored during session  Home Living                      Prior Function            PT Goals (current goals can now be found in the care plan section) Progress towards PT goals: Progressing toward goals    Frequency    Min 4X/week      PT Plan  Discharge plan needs to be updated    Co-evaluation              AM-PAC PT "6 Clicks" Mobility   Outcome Measure  Help needed turning from your back to your side while in a flat bed without using bedrails?: None Help needed moving from lying on your back to sitting on the side of a flat bed without using bedrails?: None Help needed moving to and from a bed to a chair (including a wheelchair)?: None Help needed standing up from a chair using your arms (e.g., wheelchair or bedside chair)?: None Help needed to walk in hospital room?: A Little Help needed climbing 3-5 steps with a railing? : A Little 6 Click Score: 22    End of Session Equipment Utilized During Treatment: Gait belt Activity Tolerance: Patient tolerated treatment well Patient left: in chair;with call bell/phone within reach Nurse Communication: Mobility status PT Visit Diagnosis: Unsteadiness on feet (R26.81);Difficulty in walking, not elsewhere classified (R26.2)     Time: 1130-1200 PT Time Calculation (min) (ACUTE ONLY): 30 min  Charges:  $Gait Training: 23-37 mins                     Wray Kearns, [place], DPT Acute Rehabilitation Services Pager [phone] Office [place] 12/08/2018, 12:44 PM

## 2018-12-08 NOTE — Progress Notes (Addendum)
  NEUROSURGERY PROGRESS NOTE   No issues overnight.  No concerns this am  EXAM:  BP (!) 132/43 (BP Location: Left Arm)   Pulse (!) 51   Temp 98.3 F (36.8 C) (Oral)   Resp (!) 22   Ht 5\' 9"  (1.753 m)   Wt 86.3 kg   SpO2 98%   BMI 28.10 kg/m   Awake, alert, oriented  Speech fluent, appropriate  CN grossly intact  5/5 BUE/BLE   IMPRESSION/PLAN 83 y.o.femaleSAH d#9, initial angio negative. Stable neurologically.Mild AKI. - AKI: slight bump in Cr from 1.29-1.31 overnight. IVF, UA to r/o infection. Hold losartan. - MRI/MRA pending. If normal can d/c nimotop - Hopeful for d/c tomorrow if Cr improves  Discussed plan with daughter, [name] [name], time spent 5 min.

## 2018-12-09 LAB — BASIC METABOLIC PANEL
Anion gap: 8 (ref 5–15)
BUN: 38 mg/dL — ABNORMAL HIGH (ref 8–23)
CO2: 18 mmol/L — ABNORMAL LOW (ref 22–32)
Calcium: 8.1 mg/dL — ABNORMAL LOW (ref 8.9–10.3)
Chloride: 111 mmol/L (ref 98–111)
Creatinine, Ser: 0.97 mg/dL (ref 0.44–1.00)
GFR calc Af Amer: >60 mL/min (ref >60)
GFR calc non Af Amer: 55 mL/min — ABNORMAL LOW (ref >60)
Glucose, Bld: 124 mg/dL — ABNORMAL HIGH (ref 70–99)
Potassium: 5.2 mmol/L — ABNORMAL HIGH (ref 3.5–5.1)
Sodium: 137 mmol/L (ref 135–145)

## 2018-12-09 LAB — GLUCOSE, CAPILLARY
Glucose-Capillary: 114 mg/dL — ABNORMAL HIGH (ref 70–99)
Glucose-Capillary: 118 mg/dL — ABNORMAL HIGH (ref 70–99)
Glucose-Capillary: 145 mg/dL — ABNORMAL HIGH (ref 70–99)
Glucose-Capillary: 155 mg/dL — ABNORMAL HIGH (ref 70–99)
Glucose-Capillary: 186 mg/dL — ABNORMAL HIGH (ref 70–99)
Glucose-Capillary: 98 mg/dL (ref 70–99)

## 2018-12-09 MED ORDER — FUROSEMIDE 10 MG/ML IJ SOLN
20.0000 mg | Freq: Once | INTRAMUSCULAR | Status: AC
  Administered 2018-12-09: 20 mg via INTRAVENOUS
  Filled 2018-12-09: qty 2

## 2018-12-09 MED ORDER — CIPROFLOXACIN IN D5W 400 MG/200ML IV SOLN
400.0000 mg | Freq: Two times a day (BID) | INTRAVENOUS | Status: DC
  Administered 2018-12-09 – 2018-12-10 (×3): 400 mg via INTRAVENOUS
  Filled 2018-12-09 (×4): qty 200

## 2018-12-09 NOTE — Progress Notes (Addendum)
Neurosurgery Service Progress Note  Subjective: No acute events overnight.    Objective: Vitals:   12/08/18 1546 12/08/18 2025 12/08/18 2341 12/09/18 0336  BP: 119/68 (!) 137/43 (!) 131/43 (!) 136/54  Pulse: (!) 47 (!) 52 (!) 52 (!) 50  Resp: 16 16 (!) 21 12  Temp: 98.6 F (37 C) 98.2 F (36.8 C) 98.4 F (36.9 C) 98.2 F (36.8 C)  TempSrc: Oral Oral Oral Oral  SpO2: 98% 98% 100% 98%  Weight:      Height:       Temp (24hrs), Avg:98.2 F (36.8 C), Min:97.6 F (36.4 C), Max:98.6 F (37 C)  CBC Latest Ref Rng & Units 11/29/2018 06/05/2018 11/30/2017  WBC 4.0 - 10.5 K/uL 5.9 5.7 6.1  Hemoglobin 12.0 - 15.0 g/dL 10.4(L) 10.5(L) 9.7(L)  Hematocrit 36.0 - 46.0 % 32.4(L) 33.2(L) 29.9(L)  Platelets 150 - 400 K/uL 217 279 238   BMP Latest Ref Rng & Units 12/09/2018 12/08/2018 12/07/2018  Glucose 70 - 99 mg/dL 124(H) 180(H) 154(H)  BUN 8 - 23 mg/dL 38(H) 47(H) 43(H)  Creatinine 0.44 - 1.00 mg/dL 0.97 1.31(H) 1.29(H)  Sodium 135 - 145 mmol/L 137 137 137  Potassium 3.5 - 5.1 mmol/L 5.2(H) 5.0 4.9  Chloride 98 - 111 mmol/L 111 109 111  CO2 22 - 32 mmol/L 18(L) 18(L) 18(L)  Calcium 8.9 - 10.3 mg/dL 8.1(L) 8.0(L) 8.1(L)    Intake/Output Summary (Last 24 hours) at 12/09/2018 0749 Last data filed at 12/09/2018 0700 Gross per 24 hour  Intake 720 ml  Output 600 ml  Net 120 ml    Current Facility-Administered Medications:  .  0.9 %  sodium chloride infusion, , Intravenous, Continuous, Costella, Vista Mink, PA-C, Last Rate: 75 mL/hr at 12/08/18 1242 .  acetaminophen (TYLENOL) tablet 650 mg, 650 mg, Oral, Q6H PRN, Costella, Vincent J, PA-C, 650 mg at 12/08/18 1136 .  amLODipine (NORVASC) tablet 10 mg, 10 mg, Oral, Daily, Jennelle Human B, NP, 10 mg at 12/08/18 1129 .  atorvastatin (LIPITOR) tablet 20 mg, 20 mg, Oral, q1800, Jennelle Human B, NP, 20 mg at 12/08/18 1636 .  ciprofloxacin (CIPRO) IVPB 400 mg, 400 mg, Intravenous, Q12H, Emelynn Rance A, MD .  hydrALAZINE (APRESOLINE) tablet 25 mg,  25 mg, Oral, BID, Jennelle Human B, NP, 25 mg at 12/08/18 2235 .  insulin aspart (novoLOG) injection 0-9 Units, 0-9 Units, Subcutaneous, Q4H, Simpson, Paula B, NP, 1 Units at 12/09/18 0400 .  insulin aspart (novoLOG) injection 3 Units, 3 Units, Subcutaneous, TID WC, Costella, Vincent Lenna Sciara, PA-C, 3 Units at 12/08/18 1637 .  isosorbide dinitrate (ISORDIL) tablet 20 mg, 20 mg, Oral, BID, Jennelle Human B, NP, 20 mg at 12/08/18 2236 .  [START ON 12/10/2018] losartan (COZAAR) tablet 50 mg, 50 mg, Oral, Daily, Costella, Vincent J, PA-C .  methylPREDNISolone (MEDROL DOSEPAK) tablet 4 mg, 4 mg, Oral, 4X daily taper, Costella, Vincent J, PA-C, 4 mg at 12/08/18 2236 .  metoprolol tartrate (LOPRESSOR) tablet 12.5 mg, 12.5 mg, Oral, BID, Jennelle Human B, NP, 12.5 mg at 12/08/18 1130 .  montelukast (SINGULAIR) tablet 10 mg, 10 mg, Oral, QHS, Simpson, Paula B, NP, 10 mg at 12/08/18 2236 .  niMODipine (NIMOTOP) capsule 60 mg, 60 mg, Oral, Q4H, 60 mg at 12/09/18 0639 **OR** [DISCONTINUED] niMODipine (NYMALIZE) 60 MG/20ML oral solution 60 mg, 60 mg, Per Tube, Q4H, Arnell Asal, NP   Physical Exam: AOx3, PERRL, EOMI, FS, Strength 5/5 x4, SILTx4, no drift  Assessment & Plan: 84 y.o. woman w/  angio neg SAH  -Cr still elevated, cont IVF, VS WNL, no clinical e/o volume overload but mild hyperkalemia K=5.2, Cr & GFR prelim improved but awaiting final result of BMP -rpt RFP in AM to re-check K+, hold ARB today and give 20 lasix -rpt UA & Cx then start cipro for UTI, PCN allergic w/ anaphylaxis and no known h/o resistant organisms  Judith Part  12/09/18 7:49 AM

## 2018-12-10 LAB — RENAL FUNCTION PANEL
Albumin: 2.8 g/dL — ABNORMAL LOW (ref 3.5–5.0)
Anion gap: 9 (ref 5–15)
BUN: 35 mg/dL — ABNORMAL HIGH (ref 8–23)
CO2: 18 mmol/L — ABNORMAL LOW (ref 22–32)
Calcium: 7.9 mg/dL — ABNORMAL LOW (ref 8.9–10.3)
Chloride: 107 mmol/L (ref 98–111)
Creatinine, Ser: 1.04 mg/dL — ABNORMAL HIGH (ref 0.44–1.00)
GFR calc Af Amer: 58 mL/min — ABNORMAL LOW (ref >60)
GFR calc non Af Amer: 50 mL/min — ABNORMAL LOW (ref >60)
Glucose, Bld: 147 mg/dL — ABNORMAL HIGH (ref 70–99)
Phosphorus: 3.4 mg/dL (ref 2.5–4.6)
Potassium: 4.9 mmol/L (ref 3.5–5.1)
Sodium: 134 mmol/L — ABNORMAL LOW (ref 135–145)

## 2018-12-10 LAB — GLUCOSE, CAPILLARY
Glucose-Capillary: 124 mg/dL — ABNORMAL HIGH (ref 70–99)
Glucose-Capillary: 88 mg/dL (ref 70–99)
Glucose-Capillary: 95 mg/dL (ref 70–99)

## 2018-12-10 MED ORDER — CIPROFLOXACIN HCL 500 MG PO TABS: 500.0000 mg | ORAL_TABLET | Freq: Two times a day (BID) | ORAL | 0 refills | Status: AC

## 2018-12-10 NOTE — Discharge Instructions (Signed)
Discharge Instructions  No restrictions in activity.  Call your primary care physician after discharge to update them on your recent hospitalization.  Follow up with Dr. Kathyrn Sheriff in 2 weeks after discharge. If you do not already have a discharge appointment, please call his office at [phone] to schedule a follow up appointment. If you have any concerns or questions, please call the office and let us know.

## 2018-12-10 NOTE — Progress Notes (Signed)
Neurosurgery Service Progress Note  Subjective: No acute events overnight.    Objective: Vitals:   12/09/18 1937 12/09/18 2353 12/10/18 0351 12/10/18 0820  BP: (!) 147/50 (!) 123/41 (!) 140/48 (!) 155/53  Pulse: (!) 49 (!) 49 (!) 56 (!) 51  Resp: 20 (!) 24 15 20   Temp: 98.1 F (36.7 C) 99 F (37.2 C) 98.7 F (37.1 C) 98.9 F (37.2 C)  TempSrc: Oral Oral Oral Oral  SpO2: 100% 97% 97%   Weight:      Height:       Temp (24hrs), Avg:98.6 F (37 C), Min:98.1 F (36.7 C), Max:99 F (37.2 C)  CBC Latest Ref Rng & Units 11/29/2018 06/05/2018 11/30/2017  WBC 4.0 - 10.5 K/uL 5.9 5.7 6.1  Hemoglobin 12.0 - 15.0 g/dL 10.4(L) 10.5(L) 9.7(L)  Hematocrit 36.0 - 46.0 % 32.4(L) 33.2(L) 29.9(L)  Platelets 150 - 400 K/uL 217 279 238   BMP Latest Ref Rng & Units 12/10/2018 12/09/2018 12/08/2018  Glucose 70 - 99 mg/dL 147(H) 124(H) 180(H)  BUN 8 - 23 mg/dL 35(H) 38(H) 47(H)  Creatinine 0.44 - 1.00 mg/dL 1.04(H) 0.97 1.31(H)  Sodium 135 - 145 mmol/L 134(L) 137 137  Potassium 3.5 - 5.1 mmol/L 4.9 5.2(H) 5.0  Chloride 98 - 111 mmol/L 107 111 109  CO2 22 - 32 mmol/L 18(L) 18(L) 18(L)  Calcium 8.9 - 10.3 mg/dL 7.9(L) 8.1(L) 8.0(L)    Intake/Output Summary (Last 24 hours) at 12/10/2018 0831 Last data filed at 12/10/2018 0553 Gross per 24 hour  Intake 1440 ml  Output 1700 ml  Net -260 ml    Current Facility-Administered Medications:  .  0.9 %  sodium chloride infusion, , Intravenous, Continuous, Talayla Doyel, Joyice Faster, MD, Last Rate: 100 mL/hr at 12/09/18 0849 .  acetaminophen (TYLENOL) tablet 650 mg, 650 mg, Oral, Q6H PRN, Costella, Vincent J, PA-C, 650 mg at 12/08/18 1136 .  amLODipine (NORVASC) tablet 10 mg, 10 mg, Oral, Daily, Jennelle Human B, NP, 10 mg at 12/09/18 0847 .  atorvastatin (LIPITOR) tablet 20 mg, 20 mg, Oral, q1800, Jennelle Human B, NP, 20 mg at 12/09/18 1804 .  ciprofloxacin (CIPRO) IVPB 400 mg, 400 mg, Intravenous, BID, Karna Abed A, MD, Last Rate: 200 mL/hr at 12/09/18  2126, 400 mg at 12/09/18 2126 .  hydrALAZINE (APRESOLINE) tablet 25 mg, 25 mg, Oral, BID, Jennelle Human B, NP, 25 mg at 12/09/18 2123 .  insulin aspart (novoLOG) injection 0-9 Units, 0-9 Units, Subcutaneous, Q4H, Jennelle Human B, NP, 1 Units at 12/10/18 0409 .  insulin aspart (novoLOG) injection 3 Units, 3 Units, Subcutaneous, TID WC, Costella, Vincent Lenna Sciara, PA-C, 3 Units at 12/09/18 1804 .  isosorbide dinitrate (ISORDIL) tablet 20 mg, 20 mg, Oral, BID, Jennelle Human B, NP, 20 mg at 12/09/18 2126 .  losartan (COZAAR) tablet 50 mg, 50 mg, Oral, Daily, Costella, Vincent J, PA-C .  methylPREDNISolone (MEDROL DOSEPAK) tablet 4 mg, 4 mg, Oral, 4X daily taper, Costella, Vincent J, PA-C, 4 mg at 12/09/18 2124 .  metoprolol tartrate (LOPRESSOR) tablet 12.5 mg, 12.5 mg, Oral, BID, Jennelle Human B, NP, 12.5 mg at 12/09/18 0848 .  montelukast (SINGULAIR) tablet 10 mg, 10 mg, Oral, QHS, Simpson, Paula B, NP, 10 mg at 12/09/18 2123 .  niMODipine (NIMOTOP) capsule 60 mg, 60 mg, Oral, Q4H, 60 mg at 12/10/18 3267 **OR** [DISCONTINUED] niMODipine (NYMALIZE) 60 MG/20ML oral solution 60 mg, 60 mg, Per Tube, Q4H, Arnell Asal, NP   Physical Exam: AOx3, PERRL, EOMI, FS, Strength 5/5 x4, SILTx4,  no drift  Assessment & Plan: 84 y.o. woman w/ angio neg SAH  -GFR back to pt's baseline, K+ improved, mild hyponatremia as expected 2/2 lasix -discharge w/ 5d course of ABx for UTI  [name] [name]  12/10/18 8:31 AM

## 2018-12-10 NOTE — Progress Notes (Signed)
Discharge instructions given. Pt verbalized understanding and all questions were answered.  

## 2018-12-10 NOTE — Discharge Summary (Signed)
Discharge Summary  Date of Admission: 11/29/2018  Date of Discharge: 12/10/18  Attending Physician: Consuella Lose, MD  Hospital Course: Patient was admitted with severe neck pain and headache and was diagnosed with spontaneous subarachnoid hemorrhage. A catheter angiogram was negative. The patient was monitored without change in exam. She had some mild acute on chronic renal insufficiency that resolved with fluids and holding her ARB. Due to her renal function, an MRI/MRA was used as her repeat study, which showed no evidence of spasms or malformation. Her hospital course was otherwise uncomplicated and the patient was discharged home with home PT on 12/10/2018. She will follow up in clinic with Dr. Kathyrn Sheriff in 2 weeks.  Neurologic exam at discharge:  AOx3, PERRL, EOMI, FS, TM Strength 5/5 x4, SILTx4, no drift  Discharge diagnosis: Angio-negative subarachnoid hemorrhage  Judith Part, MD 12/10/18 8:34 AM

## 2018-12-10 NOTE — Care Management (Addendum)
Spoke with patient regarding d/c plan.   Pt states she has a shower/tub bench and does not need any DME for home.    Pt previously set up with [facility] for PT/OT. Home health/Face to face orders needed and have been requested this am.  Glyn Ade with [facility] updated.  DC summary faxed to [facility] in lieu of an order.  Dorian Pod will f/u with PCP for order ASAP tomorrow if needed.

## 2018-12-11 LAB — URINE CULTURE

## 2018-12-18 ENCOUNTER — Other Ambulatory Visit: Payer: Self-pay

## 2018-12-18 NOTE — Patient Outreach (Signed)
[facility]) Care Management  12/18/2018  [name] [name] 1935-02-04 [id number]    EMMI-STROKE RED ON EMMI ALERT Day # 6 Date: 12/17/2018 Red Alert Reason: "Feeling worse overall? Yes  New or worsening pain/fever/SOB? Yes  Smoked or been around smoke? Yes"   Outreach attempt # 1 to patient. Spoke with patient. She denies any acute or urgent issues or concerns at this time. Reviewed and addressed red alerts with patient. She complains of still having some soreness. She reports that she feels it is related to being I the hospital bed all the time while hospitalized. She is working with HHPT-visit scheduled for tomorrow to regain strength and mobility. She reports that she did not need OT as she is able to perform her own personal care so she cancelled services. She has supportive son in the home as well to assist her as needed. She confirmed that she has all her meds. She reports that Cipro was making her nauseated initially when she was taking it. Advised patient to ensure she is not taking med on empty stomach and she voiced understanding. She states she has MD follow up appt on 12/27/2018. She denies any further RN CM needs or concerns at this time. RN CM reviewed s/s of worsening condition and when to seek medical attention. She voiced understanding. Advised patient that they would continue to get automated EMMI-Stroke post discharge calls to assess how they are doing following recent hospitalization and will receive a call from a nurse if any of their responses were abnormal. Patient voiced understanding and was appreciative of f/u call.    Plan: RN CM will close case as no further interventions needed at this time.  Enzo Montgomery, RN,BSN,CCM [place] [facility] Telephonic Care Management Coordinator Direct Phone: [phone] Toll Free: [phone] Fax: [phone]

## 2019-01-05 ENCOUNTER — Encounter (HOSPITAL_COMMUNITY): Payer: Self-pay

## 2019-01-05 ENCOUNTER — Ambulatory Visit (HOSPITAL_COMMUNITY)
Admission: RE | Admit: 2019-01-05 | Discharge: 2019-01-05 | Disposition: A | Discharge status: Home / Self Care | Payer: Medicare HMO | Source: Ambulatory Visit | Attending: Internal Medicine | Admitting: Internal Medicine

## 2019-01-05 ENCOUNTER — Ambulatory Visit (HOSPITAL_COMMUNITY): Payer: Medicare HMO

## 2019-01-05 ENCOUNTER — Other Ambulatory Visit: Payer: Self-pay

## 2019-01-05 DIAGNOSIS — C3490 Malignant neoplasm of unspecified part of unspecified bronchus or lung: Secondary | ICD-10-CM | POA: Insufficient documentation

## 2019-01-05 MED ORDER — SODIUM CHLORIDE (PF) 0.9 % IJ SOLN
INTRAMUSCULAR | Status: AC
  Filled 2019-01-05: qty 50

## 2019-01-05 MED ORDER — IOHEXOL 300 MG/ML  SOLN
75.0000 mL | Freq: Once | INTRAMUSCULAR | Status: AC | PRN
  Administered 2019-01-05: 75 mL via INTRAVENOUS

## 2019-01-08 ENCOUNTER — Encounter: Payer: Self-pay | Admitting: Internal Medicine

## 2019-01-08 ENCOUNTER — Other Ambulatory Visit: Payer: Self-pay

## 2019-01-08 ENCOUNTER — Inpatient Hospital Stay: Payer: Medicare HMO | Attending: Internal Medicine | Admitting: Internal Medicine

## 2019-01-08 VITALS — BP 155/61 | HR 72 | Temp 99.1°F | Resp 18 | Ht 69.0 in | Wt 181.1 lb

## 2019-01-08 DIAGNOSIS — E119 Type 2 diabetes mellitus without complications: Secondary | ICD-10-CM

## 2019-01-08 DIAGNOSIS — C3412 Malignant neoplasm of upper lobe, left bronchus or lung: Secondary | ICD-10-CM

## 2019-01-08 DIAGNOSIS — Z8673 Personal history of transient ischemic attack (TIA), and cerebral infarction without residual deficits: Secondary | ICD-10-CM

## 2019-01-08 DIAGNOSIS — J439 Emphysema, unspecified: Secondary | ICD-10-CM

## 2019-01-08 DIAGNOSIS — Z902 Acquired absence of lung [part of]: Secondary | ICD-10-CM

## 2019-01-08 DIAGNOSIS — Z7982 Long term (current) use of aspirin: Secondary | ICD-10-CM | POA: Diagnosis not present

## 2019-01-08 DIAGNOSIS — I10 Essential (primary) hypertension: Secondary | ICD-10-CM | POA: Diagnosis not present

## 2019-01-08 DIAGNOSIS — C3490 Malignant neoplasm of unspecified part of unspecified bronchus or lung: Secondary | ICD-10-CM

## 2019-01-08 DIAGNOSIS — Z79899 Other long term (current) drug therapy: Secondary | ICD-10-CM | POA: Diagnosis not present

## 2019-01-08 DIAGNOSIS — Z7984 Long term (current) use of oral hypoglycemic drugs: Secondary | ICD-10-CM | POA: Diagnosis not present

## 2019-01-08 NOTE — Progress Notes (Signed)
[place] [phone]   [phone]  OFFICE PROGRESS NOTE  [address], [place] [address]  DIAGNOSIS:  Stage IA (T1b, N0, M0) non-small cell lung cancer, squamous cell carcinoma presented with left upper lobe pulmonary nodule   PRIOR THERAPY: Status post left upper lobectomy with lymph node dissection under the care of Dr. Roxan Hockey on April 21, 2017.  CURRENT THERAPY: Observation.  INTERVAL HISTORY: [name] [name] 84 y.o. female returns to the clinic today for follow-up visit.  The patient recently was admitted to [facility] with spontaneous subarachnoid hemorrhage secondary to small aneurysm.  He was treated conservatively and the patient is feeling much better.  She continues to have intermittent left-sided rib pain and feeling a knot at the site of her surgery.  She denied having any shortness of breath, cough or hemoptysis.  She denied having any fever or chills.  She has no nausea, vomiting, diarrhea or constipation.  She had repeat CT scan of the chest performed recently and she is here for evaluation and discussion of her scan results.  MEDICAL HISTORY: Past Medical History:  Diagnosis Date  . Anginal pain ([place])   . Arthritis   . Asthma   . Coronary artery disease   . Diabetes mellitus   . Dyspnea   . Early cataracts, bilateral   . GERD (gastroesophageal reflux disease)    occ  . Hypertension   . Lung cancer ([place])   . Mass of upper lobe of left lung    mediastinal adenopathy  . Palpitations   . Pneumonia   . Stroke [facility])    mini stroke  . Wears dentures   . Wears glasses     ALLERGIES:  is allergic to penicillins and codeine.  MEDICATIONS:  Current Outpatient Medications  Medication Sig Dispense Refill  . amLODipine (NORVASC) 10 MG tablet Take 10 mg by mouth daily.      Marland Kitchen AQUALANCE LANCETS 30G MISC     . aspirin EC 81 MG tablet Take 81 mg by mouth daily.      Marland Kitchen  atorvastatin (LIPITOR) 20 MG tablet Take 20 mg by mouth daily at 6 PM.     . Blood Glucose Calibration (OT ULTRA/FASTTK CNTRL SOLN) SOLN     . Blood Glucose Monitoring Suppl (ONE TOUCH ULTRA 2) w/Device KIT     . Cholecalciferol (VITAMIN D3) 125 MCG (5000 UT) TABS Take 5,000 Units by mouth daily.     Marland Kitchen docusate sodium (COLACE) 100 MG capsule Take 100 mg by mouth daily as needed for mild constipation.    Marland Kitchen guaifenesin (ROBITUSSIN) 100 MG/5ML syrup Take 200 mg by mouth 3 (three) times daily as needed for cough.    . hydrALAZINE (APRESOLINE) 25 MG tablet Take 25 mg by mouth 2 (two) times daily.     . isosorbide dinitrate (ISORDIL) 20 MG tablet Take 20 mg by mouth 2 (two) times daily.     Marland Kitchen ketotifen (ZADITOR) 0.025 % ophthalmic solution Place 1 drop into both eyes as needed (itchy eyes).    Elmore Guise Devices (ADJUSTABLE LANCING DEVICE) MISC     . losartan (COZAAR) 50 MG tablet Take 50 mg by mouth daily.    . meclizine (ANTIVERT) 25 MG tablet Take 25 mg by mouth 2 (two) times daily as needed for dizziness.    . metFORMIN (GLUCOPHAGE) 500 MG tablet Take 500 mg by mouth 2 (two) times daily.     Marland Kitchen  metoprolol succinate (TOPROL-XL) 25 MG 24 hr tablet Take 25 mg by mouth every evening.     . montelukast (SINGULAIR) 10 MG tablet Take 10 mg by mouth daily as needed (allergy symptoms).     . nitroGLYCERIN (NITROSTAT) 0.4 MG SL tablet Place 0.4 mg under the tongue every 5 (five) minutes as needed for chest pain.    Marland Kitchen omeprazole (PRILOSEC) 20 MG capsule Take 20 mg by mouth daily.    . ONE TOUCH ULTRA TEST test strip     . pioglitazone (ACTOS) 30 MG tablet Take 30 mg by mouth every evening.     . temazepam (RESTORIL) 15 MG capsule Take 15 mg by mouth at bedtime.    . valsartan (DIOVAN) 160 MG tablet Take 160 mg by mouth daily.   5   No current facility-administered medications for this visit.     SURGICAL HISTORY:  Past Surgical History:  Procedure Laterality Date  . ABDOMINAL HYSTERECTOMY    . ANKLE  SURGERY Left    fusion  . COLONOSCOPY W/ BIOPSIES AND POLYPECTOMY    . IR ANGIO EXTRACRAN SEL COM CAROTID INNOMINATE UNI BILAT MOD SED  11/30/2018  . IR ANGIO VERTEBRAL SEL VERTEBRAL BILAT MOD SED  11/30/2018  . IR ANGIOGRAM SELECTIVE EACH ADDITIONAL VESSEL  11/30/2018  . MULTIPLE TOOTH EXTRACTIONS    . TOTAL KNEE ARTHROPLASTY Right   . VIDEO ASSISTED THORACOSCOPY (VATS)/ LOBECTOMY Left 04/21/2017   Procedure: VIDEO ASSISTED THORACOSCOPY (VATS)/LEFT UPPER LOBECTOMY;  Surgeon: Melrose Nakayama, MD;  Location: [place];  Service: Thoracic;  Laterality: Left;  Marland Kitchen VIDEO BRONCHOSCOPY WITH ENDOBRONCHIAL NAVIGATION N/A 01/21/2017   Procedure: VIDEO BRONCHOSCOPY WITH ENDOBRONCHIAL NAVIGATION;  Surgeon: Melrose Nakayama, MD;  Location: [place];  Service: Thoracic;  Laterality: N/A;  . VIDEO BRONCHOSCOPY WITH ENDOBRONCHIAL ULTRASOUND N/A 01/21/2017   Procedure: VIDEO BRONCHOSCOPY WITH ENDOBRONCHIAL ULTRASOUND;  Surgeon: Melrose Nakayama, MD;  Location: [place];  Service: Thoracic;  Laterality: N/A;    REVIEW OF SYSTEMS:  A comprehensive review of systems was negative except for: Respiratory: positive for pleurisy/chest pain   PHYSICAL EXAMINATION: General appearance: alert, cooperative and no distress Head: Normocephalic, without obvious abnormality, atraumatic Neck: no adenopathy, no JVD, supple, symmetrical, trachea midline and thyroid not enlarged, symmetric, no tenderness/mass/nodules Lymph nodes: Cervical, supraclavicular, and axillary nodes normal. Resp: clear to auscultation bilaterally Back: symmetric, no curvature. ROM normal. No CVA tenderness. Cardio: regular rate and rhythm, S1, S2 normal, no murmur, click, rub or gallop GI: soft, non-tender; bowel sounds normal; no masses,  no organomegaly Extremities: extremities normal, atraumatic, no cyanosis or edema  ECOG PERFORMANCE STATUS: 1 - Symptomatic but completely ambulatory  Blood pressure (!) 155/61, pulse 72, temperature 99.1 F  (37.3 C), temperature source Oral, resp. rate 18, height 5' 9" (1.753 m), weight 181 lb 1.6 oz (82.1 kg), SpO2 98 %.  LABORATORY DATA: Lab Results  Component Value Date   WBC 5.9 11/29/2018   HGB 10.4 (L) 11/29/2018   HCT 32.4 (L) 11/29/2018   MCV 81.2 11/29/2018   PLT 217 11/29/2018      Chemistry      Component Value Date/Time   NA 134 (L) 12/10/2018 0250   NA 140 06/02/2017 1322   K 4.9 12/10/2018 0250   K 4.2 06/02/2017 1322   CL 107 12/10/2018 0250   CO2 18 (L) 12/10/2018 0250   CO2 20 (L) 06/02/2017 1322   BUN 35 (H) 12/10/2018 0250   BUN 22.9 06/02/2017 1322  CREATININE 1.04 (H) 12/10/2018 0250   CREATININE 1.11 (H) 06/05/2018 0815   CREATININE 1.2 (H) 06/02/2017 1322      Component Value Date/Time   CALCIUM 7.9 (L) 12/10/2018 0250   CALCIUM 9.3 06/02/2017 1322   ALKPHOS 48 06/05/2018 0815   ALKPHOS 48 06/02/2017 1322   AST 12 (L) 06/05/2018 0815   AST 11 06/02/2017 1322   ALT 7 06/05/2018 0815   ALT 6 06/02/2017 1322   BILITOT 0.2 (L) 06/05/2018 0815   BILITOT 0.36 06/02/2017 1322       RADIOGRAPHIC STUDIES: Ct Chest W Contrast  Result Date: 01/05/2019 CLINICAL DATA:  Left lung cancer, status post surgery EXAM: CT CHEST WITH CONTRAST TECHNIQUE: Multidetector CT imaging of the chest was performed during intravenous contrast administration. CONTRAST:  16m OMNIPAQUE IOHEXOL 300 MG/ML  SOLN COMPARISON:  06/05/2018 FINDINGS: Cardiovascular: Heart is normal in size.  No pericardial effusion. No evidence of thoracic aortic aneurysm. Atherosclerotic calcifications of the aortic arch. Three vessel coronary atherosclerosis. Mediastinum/Nodes: Small thoracic lymph nodes, stable versus mildly improved, including: --15 mm short axis low right paratracheal node (series 2/image 54), previously 16 mm --12 mm short axis subcarinal node (series 2/image 87), previously 15 mm --10 mm short axis right hilar node (series 2/image 59), previously 12 mm Visualized thyroid is  unremarkable. Lungs/Pleura: Status post left upper lobectomy. Moderate centrilobular and paraseptal emphysematous changes, right upper lobe predominant. Mild compressive atelectasis in the medial right lower lobe. No focal consolidation. 6 mm triangular subpleural nodule in the medial right upper lobe (series 7/image 47), unchanged. No new/suspicious pulmonary nodules. No pleural effusion or pneumothorax. Upper Abdomen: Visualized upper abdomen is notable for vascular calcifications. Musculoskeletal: Degenerative changes of the visualized thoracolumbar spine. IMPRESSION: Status post left upper lobectomy. No findings specific for recurrent or metastatic disease. Mildly prominent thoracic lymph nodes, stable versus mildly improved. Aortic Atherosclerosis (ICD10-I70.0) and Emphysema (ICD10-J43.9). Electronically Signed   By: SJulian HyM.D.   On: 01/05/2019 13:16    ASSESSMENT AND PLAN: This is a very pleasant 84years old African-American female with a stage Ia non-small cell lung cancer status post left upper lobectomy with lymph node dissection.   She is currently on observation and she is feeling fine with no concerning complaints. Repeat CT scan of the chest performed recently showed no concerning findings for disease recurrence or progression. I recommended for the patient to continue on observation with repeat CT scan of the chest in 6 months. The patient was advised to call immediately if she has any concerning symptoms in the interval. The patient voices understanding of current disease status and treatment options and is in agreement with the current care plan. All questions were answered. The patient knows to call the clinic with any problems, questions or concerns. We can certainly see the patient much sooner if necessary.  I spent 10 minutes counseling the patient face to face. The total time spent in the appointment was 15 minutes.  Disclaimer: This note was dictated with voice  recognition software. Similar sounding words can inadvertently be transcribed and may not be corrected upon review.

## 2019-01-09 ENCOUNTER — Telehealth: Payer: Self-pay | Admitting: Internal Medicine

## 2019-01-09 NOTE — Telephone Encounter (Signed)
Scheduled appt per 6/08 los - reminder letter mailed with appt date and time

## 2019-01-23 ENCOUNTER — Ambulatory Visit: Payer: Medicare HMO | Admitting: Thoracic Surgery (Cardiothoracic Vascular Surgery)

## 2019-01-23 ENCOUNTER — Other Ambulatory Visit: Payer: Self-pay

## 2019-01-23 ENCOUNTER — Encounter: Payer: Self-pay | Admitting: Thoracic Surgery (Cardiothoracic Vascular Surgery)

## 2019-01-23 VITALS — BP 155/65 | HR 68 | Temp 97.0°F | Resp 18 | Ht 69.0 in | Wt 180.0 lb

## 2019-01-23 DIAGNOSIS — Z902 Acquired absence of lung [part of]: Secondary | ICD-10-CM

## 2019-01-23 DIAGNOSIS — C3492 Malignant neoplasm of unspecified part of left bronchus or lung: Secondary | ICD-10-CM

## 2019-01-23 NOTE — Progress Notes (Signed)
[name] ParkSuite [address]       [name],[name] [name]             [phone]      HPI: [name] [name] returns due to a lump near her surgical incision.  [name] [name] is an 84 year old woman with a past history of tobacco abuse who had a thoracoscopic left upper lobectomy in September 2018 for a stage Ia squamous cell carcinoma.  She did not require adjuvant therapy.  I last saw her in January 2020.  She was complaining of a lump that her left costal margin.  She has had that going back to at least July 2019.  She now returns with a similar concern.  She complains of a painful lump.  Certain bras that she wears because significant discomfort in that area.  She has been taking extra strength Tylenol for that.  Past Medical History:  Diagnosis Date  . Anginal pain ([place])   . Arthritis   . Asthma   . Coronary artery disease   . Diabetes mellitus   . Dyspnea   . Early cataracts, bilateral   . GERD (gastroesophageal reflux disease)    occ  . Hypertension   . Lung cancer ([place])   . Mass of upper lobe of left lung    mediastinal adenopathy  . Palpitations   . Pneumonia   . Stroke [facility])    mini stroke  . Wears dentures   . Wears glasses     Current Outpatient Medications  Medication Sig Dispense Refill  . amLODipine (NORVASC) 10 MG tablet Take 10 mg by mouth daily.      Marland Kitchen AQUALANCE LANCETS 30G MISC     . aspirin EC 81 MG tablet Take 81 mg by mouth daily.      Marland Kitchen atorvastatin (LIPITOR) 20 MG tablet Take 20 mg by mouth daily at 6 PM.     . Blood Glucose Calibration (OT ULTRA/FASTTK CNTRL SOLN) SOLN     . Blood Glucose Monitoring Suppl (ONE TOUCH ULTRA 2) w/Device KIT     . Cholecalciferol (VITAMIN D3) 125 MCG (5000 UT) TABS Take 5,000 Units by mouth daily.     Marland Kitchen guaifenesin (ROBITUSSIN) 100 MG/5ML syrup Take 200 mg by mouth 3 (three) times daily as needed for cough.    . hydrALAZINE (APRESOLINE) 25 MG tablet Take 25 mg by mouth 2 (two) times daily.     . isosorbide dinitrate  (ISORDIL) 20 MG tablet Take 20 mg by mouth 2 (two) times daily.     Marland Kitchen ketotifen (ZADITOR) 0.025 % ophthalmic solution Place 1 drop into both eyes as needed (itchy eyes).    Elmore Guise Devices (ADJUSTABLE LANCING DEVICE) MISC     . losartan (COZAAR) 50 MG tablet Take 50 mg by mouth daily.    . meclizine (ANTIVERT) 25 MG tablet Take 25 mg by mouth 2 (two) times daily as needed for dizziness.    . metFORMIN (GLUCOPHAGE) 500 MG tablet Take 500 mg by mouth 2 (two) times daily.     . metoprolol succinate (TOPROL-XL) 25 MG 24 hr tablet Take 25 mg by mouth every evening.     . montelukast (SINGULAIR) 10 MG tablet Take 10 mg by mouth daily as needed (allergy symptoms).     . nitroGLYCERIN (NITROSTAT) 0.4 MG SL tablet Place 0.4 mg under the tongue every 5 (five) minutes as needed for chest pain.    Marland Kitchen omeprazole (PRILOSEC) 20 MG capsule Take 20  mg by mouth daily.    . ONE TOUCH ULTRA TEST test strip     . pioglitazone (ACTOS) 30 MG tablet Take 30 mg by mouth every evening.     . temazepam (RESTORIL) 15 MG capsule Take 15 mg by mouth at bedtime as needed.     . valsartan (DIOVAN) 160 MG tablet Take 160 mg by mouth daily.   5   No current facility-administered medications for this visit.     Physical Exam BP (!) 155/65 (BP Location: Right Arm, Patient Position: Sitting, Cuff Size: Large)   Pulse 68   Temp (!) 97 F (36.1 C) (Other (Comment)) Comment (Src): thermal  Resp 18   Ht 5' 9"  (1.753 m)   Wt 180 lb (81.6 kg)   SpO2 95% Comment: RA  BMI 26.39 kg/m  84 year old woman in no acute distress Alert and oriented x3 with no focal deficits Lungs diminished at left base, otherwise clear Cardiac regular rate and rhythm, normal S1 and S2 Incision and chest tube site well-healed Bony prominence of left costal margin  Diagnostic Tests: CT CHEST WITH CONTRAST  TECHNIQUE: Multidetector CT imaging of the chest was performed during intravenous contrast administration.  CONTRAST:  36m OMNIPAQUE  IOHEXOL 300 MG/ML  SOLN  COMPARISON:  06/05/2018  FINDINGS: Cardiovascular: Heart is normal in size.  No pericardial effusion.  No evidence of thoracic aortic aneurysm. Atherosclerotic calcifications of the aortic arch.  Three vessel coronary atherosclerosis.  Mediastinum/Nodes: Small thoracic lymph nodes, stable versus mildly improved, including:  --15 mm short axis low right paratracheal node (series 2/image 54), previously 16 mm  --12 mm short axis subcarinal node (series 2/image 87), previously 15 mm  --10 mm short axis right hilar node (series 2/image 59), previously 12 mm  Visualized thyroid is unremarkable.  Lungs/Pleura: Status post left upper lobectomy.  Moderate centrilobular and paraseptal emphysematous changes, right upper lobe predominant.  Mild compressive atelectasis in the medial right lower lobe.  No focal consolidation.  6 mm triangular subpleural nodule in the medial right upper lobe (series 7/image 47), unchanged. No new/suspicious pulmonary nodules.  No pleural effusion or pneumothorax.  Upper Abdomen: Visualized upper abdomen is notable for vascular calcifications.  Musculoskeletal: Degenerative changes of the visualized thoracolumbar spine.  IMPRESSION: Status post left upper lobectomy.  No findings specific for recurrent or metastatic disease.  Mildly prominent thoracic lymph nodes, stable versus mildly improved.  Aortic Atherosclerosis (ICD10-I70.0) and Emphysema (ICD10-J43.9).   Electronically Signed   By: SJulian HyM.D.   On: 01/05/2019 13:16 I personally reviewed the CT images and concur with the findings noted above.  There is nothing suspicious about the left costal margin.  Impression: [name] Luzis an 84year old woman who had a thoracoscopic left upper lobectomy for a stage Ia squamous cell carcinoma in September 2018.  Ever since the surgery she is complained of a lump at her left costal  margin.  This is the edge of her rib cage.  There is some muscle laxity in that area which makes it more prominent than the area on the right.  There have been no changes in this area on CT scan to suggest any underlying pathology.  There is nothing further to do regarding that issue.   Plan: No further work-up needed at this time.  SMelrose Nakayama MD [facility] ([phone]

## 2019-02-01 ENCOUNTER — Ambulatory Visit (INDEPENDENT_AMBULATORY_CARE_PROVIDER_SITE_OTHER): Payer: Medicare HMO

## 2019-02-01 ENCOUNTER — Telehealth: Payer: Self-pay | Admitting: Orthopedic Surgery

## 2019-02-01 ENCOUNTER — Other Ambulatory Visit: Payer: Self-pay

## 2019-02-01 ENCOUNTER — Encounter: Payer: Self-pay | Admitting: Family Medicine

## 2019-02-01 ENCOUNTER — Ambulatory Visit (INDEPENDENT_AMBULATORY_CARE_PROVIDER_SITE_OTHER): Payer: Medicare HMO | Admitting: Family Medicine

## 2019-02-01 DIAGNOSIS — M25551 Pain in right hip: Secondary | ICD-10-CM

## 2019-02-01 DIAGNOSIS — M25511 Pain in right shoulder: Secondary | ICD-10-CM | POA: Diagnosis not present

## 2019-02-01 DIAGNOSIS — M25561 Pain in right knee: Secondary | ICD-10-CM

## 2019-02-01 MED ORDER — DICLOFENAC SODIUM 1 % TD GEL: 4.0000 g | Freq: Four times a day (QID) | TRANSDERMAL | 6 refills | Status: DC | PRN

## 2019-02-01 NOTE — Progress Notes (Signed)
Office Visit Note   Patient: [name] [name]           Date of Birth: 26-Oct-1934           MRN: [id number] Visit Date: 02/01/2019 Requested by: Willey Blade, [address] [place] [place],  [place] [address] PCP: Willey Blade, MD  Subjective: Chief Complaint  Patient presents with  . fell in kitchen 01/30/19 - pain right side  . pain right hip/buttock, shoulder, knee    HPI: She is here with right shoulder, right posterior hip, and knee pain.  She fell in her kitchen on June 30, she was sitting in the chair and the leg broke off causing her to fall on her right side.  No loss of consciousness, her son helped her up.  She has been able to walk but has had pain in the right shoulder and posterior hip and knee since then.  Pain is mainly with activity, not constant.  She is status post right knee replacement years ago but has never had troubles with her shoulder or hip.  Denies any groin pain.  She has a little bit of neck pain.  She is not taking medication for her pain.                ROS: Denies fevers or chills.  All other systems were reviewed and are negative.  Objective: Vital Signs: There were no vitals taken for this visit.  Physical Exam:  General:  Alert and oriented, in no acute distress. Pulm:  Breathing unlabored. Psy:  Normal mood, congruent affect. Skin: No bruising visible. Right shoulder: Full active range of motion, 5/5 rotator cuff strength.  She is tender in the trapezius area, not tender along the neck.  Good neck range of motion. Right hip: No pain with internal rotation.  She is tender near the sacrum and in the gluteal area. Right knee: 1+ effusion with no warmth or erythema.  Full active extension, flexion of 120.  Soft tissue prominence over the lateral aspect of the knee.   Imaging: X-rays right shoulder: Moderate AC joint arthropathy, no dislocation, no sign of fracture.  X-rays pelvis: Mild to moderate right hip DJD with no sign of  pelvic fracture.  X-rays right knee: Prosthesis is intact with no sign of loosening, no acute fracture seen.    Assessment & Plan: 1.  Status post fall with right shoulder, right posterior hip and right knee contusions -Trial of Voltaren gel.  Call me next week if not improving, we will refer her to physical therapy at that point.     Procedures: No procedures performed  No notes on file     PMFS History: Patient Active Problem List   Diagnosis Date Noted  . Hypertensive emergency 12/02/2018  . Subarachnoid hemorrhage ([place]) 11/29/2018  . S/P lobectomy of lung 04/21/2017  . 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 ([place]) 02/16/2017  . Solitary pulmonary nodule 02/08/2017  . Shortness of breath 12/29/2016    Class: Acute  . Peripheral vascular disease, unspecified ([place]) 07/12/2013  . Atherosclerosis of native arteries of the extremities with intermittent claudication 07/12/2013  . Chest pain, cardiac 06/08/2011    Class: Acute  . Diabetes mellitus ([place]) 06/08/2011    Class: History of  . HTN (hypertension), benign 06/08/2011    Class: Chronic  . Obesity (BMI 30-39.9) 06/08/2011    Class: History of  . Coronary artery disease 06/08/2011    Class: History of  Past Medical History:  Diagnosis Date  . Anginal pain ([place])   . Arthritis   . Asthma   . Coronary artery disease   . Diabetes mellitus   . Dyspnea   . Early cataracts, bilateral   . GERD (gastroesophageal reflux disease)    occ  . Hypertension   . Lung cancer ([place])   . Mass of upper lobe of left lung    mediastinal adenopathy  . Palpitations   . Pneumonia   . Stroke [facility])    mini stroke  . Wears dentures   . Wears glasses     Family History  Problem Relation Age of Onset  . Diabetes Mother   . Diabetes Sister   . Hyperlipidemia Sister   . Diabetes Brother   . Diabetes Son   . Cancer Neg Hx     Past Surgical History:  Procedure Laterality Date  . ABDOMINAL HYSTERECTOMY    .  ANKLE SURGERY Left    fusion  . COLONOSCOPY W/ BIOPSIES AND POLYPECTOMY    . IR ANGIO EXTRACRAN SEL COM CAROTID INNOMINATE UNI BILAT MOD SED  11/30/2018  . IR ANGIO VERTEBRAL SEL VERTEBRAL BILAT MOD SED  11/30/2018  . IR ANGIOGRAM SELECTIVE EACH ADDITIONAL VESSEL  11/30/2018  . MULTIPLE TOOTH EXTRACTIONS    . TOTAL KNEE ARTHROPLASTY Right   . VIDEO ASSISTED THORACOSCOPY (VATS)/ LOBECTOMY Left 04/21/2017   Procedure: VIDEO ASSISTED THORACOSCOPY (VATS)/LEFT UPPER LOBECTOMY;  Surgeon: Melrose Nakayama, MD;  Location: [place];  Service: Thoracic;  Laterality: Left;  Marland Kitchen VIDEO BRONCHOSCOPY WITH ENDOBRONCHIAL NAVIGATION N/A 01/21/2017   Procedure: VIDEO BRONCHOSCOPY WITH ENDOBRONCHIAL NAVIGATION;  Surgeon: Melrose Nakayama, MD;  Location: [place];  Service: Thoracic;  Laterality: N/A;  . VIDEO BRONCHOSCOPY WITH ENDOBRONCHIAL ULTRASOUND N/A 01/21/2017   Procedure: VIDEO BRONCHOSCOPY WITH ENDOBRONCHIAL ULTRASOUND;  Surgeon: Melrose Nakayama, MD;  Location: [facility];  Service: Thoracic;  Laterality: N/A;   Social History   Occupational History  . Not on file  Tobacco Use  . Smoking status: Former Smoker    Packs/day: 0.25    Years: 12.00    Pack years: 3.00    Types: Cigarettes    Quit date: 03/2017    Years since quitting: 1.9  . Smokeless tobacco: Never Used  Substance and Sexual Activity  . Alcohol use: No  . Drug use: No  . Sexual activity: Not Currently

## 2019-02-05 ENCOUNTER — Telehealth: Payer: Self-pay | Admitting: Family Medicine

## 2019-02-05 DIAGNOSIS — M25511 Pain in right shoulder: Secondary | ICD-10-CM

## 2019-02-05 DIAGNOSIS — M25561 Pain in right knee: Secondary | ICD-10-CM

## 2019-02-05 DIAGNOSIS — M25551 Pain in right hip: Secondary | ICD-10-CM

## 2019-02-05 NOTE — Telephone Encounter (Signed)
Please advise 

## 2019-02-05 NOTE — Telephone Encounter (Signed)
Left full message on patient's voice mail - [facility] will be calling her to set up an appointment.

## 2019-02-05 NOTE — Telephone Encounter (Signed)
PT referral ordered

## 2019-02-05 NOTE — Telephone Encounter (Signed)
Pt called in said she still isn't feeling any better is till having a lot of pain. Pt said dr.hilts told her to call in if she wasn't feeling any better so they can see what to do next.   [phone]

## 2019-02-08 ENCOUNTER — Ambulatory Visit: Payer: Medicare HMO | Admitting: Family Medicine

## 2019-02-08 ENCOUNTER — Telehealth: Payer: Self-pay | Admitting: Family Medicine

## 2019-02-08 NOTE — Telephone Encounter (Signed)
Left message on patient's voice mail to call back regarding the in home PT.

## 2019-02-08 NOTE — Telephone Encounter (Signed)
Order written

## 2019-02-08 NOTE — Telephone Encounter (Signed)
Patient called and stated that she will like orders sent to [facility] so she can do PT at home.  Call patient @ [phone]

## 2019-02-08 NOTE — Telephone Encounter (Signed)
Please advise on this. You were originally sending her to [facility].

## 2019-02-09 ENCOUNTER — Telehealth: Payer: Self-pay

## 2019-02-09 MED ORDER — TRAMADOL HCL 50 MG PO TABS: 25.0000 mg | ORAL_TABLET | Freq: Four times a day (QID) | ORAL | 0 refills | Status: DC | PRN

## 2019-02-09 NOTE — Addendum Note (Signed)
Addended by: Hortencia Pilar on: 02/09/2019 05:07 PM   Modules accepted: Orders

## 2019-02-09 NOTE — Telephone Encounter (Signed)
Rx sent.  Should use very cautiously.

## 2019-02-09 NOTE — Telephone Encounter (Signed)
Patient is requesting something for pain, but not something too strong. She is allergic to codeine. Is Tramadol something she could try? [facility] on file in chart. Please advise.

## 2019-02-12 ENCOUNTER — Telehealth: Payer: Self-pay | Admitting: Family Medicine

## 2019-02-12 NOTE — Telephone Encounter (Signed)
Patient called to stated had an allergic reaction to Tramadol. Patient itchy  and having to take Benadryl.   Please call patient [phone]

## 2019-02-12 NOTE — Telephone Encounter (Signed)
Left a full message on the voice mail at [facility] [phone]): patient has requested PT through this facility, as she has used them earlier this year when she came home from the hospital. I need to know if they can go out and evaluate the patient and possibly do PT, or if they only do PT on post op and recently hospitalized patients. Will await a return call on this.

## 2019-02-12 NOTE — Telephone Encounter (Signed)
I called the patient and got her voice mail. I left a message saying if she is having any swelling in the tongue or throat, or is having trouble breathing she should call 911 immediately. I will call her again in the next 1/2 hour to check on her.

## 2019-02-12 NOTE — Telephone Encounter (Signed)
Patient called me back. She started itching on Saturday and has been taking Benedryl since - helping. She is just taking ES Tylenol for pain now. I advised her there is not too much else to try for pain that does not have codeine or a codeine-derivative in it, and that is not so strong it puts her at a greater risk for a fall. She voiced understanding.

## 2019-02-12 NOTE — Telephone Encounter (Signed)
Patient has already picked up the medication and tried it, but stopped it due to itching (taking Benedryl for the itching). She will go back to taking ES Tylenol for the pain. See other message from 02/12/2019 on this.

## 2019-02-14 NOTE — Telephone Encounter (Signed)
I have gotten no return phone call from [facility]. I called the patient to ask if it was ok with her if we tried a different company. She has no problems with that, so the Rx, demographics and last ov note were faxed to [facility] [place].

## 2019-02-19 ENCOUNTER — Telehealth: Payer: Self-pay | Admitting: Family Medicine

## 2019-02-19 NOTE — Telephone Encounter (Signed)
Yes, that would be fine.

## 2019-02-19 NOTE — Telephone Encounter (Signed)
Verbal orders left on Christy's voice mail.

## 2019-02-19 NOTE — Telephone Encounter (Signed)
Received voicemail message from Anastasio Auerbach with [facility] needing verbal orders to continue HHPT 2 Wk 4 and 1 Wk 4. The number to contact Alyse Low is [phone]

## 2019-02-19 NOTE — Telephone Encounter (Signed)
OK 

## 2019-07-06 ENCOUNTER — Ambulatory Visit (HOSPITAL_COMMUNITY): Payer: Medicare HMO

## 2019-07-09 ENCOUNTER — Inpatient Hospital Stay: Payer: Medicare HMO | Attending: Internal Medicine

## 2019-07-09 ENCOUNTER — Ambulatory Visit (HOSPITAL_COMMUNITY)
Admission: RE | Admit: 2019-07-09 | Discharge: 2019-07-09 | Disposition: A | Discharge status: Home / Self Care | Payer: Medicare HMO | Source: Ambulatory Visit | Attending: Internal Medicine | Admitting: Internal Medicine

## 2019-07-09 ENCOUNTER — Other Ambulatory Visit: Payer: Self-pay | Admitting: Internal Medicine

## 2019-07-09 ENCOUNTER — Other Ambulatory Visit: Payer: Self-pay

## 2019-07-09 DIAGNOSIS — E119 Type 2 diabetes mellitus without complications: Secondary | ICD-10-CM | POA: Diagnosis not present

## 2019-07-09 DIAGNOSIS — C3490 Malignant neoplasm of unspecified part of unspecified bronchus or lung: Secondary | ICD-10-CM

## 2019-07-09 DIAGNOSIS — I10 Essential (primary) hypertension: Secondary | ICD-10-CM | POA: Insufficient documentation

## 2019-07-09 DIAGNOSIS — Z791 Long term (current) use of non-steroidal anti-inflammatories (NSAID): Secondary | ICD-10-CM | POA: Diagnosis not present

## 2019-07-09 DIAGNOSIS — Z902 Acquired absence of lung [part of]: Secondary | ICD-10-CM | POA: Diagnosis not present

## 2019-07-09 DIAGNOSIS — Z8673 Personal history of transient ischemic attack (TIA), and cerebral infarction without residual deficits: Secondary | ICD-10-CM | POA: Diagnosis not present

## 2019-07-09 DIAGNOSIS — Z7984 Long term (current) use of oral hypoglycemic drugs: Secondary | ICD-10-CM | POA: Diagnosis not present

## 2019-07-09 DIAGNOSIS — Z90710 Acquired absence of both cervix and uterus: Secondary | ICD-10-CM | POA: Diagnosis not present

## 2019-07-09 DIAGNOSIS — Z7982 Long term (current) use of aspirin: Secondary | ICD-10-CM | POA: Insufficient documentation

## 2019-07-09 DIAGNOSIS — Z79899 Other long term (current) drug therapy: Secondary | ICD-10-CM | POA: Diagnosis not present

## 2019-07-09 DIAGNOSIS — C3412 Malignant neoplasm of upper lobe, left bronchus or lung: Secondary | ICD-10-CM | POA: Diagnosis present

## 2019-07-09 LAB — CBC WITH DIFFERENTIAL (CANCER CENTER ONLY)
Abs Immature Granulocytes: 0.03 10*3/uL (ref 0.00–0.07)
Basophils Absolute: 0 10*3/uL (ref 0.0–0.1)
Basophils Relative: 0 %
Eosinophils Absolute: 0.2 10*3/uL (ref 0.0–0.5)
Eosinophils Relative: 4 %
HCT: 34.4 % — ABNORMAL LOW (ref 36.0–46.0)
Hemoglobin: 10.8 g/dL — ABNORMAL LOW (ref 12.0–15.0)
Immature Granulocytes: 1 %
Lymphocytes Relative: 36 %
Lymphs Abs: 1.8 10*3/uL (ref 0.7–4.0)
MCH: 26.3 pg (ref 26.0–34.0)
MCHC: 31.4 g/dL (ref 30.0–36.0)
MCV: 83.9 fL (ref 80.0–100.0)
Monocytes Absolute: 0.3 10*3/uL (ref 0.1–1.0)
Monocytes Relative: 6 %
Neutro Abs: 2.6 10*3/uL (ref 1.7–7.7)
Neutrophils Relative %: 53 %
Platelet Count: 263 10*3/uL (ref 150–400)
RBC: 4.1 MIL/uL (ref 3.87–5.11)
RDW: 15 % (ref 11.5–15.5)
WBC Count: 4.8 10*3/uL (ref 4.0–10.5)
nRBC: 0 % (ref 0.0–0.2)

## 2019-07-09 LAB — CMP (CANCER CENTER ONLY)
ALT: 6 U/L (ref 0–44)
AST: 12 U/L — ABNORMAL LOW (ref 15–41)
Albumin: 3.9 g/dL (ref 3.5–5.0)
Alkaline Phosphatase: 48 U/L (ref 38–126)
Anion gap: 8 (ref 5–15)
BUN: 22 mg/dL (ref 8–23)
CO2: 23 mmol/L (ref 22–32)
Calcium: 9 mg/dL (ref 8.9–10.3)
Chloride: 111 mmol/L (ref 98–111)
Creatinine: 1.16 mg/dL — ABNORMAL HIGH (ref 0.44–1.00)
GFR, Est AFR Am: 51 mL/min — ABNORMAL LOW (ref >60)
GFR, Estimated: 44 mL/min — ABNORMAL LOW (ref >60)
Glucose, Bld: 130 mg/dL — ABNORMAL HIGH (ref 70–99)
Potassium: 4 mmol/L (ref 3.5–5.1)
Sodium: 142 mmol/L (ref 135–145)
Total Bilirubin: 0.3 mg/dL (ref 0.3–1.2)
Total Protein: 7.6 g/dL (ref 6.5–8.1)

## 2019-07-09 MED ORDER — IOHEXOL 300 MG/ML  SOLN: 75.0000 mL | Freq: Once | INTRAMUSCULAR | Status: DC | PRN

## 2019-07-09 MED ORDER — SODIUM CHLORIDE (PF) 0.9 % IJ SOLN
INTRAMUSCULAR | Status: AC
  Filled 2019-07-09: qty 50

## 2019-07-11 ENCOUNTER — Inpatient Hospital Stay (HOSPITAL_BASED_OUTPATIENT_CLINIC_OR_DEPARTMENT_OTHER): Payer: Medicare HMO | Admitting: Internal Medicine

## 2019-07-11 ENCOUNTER — Encounter: Payer: Self-pay | Admitting: Internal Medicine

## 2019-07-11 ENCOUNTER — Encounter: Payer: Self-pay | Admitting: *Deleted

## 2019-07-11 ENCOUNTER — Other Ambulatory Visit: Payer: Self-pay

## 2019-07-11 VITALS — BP 152/64 | HR 59 | Temp 98.7°F | Resp 20 | Ht 69.0 in | Wt 181.9 lb

## 2019-07-11 DIAGNOSIS — C3490 Malignant neoplasm of unspecified part of unspecified bronchus or lung: Secondary | ICD-10-CM | POA: Diagnosis not present

## 2019-07-11 DIAGNOSIS — C3412 Malignant neoplasm of upper lobe, left bronchus or lung: Secondary | ICD-10-CM

## 2019-07-11 DIAGNOSIS — I10 Essential (primary) hypertension: Secondary | ICD-10-CM | POA: Diagnosis not present

## 2019-07-11 NOTE — Progress Notes (Signed)
[facility] [phone]   Fax:([phone]  OFFICE PROGRESS NOTE  Willey Blade, [place] [address]  DIAGNOSIS:  Stage IA (T1b, N0, M0) non-small cell lung cancer, squamous cell carcinoma presented with left upper lobe pulmonary nodule   PRIOR THERAPY: Status post left upper lobectomy with lymph node dissection under the care of Dr. Roxan Hockey on April 21, 2017.  CURRENT THERAPY: Observation.  INTERVAL HISTORY: [name] [name] 84 y.o. female returns to the clinic today for 6 months follow-up visit.  The patient is feeling fine today with no concerning complaints except for occasional shortness of breath.  She denied having any recent chest pain, cough or hemoptysis.  She denied having any recent weight loss or night sweats.  She has no nausea, vomiting, diarrhea or constipation.  She is here today for evaluation and repeat CT scan of the chest for restaging of her disease.  MEDICAL HISTORY: Past Medical History:  Diagnosis Date  . Anginal pain ([place])   . Arthritis   . Asthma   . Coronary artery disease   . Diabetes mellitus   . Dyspnea   . Early cataracts, bilateral   . GERD (gastroesophageal reflux disease)    occ  . Hypertension   . Lung cancer ([place])   . Mass of upper lobe of left lung    mediastinal adenopathy  . Palpitations   . Pneumonia   . Stroke [facility])    mini stroke  . Wears dentures   . Wears glasses     ALLERGIES:  is allergic to penicillins; tramadol; and codeine.  MEDICATIONS:  Current Outpatient Medications  Medication Sig Dispense Refill  . amLODipine (NORVASC) 10 MG tablet Take 10 mg by mouth daily.      Marland Kitchen AQUALANCE LANCETS 30G MISC     . aspirin EC 81 MG tablet Take 81 mg by mouth daily.      Marland Kitchen atorvastatin (LIPITOR) 20 MG tablet Take 20 mg by mouth daily at 6 PM.     . Blood Glucose Calibration (OT ULTRA/FASTTK CNTRL SOLN) SOLN     . Blood Glucose Monitoring Suppl (ONE  TOUCH ULTRA 2) w/Device KIT     . Cholecalciferol (VITAMIN D3) 125 MCG (5000 UT) TABS Take 5,000 Units by mouth daily.     . diclofenac sodium (VOLTAREN) 1 % GEL Apply 4 g topically 4 (four) times daily as needed. 500 g 6  . hydrALAZINE (APRESOLINE) 25 MG tablet Take 25 mg by mouth 2 (two) times daily.     . isosorbide dinitrate (ISORDIL) 20 MG tablet Take 20 mg by mouth 2 (two) times daily.     Marland Kitchen ketotifen (ZADITOR) 0.025 % ophthalmic solution Place 1 drop into both eyes as needed (itchy eyes).    Elmore Guise Devices (ADJUSTABLE LANCING DEVICE) MISC     . meclizine (ANTIVERT) 25 MG tablet Take 25 mg by mouth 2 (two) times daily as needed for dizziness.    . metFORMIN (GLUCOPHAGE) 500 MG tablet Take 500 mg by mouth 2 (two) times daily.     . metoprolol succinate (TOPROL-XL) 25 MG 24 hr tablet Take 25 mg by mouth every evening.     . nitroGLYCERIN (NITROSTAT) 0.4 MG SL tablet Place 0.4 mg under the tongue every 5 (five) minutes as needed for chest pain.    Marland Kitchen omeprazole (PRILOSEC) 20 MG capsule Take 20 mg by mouth daily.    . ONE TOUCH ULTRA  TEST test strip     . pioglitazone (ACTOS) 30 MG tablet Take 30 mg by mouth every evening.     . temazepam (RESTORIL) 15 MG capsule Take 15 mg by mouth at bedtime as needed.     . traMADol (ULTRAM) 50 MG tablet Take 0.5-1 tablets (25-50 mg total) by mouth every 6 (six) hours as needed. 20 tablet 0  . valsartan (DIOVAN) 160 MG tablet Take 160 mg by mouth daily.   5   No current facility-administered medications for this visit.     SURGICAL HISTORY:  Past Surgical History:  Procedure Laterality Date  . ABDOMINAL HYSTERECTOMY    . ANKLE SURGERY Left    fusion  . COLONOSCOPY W/ BIOPSIES AND POLYPECTOMY    . IR ANGIO EXTRACRAN SEL COM CAROTID INNOMINATE UNI BILAT MOD SED  11/30/2018  . IR ANGIO VERTEBRAL SEL VERTEBRAL BILAT MOD SED  11/30/2018  . IR ANGIOGRAM SELECTIVE EACH ADDITIONAL VESSEL  11/30/2018  . MULTIPLE TOOTH EXTRACTIONS    . TOTAL KNEE  ARTHROPLASTY Right   . VIDEO ASSISTED THORACOSCOPY (VATS)/ LOBECTOMY Left 04/21/2017   Procedure: VIDEO ASSISTED THORACOSCOPY (VATS)/LEFT UPPER LOBECTOMY;  Surgeon: Melrose Nakayama, MD;  Location: [facility];  Service: Thoracic;  Laterality: Left;  Marland Kitchen VIDEO BRONCHOSCOPY WITH ENDOBRONCHIAL NAVIGATION N/A 01/21/2017   Procedure: VIDEO BRONCHOSCOPY WITH ENDOBRONCHIAL NAVIGATION;  Surgeon: Melrose Nakayama, MD;  Location: [facility];  Service: Thoracic;  Laterality: N/A;  . VIDEO BRONCHOSCOPY WITH ENDOBRONCHIAL ULTRASOUND N/A 01/21/2017   Procedure: VIDEO BRONCHOSCOPY WITH ENDOBRONCHIAL ULTRASOUND;  Surgeon: Melrose Nakayama, MD;  Location: [facility];  Service: Thoracic;  Laterality: N/A;    REVIEW OF SYSTEMS:  A comprehensive review of systems was negative except for: Respiratory: positive for dyspnea on exertion   PHYSICAL EXAMINATION: General appearance: alert, cooperative and no distress Head: Normocephalic, without obvious abnormality, atraumatic Neck: no adenopathy, no JVD, supple, symmetrical, trachea midline and thyroid not enlarged, symmetric, no tenderness/mass/nodules Lymph nodes: Cervical, supraclavicular, and axillary nodes normal. Resp: clear to auscultation bilaterally Back: symmetric, no curvature. ROM normal. No CVA tenderness. Cardio: regular rate and rhythm, S1, S2 normal, no murmur, click, rub or gallop GI: soft, non-tender; bowel sounds normal; no masses,  no organomegaly Extremities: extremities normal, atraumatic, no cyanosis or edema  ECOG PERFORMANCE STATUS: 1 - Symptomatic but completely ambulatory  Blood pressure (!) 152/64, pulse (!) 59, temperature 98.7 F (37.1 C), temperature source Oral, resp. rate 20, height 5' 9"  (1.753 m), weight 181 lb 14.4 oz (82.5 kg), SpO2 98 %.  LABORATORY DATA: Lab Results  Component Value Date   WBC 4.8 07/09/2019   HGB 10.8 (L) 07/09/2019   HCT 34.4 (L) 07/09/2019   MCV 83.9 07/09/2019   PLT 263 07/09/2019      Chemistry       Component Value Date/Time   NA 142 07/09/2019 1012   NA 140 06/02/2017 1322   K 4.0 07/09/2019 1012   K 4.2 06/02/2017 1322   CL 111 07/09/2019 1012   CO2 23 07/09/2019 1012   CO2 20 (L) 06/02/2017 1322   BUN 22 07/09/2019 1012   BUN 22.9 06/02/2017 1322   CREATININE 1.16 (H) 07/09/2019 1012   CREATININE 1.2 (H) 06/02/2017 1322      Component Value Date/Time   CALCIUM 9.0 07/09/2019 1012   CALCIUM 9.3 06/02/2017 1322   ALKPHOS 48 07/09/2019 1012   ALKPHOS 48 06/02/2017 1322   AST 12 (L) 07/09/2019 1012   AST 11 06/02/2017 1322  ALT 6 07/09/2019 1012   ALT 6 06/02/2017 1322   BILITOT 0.3 07/09/2019 1012   BILITOT 0.36 06/02/2017 1322       RADIOGRAPHIC STUDIES: Ct Chest Wo Contrast  Result Date: 07/09/2019 CLINICAL DATA:  Restaging non-small cell lung cancer. EXAM: CT CHEST WITHOUT CONTRAST TECHNIQUE: Multidetector CT imaging of the chest was performed following the standard protocol without IV contrast. COMPARISON:  01/05/2019 FINDINGS: Cardiovascular: Normal heart size. Aortic atherosclerosis. No pericardial effusion. Coronary artery calcifications involving RCA, LAD and left circumflex. Mediastinum/Nodes: Normal appearance of the thyroid gland. The trachea appears patent and is midline. Normal appearance of the esophagus. The index right paratracheal lymph node is unchanged measuring 1.5 cm, image 52/2. Stable 1.2 cm subcarinal lymph node, image 57/2. No new or progressive adenopathy identified. Lungs/Pleura: Advanced change of centrilobular and paraseptal emphysema. No pleural effusion, airspace consolidation or atelectasis. Left upper lobectomy. Index subpleural nodule in the medial right upper lobe measures 5 mm and is unchanged from previous exam, image 50/7. Calcified granulomas identified within the right upper lobe. Tiny perifissural nodule in the right middle lobe is unchanged measuring 2 mm, image 67/100. Upper Abdomen: No acute abnormality. Cholecystectomy. Aortic  atherosclerosis. Musculoskeletal: Multilevel degenerative disc disease. No acute or significant osseous findings. IMPRESSION: 1. No acute cardiopulmonary abnormalities. Status post left upper lobectomy. No findings to suggest recurrent tumor or metastatic disease within the chest. 2. Stable appearance of borderline enlarged mediastinal lymph nodes. 3. Emphysema and aortic atherosclerosis. Multi vessel coronary artery calcifications noted. Aortic Atherosclerosis (ICD10-I70.0) and Emphysema (ICD10-J43.9). Electronically Signed   By: Kerby Moors M.D.   On: 07/09/2019 14:20    ASSESSMENT AND PLAN: This is a very pleasant 84 years old African-American female with a stage Ia non-small cell lung cancer status post left upper lobectomy with lymph node dissection.   She is currently on observation and feeling fine with no concerning issues. She had repeat CT scan of the chest performed recently.  I personally and independently reviewed the scans and discussed the results with the patient today. Her scan showed no concerning findings for disease recurrence or metastasis. I recommended for the patient to continue on observation with repeat CT scan of the chest in 1 year. For hypertension she was advised to take her blood pressure medication as prescribed and to monitor it closely at home. She was advised to call immediately if she has any concerning symptoms in the interval. The patient voices understanding of current disease status and treatment options and is in agreement with the current care plan. All questions were answered. The patient knows to call the clinic with any problems, questions or concerns. We can certainly see the patient much sooner if necessary.  I spent 10 minutes counseling the patient face to face. The total time spent in the appointment was 15 minutes.  Disclaimer: This note was dictated with voice recognition software. Similar sounding words can inadvertently be transcribed and may  not be corrected upon review.

## 2019-07-11 NOTE — Progress Notes (Signed)
Oncology Nurse Navigator Documentation  Oncology Nurse Navigator Flowsheets 07/11/2019  Navigator Location [facility]  Referral Date to RadOnc/MedOnc -  Navigator Encounter Type Clinic/MDC/I spoke with patient today at clinic.  She is doing well without complaints.  I help to explain plan of care with follow up in one year with scan.    Telephone -  [facility] Date -  Multidisiplinary Clinic Type -  Patient Visit Type MedOnc  Treatment Phase Follow-up  Barriers/Navigation Needs Education  Education Other  Interventions Education  Acuity Level 2-Minimal Needs (1-2 Barriers Identified)  Coordination of Care -  Education Method Verbal  Time Spent with Patient 15

## 2019-07-12 ENCOUNTER — Telehealth: Payer: Self-pay | Admitting: Internal Medicine

## 2019-07-12 NOTE — Telephone Encounter (Signed)
Scheduled per los. Called and left msg. Mailed printout  °

## 2019-08-24 ENCOUNTER — Other Ambulatory Visit: Payer: Self-pay | Admitting: Physician Assistant

## 2019-08-24 DIAGNOSIS — I609 Nontraumatic subarachnoid hemorrhage, unspecified: Secondary | ICD-10-CM

## 2019-08-28 ENCOUNTER — Ambulatory Visit
Admission: RE | Admit: 2019-08-28 | Discharge: 2019-08-28 | Disposition: A | Discharge status: Home / Self Care | Payer: Medicare HMO | Source: Ambulatory Visit | Attending: Physician Assistant | Admitting: Physician Assistant

## 2019-08-28 DIAGNOSIS — I609 Nontraumatic subarachnoid hemorrhage, unspecified: Secondary | ICD-10-CM

## 2019-10-30 NOTE — Telephone Encounter (Signed)
disregard

## 2019-11-29 ENCOUNTER — Encounter: Payer: Self-pay | Admitting: Family Medicine

## 2019-11-29 ENCOUNTER — Ambulatory Visit (INDEPENDENT_AMBULATORY_CARE_PROVIDER_SITE_OTHER): Payer: Medicare HMO

## 2019-11-29 ENCOUNTER — Ambulatory Visit: Payer: Medicare HMO | Admitting: Family Medicine

## 2019-11-29 ENCOUNTER — Other Ambulatory Visit: Payer: Self-pay

## 2019-11-29 DIAGNOSIS — M25562 Pain in left knee: Secondary | ICD-10-CM | POA: Diagnosis not present

## 2019-11-29 DIAGNOSIS — M542 Cervicalgia: Secondary | ICD-10-CM

## 2019-11-29 MED ORDER — METHYLPREDNISOLONE 4 MG PO TBPK: ORAL_TABLET | ORAL | 0 refills | Status: DC

## 2019-11-29 NOTE — Progress Notes (Signed)
Rightsided neck pain with pain into the shoulder. No numbness or tingling in right arm Was placed on tizaidine which helps with pain some   Bilateral knee pain. Right knee has been replaced. No recent falls but is a fall risk

## 2019-11-29 NOTE — Progress Notes (Signed)
Office Visit Note   Patient: [name] [name]           Date of Birth: 03-28-1935           MRN: [id number] Visit Date: 11/29/2019 Requested by: Willey Blade, [address],  [place] [address] PCP: Willey Blade, MD  Subjective: Chief Complaint  Patient presents with  . Neck - Pain  . Left Knee - Pain    HPI: She is here with neck pain and left knee pain.  No injury, symptoms started a few weeks ago.  Pain in the neck with radiation into the shoulder.  Pain in the left knee diffusely, with some swelling and pain when walking.  No locking or giving way.              ROS:   All other systems were reviewed and are negative.  Objective: Vital Signs: There were no vitals taken for this visit.  Physical Exam:  General:  Alert and oriented, in no acute distress. Pulm:  Breathing unlabored. Psy:  Normal mood, congruent affect. Skin: No rash or erythema Neck: She has slightly limited range of motion in all directions.  She has tender trapezius muscles on the right.  Upper extremity strength and reflexes are normal. Left knee: 1+ effusion with no warmth.  Moderately tender on the medial and lateral joint lines.  Full active extension and full extension and flexion of 125 degrees.  Imaging: XR Knee 1-2 Views Left  Result Date: 11/29/2019 X-rays left knee: She has moderate to severe tricompartmental DJD with osteopenic appearance of her bones.  Right knee replacement on the AP view looks intact with no sign of loosening.  XR Cervical Spine 2 or 3 views  Result Date: 11/29/2019 Cervical spine: She has moderate C5-6 degenerative disc disease and mild at multiple other levels.  Mild to moderate facet DJD at multiple levels.  No sign of compression deformity or neoplasm.   Assessment & Plan: 1.  Neck pain with underlying cervical spondylosis, nonfocal neurologic exam. -Trial of home physical therapy. -Medrol Dosepak, watch blood sugars closely.  2.  Left  knee DJD with effusion -Medrol Dosepak.  Home health physical therapy.  Aspiration and injection if symptoms persist.     Procedures: No procedures performed  No notes on file     PMFS History: Patient Active Problem List   Diagnosis Date Noted  . Hypertensive emergency 12/02/2018  . Subarachnoid hemorrhage ([place]) 11/29/2018  . S/P lobectomy of lung 04/21/2017  . 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 ([place]) 02/16/2017  . Solitary pulmonary nodule 02/08/2017  . Shortness of breath 12/29/2016    Class: Acute  . Peripheral vascular disease, unspecified ([place]) 07/12/2013  . Atherosclerosis of native arteries of the extremities with intermittent claudication 07/12/2013  . Chest pain, cardiac 06/08/2011    Class: Acute  . Diabetes mellitus ([place]) 06/08/2011    Class: History of  . HTN (hypertension), benign 06/08/2011    Class: Chronic  . Obesity (BMI 30-39.9) 06/08/2011    Class: History of  . Coronary artery disease 06/08/2011    Class: History of   Past Medical History:  Diagnosis Date  . Anginal pain ([place])   . Arthritis   . Asthma   . Coronary artery disease   . Diabetes mellitus   . Dyspnea   . Early cataracts, bilateral   . GERD (gastroesophageal reflux disease)    occ  . Hypertension   .  Lung cancer ([place])   . Mass of upper lobe of left lung    mediastinal adenopathy  . Palpitations   . Pneumonia   . Stroke [facility])    mini stroke  . Wears dentures   . Wears glasses     Family History  Problem Relation Age of Onset  . Diabetes Mother   . Diabetes Sister   . Hyperlipidemia Sister   . Diabetes Brother   . Diabetes Son   . Cancer Neg Hx     Past Surgical History:  Procedure Laterality Date  . ABDOMINAL HYSTERECTOMY    . ANKLE SURGERY Left    fusion  . COLONOSCOPY W/ BIOPSIES AND POLYPECTOMY    . IR ANGIO EXTRACRAN SEL COM CAROTID INNOMINATE UNI BILAT MOD SED  11/30/2018  . IR ANGIO VERTEBRAL SEL VERTEBRAL BILAT MOD SED  11/30/2018  .  IR ANGIOGRAM SELECTIVE EACH ADDITIONAL VESSEL  11/30/2018  . MULTIPLE TOOTH EXTRACTIONS    . TOTAL KNEE ARTHROPLASTY Right   . VIDEO ASSISTED THORACOSCOPY (VATS)/ LOBECTOMY Left 04/21/2017   Procedure: VIDEO ASSISTED THORACOSCOPY (VATS)/LEFT UPPER LOBECTOMY;  Surgeon: Melrose Nakayama, MD;  Location: [facility];  Service: Thoracic;  Laterality: Left;  Marland Kitchen VIDEO BRONCHOSCOPY WITH ENDOBRONCHIAL NAVIGATION N/A 01/21/2017   Procedure: VIDEO BRONCHOSCOPY WITH ENDOBRONCHIAL NAVIGATION;  Surgeon: Melrose Nakayama, MD;  Location: [facility];  Service: Thoracic;  Laterality: N/A;  . VIDEO BRONCHOSCOPY WITH ENDOBRONCHIAL ULTRASOUND N/A 01/21/2017   Procedure: VIDEO BRONCHOSCOPY WITH ENDOBRONCHIAL ULTRASOUND;  Surgeon: Melrose Nakayama, MD;  Location: [facility];  Service: Thoracic;  Laterality: N/A;   Social History   Occupational History  . Not on file  Tobacco Use  . Smoking status: Former Smoker    Packs/day: 0.25    Years: 12.00    Pack years: 3.00    Types: Cigarettes    Quit date: 03/2017    Years since quitting: 2.7  . Smokeless tobacco: Never Used  Substance and Sexual Activity  . Alcohol use: No  . Drug use: No  . Sexual activity: Not Currently

## 2019-12-07 ENCOUNTER — Encounter: Payer: Self-pay | Admitting: Family Medicine

## 2019-12-07 ENCOUNTER — Other Ambulatory Visit: Payer: Self-pay

## 2019-12-07 ENCOUNTER — Ambulatory Visit: Payer: Medicare HMO | Admitting: Family Medicine

## 2019-12-07 DIAGNOSIS — M25562 Pain in left knee: Secondary | ICD-10-CM

## 2019-12-07 DIAGNOSIS — E559 Vitamin D deficiency, unspecified: Secondary | ICD-10-CM | POA: Insufficient documentation

## 2019-12-07 DIAGNOSIS — I1310 Hypertensive heart and chronic kidney disease without heart failure, with stage 1 through stage 4 chronic kidney disease, or unspecified chronic kidney disease: Secondary | ICD-10-CM | POA: Insufficient documentation

## 2019-12-07 DIAGNOSIS — F3289 Other specified depressive episodes: Secondary | ICD-10-CM | POA: Insufficient documentation

## 2019-12-07 DIAGNOSIS — F17200 Nicotine dependence, unspecified, uncomplicated: Secondary | ICD-10-CM | POA: Insufficient documentation

## 2019-12-07 DIAGNOSIS — J309 Allergic rhinitis, unspecified: Secondary | ICD-10-CM | POA: Insufficient documentation

## 2019-12-07 DIAGNOSIS — E663 Overweight: Secondary | ICD-10-CM | POA: Insufficient documentation

## 2019-12-07 DIAGNOSIS — E785 Hyperlipidemia, unspecified: Secondary | ICD-10-CM | POA: Insufficient documentation

## 2019-12-07 NOTE — Progress Notes (Signed)
Office Visit Note   Patient: [name] [name]           Date of Birth: 1934/12/31           MRN: [id number] Visit Date: 12/07/2019 Requested by: Willey Blade, [address] [place],  [place] [address] PCP: Willey Blade, MD  Subjective: Chief Complaint  Patient presents with  . Left Knee - Pain    Persistent pain and swelling in the left knee. Difficulty with getting in/out of her high bed and high bathtub. Walking with a cane.    HPI: She is here with persistent left knee pain.  No improvement with prednisone.  Aching and throbbing, remains stiff and swollen.  Difficulty transitioning from sitting to standing.              ROS: No fevers or chills.  All other systems were reviewed and are negative.  Objective: Vital Signs: There were no vitals taken for this visit.  Physical Exam:  General:  Alert and oriented, in no acute distress. Pulm:  Breathing unlabored. Psy:  Normal mood, congruent affect. Skin: No erythema or rash. Left knee: 1+ effusion with no warmth.  Tender on the medial and lateral joint lines.  Imaging: No results found.  Assessment & Plan: 1.  Left knee DJD with effusion -Discussed options with her and elected to aspirate and inject today.  Follow-up as needed.     Procedures: Left knee aspiration and injection: After sterile prep with Betadine, injected 5 cc 1% lidocaine without epinephrine and then aspirated 15 cc clear yellow synovial fluid, then injected 40 mg methylprednisolone from superolateral approach.    PMFS History: Patient Active Problem List   Diagnosis Date Noted  . Allergic rhinitis 12/07/2019  . 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 Hyperlipidemia 12/07/2019  . Hypertensive heart and renal disease 12/07/2019  . Overweight 12/07/2019  . Smoker 12/07/2019  . Vitamin D deficiency 12/07/2019  . Hypertensive emergency 12/02/2018  . Subarachnoid hemorrhage ([place]) 11/29/2018  . S/P lobectomy of lung  04/21/2017  . 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 ([facility]) 02/16/2017  . Solitary pulmonary nodule 02/08/2017  . Shortness of breath 12/29/2016    Class: Acute  . Gastroesophageal reflux disease 03/04/2015  . Peripheral vascular disease, unspecified ([place]) 07/12/2013  . Atherosclerosis of native arteries of the extremities with intermittent claudication 07/12/2013  . Chest pain, cardiac 06/08/2011    Class: Acute  . Diabetes mellitus ([facility]) 06/08/2011    Class: History of  . HTN (hypertension), benign 06/08/2011    Class: Chronic  . Obesity (BMI 30-39.9) 06/08/2011    Class: History of  . Coronary artery disease 06/08/2011    Class: History of   Past Medical History:  Diagnosis Date  . Anginal pain ([place])   . Arthritis   . Asthma   . Coronary artery disease   . Diabetes mellitus   . Dyspnea   . Early cataracts, bilateral   . GERD (gastroesophageal reflux disease)    occ  . Hypertension   . Lung cancer ([place])   . Mass of upper lobe of left lung    mediastinal adenopathy  . Palpitations   . Pneumonia   . Stroke [facility])    mini stroke  . Wears dentures   . Wears glasses     Family History  Problem Relation Age of Onset  . Diabetes Mother   . Diabetes Sister   . Hyperlipidemia Sister   . Diabetes  Brother   . Diabetes Son   . Cancer Neg Hx     Past Surgical History:  Procedure Laterality Date  . ABDOMINAL HYSTERECTOMY    . ANKLE SURGERY Left    fusion  . COLONOSCOPY W/ BIOPSIES AND POLYPECTOMY    . IR ANGIO EXTRACRAN SEL COM CAROTID INNOMINATE UNI BILAT MOD SED  11/30/2018  . IR ANGIO VERTEBRAL SEL VERTEBRAL BILAT MOD SED  11/30/2018  . IR ANGIOGRAM SELECTIVE EACH ADDITIONAL VESSEL  11/30/2018  . MULTIPLE TOOTH EXTRACTIONS    . TOTAL KNEE ARTHROPLASTY Right   . VIDEO ASSISTED THORACOSCOPY (VATS)/ LOBECTOMY Left 04/21/2017   Procedure: VIDEO ASSISTED THORACOSCOPY (VATS)/LEFT UPPER LOBECTOMY;  Surgeon: Melrose Nakayama, MD;  Location: [facility];   Service: Thoracic;  Laterality: Left;  Marland Kitchen VIDEO BRONCHOSCOPY WITH ENDOBRONCHIAL NAVIGATION N/A 01/21/2017   Procedure: VIDEO BRONCHOSCOPY WITH ENDOBRONCHIAL NAVIGATION;  Surgeon: Melrose Nakayama, MD;  Location: [facility];  Service: Thoracic;  Laterality: N/A;  . VIDEO BRONCHOSCOPY WITH ENDOBRONCHIAL ULTRASOUND N/A 01/21/2017   Procedure: VIDEO BRONCHOSCOPY WITH ENDOBRONCHIAL ULTRASOUND;  Surgeon: Melrose Nakayama, MD;  Location: [facility];  Service: Thoracic;  Laterality: N/A;   Social History   Occupational History  . Not on file  Tobacco Use  . Smoking status: Former Smoker    Packs/day: 0.25    Years: 12.00    Pack years: 3.00    Types: Cigarettes    Quit date: 03/2017    Years since quitting: 2.7  . Smokeless tobacco: Never Used  Substance and Sexual Activity  . Alcohol use: No  . Drug use: No  . Sexual activity: Not Currently

## 2019-12-24 ENCOUNTER — Telehealth: Payer: Self-pay | Admitting: Family Medicine

## 2019-12-24 NOTE — Telephone Encounter (Signed)
[place] from [email or web address]  called to report pt cancelled physical therapy this week and would like to resume physical therapy next week. Patient only ad one physicaly therapy session. [facility] phone number is [phone] if any questions or concerns.

## 2019-12-25 NOTE — Telephone Encounter (Signed)
Ok to approve 

## 2019-12-25 NOTE — Telephone Encounter (Signed)
Hilts patient.

## 2019-12-25 NOTE — Telephone Encounter (Signed)
I called [facility]: ok per Dr. Junius Roads.

## 2019-12-26 ENCOUNTER — Telehealth: Payer: Self-pay | Admitting: Family Medicine

## 2019-12-26 MED ORDER — DICLOFENAC SODIUM 25 MG PO TBEC: 25.0000 mg | DELAYED_RELEASE_TABLET | Freq: Two times a day (BID) | ORAL | 3 refills | Status: DC | PRN

## 2019-12-26 NOTE — Telephone Encounter (Signed)
Patient called. She would like to know if she can have the pill form of the diclofenac. Her call back number is [phone]

## 2019-12-26 NOTE — Telephone Encounter (Signed)
I called and left a voice mail with this information. Advised her to call back if she has any questions/concerns.

## 2019-12-26 NOTE — Telephone Encounter (Signed)
Please advise 

## 2019-12-26 NOTE — Telephone Encounter (Signed)
Rx sent, but should use sparingly.  It can cause stomach ulcers, injury to kidneys with long-term use.

## 2020-02-18 ENCOUNTER — Other Ambulatory Visit: Payer: Self-pay | Admitting: Family Medicine

## 2020-02-18 MED ORDER — DICLOFENAC SODIUM 1 % EX GEL
4.0000 g | Freq: Four times a day (QID) | CUTANEOUS | 6 refills | Status: DC | PRN
Start: 2020-02-18 — End: 2021-01-13

## 2020-03-11 ENCOUNTER — Other Ambulatory Visit: Payer: Self-pay

## 2020-03-11 ENCOUNTER — Ambulatory Visit: Payer: Medicare HMO | Admitting: Family Medicine

## 2020-03-11 ENCOUNTER — Ambulatory Visit (INDEPENDENT_AMBULATORY_CARE_PROVIDER_SITE_OTHER): Payer: Medicare HMO

## 2020-03-11 ENCOUNTER — Encounter: Payer: Self-pay | Admitting: Family Medicine

## 2020-03-11 DIAGNOSIS — M542 Cervicalgia: Secondary | ICD-10-CM

## 2020-03-11 DIAGNOSIS — M25572 Pain in left ankle and joints of left foot: Secondary | ICD-10-CM | POA: Diagnosis not present

## 2020-03-11 DIAGNOSIS — M25511 Pain in right shoulder: Secondary | ICD-10-CM | POA: Diagnosis not present

## 2020-03-11 NOTE — Progress Notes (Signed)
I saw and examined the patient with Dr. Elouise Munroe and agree with assessment and plan as outlined.    Ongoing neck pain with myofascial trigger points.  Old left ankle trauma with recent onset of lateral symptoms.  X-Rays show talar dome lesion laterally, with DJD.  Will try PT here in our office.  ASO brace for ankle.  Voltaren gel as needed.  Ankle injection if becomes more symptomatic.

## 2020-03-11 NOTE — Progress Notes (Signed)
Office Visit Note   Patient: [name] [name]           Date of Birth: 08-26-1934           MRN: [id number] Visit Date: 03/11/2020 Requested by: Willey Blade, [address] [place],  [place] [address] PCP: Willey Blade, MD  Subjective: Chief Complaint  Patient presents with  . Neck - Pain  . Right Shoulder - Pain  . Left Ankle - Pain    HPI: 85yo F presenting to clinic to follow up on chronic neck pain, as well as with concerns of ongoing left ankle pain. Patient states that she has been trying to do her home exercises for her neck, but continues to feel discomfort in her right trapezius and will occasionally get right-sided headaches when her neck pain is severe. She states that, twice while working with her knee, her physical therapist was willing to help massage her neck, which she found helpful. Pain does not radiate into her arm, and is not associated with numbness/tingling in her hands. She is curious to know if a new pillow might offer some improvement.  Per her ankle, she states it has been insidiously worsening for some time, but she noticed a few weeks ago that it started to swell and she became concerned. She states she hasn't done much for self-care thus far for her ankle. She denies any recent trauma, though does state that several years ago she fell in a hole with that leg, and had immediate pain for several weeks afterwards. She states that her ankle feels stiff. It has not been bruised, but it has been significantly swollen.                 ROS:   All other systems were reviewed and are negative.  Objective: Vital Signs: There were no vitals taken for this visit.  Physical Exam:  General:  Alert and oriented, in no acute distress. Pulm:  Breathing unlabored. Psy:  Normal mood, congruent affect. Skin:  No bruising or rashes appreciated.   NECK: ROM slightly limited in all directions, with tenderness to palpation of right superior trapezius  musculature. Full strength and sensation in R arm. No midline tenderness on cervical spine, no stepoffs. Tenderness to palpation to cervical paraspinal muscles, with tenderpoints appreciated in R suboccipital musculature.  Left Ankle:  Obvious swelling appreciated over lateral aspect. Tenderness to palpation over lateral and medial malleoli. No Tenderness with palpation of anterior joint line, or over midfoot or 5th metatarsal. ROM of ankle restricted throughout all directions, with crepitus appreciated.    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  There is some collapse and fragmentation.  No definite loose body seen.  There is midfoot DJD as well.  Achilles insertion spur and plantar fascia traction spur on calcaneus.     Assessment & Plan: NECK PAIN:  Chronic right-sided neck pain, with underlying cervical spondylosis and DJD.  - Patient agreeable to course of physical therapy with dedicated attention to her neck. 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  - Fitted with ankle brace to support the joint today. Patient also interested in physical therapy for this area as well, and is willing to travel to PT clinic for continuity  of care. Will place referral to [facility].  - If no improvement with PT, Bracing, or Voltaren, RTC for reevaluation and consideration of ankle injection therapy.  - Patient had no further questions or concerns today.      Procedures: No procedures performed  No notes on file     PMFS History: Patient Active Problem List   Diagnosis Date Noted  . Allergic rhinitis 12/07/2019  . 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 Hyperlipidemia 12/07/2019  . Hypertensive heart and renal disease 12/07/2019  . Overweight 12/07/2019  . Smoker  12/07/2019  . Vitamin D deficiency 12/07/2019  . Hypertensive emergency 12/02/2018  . Subarachnoid hemorrhage ([place]) 11/29/2018  . S/P lobectomy of lung 04/21/2017  . 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 ([place]) 02/16/2017  . Solitary pulmonary nodule 02/08/2017  . Shortness of breath 12/29/2016    Class: Acute  . Gastroesophageal reflux disease 03/04/2015  . Peripheral vascular disease, unspecified ([place]) 07/12/2013  . Atherosclerosis of native arteries of the extremities with intermittent claudication 07/12/2013  . Chest pain, cardiac 06/08/2011    Class: Acute  . Diabetes mellitus ([place]) 06/08/2011    Class: History of  . HTN (hypertension), benign 06/08/2011    Class: Chronic  . Obesity (BMI 30-39.9) 06/08/2011    Class: History of  . Coronary artery disease 06/08/2011    Class: History of   Past Medical History:  Diagnosis Date  . Anginal pain ([place])   . Arthritis   . Asthma   . Coronary artery disease   . Diabetes mellitus   . Dyspnea   . Early cataracts, bilateral   . GERD (gastroesophageal reflux disease)    occ  . Hypertension   . Lung cancer ([place])   . Mass of upper lobe of left lung    mediastinal adenopathy  . Palpitations   . Pneumonia   . Stroke [facility])    mini stroke  . Wears dentures   . Wears glasses     Family History  Problem Relation Age of Onset  . Diabetes Mother   . Diabetes Sister   . Hyperlipidemia Sister   . Diabetes Brother   . Diabetes Son   . Cancer Neg Hx     Past Surgical History:  Procedure Laterality Date  . ABDOMINAL HYSTERECTOMY    . ANKLE SURGERY Left    fusion  . COLONOSCOPY W/ BIOPSIES AND POLYPECTOMY    . IR ANGIO EXTRACRAN SEL COM CAROTID INNOMINATE UNI BILAT MOD SED  11/30/2018  . IR ANGIO VERTEBRAL SEL VERTEBRAL BILAT MOD SED  11/30/2018  . IR ANGIOGRAM SELECTIVE EACH ADDITIONAL VESSEL  11/30/2018  . MULTIPLE TOOTH EXTRACTIONS    . TOTAL KNEE ARTHROPLASTY Right   . VIDEO ASSISTED THORACOSCOPY (VATS)/  LOBECTOMY Left 04/21/2017   Procedure: VIDEO ASSISTED THORACOSCOPY (VATS)/LEFT UPPER LOBECTOMY;  Surgeon: Melrose Nakayama, MD;  Location: [place];  Service: Thoracic;  Laterality: Left;  Marland Kitchen VIDEO BRONCHOSCOPY WITH ENDOBRONCHIAL NAVIGATION N/A 01/21/2017   Procedure: VIDEO BRONCHOSCOPY WITH ENDOBRONCHIAL NAVIGATION;  Surgeon: Melrose Nakayama, MD;  Location: [place];  Service: Thoracic;  Laterality: N/A;  . VIDEO BRONCHOSCOPY WITH ENDOBRONCHIAL ULTRASOUND N/A 01/21/2017   Procedure: VIDEO BRONCHOSCOPY WITH ENDOBRONCHIAL ULTRASOUND;  Surgeon: Melrose Nakayama, MD;  Location: [place];  Service: Thoracic;  Laterality: N/A;   Social History   Occupational History  . Not on file  Tobacco Use  . Smoking status: Former Smoker    Packs/day: 0.25    Years:  12.00    Pack years: 3.00    Types: Cigarettes    Quit date: 03/2017    Years since quitting: 3.0  . Smokeless tobacco: Never Used  Vaping Use  . Vaping Use: Never used  Substance and Sexual Activity  . Alcohol use: No  . Drug use: No  . Sexual activity: Not Currently

## 2020-03-21 ENCOUNTER — Other Ambulatory Visit: Payer: Self-pay

## 2020-03-21 ENCOUNTER — Ambulatory Visit: Payer: Medicare HMO | Admitting: Rehabilitative and Restorative Service Providers"

## 2020-03-21 ENCOUNTER — Encounter: Payer: Self-pay | Admitting: Rehabilitative and Restorative Service Providers"

## 2020-03-21 DIAGNOSIS — M6281 Muscle weakness (generalized): Secondary | ICD-10-CM

## 2020-03-21 DIAGNOSIS — M542 Cervicalgia: Secondary | ICD-10-CM

## 2020-03-21 DIAGNOSIS — M25572 Pain in left ankle and joints of left foot: Secondary | ICD-10-CM

## 2020-03-21 DIAGNOSIS — R600 Localized edema: Secondary | ICD-10-CM

## 2020-03-21 DIAGNOSIS — R262 Difficulty in walking, not elsewhere classified: Secondary | ICD-10-CM

## 2020-03-21 DIAGNOSIS — R293 Abnormal posture: Secondary | ICD-10-CM

## 2020-03-21 DIAGNOSIS — G8929 Other chronic pain: Secondary | ICD-10-CM

## 2020-03-21 NOTE — Therapy (Signed)
[place], [place], [address] Phone: [phone]   Fax:  [phone]  Physical Therapy Evaluation  Patient Details  Name: [name] [name] MRN: [id number] Date of Birth: 17-Jul-1935 Referring Provider (PT): Dr. Junius Roads   Encounter Date: 03/21/2020   PT End of Session - 03/21/20 0938    Visit Number 1    Number of Visits 12    Date for PT Re-Evaluation 05/16/20    Authorization Time Period 03/21/2020-05/16/2020 human[employer] request    Progress Note Due on Visit 10    PT Start Time 0939    PT Stop Time 1017    PT Time Calculation (min) 38 min    Activity Tolerance Patient tolerated treatment well    Behavior During Therapy [facility] for tasks assessed/performed           Past Medical History:  Diagnosis Date  . Anginal pain ([place])   . Arthritis   . Asthma   . Coronary artery disease   . Diabetes mellitus   . Dyspnea   . Early cataracts, bilateral   . GERD (gastroesophageal reflux disease)    occ  . Hypertension   . Lung cancer ([place])   . Mass of upper lobe of left lung    mediastinal adenopathy  . Palpitations   . Pneumonia   . Stroke [facility])    mini stroke  . Wears dentures   . Wears glasses     Past Surgical History:  Procedure Laterality Date  . ABDOMINAL HYSTERECTOMY    . ANKLE SURGERY Left    fusion  . COLONOSCOPY W/ BIOPSIES AND POLYPECTOMY    . IR ANGIO EXTRACRAN SEL COM CAROTID INNOMINATE UNI BILAT MOD SED  11/30/2018  . IR ANGIO VERTEBRAL SEL VERTEBRAL BILAT MOD SED  11/30/2018  . IR ANGIOGRAM SELECTIVE EACH ADDITIONAL VESSEL  11/30/2018  . MULTIPLE TOOTH EXTRACTIONS    . TOTAL KNEE ARTHROPLASTY Right   . VIDEO ASSISTED THORACOSCOPY (VATS)/ LOBECTOMY Left 04/21/2017   Procedure: VIDEO ASSISTED THORACOSCOPY (VATS)/LEFT UPPER LOBECTOMY;  Surgeon: Melrose Nakayama, MD;  Location: [place];  Service: Thoracic;  Laterality: Left;  Marland Kitchen VIDEO BRONCHOSCOPY WITH ENDOBRONCHIAL NAVIGATION N/A 01/21/2017   Procedure: VIDEO  BRONCHOSCOPY WITH ENDOBRONCHIAL NAVIGATION;  Surgeon: Melrose Nakayama, MD;  Location: [place] [facility];  Service: Thoracic;  Laterality: N/A;  . VIDEO BRONCHOSCOPY WITH ENDOBRONCHIAL ULTRASOUND N/A 01/21/2017   Procedure: VIDEO BRONCHOSCOPY WITH ENDOBRONCHIAL ULTRASOUND;  Surgeon: Melrose Nakayama, MD;  [facility];  Service: Thoracic;  Laterality: N/A;    There were no vitals filed for this visit.    Subjective Assessment - 03/21/20 0942    Subjective Pt. indicated having headaches on Rt side of head and into Rt neck and shoulder. Pt. stated more recent increase in last month or so.  Previous history of home health for legs that had some light treatment for neck that was helpful.  Pt. stated Lt ankle swells and pain occurs.  Pt. stated swelling has occured for about 2 months.  Pt. denied specific aggravating fx of activity.    Limitations Sitting;Walking;Standing    Patient Stated Goals Reduce pain    Currently in Pain? Yes    Pain Score 7    pain at worst 8/10   Pain Location Neck    Pain Orientation Right    Pain Descriptors / Indicators Aching    Pain Type Chronic pain    Pain Onset More than a month ago    Pain  Frequency Intermittent    Aggravating Factors  sleeping difficulty, sitting prolonged, watching tv prolonged    Pain Relieving Factors Light massage, some ice/heat at times.    Effect of Pain on Daily Activities Sitting, tv watching.    Multiple Pain Sites Yes    Pain Score 6   pain at worst 7/10   Pain Location Ankle    Pain Orientation Left    Pain Type Chronic pain    Pain Onset More than a month ago    Pain Frequency Intermittent    Aggravating Factors  uncertain    Effect of Pain on Daily Activities Standing/walking at times.              [facility] PT Assessment - 03/21/20 0001      Assessment   Medical Diagnosis Neck pain, Lt ankle pain    Referring Provider (PT) Dr. Junius Roads    Onset Date/Surgical Date 01/01/20      Precautions   Precautions None       Restrictions   Weight Bearing Restrictions No      Balance Screen   Has the patient fallen in the past 6 months No      [place] residence    Living Arrangements Children    Type of Gage to enter    Entrance Stairs-Number of Steps 1      Prior Function   Vocation Retired    Leisure Walk for exercise, household activity, self care      Cognition   Overall Cognitive Status Within Functional Limits for tasks assessed      Observation/Other Assessments-Edema    Edema Figure 8      Figure 8 Edema   Figure 8 - Right  21   inches   Figure 8 - Left  21.25      ROM / Strength   AROM / PROM / Strength Strength;PROM;AROM      AROM   Overall AROM Comments Rt cervical pain noted c Lt and Rt rotation cervical.      AROM Assessment Site Cervical;Ankle    Right/Left Ankle Left;Right    Left Ankle Dorsiflexion 0    Left Ankle Inversion 25    Left Ankle Eversion 14    Cervical Flexion 60    Cervical Extension 40    Cervical - Right Rotation 58    Cervical - Left Rotation 70      PROM   PROM Assessment Site Ankle    Right/Left Ankle Left;Right    Left Ankle Dorsiflexion 0    Left Ankle Inversion 25    Left Ankle Eversion 16      Strength   Overall Strength Comments Dermatomal check for UE WFL bilateral.  Ankle MMT perhpas limited due to comprehension of testing difficulty    Strength Assessment Site Ankle;Shoulder;Knee    Right/Left Shoulder Left;Right    Right/Left Knee Left;Right    Right/Left Ankle Left;Right    Right Ankle Dorsiflexion 5/5    Right Ankle Inversion 5/5    Right Ankle Eversion 5/5    Left Ankle Dorsiflexion 4+/5    Left Ankle Inversion 4/5    Left Ankle Eversion 4/5      Palpation   Palpation comment Strong concordant pain reproduction c Rt upper trap, levator scap for cervical pain.      Ambulation/Gait   Gait Comments SPC to Lt UE  Objective  measurements completed on examination: See above findings.       [place] Adult PT Treatment/Exercise - 03/21/20 0001      Self-Care   Self-Care Other Self-Care Comments    Other Self-Care Comments  Self care education on edema management c muscle pump and elevation attempts as well as HEP importance education      Exercises   Exercises Neck;Ankle;Other Exercises    Other Exercises  HEP instruction c performance, cues for techniques consisting of upper trap stretch, scapular retraction, ankle pumps, ankle abcs, cervical retraction per handout      Manual Therapy   Manual therapy comments compression to Rt upper trap                    PT Short Term Goals - 03/21/20 1025      PT SHORT TERM GOAL #1   Title Patient will demonstrate independent use of home exercise program to maintain progress from in clinic treatments.    Period Weeks    Status New    Target Date 04/04/20             PT Long Term Goals - 03/21/20 1024      PT LONG TERM GOAL #1   Title Patient will demonstrate/report pain at worst less than or equal to 2/10 to facilitate minimal limitation in daily activity secondary to pain symptoms.    Time 8    Period Weeks    Status New    Target Date 05/16/20      PT LONG TERM GOAL #2   Title Patient will demonstrate independent use of home exercise program to facilitate ability to maintain/progress functional gains from skilled physical therapy services.    Time 8    Period Weeks    Status New    Target Date 05/16/20      PT LONG TERM GOAL #3   Title Patient will demonstrate cervical AROM WFL s symptoms to facilitate daily activity including driving, self care at PLOF s limitation due to symptoms.    Time 8    Period Weeks    Status New    Target Date 05/16/20      PT LONG TERM GOAL #4   Title Pt. will demonstrate Lt ankle AROM WFL s symptoms to facilitate normal mobility in ambulation.    Time 8    Period Weeks    Status New    Target Date  05/16/20      PT LONG TERM GOAL #5   Title Pt. will demonstrate/report self corrected posture independent s cues for reduced strain on affected area to facilitate daily activity.    Time 8    Period Weeks    Status New    Target Date 05/16/20                  Plan - 03/21/20 1105    Clinical Impression Statement Patient is a 85 y.o. female who comes to clinic with complaints of cervical pain with mobility deficits, Lt ankle pain c mobility, strength, movement coordination deficits that impair her ability to perform usual daily and recreational functional activities without increase difficulty/symptoms at this time.  Patient to benefit from skilled PT services to address impairments and limitations to improve to previous level of function without restriction secondary to condition.    Personal Factors and Comorbidities Comorbidity 3+    Comorbidities HTN, DM, history of cancer (lung), CVA, GERD    Examination-Activity Limitations  Sit;Sleep;Stand;Locomotion Level;Lift    Examination-Participation Restrictions Community Activity;Other   home activity/self care   Stability/Clinical Decision Making Evolving/Moderate complexity    Clinical Decision Making Moderate    Rehab Potential --   fair to good   PT Frequency --   2x   PT Duration 8 weeks    PT Treatment/Interventions ADLs/Self Care Home Management;Cryotherapy;Therapeutic activities;Iontophoresis 4mg /ml Dexamethasone;Moist Heat;Traction;Therapeutic exercise;Balance training;Functional mobility training;Electrical Stimulation;Stair training;Gait training;Ultrasound;Neuromuscular re-education;Patient/family education;Manual techniques;Vasopneumatic Device;Taping;Dry needling;Joint Manipulations;Spinal Manipulations;Passive range of motion    PT Next Visit Plan Review HEP.  Possible DN, myofascial release for Rt cervical pain, balance/strength LE, postural strengthening    PT Home Exercise Plan [email or web address]    Consulted and Agree with Plan  of Care Patient           Patient will benefit from skilled therapeutic intervention in order to improve the following deficits and impairments:  Abnormal gait, Decreased endurance, Hypomobility, Increased edema, Decreased activity tolerance, Decreased strength, Pain, Increased fascial restricitons, Decreased balance, Decreased mobility, Difficulty walking, Increased muscle spasms, Postural dysfunction, Impaired flexibility, Decreased coordination, Decreased range of motion  Visit Diagnosis: Cervicalgia  Chronic pain of left ankle  Muscle weakness (generalized)  Abnormal posture  Difficulty in walking, not elsewhere classified  Localized edema     Problem List Patient Active Problem List   Diagnosis Date Noted  . Allergic rhinitis 12/07/2019  . 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 Hyperlipidemia 12/07/2019  . Hypertensive heart and renal disease 12/07/2019  . Overweight 12/07/2019  . Smoker 12/07/2019  . Vitamin D deficiency 12/07/2019  . Hypertensive emergency 12/02/2018  . Subarachnoid hemorrhage ([place]) 11/29/2018  . S/P lobectomy of lung 04/21/2017  . 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 ([place]) 02/16/2017  . Solitary pulmonary nodule 02/08/2017  . Shortness of breath 12/29/2016    Class: Acute  . Gastroesophageal reflux disease 03/04/2015  . Peripheral vascular disease, unspecified ([place]) 07/12/2013  . Atherosclerosis of native arteries of the extremities with intermittent claudication 07/12/2013  . Chest pain, cardiac 06/08/2011    Class: Acute  . Diabetes mellitus ([place]) 06/08/2011    Class: History of  . HTN (hypertension), benign 06/08/2011    Class: Chronic  . Obesity (BMI 30-39.9) 06/08/2011    Class: History of  . Coronary artery disease 06/08/2011    Class: History of    Scot Jun, PT, DPT, OCS, ATC 03/21/20  11:10 AM    [facility] [address] [place], [place], [address] Phone:  [phone]   Fax:  [phone]  Name: NEOMA UHRICH MRN: [id number] Date of Birth: [birth year]

## 2020-03-21 NOTE — Patient Instructions (Signed)
Access Code: [email or web address] URL: [email or web address] Date: 03/21/2020 Prepared by: Scot Jun  Exercises Seated Gentle Upper Trapezius Stretch - 2 x daily - 7 x weekly - 1 sets - 5 reps - 15 hold Seated Scapular Retraction - 2 x daily - 7 x weekly - 10 reps - 2 sets - 5 hold Seated Cervical Retraction - 2 x daily - 7 x weekly - 2 sets - 10 reps Ankle Pumps in Elevation - 2 x daily - 7 x weekly - 3 sets - 10 reps Ankle Alphabet in Elevation - 2 x daily - 7 x weekly - 1 sets - 3 reps

## 2020-04-02 ENCOUNTER — Other Ambulatory Visit: Payer: Self-pay

## 2020-04-02 ENCOUNTER — Ambulatory Visit: Payer: Medicare HMO | Admitting: Physical Therapy

## 2020-04-02 DIAGNOSIS — M25572 Pain in left ankle and joints of left foot: Secondary | ICD-10-CM | POA: Diagnosis not present

## 2020-04-02 DIAGNOSIS — R600 Localized edema: Secondary | ICD-10-CM

## 2020-04-02 DIAGNOSIS — M542 Cervicalgia: Secondary | ICD-10-CM

## 2020-04-02 DIAGNOSIS — M6281 Muscle weakness (generalized): Secondary | ICD-10-CM

## 2020-04-02 DIAGNOSIS — R293 Abnormal posture: Secondary | ICD-10-CM | POA: Diagnosis not present

## 2020-04-02 DIAGNOSIS — G8929 Other chronic pain: Secondary | ICD-10-CM

## 2020-04-02 DIAGNOSIS — R262 Difficulty in walking, not elsewhere classified: Secondary | ICD-10-CM

## 2020-04-02 NOTE — Therapy (Signed)
[place], [place], [address] Phone: [phone]   Fax:  [phone]  Physical Therapy Treatment  Patient Details  Name: [name] [name] MRN: [id number] Date of Birth: 1934-12-19 Referring Provider (PT): Dr. Junius Roads   Encounter Date: 04/02/2020   PT End of Session - 04/02/20 1008    Visit Number 2    Number of Visits 12    Date for PT Re-Evaluation 05/16/20    Authorization Time Period 03/21/2020-05/16/2020 humana request    Progress Note Due on Visit 10    PT Start Time 0932    PT Stop Time 1012    PT Time Calculation (min) 40 min    Activity Tolerance Patient tolerated treatment well    Behavior During Therapy [facility] for tasks assessed/performed           Past Medical History:  Diagnosis Date  . Anginal pain ([place])   . Arthritis   . Asthma   . Coronary artery disease   . Diabetes mellitus   . Dyspnea   . Early cataracts, bilateral   . GERD (gastroesophageal reflux disease)    occ  . Hypertension   . Lung cancer ([place])   . Mass of upper lobe of left lung    mediastinal adenopathy  . Palpitations   . Pneumonia   . Stroke [facility])    mini stroke  . Wears dentures   . Wears glasses     Past Surgical History:  Procedure Laterality Date  . ABDOMINAL HYSTERECTOMY    . ANKLE SURGERY Left    fusion  . COLONOSCOPY W/ BIOPSIES AND POLYPECTOMY    . IR ANGIO EXTRACRAN SEL COM CAROTID INNOMINATE UNI BILAT MOD SED  11/30/2018  . IR ANGIO VERTEBRAL SEL VERTEBRAL BILAT MOD SED  11/30/2018  . IR ANGIOGRAM SELECTIVE EACH ADDITIONAL VESSEL  11/30/2018  . MULTIPLE TOOTH EXTRACTIONS    . TOTAL KNEE ARTHROPLASTY Right   . VIDEO ASSISTED THORACOSCOPY (VATS)/ LOBECTOMY Left 04/21/2017   Procedure: VIDEO ASSISTED THORACOSCOPY (VATS)/LEFT UPPER LOBECTOMY;  Surgeon: Melrose Nakayama, MD;  Location: [facility];  Service: Thoracic;  Laterality: Left;  Marland Kitchen VIDEO BRONCHOSCOPY WITH ENDOBRONCHIAL NAVIGATION N/A 01/21/2017   Procedure: VIDEO  BRONCHOSCOPY WITH ENDOBRONCHIAL NAVIGATION;  Surgeon: Melrose Nakayama, MD;  Location: [place];  Service: Thoracic;  Laterality: N/A;  . VIDEO BRONCHOSCOPY WITH ENDOBRONCHIAL ULTRASOUND N/A 01/21/2017   Procedure: VIDEO BRONCHOSCOPY WITH ENDOBRONCHIAL ULTRASOUND;  Surgeon: Melrose Nakayama, MD;  [facility];  Service: Thoracic;  Laterality: N/A;    There were no vitals filed for this visit.   Subjective Assessment - 04/02/20 0942    Subjective She relays a little bit of headaches but overall her neck/shoulder/ankle are feeling pretty good    Limitations Sitting;Walking;Standing    Patient Stated Goals Reduce pain    Pain Onset More than a month ago    Pain Onset More than a month ago            [facility] Adult PT Treatment/Exercise - 04/02/20 0001      Exercises   Exercises Neck;Ankle      Neck Exercises: Machines for Strengthening   UBE (Upper Arm Bike) L2 2 min fwd, 2 min retro      Neck Exercises: Seated   Neck Retraction 20 reps    Other Seated Exercise scapular retraction X 20 reps    Other Seated Exercise cervical roation AAROM with self O.P. X 10 reps bilat  Manual Therapy   Manual therapy comments STM/IASTM, compression and T.P. release to Rt upper trap and bilat cervical P.S       Neck Exercises: Stretches   Upper Trapezius Stretch Right;Left;2 reps;30 seconds      Ankle Exercises: Stretches   Gastroc Stretch 3 reps;30 seconds    Gastroc Stretch Limitations seated with strap      Ankle Exercises: Aerobic   Nustep L4 X 5 min UE/LE      Ankle Exercises: Seated   Heel Raises Both;20 reps    Toe Raise 20 reps                    PT Short Term Goals - 03/21/20 1025      PT SHORT TERM GOAL #1   Title Patient will demonstrate independent use of home exercise program to maintain progress from in clinic treatments.    Period Weeks    Status New    Target Date 04/04/20             PT Long Term Goals - 03/21/20 1024      PT LONG TERM  GOAL #1   Title Patient will demonstrate/report pain at worst less than or equal to 2/10 to facilitate minimal limitation in daily activity secondary to pain symptoms.    Time 8    Period Weeks    Status New    Target Date 05/16/20      PT LONG TERM GOAL #2   Title Patient will demonstrate independent use of home exercise program to facilitate ability to maintain/progress functional gains from skilled physical therapy services.    Time 8    Period Weeks    Status New    Target Date 05/16/20      PT LONG TERM GOAL #3   Title Patient will demonstrate cervical AROM WFL s symptoms to facilitate daily activity including driving, self care at PLOF s limitation due to symptoms.    Time 8    Period Weeks    Status New    Target Date 05/16/20      PT LONG TERM GOAL #4   Title Pt. will demonstrate Lt ankle AROM WFL s symptoms to facilitate normal mobility in ambulation.    Time 8    Period Weeks    Status New    Target Date 05/16/20      PT LONG TERM GOAL #5   Title Pt. will demonstrate/report self corrected posture independent s cues for reduced strain on affected area to facilitate daily activity.    Time 8    Period Weeks    Status New    Target Date 05/16/20                 Plan - 04/02/20 1009    Clinical Impression Statement Session focused on gentle exercise and stretching for neck/shoulder/ankle today with fair to good overall activity tolerance. She was treated with manual therapy to decrease overall pain and tighntess in her neck and Rt shoulder. Continue POC    Personal Factors and Comorbidities Comorbidity 3+    Comorbidities HTN, DM, history of cancer (lung), CVA, GERD    Examination-Activity Limitations Sit;Sleep;Stand;Locomotion Level;Lift    Examination-Participation Restrictions Community Activity;Other   home activity/self care   Stability/Clinical Decision Making Evolving/Moderate complexity    Rehab Potential --   fair to good   PT Frequency --   2x   PT  Duration 8 weeks    PT  Treatment/Interventions ADLs/Self Care Home Management;Cryotherapy;Therapeutic activities;Iontophoresis 4mg /ml Dexamethasone;Moist Heat;Traction;Therapeutic exercise;Balance training;Functional mobility training;Electrical Stimulation;Stair training;Gait training;Ultrasound;Neuromuscular re-education;Patient/family education;Manual techniques;Vasopneumatic Device;Taping;Dry needling;Joint Manipulations;Spinal Manipulations;Passive range of motion    PT Next Visit Plan Review HEP.  Possible DN, myofascial release for Rt cervical pain, balance/strength LE, postural strengthening    PT Home Exercise Plan [id number]    Consulted and Agree with Plan of Care Patient           Patient will benefit from skilled therapeutic intervention in order to improve the following deficits and impairments:  Abnormal gait, Decreased endurance, Hypomobility, Increased edema, Decreased activity tolerance, Decreased strength, Pain, Increased fascial restricitons, Decreased balance, Decreased mobility, Difficulty walking, Increased muscle spasms, Postural dysfunction, Impaired flexibility, Decreased coordination, Decreased range of motion  Visit Diagnosis: Cervicalgia  Chronic pain of left ankle  Muscle weakness (generalized)  Abnormal posture  Difficulty in walking, not elsewhere classified  Localized edema     Problem List Patient Active Problem List   Diagnosis Date Noted  . Allergic rhinitis 12/07/2019  . 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 Hyperlipidemia 12/07/2019  . Hypertensive heart and renal disease 12/07/2019  . Overweight 12/07/2019  . Smoker 12/07/2019  . Vitamin D deficiency 12/07/2019  . Hypertensive emergency 12/02/2018  . Subarachnoid hemorrhage ([place]) 11/29/2018  . S/P lobectomy of lung 04/21/2017  . 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 ([place]) 02/16/2017  . Solitary pulmonary nodule 02/08/2017  . Shortness of breath 12/29/2016     Class: Acute  . Gastroesophageal reflux disease 03/04/2015  . Peripheral vascular disease, unspecified ([place]) 07/12/2013  . Atherosclerosis of native arteries of the extremities with intermittent claudication 07/12/2013  . Chest pain, cardiac 06/08/2011    Class: Acute  . Diabetes mellitus ([place]) 06/08/2011    Class: History of  . HTN (hypertension), benign 06/08/2011    Class: Chronic  . Obesity (BMI 30-39.9) 06/08/2011    Class: History of  . Coronary artery disease 06/08/2011    Class: History of    Silvestre Mesi 04/02/2020, 10:20 AM  [facility] Physical Therapy [address] [place], [place], [address] Phone: [phone]   Fax:  [phone]  Name: [name] [name] MRN: [id number] Date of Birth: 05-May-1935

## 2020-04-09 ENCOUNTER — Other Ambulatory Visit: Payer: Self-pay

## 2020-04-09 ENCOUNTER — Ambulatory Visit: Payer: Medicare HMO | Admitting: Physical Therapy

## 2020-04-09 DIAGNOSIS — R293 Abnormal posture: Secondary | ICD-10-CM

## 2020-04-09 DIAGNOSIS — M542 Cervicalgia: Secondary | ICD-10-CM

## 2020-04-09 DIAGNOSIS — M6281 Muscle weakness (generalized): Secondary | ICD-10-CM

## 2020-04-09 DIAGNOSIS — R600 Localized edema: Secondary | ICD-10-CM

## 2020-04-09 DIAGNOSIS — G8929 Other chronic pain: Secondary | ICD-10-CM

## 2020-04-09 DIAGNOSIS — M25572 Pain in left ankle and joints of left foot: Secondary | ICD-10-CM

## 2020-04-09 DIAGNOSIS — R262 Difficulty in walking, not elsewhere classified: Secondary | ICD-10-CM

## 2020-04-09 NOTE — Therapy (Signed)
[facility] Physical Therapy [address] [place], [place], [address] Phone: [phone]   Fax:  [phone]  Physical Therapy Treatment  Patient Details  Name: [name] [name] MRN: [id number] Date of Birth: August 13, 1934 Referring Provider (PT): Dr. Junius Roads   Encounter Date: 04/09/2020   PT End of Session - 04/09/20 1048    Visit Number 3    Number of Visits 12    Date for PT Re-Evaluation 05/16/20    Authorization Time Period 03/21/2020-05/16/2020 humana request    Progress Note Due on Visit 10    PT Start Time 1015    PT Stop Time 1057    PT Time Calculation (min) 42 min    Activity Tolerance Patient tolerated treatment well    Behavior During Therapy [facility] for tasks assessed/performed           Past Medical History:  Diagnosis Date  . Anginal pain ([place])   . Arthritis   . Asthma   . Coronary artery disease   . Diabetes mellitus   . Dyspnea   . Early cataracts, bilateral   . GERD (gastroesophageal reflux disease)    occ  . Hypertension   . Lung cancer ([place])   . Mass of upper lobe of left lung    mediastinal adenopathy  . Palpitations   . Pneumonia   . Stroke [facility])    mini stroke  . Wears dentures   . Wears glasses     Past Surgical History:  Procedure Laterality Date  . ABDOMINAL HYSTERECTOMY    . ANKLE SURGERY Left    fusion  . COLONOSCOPY W/ BIOPSIES AND POLYPECTOMY    . IR ANGIO EXTRACRAN SEL COM CAROTID INNOMINATE UNI BILAT MOD SED  11/30/2018  . IR ANGIO VERTEBRAL SEL VERTEBRAL BILAT MOD SED  11/30/2018  . IR ANGIOGRAM SELECTIVE EACH ADDITIONAL VESSEL  11/30/2018  . MULTIPLE TOOTH EXTRACTIONS    . TOTAL KNEE ARTHROPLASTY Right   . VIDEO ASSISTED THORACOSCOPY (VATS)/ LOBECTOMY Left 04/21/2017   Procedure: VIDEO ASSISTED THORACOSCOPY (VATS)/LEFT UPPER LOBECTOMY;  Surgeon: Melrose Nakayama, MD;  Location: [facility];  Service: Thoracic;  Laterality: Left;  Marland Kitchen VIDEO BRONCHOSCOPY WITH ENDOBRONCHIAL NAVIGATION N/A 01/21/2017   Procedure: VIDEO  BRONCHOSCOPY WITH ENDOBRONCHIAL NAVIGATION;  Surgeon: Melrose Nakayama, MD;  [facility];  Service: Thoracic;  Laterality: N/A;  . VIDEO BRONCHOSCOPY WITH ENDOBRONCHIAL ULTRASOUND N/A 01/21/2017   Procedure: VIDEO BRONCHOSCOPY WITH ENDOBRONCHIAL ULTRASOUND;  Surgeon: Melrose Nakayama, MD;  [facility];  Service: Thoracic;  Laterality: N/A;    There were no vitals filed for this visit.   Subjective Assessment - 04/09/20 1038    Subjective relays 8/10 pain in her neck and Rt shoulder.    Limitations Sitting;Walking;Standing    Patient Stated Goals Reduce pain    Pain Onset More than a month ago    Pain Onset More than a month ago             [facility] Adult PT Treatment/Exercise - 04/09/20 0001      Neck Exercises: Seated   Neck Retraction 20 reps    Cervical Rotation Both;10 reps    Other Seated Exercise scapular retraction X 20 reps, Cane shoulder flexion X 10 reps and  IR/ER X 10 reps    Other Seated Exercise cervical flexion/extension AROM X 10 reps      Modalities   Modalities Electrical Stimulation;Moist Heat      Moist Heat Therapy   Number Minutes Moist Heat 12  Minutes    Moist Heat Location Shoulder;Cervical      Electrical Stimulation   Electrical Stimulation Location neck and Rt shoulder    Electrical Stimulation Action IFC 12 min with heat in sitting    Electrical Stimulation Parameters to tolerance    Electrical Stimulation Goals Pain      Ankle Exercises: Aerobic   Nustep L3 X 6 min UE/LE      Ankle Exercises: Seated   Heel Raises Both;20 reps    Toe Raise 20 reps      Ankle Exercises: Stretches   Gastroc Stretch 3 reps;30 seconds    Gastroc Stretch Limitations slantboard                    PT Short Term Goals - 03/21/20 1025      PT SHORT TERM GOAL #1   Title Patient will demonstrate independent use of home exercise program to maintain progress from in clinic treatments.    Period Weeks    Status New    Target Date 04/04/20              PT Long Term Goals - 03/21/20 1024      PT LONG TERM GOAL #1   Title Patient will demonstrate/report pain at worst less than or equal to 2/10 to facilitate minimal limitation in daily activity secondary to pain symptoms.    Time 8    Period Weeks    Status New    Target Date 05/16/20      PT LONG TERM GOAL #2   Title Patient will demonstrate independent use of home exercise program to facilitate ability to maintain/progress functional gains from skilled physical therapy services.    Time 8    Period Weeks    Status New    Target Date 05/16/20      PT LONG TERM GOAL #3   Title Patient will demonstrate cervical AROM WFL s symptoms to facilitate daily activity including driving, self care at PLOF s limitation due to symptoms.    Time 8    Period Weeks    Status New    Target Date 05/16/20      PT LONG TERM GOAL #4   Title Pt. will demonstrate Lt ankle AROM WFL s symptoms to facilitate normal mobility in ambulation.    Time 8    Period Weeks    Status New    Target Date 05/16/20      PT LONG TERM GOAL #5   Title Pt. will demonstrate/report self corrected posture independent s cues for reduced strain on affected area to facilitate daily activity.    Time 8    Period Weeks    Status New    Target Date 05/16/20                 Plan - 04/09/20 1049    Clinical Impression Statement She was in a lot more pain today so trialed heat and IFC TENS in efforts to reduce overall pain. She needs cues for posture and was encouraged to continue to perform gentle exercise and stretches into pain free ROM and to not go into pain.    Personal Factors and Comorbidities Comorbidity 3+    Comorbidities HTN, DM, history of cancer (lung), CVA, GERD    Examination-Activity Limitations Sit;Sleep;Stand;Locomotion Level;Lift    Examination-Participation Restrictions Community Activity;Other   home activity/self care   Stability/Clinical Decision Making Evolving/Moderate  complexity    Rehab Potential --  fair to good   PT Frequency --   2x   PT Duration 8 weeks    PT Treatment/Interventions ADLs/Self Care Home Management;Cryotherapy;Therapeutic activities;Iontophoresis 4mg /ml Dexamethasone;Moist Heat;Traction;Therapeutic exercise;Balance training;Functional mobility training;Electrical Stimulation;Stair training;Gait training;Ultrasound;Neuromuscular re-education;Patient/family education;Manual techniques;Vasopneumatic Device;Taping;Dry needling;Joint Manipulations;Spinal Manipulations;Passive range of motion    PT Next Visit Plan how was TENS from last time?  Possible DN, myofascial release for Rt cervical pain, balance/strength LE, postural strengthening    PT Home Exercise Plan [id number]    Consulted and Agree with Plan of Care Patient           Patient will benefit from skilled therapeutic intervention in order to improve the following deficits and impairments:  Abnormal gait, Decreased endurance, Hypomobility, Increased edema, Decreased activity tolerance, Decreased strength, Pain, Increased fascial restricitons, Decreased balance, Decreased mobility, Difficulty walking, Increased muscle spasms, Postural dysfunction, Impaired flexibility, Decreased coordination, Decreased range of motion  Visit Diagnosis: Cervicalgia  Chronic pain of left ankle  Muscle weakness (generalized)  Abnormal posture  Difficulty in walking, not elsewhere classified  Localized edema     Problem List Patient Active Problem List   Diagnosis Date Noted  . Allergic rhinitis 12/07/2019  . 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 Hyperlipidemia 12/07/2019  . Hypertensive heart and renal disease 12/07/2019  . Overweight 12/07/2019  . Smoker 12/07/2019  . Vitamin D deficiency 12/07/2019  . Hypertensive emergency 12/02/2018  . Subarachnoid hemorrhage ([place]) 11/29/2018  . S/P lobectomy of lung 04/21/2017  . 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  ([place]) 02/16/2017  . Solitary pulmonary nodule 02/08/2017  . Shortness of breath 12/29/2016    Class: Acute  . Gastroesophageal reflux disease 03/04/2015  . Peripheral vascular disease, unspecified ([place]) 07/12/2013  . Atherosclerosis of native arteries of the extremities with intermittent claudication 07/12/2013  . Chest pain, cardiac 06/08/2011    Class: Acute  . Diabetes mellitus ([place]) 06/08/2011    Class: History of  . HTN (hypertension), benign 06/08/2011    Class: Chronic  . Obesity (BMI 30-39.9) 06/08/2011    Class: History of  . Coronary artery disease 06/08/2011    Class: History of    Silvestre Mesi 04/09/2020, 10:51 AM  [facility] Physical Therapy [address] [place], [place], [address] Phone: [phone]   Fax:  [phone]  Name: [name] [name] MRN: [id number] Date of Birth: 1934/11/19

## 2020-04-23 ENCOUNTER — Encounter: Payer: Self-pay | Admitting: Rehabilitative and Restorative Service Providers"

## 2020-04-23 ENCOUNTER — Other Ambulatory Visit: Payer: Self-pay

## 2020-04-23 ENCOUNTER — Ambulatory Visit: Payer: Medicare HMO | Admitting: Rehabilitative and Restorative Service Providers"

## 2020-04-23 DIAGNOSIS — R293 Abnormal posture: Secondary | ICD-10-CM

## 2020-04-23 DIAGNOSIS — M542 Cervicalgia: Secondary | ICD-10-CM | POA: Diagnosis not present

## 2020-04-23 DIAGNOSIS — R600 Localized edema: Secondary | ICD-10-CM

## 2020-04-23 DIAGNOSIS — M25572 Pain in left ankle and joints of left foot: Secondary | ICD-10-CM | POA: Diagnosis not present

## 2020-04-23 DIAGNOSIS — M6281 Muscle weakness (generalized): Secondary | ICD-10-CM

## 2020-04-23 DIAGNOSIS — R262 Difficulty in walking, not elsewhere classified: Secondary | ICD-10-CM

## 2020-04-23 DIAGNOSIS — G8929 Other chronic pain: Secondary | ICD-10-CM

## 2020-04-23 NOTE — Therapy (Signed)
[facility] [address] [place], [place], [address] Phone: [phone]   Fax:  [phone]  Physical Therapy Treatment  Patient Details  Name: [name] [name] MRN: [id number] Date of Birth: 30-Dec-1934 Referring Provider (PT): Dr. Junius Roads   Encounter Date: 04/23/2020   PT End of Session - 04/23/20 1030    Visit Number 4    Number of Visits 12    Date for PT Re-Evaluation 05/16/20    Authorization Time Period 03/21/2020-05/16/2020 humana request    Progress Note Due on Visit 10    PT Start Time 1023    PT Stop Time 1100    PT Time Calculation (min) 37 min    Activity Tolerance No increased pain    Behavior During Therapy [facility] for tasks assessed/performed           Past Medical History:  Diagnosis Date  . Anginal pain ([place])   . Arthritis   . Asthma   . Coronary artery disease   . Diabetes mellitus   . Dyspnea   . Early cataracts, bilateral   . GERD (gastroesophageal reflux disease)    occ  . Hypertension   . Lung cancer ([place])   . Mass of upper lobe of left lung    mediastinal adenopathy  . Palpitations   . Pneumonia   . Stroke [facility])    mini stroke  . Wears dentures   . Wears glasses     Past Surgical History:  Procedure Laterality Date  . ABDOMINAL HYSTERECTOMY    . ANKLE SURGERY Left    fusion  . COLONOSCOPY W/ BIOPSIES AND POLYPECTOMY    . IR ANGIO EXTRACRAN SEL COM CAROTID INNOMINATE UNI BILAT MOD SED  11/30/2018  . IR ANGIO VERTEBRAL SEL VERTEBRAL BILAT MOD SED  11/30/2018  . IR ANGIOGRAM SELECTIVE EACH ADDITIONAL VESSEL  11/30/2018  . MULTIPLE TOOTH EXTRACTIONS    . TOTAL KNEE ARTHROPLASTY Right   . VIDEO ASSISTED THORACOSCOPY (VATS)/ LOBECTOMY Left 04/21/2017   Procedure: VIDEO ASSISTED THORACOSCOPY (VATS)/LEFT UPPER LOBECTOMY;  Surgeon: Melrose Nakayama, MD;  Location: [place];  Service: Thoracic;  Laterality: Left;  Marland Kitchen VIDEO BRONCHOSCOPY WITH ENDOBRONCHIAL NAVIGATION N/A 01/21/2017   Procedure: VIDEO BRONCHOSCOPY  WITH ENDOBRONCHIAL NAVIGATION;  Surgeon: Melrose Nakayama, MD;  Location: [place];  Service: Thoracic;  Laterality: N/A;  . VIDEO BRONCHOSCOPY WITH ENDOBRONCHIAL ULTRASOUND N/A 01/21/2017   Procedure: VIDEO BRONCHOSCOPY WITH ENDOBRONCHIAL ULTRASOUND;  Surgeon: Melrose Nakayama, MD;  [facility];  Service: Thoracic;  Laterality: N/A;    There were no vitals filed for this visit.   Subjective Assessment - 04/23/20 1027    Subjective Pt. stated feeling like the machine last time was helpful.  Pt. stated feeling some improvement in symptoms.  Rated symptoms today at 6-7/10.    Limitations Sitting;Walking;Standing    Patient Stated Goals Reduce pain    Currently in Pain? Yes    Pain Score 6     Pain Location Neck    Pain Orientation Right    Pain Descriptors / Indicators Aching    Pain Type Chronic pain    Pain Onset More than a month ago    Pain Frequency Constant    Aggravating Factors  reported insidious constant    Pain Relieving Factors estim, heat    Pain Onset More than a month ago  [place] Adult PT Treatment/Exercise - 04/23/20 0001      Neck Exercises: Machines for Strengthening   UBE (Upper Arm Bike) Lvl 2 3.5 mins fwd/back each way      Neck Exercises: Standing   Other Standing Exercises tband rows, gh ext 20x green band      Neck Exercises: Seated   Other Seated Exercise scapular retraction hold 5 sec x 10      Moist Heat Therapy   Number Minutes Moist Heat --    Moist Heat Location --      Electrical Stimulation   Electrical Stimulation Location neck and Rt shoulder    Electrical Stimulation Action IFC 10 mins c heat    Electrical Stimulation Parameters to tolerance    Electrical Stimulation Goals Pain      Neck Exercises: Stretches   Upper Trapezius Stretch Right;3 reps   15 seconds   Levator Stretch Right;3 reps   15 seconds     Ankle Exercises: Seated   Heel Raises Both;20 reps    Toe Raise 20 reps                      PT Short Term Goals - 04/23/20 1030      PT SHORT TERM GOAL #1   Title Patient will demonstrate independent use of home exercise program to maintain progress from in clinic treatments.    Baseline 04/23/2020: cues still required for techniques    Period Weeks    Status On-going    Target Date 05/07/20             PT Long Term Goals - 03/21/20 1024      PT LONG TERM GOAL #1   Title Patient will demonstrate/report pain at worst less than or equal to 2/10 to facilitate minimal limitation in daily activity secondary to pain symptoms.    Time 8    Period Weeks    Status New    Target Date 05/16/20      PT LONG TERM GOAL #2   Title Patient will demonstrate independent use of home exercise program to facilitate ability to maintain/progress functional gains from skilled physical therapy services.    Time 8    Period Weeks    Status New    Target Date 05/16/20      PT LONG TERM GOAL #3   Title Patient will demonstrate cervical AROM WFL s symptoms to facilitate daily activity including driving, self care at PLOF s limitation due to symptoms.    Time 8    Period Weeks    Status New    Target Date 05/16/20      PT LONG TERM GOAL #4   Title Pt. will demonstrate Lt ankle AROM WFL s symptoms to facilitate normal mobility in ambulation.    Time 8    Period Weeks    Status New    Target Date 05/16/20      PT LONG TERM GOAL #5   Title Pt. will demonstrate/report self corrected posture independent s cues for reduced strain on affected area to facilitate daily activity.    Time 8    Period Weeks    Status New    Target Date 05/16/20                 Plan - 04/23/20 1043    Clinical Impression Statement Consistent cuing required reviewof HEP and ther ex intervention to improve techniques.  Poor recall most likely negatively  impacting ability to perform successfully and routinely at home at this time.  Difficulty noted in activation and adjustment  of increased thoracic kyphosis, bilateral scap protraction and FHP (veyr prominent).    Personal Factors and Comorbidities Comorbidity 3+    Comorbidities HTN, DM, history of cancer (lung), CVA, GERD    Examination-Activity Limitations Sit;Sleep;Stand;Locomotion Level;Lift    Examination-Participation Restrictions Community Activity;Other   home activity/self care   Stability/Clinical Decision Making Evolving/Moderate complexity    Rehab Potential --   fair to good   PT Frequency --   2x   PT Duration 8 weeks    PT Treatment/Interventions ADLs/Self Care Home Management;Cryotherapy;Therapeutic activities;Iontophoresis 4mg /ml Dexamethasone;Moist Heat;Traction;Therapeutic exercise;Balance training;Functional mobility training;Electrical Stimulation;Stair training;Gait training;Ultrasound;Neuromuscular re-education;Patient/family education;Manual techniques;Vasopneumatic Device;Taping;Dry needling;Joint Manipulations;Spinal Manipulations;Passive range of motion    PT Next Visit Plan ESTIM/TENS possible, continued postural strengthening, improved cervicothoracic mobility.    PT Home Exercise Plan [id number]    Consulted and Agree with Plan of Care Patient           Patient will benefit from skilled therapeutic intervention in order to improve the following deficits and impairments:  Abnormal gait, Decreased endurance, Hypomobility, Increased edema, Decreased activity tolerance, Decreased strength, Pain, Increased fascial restricitons, Decreased balance, Decreased mobility, Difficulty walking, Increased muscle spasms, Postural dysfunction, Impaired flexibility, Decreased coordination, Decreased range of motion  Visit Diagnosis: Cervicalgia  Chronic pain of left ankle  Muscle weakness (generalized)  Abnormal posture  Difficulty in walking, not elsewhere classified  Localized edema     Problem List Patient Active Problem List   Diagnosis Date Noted  . Allergic rhinitis 12/07/2019  .  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 Hyperlipidemia 12/07/2019  . Hypertensive heart and renal disease 12/07/2019  . Overweight 12/07/2019  . Smoker 12/07/2019  . Vitamin D deficiency 12/07/2019  . Hypertensive emergency 12/02/2018  . Subarachnoid hemorrhage ([place]) 11/29/2018  . S/P lobectomy of lung 04/21/2017  . 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 ([place]) 02/16/2017  . Solitary pulmonary nodule 02/08/2017  . Shortness of breath 12/29/2016    Class: Acute  . Gastroesophageal reflux disease 03/04/2015  . Peripheral vascular disease, unspecified ([place]) 07/12/2013  . Atherosclerosis of native arteries of the extremities with intermittent claudication 07/12/2013  . Chest pain, cardiac 06/08/2011    Class: Acute  . Diabetes mellitus ([place]) 06/08/2011    Class: History of  . HTN (hypertension), benign 06/08/2011    Class: Chronic  . Obesity (BMI 30-39.9) 06/08/2011    Class: History of  . Coronary artery disease 06/08/2011    Class: History of    Scot Jun, PT, DPT, OCS, ATC 04/23/20  10:54 AM    [facility] [address] [place], [place], [address] Phone: [phone]   Fax:  [phone]  Name: ROSALITA CAREY MRN: [id number] Date of Birth: October 20, 1934

## 2020-04-28 ENCOUNTER — Other Ambulatory Visit: Payer: Self-pay

## 2020-04-28 ENCOUNTER — Ambulatory Visit: Payer: Medicare HMO | Admitting: Rehabilitative and Restorative Service Providers"

## 2020-04-28 DIAGNOSIS — R600 Localized edema: Secondary | ICD-10-CM

## 2020-04-28 DIAGNOSIS — R293 Abnormal posture: Secondary | ICD-10-CM | POA: Diagnosis not present

## 2020-04-28 DIAGNOSIS — M25572 Pain in left ankle and joints of left foot: Secondary | ICD-10-CM

## 2020-04-28 DIAGNOSIS — M6281 Muscle weakness (generalized): Secondary | ICD-10-CM | POA: Diagnosis not present

## 2020-04-28 DIAGNOSIS — M542 Cervicalgia: Secondary | ICD-10-CM

## 2020-04-28 DIAGNOSIS — R262 Difficulty in walking, not elsewhere classified: Secondary | ICD-10-CM

## 2020-04-28 DIAGNOSIS — G8929 Other chronic pain: Secondary | ICD-10-CM

## 2020-04-28 NOTE — Therapy (Signed)
[facility], [place], [address] Phone: [phone]   Fax:  [phone]  Physical Therapy Treatment  Patient Details  Name: [name] [name] MRN: [id number] Date of Birth: 05-Jun-1935 Referring Provider (PT): Dr. Junius Roads   Encounter Date: 04/28/2020   PT End of Session - 04/28/20 1018    Visit Number 5    Number of Visits 12    Date for PT Re-Evaluation 05/16/20    Authorization Time Period 03/21/2020-05/16/2020 humana request    Progress Note Due on Visit 10    PT Start Time 1017    PT Stop Time 1056    PT Time Calculation (min) 39 min    Activity Tolerance No increased pain    Behavior During Therapy [facility] for tasks assessed/performed           Past Medical History:  Diagnosis Date  . Anginal pain ([place])   . Arthritis   . Asthma   . Coronary artery disease   . Diabetes mellitus   . Dyspnea   . Early cataracts, bilateral   . GERD (gastroesophageal reflux disease)    occ  . Hypertension   . Lung cancer ([place])   . Mass of upper lobe of left lung    mediastinal adenopathy  . Palpitations   . Pneumonia   . Stroke [facility])    mini stroke  . Wears dentures   . Wears glasses     Past Surgical History:  Procedure Laterality Date  . ABDOMINAL HYSTERECTOMY    . ANKLE SURGERY Left    fusion  . COLONOSCOPY W/ BIOPSIES AND POLYPECTOMY    . IR ANGIO EXTRACRAN SEL COM CAROTID INNOMINATE UNI BILAT MOD SED  11/30/2018  . IR ANGIO VERTEBRAL SEL VERTEBRAL BILAT MOD SED  11/30/2018  . IR ANGIOGRAM SELECTIVE EACH ADDITIONAL VESSEL  11/30/2018  . MULTIPLE TOOTH EXTRACTIONS    . TOTAL KNEE ARTHROPLASTY Right   . VIDEO ASSISTED THORACOSCOPY (VATS)/ LOBECTOMY Left 04/21/2017   Procedure: VIDEO ASSISTED THORACOSCOPY (VATS)/LEFT UPPER LOBECTOMY;  Surgeon: Melrose Nakayama, MD;  Location: [place];  Service: Thoracic;  Laterality: Left;  Marland Kitchen VIDEO BRONCHOSCOPY WITH ENDOBRONCHIAL NAVIGATION N/A 01/21/2017   Procedure: VIDEO BRONCHOSCOPY  WITH ENDOBRONCHIAL NAVIGATION;  Surgeon: Melrose Nakayama, MD;  Location: [facility];  Service: Thoracic;  Laterality: N/A;  . VIDEO BRONCHOSCOPY WITH ENDOBRONCHIAL ULTRASOUND N/A 01/21/2017   Procedure: VIDEO BRONCHOSCOPY WITH ENDOBRONCHIAL ULTRASOUND;  Surgeon: Melrose Nakayama, MD;  [facility];  Service: Thoracic;  Laterality: N/A;    There were no vitals filed for this visit.   Subjective Assessment - 04/28/20 1030    Subjective Pt. did not report specific pain level today.  Pt. voiced that she was told about an at home massager that she might want to try.    Limitations Sitting;Walking;Standing    Patient Stated Goals Reduce pain    Pain Onset More than a month ago    Pain Onset More than a month ago                             [facility] Adult PT Treatment/Exercise - 04/28/20 0001      Neck Exercises: Machines for Strengthening   Nustep lvl 5 10 mins      Neck Exercises: Standing   Other Standing Exercises tband rows, gh ext 3 x 10 green band      Neck Exercises: Seated   Other Seated  Exercise seated ER red band in neutral 20x    Other Seated Exercise seated thoracic extension over chair x 15      Manual Therapy   Manual therapy comments percussive device soft tissue mobilizations bilateral upper trap, infraspinatus      Ankle Exercises: Stretches   Gastroc Stretch 3 reps;30 seconds    Gastroc Stretch Limitations slantboard                  PT Education - 04/28/20 1034    Education Details Continued HEP and exercise techniques cues.  TENS unit for home, massager general information.    Person(s) Educated Patient    Methods Explanation;Demonstration;Verbal cues    Comprehension Verbalized understanding;Returned demonstration            PT Short Term Goals - 04/23/20 1030      PT SHORT TERM GOAL #1   Title Patient will demonstrate independent use of home exercise program to maintain progress from in clinic treatments.    Baseline  04/23/2020: cues still required for techniques    Period Weeks    Status On-going    Target Date 05/07/20             PT Long Term Goals - 03/21/20 1024      PT LONG TERM GOAL #1   Title Patient will demonstrate/report pain at worst less than or equal to 2/10 to facilitate minimal limitation in daily activity secondary to pain symptoms.    Time 8    Period Weeks    Status New    Target Date 05/16/20      PT LONG TERM GOAL #2   Title Patient will demonstrate independent use of home exercise program to facilitate ability to maintain/progress functional gains from skilled physical therapy services.    Time 8    Period Weeks    Status New    Target Date 05/16/20      PT LONG TERM GOAL #3   Title Patient will demonstrate cervical AROM WFL s symptoms to facilitate daily activity including driving, self care at PLOF s limitation due to symptoms.    Time 8    Period Weeks    Status New    Target Date 05/16/20      PT LONG TERM GOAL #4   Title Pt. will demonstrate Lt ankle AROM WFL s symptoms to facilitate normal mobility in ambulation.    Time 8    Period Weeks    Status New    Target Date 05/16/20      PT LONG TERM GOAL #5   Title Pt. will demonstrate/report self corrected posture independent s cues for reduced strain on affected area to facilitate daily activity.    Time 8    Period Weeks    Status New    Target Date 05/16/20                 Plan - 04/28/20 1049    Clinical Impression Statement Continued cues required.  Education and additional time spent on review for at home devices (TENS, massager) as well as importance of use of HEP for postural impairments that are evident    Personal Factors and Comorbidities Comorbidity 3+    Comorbidities HTN, DM, history of cancer (lung), CVA, GERD    Examination-Activity Limitations Sit;Sleep;Stand;Locomotion Level;Lift    Examination-Participation Restrictions Community Activity;Other   home activity/self care    Stability/Clinical Decision Making Evolving/Moderate complexity    Rehab Potential --  fair to good   PT Frequency --   2x   PT Duration 8 weeks    PT Treatment/Interventions ADLs/Self Care Home Management;Cryotherapy;Therapeutic activities;Iontophoresis 4mg /ml Dexamethasone;Moist Heat;Traction;Therapeutic exercise;Balance training;Functional mobility training;Electrical Stimulation;Stair training;Gait training;Ultrasound;Neuromuscular re-education;Patient/family education;Manual techniques;Vasopneumatic Device;Taping;Dry needling;Joint Manipulations;Spinal Manipulations;Passive range of motion    PT Next Visit Plan Review any at home device she purchases, continued postural strengthening, improved cervicothoracic mobility.    PT Home Exercise Plan [id number]    Consulted and Agree with Plan of Care Patient           Patient will benefit from skilled therapeutic intervention in order to improve the following deficits and impairments:  Abnormal gait, Decreased endurance, Hypomobility, Increased edema, Decreased activity tolerance, Decreased strength, Pain, Increased fascial restricitons, Decreased balance, Decreased mobility, Difficulty walking, Increased muscle spasms, Postural dysfunction, Impaired flexibility, Decreased coordination, Decreased range of motion  Visit Diagnosis: Cervicalgia  Chronic pain of left ankle  Muscle weakness (generalized)  Abnormal posture  Difficulty in walking, not elsewhere classified  Localized edema     Problem List Patient Active Problem List   Diagnosis Date Noted  . Allergic rhinitis 12/07/2019  . 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 Hyperlipidemia 12/07/2019  . Hypertensive heart and renal disease 12/07/2019  . Overweight 12/07/2019  . Smoker 12/07/2019  . Vitamin D deficiency 12/07/2019  . Hypertensive emergency 12/02/2018  . Subarachnoid hemorrhage ([place]) 11/29/2018  . S/P lobectomy of lung 04/21/2017  . Primary malignant neoplasm  of bronchus of left upper lobe ([place]) 02/16/2017  . Solitary pulmonary nodule 02/08/2017  . Shortness of breath 12/29/2016    Class: Acute  . Gastroesophageal reflux disease 03/04/2015  . Peripheral vascular disease, unspecified ([place]) 07/12/2013  . Atherosclerosis of native arteries of the extremities with intermittent claudication 07/12/2013  . Chest pain, cardiac 06/08/2011    Class: Acute  . Diabetes mellitus ([place]) 06/08/2011    Class: History of  . HTN (hypertension), benign 06/08/2011    Class: Chronic  . Obesity (BMI 30-39.9) 06/08/2011    Class: History of  . Coronary artery disease 06/08/2011    Class: History of    Scot Jun, PT, DPT, OCS, ATC 04/28/20  10:53 AM    [facility] [address] [place], [place], [address] Phone: [phone]   Fax:  [phone]  Name: [name] [name] MRN: [id number] Date of Birth: 03-09-1935

## 2020-05-05 ENCOUNTER — Encounter: Payer: Self-pay | Admitting: Rehabilitative and Restorative Service Providers"

## 2020-05-05 ENCOUNTER — Other Ambulatory Visit: Payer: Self-pay

## 2020-05-05 ENCOUNTER — Ambulatory Visit (INDEPENDENT_AMBULATORY_CARE_PROVIDER_SITE_OTHER): Payer: Medicare HMO | Admitting: Rehabilitative and Restorative Service Providers"

## 2020-05-05 DIAGNOSIS — R600 Localized edema: Secondary | ICD-10-CM

## 2020-05-05 DIAGNOSIS — M6281 Muscle weakness (generalized): Secondary | ICD-10-CM | POA: Diagnosis not present

## 2020-05-05 DIAGNOSIS — R293 Abnormal posture: Secondary | ICD-10-CM

## 2020-05-05 DIAGNOSIS — R262 Difficulty in walking, not elsewhere classified: Secondary | ICD-10-CM

## 2020-05-05 DIAGNOSIS — M542 Cervicalgia: Secondary | ICD-10-CM | POA: Diagnosis not present

## 2020-05-05 DIAGNOSIS — M25572 Pain in left ankle and joints of left foot: Secondary | ICD-10-CM | POA: Diagnosis not present

## 2020-05-05 DIAGNOSIS — G8929 Other chronic pain: Secondary | ICD-10-CM

## 2020-05-05 NOTE — Therapy (Signed)
[facility] Physical Therapy [address] [place], [place], [address] Phone: [phone]   Fax:  [phone]  Physical Therapy Treatment  Patient Details  Name: [name] [name] MRN: [id number] Date of Birth: 1934/08/21 Referring Provider (PT): Dr. Junius Roads   Encounter Date: 05/05/2020   PT End of Session - 05/05/20 1037    Visit Number 6    Number of Visits 12    Date for PT Re-Evaluation 05/16/20    Authorization Time Period 03/21/2020-05/16/2020 humana request    Progress Note Due on Visit 10    PT Start Time 1014    PT Stop Time 1054    PT Time Calculation (min) 40 min    Activity Tolerance No increased pain;Patient tolerated treatment well    Behavior During Therapy [facility] for tasks assessed/performed           Past Medical History:  Diagnosis Date  . Anginal pain ([place])   . Arthritis   . Asthma   . Coronary artery disease   . Diabetes mellitus   . Dyspnea   . Early cataracts, bilateral   . GERD (gastroesophageal reflux disease)    occ  . Hypertension   . Lung cancer ([place])   . Mass of upper lobe of left lung    mediastinal adenopathy  . Palpitations   . Pneumonia   . Stroke [facility])    mini stroke  . Wears dentures   . Wears glasses     Past Surgical History:  Procedure Laterality Date  . ABDOMINAL HYSTERECTOMY    . ANKLE SURGERY Left    fusion  . COLONOSCOPY W/ BIOPSIES AND POLYPECTOMY    . IR ANGIO EXTRACRAN SEL COM CAROTID INNOMINATE UNI BILAT MOD SED  11/30/2018  . IR ANGIO VERTEBRAL SEL VERTEBRAL BILAT MOD SED  11/30/2018  . IR ANGIOGRAM SELECTIVE EACH ADDITIONAL VESSEL  11/30/2018  . MULTIPLE TOOTH EXTRACTIONS    . TOTAL KNEE ARTHROPLASTY Right   . VIDEO ASSISTED THORACOSCOPY (VATS)/ LOBECTOMY Left 04/21/2017   Procedure: VIDEO ASSISTED THORACOSCOPY (VATS)/LEFT UPPER LOBECTOMY;  Surgeon: Melrose Nakayama, MD;  Location: [place];  Service: Thoracic;  Laterality: Left;  Marland Kitchen VIDEO BRONCHOSCOPY WITH ENDOBRONCHIAL NAVIGATION N/A 01/21/2017    Procedure: VIDEO BRONCHOSCOPY WITH ENDOBRONCHIAL NAVIGATION;  Surgeon: Melrose Nakayama, MD;  Location: [place];  Service: Thoracic;  Laterality: N/A;  . VIDEO BRONCHOSCOPY WITH ENDOBRONCHIAL ULTRASOUND N/A 01/21/2017   Procedure: VIDEO BRONCHOSCOPY WITH ENDOBRONCHIAL ULTRASOUND;  Surgeon: Melrose Nakayama, MD;  [facility];  Service: Thoracic;  Laterality: N/A;    There were no vitals filed for this visit.   Subjective Assessment - 05/05/20 1036    Subjective Pt. indicated feeling alright today, no specific pain reported.  Brought in at home massager today for educational use instruction.    Limitations Sitting;Walking;Standing    Patient Stated Goals Reduce pain    Currently in Pain? No/denies    Pain Onset More than a month ago    Pain Score 0    Pain Onset More than a month ago                             [facility] Adult PT Treatment/Exercise - 05/05/20 0001      Self-Care   Other Self-Care Comments  Education regarding use of new massager with cues and demonstrate for application, operation, etc      Neck Exercises: Theraband   Shoulder Extension 20 reps;Green  Rows 20 reps;Green    Other Theraband Exercises supine horizontal abd red x 20 c consistent cues      Neck Exercises: Supine   Other Supine Exercise UC flexion hold 5 sec x 10                  PT Education - 05/05/20 1044    Education Details Continued HEP cues and postural awareness.  self care for at home massager.    Person(s) Educated Patient    Methods Explanation;Demonstration;Tactile cues;Verbal cues    Comprehension Verbalized understanding;Returned demonstration;Verbal cues required;Tactile cues required;Need further instruction            PT Short Term Goals - 04/23/20 1030      PT SHORT TERM GOAL #1   Title Patient will demonstrate independent use of home exercise program to maintain progress from in clinic treatments.    Baseline 04/23/2020: cues still required  for techniques    Period Weeks    Status On-going    Target Date 05/07/20             PT Long Term Goals - 03/21/20 1024      PT LONG TERM GOAL #1   Title Patient will demonstrate/report pain at worst less than or equal to 2/10 to facilitate minimal limitation in daily activity secondary to pain symptoms.    Time 8    Period Weeks    Status New    Target Date 05/16/20      PT LONG TERM GOAL #2   Title Patient will demonstrate independent use of home exercise program to facilitate ability to maintain/progress functional gains from skilled physical therapy services.    Time 8    Period Weeks    Status New    Target Date 05/16/20      PT LONG TERM GOAL #3   Title Patient will demonstrate cervical AROM WFL s symptoms to facilitate daily activity including driving, self care at PLOF s limitation due to symptoms.    Time 8    Period Weeks    Status New    Target Date 05/16/20      PT LONG TERM GOAL #4   Title Pt. will demonstrate Lt ankle AROM WFL s symptoms to facilitate normal mobility in ambulation.    Time 8    Period Weeks    Status New    Target Date 05/16/20      PT LONG TERM GOAL #5   Title Pt. will demonstrate/report self corrected posture independent s cues for reduced strain on affected area to facilitate daily activity.    Time 8    Period Weeks    Status New    Target Date 05/16/20                 Plan - 05/05/20 1038    Clinical Impression Statement Pt. purchased massager doesn't appear to be of good application use for her due to size and inability to place on affected area of symptoms.  Pt. required persistent cues throughout intervention to improve postural awareness and adherence to techniques of intervention.  Poor control overall noted in UE movements despite verbal and visual and tactile cues.    Personal Factors and Comorbidities Comorbidity 3+    Comorbidities HTN, DM, history of cancer (lung), CVA, GERD    Examination-Activity Limitations  Sit;Sleep;Stand;Locomotion Level;Lift    Examination-Participation Restrictions Community Activity;Other   home activity/self care   Stability/Clinical Decision Making Evolving/Moderate complexity  Rehab Potential --   fair to good   PT Frequency --   2x   PT Duration 8 weeks    PT Treatment/Interventions ADLs/Self Care Home Management;Cryotherapy;Therapeutic activities;Iontophoresis 4mg /ml Dexamethasone;Moist Heat;Traction;Therapeutic exercise;Balance training;Functional mobility training;Electrical Stimulation;Stair training;Gait training;Ultrasound;Neuromuscular re-education;Patient/family education;Manual techniques;Vasopneumatic Device;Taping;Dry needling;Joint Manipulations;Spinal Manipulations;Passive range of motion    PT Next Visit Plan Continued attempts to improve postural awareness and HEP knowledge for at home care to supplement in clinic    PT Home Exercise Plan [id number]    Consulted and Agree with Plan of Care Patient           Patient will benefit from skilled therapeutic intervention in order to improve the following deficits and impairments:  Abnormal gait, Decreased endurance, Hypomobility, Increased edema, Decreased activity tolerance, Decreased strength, Pain, Increased fascial restricitons, Decreased balance, Decreased mobility, Difficulty walking, Increased muscle spasms, Postural dysfunction, Impaired flexibility, Decreased coordination, Decreased range of motion  Visit Diagnosis: Cervicalgia  Chronic pain of left ankle  Muscle weakness (generalized)  Abnormal posture  Difficulty in walking, not elsewhere classified  Localized edema     Problem List Patient Active Problem List   Diagnosis Date Noted  . Allergic rhinitis 12/07/2019  . 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 Hyperlipidemia 12/07/2019  . Hypertensive heart and renal disease 12/07/2019  . Overweight 12/07/2019  . Smoker 12/07/2019  . Vitamin D deficiency 12/07/2019  . Hypertensive  emergency 12/02/2018  . Subarachnoid hemorrhage ([place]) 11/29/2018  . S/P lobectomy of lung 04/21/2017  . 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 ([place]) 02/16/2017  . Solitary pulmonary nodule 02/08/2017  . Shortness of breath 12/29/2016    Class: Acute  . Gastroesophageal reflux disease 03/04/2015  . Peripheral vascular disease, unspecified ([place]) 07/12/2013  . Atherosclerosis of native arteries of the extremities with intermittent claudication 07/12/2013  . Chest pain, cardiac 06/08/2011    Class: Acute  . Diabetes mellitus ([place]) 06/08/2011    Class: History of  . HTN (hypertension), benign 06/08/2011    Class: Chronic  . Obesity (BMI 30-39.9) 06/08/2011    Class: History of  . Coronary artery disease 06/08/2011    Class: History of    Scot Jun, PT, DPT, OCS, ATC 05/05/20  10:45 AM    [facility] Physical Therapy [address] [place], [place], [address] Phone: [phone]   Fax:  [phone]  Name: FOTINI LEMUS MRN: [id number] Date of Birth: 05/19/1935

## 2020-05-07 ENCOUNTER — Encounter: Payer: Medicare HMO | Admitting: Physical Therapy

## 2020-05-12 ENCOUNTER — Encounter: Payer: Self-pay | Admitting: Physical Therapy

## 2020-05-12 ENCOUNTER — Ambulatory Visit: Payer: Medicare HMO | Admitting: Physical Therapy

## 2020-05-12 ENCOUNTER — Other Ambulatory Visit: Payer: Self-pay

## 2020-05-12 DIAGNOSIS — R293 Abnormal posture: Secondary | ICD-10-CM | POA: Diagnosis not present

## 2020-05-12 DIAGNOSIS — G8929 Other chronic pain: Secondary | ICD-10-CM

## 2020-05-12 DIAGNOSIS — M6281 Muscle weakness (generalized): Secondary | ICD-10-CM

## 2020-05-12 DIAGNOSIS — R600 Localized edema: Secondary | ICD-10-CM

## 2020-05-12 DIAGNOSIS — M25572 Pain in left ankle and joints of left foot: Secondary | ICD-10-CM

## 2020-05-12 DIAGNOSIS — M542 Cervicalgia: Secondary | ICD-10-CM | POA: Diagnosis not present

## 2020-05-12 DIAGNOSIS — R262 Difficulty in walking, not elsewhere classified: Secondary | ICD-10-CM

## 2020-05-12 NOTE — Therapy (Signed)
[facility] Physical Therapy [address] [place], [place], [address] Phone: [phone]   Fax:  [phone]  Physical Therapy Treatment  Patient Details  Name: [name] [name] MRN: [id number] Date of Birth: 11-29-1934 Referring Provider (PT): Dr. Junius Roads   Encounter Date: 05/12/2020   PT End of Session - 05/12/20 1112    Visit Number 7    Number of Visits 12    Date for PT Re-Evaluation 05/16/20    Authorization Time Period 03/21/2020-05/16/2020 human[employer] request    Progress Note Due on Visit 10    PT Start Time 1015    PT Stop Time 1057    PT Time Calculation (min) 42 min    Activity Tolerance No increased pain;Patient tolerated treatment well    Behavior During Therapy [facility] for tasks assessed/performed           Past Medical History:  Diagnosis Date  . Anginal pain ([place])   . Arthritis   . Asthma   . Coronary artery disease   . Diabetes mellitus   . Dyspnea   . Early cataracts, bilateral   . GERD (gastroesophageal reflux disease)    occ  . Hypertension   . Lung cancer ([place])   . Mass of upper lobe of left lung    mediastinal adenopathy  . Palpitations   . Pneumonia   . Stroke [facility])    mini stroke  . Wears dentures   . Wears glasses     Past Surgical History:  Procedure Laterality Date  . ABDOMINAL HYSTERECTOMY    . ANKLE SURGERY Left    fusion  . COLONOSCOPY W/ BIOPSIES AND POLYPECTOMY    . IR ANGIO EXTRACRAN SEL COM CAROTID INNOMINATE UNI BILAT MOD SED  11/30/2018  . IR ANGIO VERTEBRAL SEL VERTEBRAL BILAT MOD SED  11/30/2018  . IR ANGIOGRAM SELECTIVE EACH ADDITIONAL VESSEL  11/30/2018  . MULTIPLE TOOTH EXTRACTIONS    . TOTAL KNEE ARTHROPLASTY Right   . VIDEO ASSISTED THORACOSCOPY (VATS)/ LOBECTOMY Left 04/21/2017   Procedure: VIDEO ASSISTED THORACOSCOPY (VATS)/LEFT UPPER LOBECTOMY;  Surgeon: Melrose Nakayama, MD;  Location: [place];  Service: Thoracic;  Laterality: Left;  Marland Kitchen VIDEO BRONCHOSCOPY WITH ENDOBRONCHIAL NAVIGATION N/A 01/21/2017     Procedure: VIDEO BRONCHOSCOPY WITH ENDOBRONCHIAL NAVIGATION;  Surgeon: Melrose Nakayama, MD;  Location: [place];  Service: Thoracic;  Laterality: N/A;  . VIDEO BRONCHOSCOPY WITH ENDOBRONCHIAL ULTRASOUND N/A 01/21/2017   Procedure: VIDEO BRONCHOSCOPY WITH ENDOBRONCHIAL ULTRASOUND;  Surgeon: Melrose Nakayama, MD;  [facility];  Service: Thoracic;  Laterality: N/A;    There were no vitals filed for this visit.   Subjective Assessment - 05/12/20 1014    Subjective doing well today.  having a little pain in her shoulder.    Limitations Sitting;Walking;Standing    Patient Stated Goals Reduce pain    Currently in Pain? Yes    Pain Score 5     Pain Location Shoulder    Pain Orientation Right    Pain Descriptors / Indicators Aching    Pain Type Chronic pain    Pain Onset More than a month ago    Pain Frequency Constant    Aggravating Factors  unsure    Pain Relieving Factors estim, heat    Pain Onset More than a month ago                             [facility] Adult PT Treatment/Exercise - 05/12/20 1021  Self-Care   Self-Care Other Self-Care Comments    Other Self-Care Comments  pt brought in home estim unit and educated on application and use.  pt able to demonstrate proper application and removal  of electrodes as well as how to operate the home unit. settings set to pt preference today.      Neck Exercises: Machines for Strengthening   UBE (Upper Arm Bike) lvl 2 4 mins fwd/back each way    Cybex Row 10# x 20 reps      Neck Exercises: Theraband   Shoulder Extension 20 reps;Green    Rows 20 reps;Green                  PT Education - 05/12/20 1112    Education Details see self care    Person(s) Educated Patient    Methods Explanation;Demonstration    Comprehension Verbalized understanding;Returned demonstration            PT Short Term Goals - 05/12/20 1112      PT SHORT TERM GOAL #1   Title Patient will demonstrate independent use  of home exercise program to maintain progress from in clinic treatments.    Baseline 10/11: deferred as pt wanting to set up home TENS unit    Period Weeks    Status On-going    Target Date 05/07/20             PT Long Term Goals - 05/12/20 1113      PT LONG TERM GOAL #1   Title Patient will demonstrate/report pain at worst less than or equal to 2/10 to facilitate minimal limitation in daily activity secondary to pain symptoms.    Time 8    Period Weeks    Status On-going    Target Date 05/16/20      PT LONG TERM GOAL #2   Title Patient will demonstrate independent use of home exercise program to facilitate ability to maintain/progress functional gains from skilled physical therapy services.    Time 8    Period Weeks    Status On-going    Target Date 05/16/20      PT LONG TERM GOAL #3   Title Patient will demonstrate cervical AROM WFL s symptoms to facilitate daily activity including driving, self care at PLOF s limitation due to symptoms.    Time 8    Period Weeks    Status On-going    Target Date 05/16/20      PT LONG TERM GOAL #4   Title Pt. will demonstrate Lt ankle AROM WFL s symptoms to facilitate normal mobility in ambulation.    Time 8    Period Weeks    Status On-going    Target Date 05/16/20      PT LONG TERM GOAL #5   Title Pt. will demonstrate/report self corrected posture independent s cues for reduced strain on affected area to facilitate daily activity.    Time 8    Period Weeks    Status On-going    Target Date 05/16/20                 Plan - 05/12/20 1113    Clinical Impression Statement Pt brought in home TENS unit today so educated on proper application and use.  Deferred STG due to time constraints and questions related to TENS unit.  She is nearing the end of her POC and approved time period with her insurance so will need a recert v/s d/c next visit.  Personal Factors and Comorbidities Comorbidity 3+    Comorbidities HTN, DM, history  of cancer (lung), CVA, GERD    Examination-Activity Limitations Sit;Sleep;Stand;Locomotion Level;Lift    Examination-Participation Restrictions Community Activity;Other   home activity/self care   Stability/Clinical Decision Making Evolving/Moderate complexity    Rehab Potential --   fair to good   PT Frequency --   2x   PT Duration 8 weeks    PT Treatment/Interventions ADLs/Self Care Home Management;Cryotherapy;Therapeutic activities;Iontophoresis 4mg /ml Dexamethasone;Moist Heat;Traction;Therapeutic exercise;Balance training;Functional mobility training;Electrical Stimulation;Stair training;Gait training;Ultrasound;Neuromuscular re-education;Patient/family education;Manual techniques;Vasopneumatic Device;Taping;Dry needling;Joint Manipulations;Spinal Manipulations;Passive range of motion    PT Next Visit Plan needs recert v/s d/c next visit.    PT Home Exercise Plan [id number]    Consulted and Agree with Plan of Care Patient           Patient will benefit from skilled therapeutic intervention in order to improve the following deficits and impairments:  Abnormal gait, Decreased endurance, Hypomobility, Increased edema, Decreased activity tolerance, Decreased strength, Pain, Increased fascial restricitons, Decreased balance, Decreased mobility, Difficulty walking, Increased muscle spasms, Postural dysfunction, Impaired flexibility, Decreased coordination, Decreased range of motion  Visit Diagnosis: Cervicalgia  Chronic pain of left ankle  Muscle weakness (generalized)  Abnormal posture  Difficulty in walking, not elsewhere classified  Localized edema     Problem List Patient Active Problem List   Diagnosis Date Noted  . Allergic rhinitis 12/07/2019  . 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 Hyperlipidemia 12/07/2019  . Hypertensive heart and renal disease 12/07/2019  . Overweight 12/07/2019  . Smoker 12/07/2019  . Vitamin D deficiency 12/07/2019  . Hypertensive emergency  12/02/2018  . Subarachnoid hemorrhage ([place]) 11/29/2018  . S/P lobectomy of lung 04/21/2017  . 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 ([place]) 02/16/2017  . Solitary pulmonary nodule 02/08/2017  . Shortness of breath 12/29/2016    Class: Acute  . Gastroesophageal reflux disease 03/04/2015  . Peripheral vascular disease, unspecified ([place]) 07/12/2013  . Atherosclerosis of native arteries of the extremities with intermittent claudication 07/12/2013  . Chest pain, cardiac 06/08/2011    Class: Acute  . Diabetes mellitus ([place]) 06/08/2011    Class: History of  . HTN (hypertension), benign 06/08/2011    Class: Chronic  . Obesity (BMI 30-39.9) 06/08/2011    Class: History of  . Coronary artery disease 06/08/2011    Class: History of      Laureen Abrahams, PT, DPT 05/12/20 11:17 AM    [facility] Physical Therapy [address] [place], [place], [address] Phone: [phone]   Fax:  [phone]  Name: LEKISHA MCGHEE MRN: [id number] Date of Birth: 1934/12/09

## 2020-05-14 ENCOUNTER — Ambulatory Visit: Payer: Medicare HMO | Admitting: Physical Therapy

## 2020-05-14 ENCOUNTER — Other Ambulatory Visit: Payer: Self-pay

## 2020-05-14 DIAGNOSIS — M25572 Pain in left ankle and joints of left foot: Secondary | ICD-10-CM

## 2020-05-14 DIAGNOSIS — M542 Cervicalgia: Secondary | ICD-10-CM | POA: Diagnosis not present

## 2020-05-14 DIAGNOSIS — R262 Difficulty in walking, not elsewhere classified: Secondary | ICD-10-CM

## 2020-05-14 DIAGNOSIS — R293 Abnormal posture: Secondary | ICD-10-CM | POA: Diagnosis not present

## 2020-05-14 DIAGNOSIS — G8929 Other chronic pain: Secondary | ICD-10-CM

## 2020-05-14 DIAGNOSIS — M6281 Muscle weakness (generalized): Secondary | ICD-10-CM

## 2020-05-14 DIAGNOSIS — R600 Localized edema: Secondary | ICD-10-CM

## 2020-05-14 NOTE — Therapy (Signed)
[facility] Physical Therapy [address] [place], [place], [address] Phone: [phone]   Fax:  [phone]  Physical Therapy Treatment & Discharge Summary  Patient Details  Name: [name] [name] MRN: [id number] Date of Birth: 05/28/1935 Referring Provider (PT): Dr. Junius Roads   Encounter Date: 05/14/2020   PHYSICAL THERAPY DISCHARGE SUMMARY  Visits from Start of Care: 8  Current functional level related to goals / functional outcomes: See below   Remaining deficits: See below   Education / Equipment: HEP & TENS use  Plan: Patient agrees to discharge.  Patient goals were partially met. Patient is being discharged due to being pleased with the current functional level.  ?????           PT End of Session - 05/14/20 1020    Visit Number 8    Number of Visits 12    Date for PT Re-Evaluation 05/16/20    Authorization Time Period 03/21/2020-05/16/2020 human[employer] request    Progress Note Due on Visit 10    PT Start Time 1017    PT Stop Time 1050    PT Time Calculation (min) 33 min    Activity Tolerance No increased pain;Patient tolerated treatment well    Behavior During Therapy [facility] for tasks assessed/performed           Past Medical History:  Diagnosis Date  . Anginal pain ([place])   . Arthritis   . Asthma   . Coronary artery disease   . Diabetes mellitus   . Dyspnea   . Early cataracts, bilateral   . GERD (gastroesophageal reflux disease)    occ  . Hypertension   . Lung cancer ([place])   . Mass of upper lobe of left lung    mediastinal adenopathy  . Palpitations   . Pneumonia   . Stroke [facility])    mini stroke  . Wears dentures   . Wears glasses     Past Surgical History:  Procedure Laterality Date  . ABDOMINAL HYSTERECTOMY    . ANKLE SURGERY Left    fusion  . COLONOSCOPY W/ BIOPSIES AND POLYPECTOMY    . IR ANGIO EXTRACRAN SEL COM CAROTID INNOMINATE UNI BILAT MOD SED  11/30/2018  . IR ANGIO VERTEBRAL SEL VERTEBRAL BILAT MOD SED  11/30/2018    . IR ANGIOGRAM SELECTIVE EACH ADDITIONAL VESSEL  11/30/2018  . MULTIPLE TOOTH EXTRACTIONS    . TOTAL KNEE ARTHROPLASTY Right   . VIDEO ASSISTED THORACOSCOPY (VATS)/ LOBECTOMY Left 04/21/2017   Procedure: VIDEO ASSISTED THORACOSCOPY (VATS)/LEFT UPPER LOBECTOMY;  Surgeon: Melrose Nakayama, MD;  Location: [place];  Service: Thoracic;  Laterality: Left;  Marland Kitchen VIDEO BRONCHOSCOPY WITH ENDOBRONCHIAL NAVIGATION N/A 01/21/2017   Procedure: VIDEO BRONCHOSCOPY WITH ENDOBRONCHIAL NAVIGATION;  Surgeon: Melrose Nakayama, MD;  Location: [place];  Service: Thoracic;  Laterality: N/A;  . VIDEO BRONCHOSCOPY WITH ENDOBRONCHIAL ULTRASOUND N/A 01/21/2017   Procedure: VIDEO BRONCHOSCOPY WITH ENDOBRONCHIAL ULTRASOUND;  Surgeon: Melrose Nakayama, MD;  [facility] OR;  Service: Thoracic;  Laterality: N/A;    There were no vitals filed for this visit.   Subjective Assessment - 05/14/20 1018    Subjective She has used TENS unit & is doing her exercises.    Limitations Sitting;Walking;Standing    Patient Stated Goals Reduce pain    Currently in Pain? Yes    Pain Score 5     Pain Location Shoulder    Pain Orientation Right    Pain Descriptors / Indicators Numbness;Sore    Pain Type Chronic  pain    Pain Onset More than a month ago    Pain Frequency Constant    Aggravating Factors  sitting, driving    Pain Relieving Factors tylenol, heat, ice    Pain Onset More than a month ago              [facility] PT Assessment - 05/14/20 1020      Assessment   Medical Diagnosis Neck pain, Lt ankle pain    Referring Provider (PT) Dr. Junius Roads      Figure 8 Edema   Figure 8 - Right  21.25   inches   Figure 8 - Left  21.5   inches     AROM   Cervical Flexion 60    Cervical Extension 44    Cervical - Right Side Bend 30    Cervical - Left Side Bend 32    Cervical - Right Rotation 58    Cervical - Left Rotation 70      PROM   Left Ankle Dorsiflexion 5    Left Ankle Inversion 31    Left Ankle Eversion 16                          [facility] Adult PT Treatment/Exercise - 05/14/20 0001      Neck Exercises: Theraband   Scapula Retraction 10 reps    Scapula Retraction Limitations cues on motion      Neck Exercises: Standing   Neck Retraction 10 reps;5 secs    Neck Retraction Limitations closed chain with pillow behind head, cues on motion      Ankle Exercises: Seated   ABC's 1 rep                    PT Short Term Goals - 05/12/20 1112      PT SHORT TERM GOAL #1   Title Patient will demonstrate independent use of home exercise program to maintain progress from in clinic treatments.    Baseline 10/11: deferred as pt wanting to set up home TENS unit    Period Weeks    Status On-going    Target Date 05/07/20             PT Long Term Goals - 05/14/20 1022      PT LONG TERM GOAL #1   Title Patient will demonstrate/report pain at worst less than or equal to 2/10 to facilitate minimal limitation in daily activity secondary to pain symptoms.    Baseline NOT MET 05/14/2020  Patient reports pain is better since starting PT. However she still rates pain at 5/10.    Time 8    Period Weeks    Status Not Met      PT LONG TERM GOAL #2   Title Patient will demonstrate independent use of home exercise program to facilitate ability to maintain/progress functional gains from skilled physical therapy services.    Baseline MET 05/14/2020    Time 8    Period Weeks    Status Achieved      PT LONG TERM GOAL #3   Title Patient will demonstrate cervical AROM WFL s symptoms to facilitate daily activity including driving, self care at PLOF s limitation due to symptoms.    Baseline MET 05/14/2020    Time 8    Period Weeks    Status Achieved      PT LONG TERM GOAL #4   Title Pt. will demonstrate Lt ankle  AROM WFL s symptoms to facilitate normal mobility in ambulation.    Baseline Partially MET 05/14/2020    Time 8    Period Weeks    Status Partially Met      PT LONG TERM GOAL #5    Title Pt. will demonstrate/report self corrected posture independent s cues for reduced strain on affected area to facilitate daily activity.    Baseline Not MET 05/14/2020  she requires visual, tactile & verbal cues for proper posture sitting, standing & gait.    Time 8    Period Weeks    Status Not Met                 Plan - 05/14/20 1020    Clinical Impression Statement Patient met or partially met 3 of 5 LTGs set at initial evaluation.  She reports that she is pleased with her progress and function.  PT is discharging today with patient satisfied with current function.    Personal Factors and Comorbidities Comorbidity 3+    Comorbidities HTN, DM, history of cancer (lung), CVA, GERD    Examination-Activity Limitations Sit;Sleep;Stand;Locomotion Level;Lift    Examination-Participation Restrictions Community Activity;Other   home activity/self care   Stability/Clinical Decision Making Evolving/Moderate complexity    Rehab Potential --   fair to good   PT Frequency --   2x   PT Duration 8 weeks    PT Treatment/Interventions ADLs/Self Care Home Management;Cryotherapy;Therapeutic activities;Iontophoresis 49m/ml Dexamethasone;Moist Heat;Traction;Therapeutic exercise;Balance training;Functional mobility training;Electrical Stimulation;Stair training;Gait training;Ultrasound;Neuromuscular re-education;Patient/family education;Manual techniques;Vasopneumatic Device;Taping;Dry needling;Joint Manipulations;Spinal Manipulations;Passive range of motion    PT Next Visit Plan discharge    PT Home Exercise Plan [id number]    Consulted and Agree with Plan of Care Patient           Patient will benefit from skilled therapeutic intervention in order to improve the following deficits and impairments:  Abnormal gait, Decreased endurance, Hypomobility, Increased edema, Decreased activity tolerance, Decreased strength, Pain, Increased fascial restricitons, Decreased balance, Decreased mobility,  Difficulty walking, Increased muscle spasms, Postural dysfunction, Impaired flexibility, Decreased coordination, Decreased range of motion  Visit Diagnosis: Cervicalgia  Chronic pain of left ankle  Muscle weakness (generalized)  Abnormal posture  Difficulty in walking, not elsewhere classified  Localized edema     Problem List Patient Active Problem List   Diagnosis Date Noted  . Allergic rhinitis 12/07/2019  . 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 Hyperlipidemia 12/07/2019  . Hypertensive heart and renal disease 12/07/2019  . Overweight 12/07/2019  . Smoker 12/07/2019  . Vitamin D deficiency 12/07/2019  . Hypertensive emergency 12/02/2018  . Subarachnoid hemorrhage ([place] 11/29/2018  . S/P lobectomy of lung 04/21/2017  . 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 ([place] 02/16/2017  . Solitary pulmonary nodule 02/08/2017  . Shortness of breath 12/29/2016    Class: Acute  . Gastroesophageal reflux disease 03/04/2015  . Peripheral vascular disease, unspecified ([place] 07/12/2013  . Atherosclerosis of native arteries of the extremities with intermittent claudication 07/12/2013  . Chest pain, cardiac 06/08/2011    Class: Acute  . Diabetes mellitus ([place] 06/08/2011    Class: History of  . HTN (hypertension), benign 06/08/2011    Class: Chronic  . Obesity (BMI 30-39.9) 06/08/2011    Class: History of  . Coronary artery disease 06/08/2011    Class: History of    RJamey ReasPT, DPT 05/14/2020, 12:44 PM  [facility] [address] [address]Phone: [phone]  Fax:  [phone]  Name: [name] [name] MRN: [id number] Date of Birth: 22-May-1935

## 2020-05-19 ENCOUNTER — Ambulatory Visit: Payer: Medicare HMO | Attending: Internal Medicine

## 2020-05-19 ENCOUNTER — Encounter: Payer: Medicare HMO | Admitting: Rehabilitative and Restorative Service Providers"

## 2020-05-19 DIAGNOSIS — Z23 Encounter for immunization: Secondary | ICD-10-CM

## 2020-05-19 NOTE — Progress Notes (Signed)
   Covid-19 Vaccination Clinic  Name:  KIERSTEN COSS    MRN: [id number] DOB: Dec 21, 1934  05/19/2020  Ms. Oh was observed post Covid-19 immunization for 15 minutes without incident. She was provided with Vaccine Information Sheet and instruction to access the V-Safe system.   Ms. Mcconnon was instructed to call 911 with any severe reactions post vaccine: Marland Kitchen Difficulty breathing  . Swelling of face and throat  . A fast heartbeat  . A bad rash all over body  . Dizziness and weakness

## 2020-05-21 ENCOUNTER — Encounter: Payer: Medicare HMO | Admitting: Rehabilitative and Restorative Service Providers"

## 2020-05-26 ENCOUNTER — Encounter: Payer: Medicare HMO | Admitting: Rehabilitative and Restorative Service Providers"

## 2020-05-28 ENCOUNTER — Encounter: Payer: Medicare HMO | Admitting: Rehabilitative and Restorative Service Providers"

## 2020-07-08 ENCOUNTER — Ambulatory Visit (HOSPITAL_COMMUNITY)
Admission: RE | Admit: 2020-07-08 | Discharge: 2020-07-08 | Disposition: A | Discharge status: Home / Self Care | Payer: Medicare HMO | Source: Ambulatory Visit | Attending: Internal Medicine | Admitting: Internal Medicine

## 2020-07-08 ENCOUNTER — Encounter (HOSPITAL_COMMUNITY): Payer: Self-pay

## 2020-07-08 ENCOUNTER — Inpatient Hospital Stay: Payer: Medicare HMO | Attending: Internal Medicine

## 2020-07-08 ENCOUNTER — Other Ambulatory Visit: Payer: Self-pay

## 2020-07-08 DIAGNOSIS — Z79899 Other long term (current) drug therapy: Secondary | ICD-10-CM | POA: Diagnosis not present

## 2020-07-08 DIAGNOSIS — Z8673 Personal history of transient ischemic attack (TIA), and cerebral infarction without residual deficits: Secondary | ICD-10-CM | POA: Diagnosis not present

## 2020-07-08 DIAGNOSIS — C3490 Malignant neoplasm of unspecified part of unspecified bronchus or lung: Secondary | ICD-10-CM | POA: Diagnosis not present

## 2020-07-08 DIAGNOSIS — I10 Essential (primary) hypertension: Secondary | ICD-10-CM | POA: Insufficient documentation

## 2020-07-08 DIAGNOSIS — Z7984 Long term (current) use of oral hypoglycemic drugs: Secondary | ICD-10-CM | POA: Diagnosis not present

## 2020-07-08 DIAGNOSIS — C3412 Malignant neoplasm of upper lobe, left bronchus or lung: Secondary | ICD-10-CM | POA: Diagnosis not present

## 2020-07-08 DIAGNOSIS — E119 Type 2 diabetes mellitus without complications: Secondary | ICD-10-CM | POA: Insufficient documentation

## 2020-07-08 DIAGNOSIS — I2510 Atherosclerotic heart disease of native coronary artery without angina pectoris: Secondary | ICD-10-CM | POA: Diagnosis not present

## 2020-07-08 LAB — CBC WITH DIFFERENTIAL (CANCER CENTER ONLY)
Abs Immature Granulocytes: 0.02 10*3/uL (ref 0.00–0.07)
Basophils Absolute: 0 10*3/uL (ref 0.0–0.1)
Basophils Relative: 1 %
Eosinophils Absolute: 0.2 10*3/uL (ref 0.0–0.5)
Eosinophils Relative: 3 %
HCT: 38.1 % (ref 36.0–46.0)
Hemoglobin: 11.9 g/dL — ABNORMAL LOW (ref 12.0–15.0)
Immature Granulocytes: 0 %
Lymphocytes Relative: 33 %
Lymphs Abs: 1.8 10*3/uL (ref 0.7–4.0)
MCH: 25.9 pg — ABNORMAL LOW (ref 26.0–34.0)
MCHC: 31.2 g/dL (ref 30.0–36.0)
MCV: 83 fL (ref 80.0–100.0)
Monocytes Absolute: 0.4 10*3/uL (ref 0.1–1.0)
Monocytes Relative: 7 %
Neutro Abs: 3 10*3/uL (ref 1.7–7.7)
Neutrophils Relative %: 56 %
Platelet Count: 295 10*3/uL (ref 150–400)
RBC: 4.59 MIL/uL (ref 3.87–5.11)
RDW: 15.6 % — ABNORMAL HIGH (ref 11.5–15.5)
WBC Count: 5.3 10*3/uL (ref 4.0–10.5)
nRBC: 0 % (ref 0.0–0.2)

## 2020-07-08 LAB — CMP (CANCER CENTER ONLY)
ALT: 6 U/L (ref 0–44)
AST: 11 U/L — ABNORMAL LOW (ref 15–41)
Albumin: 3.9 g/dL (ref 3.5–5.0)
Alkaline Phosphatase: 59 U/L (ref 38–126)
Anion gap: 8 (ref 5–15)
BUN: 21 mg/dL (ref 8–23)
CO2: 22 mmol/L (ref 22–32)
Calcium: 9.6 mg/dL (ref 8.9–10.3)
Chloride: 112 mmol/L — ABNORMAL HIGH (ref 98–111)
Creatinine: 1.28 mg/dL — ABNORMAL HIGH (ref 0.44–1.00)
GFR, Estimated: 42 mL/min — ABNORMAL LOW (ref >60)
Glucose, Bld: 130 mg/dL — ABNORMAL HIGH (ref 70–99)
Potassium: 4.8 mmol/L (ref 3.5–5.1)
Sodium: 142 mmol/L (ref 135–145)
Total Bilirubin: 0.3 mg/dL (ref 0.3–1.2)
Total Protein: 8.2 g/dL — ABNORMAL HIGH (ref 6.5–8.1)

## 2020-07-10 ENCOUNTER — Other Ambulatory Visit: Payer: Self-pay

## 2020-07-10 ENCOUNTER — Encounter: Payer: Self-pay | Admitting: Internal Medicine

## 2020-07-10 ENCOUNTER — Inpatient Hospital Stay: Payer: Medicare HMO | Admitting: Internal Medicine

## 2020-07-10 ENCOUNTER — Other Ambulatory Visit: Payer: Self-pay | Admitting: Medical Oncology

## 2020-07-10 VITALS — BP 161/69 | HR 86 | Temp 98.2°F | Resp 19 | Ht 69.0 in | Wt 176.8 lb

## 2020-07-10 DIAGNOSIS — C3490 Malignant neoplasm of unspecified part of unspecified bronchus or lung: Secondary | ICD-10-CM

## 2020-07-10 DIAGNOSIS — C3412 Malignant neoplasm of upper lobe, left bronchus or lung: Secondary | ICD-10-CM

## 2020-07-10 NOTE — Progress Notes (Signed)
[place] [phone]   [phone]  OFFICE PROGRESS NOTE  [address], [place] [address]  DIAGNOSIS:  Stage IA (T1b, N0, M0) non-small cell lung cancer, squamous cell carcinoma presented with left upper lobe pulmonary nodule   PRIOR THERAPY: Status post left upper lobectomy with lymph node dissection under the care of Dr. Roxan Hockey on April 21, 2017.  CURRENT THERAPY: Observation.  INTERVAL HISTORY: [name] [name] 85 y.o. female returns to the clinic today for follow-up visit. The patient is feeling fine today with no concerning complaints. She denied having any chest pain, shortness of breath, cough or hemoptysis. She has no nausea, vomiting, diarrhea or constipation. She denied having any headache or visual changes. She lost few pounds since her last visit. The patient is here today for evaluation with repeat CT scan of the chest for restaging of her disease.  MEDICAL HISTORY: Past Medical History:  Diagnosis Date  . Anginal pain ([name] [name])   . Arthritis   . Asthma   . Coronary artery disease   . Diabetes mellitus   . Dyspnea   . Early cataracts, bilateral   . GERD (gastroesophageal reflux disease)    occ  . Hypertension   . Lung cancer ([name] [name])   . Mass of upper lobe of left lung    mediastinal adenopathy  . Palpitations   . Pneumonia   . Stroke [facility])    mini stroke  . Wears dentures   . Wears glasses     ALLERGIES:  is allergic to penicillins, tramadol, and codeine.  MEDICATIONS:  Current Outpatient Medications  Medication Sig Dispense Refill  . albuterol (VENTOLIN HFA) 108 (90 Base) MCG/ACT inhaler albuterol sulfate HFA 90 mcg/actuation aerosol inhaler    . amLODipine (NORVASC) 10 MG tablet Take 10 mg by mouth daily.      Marland Kitchen AQUALANCE LANCETS 30G MISC     . aspirin EC 81 MG tablet Take 81 mg by mouth daily.      Marland Kitchen atorvastatin (LIPITOR) 20 MG tablet Take 20 mg by mouth daily at 6  PM.     . Blood Glucose Calibration (OT ULTRA/FASTTK CNTRL SOLN) SOLN     . Blood Glucose Monitoring Suppl (ONE TOUCH ULTRA 2) w/Device KIT     . Cholecalciferol (VITAMIN D3) 125 MCG (5000 UT) TABS Take 5,000 Units by mouth daily.     . diclofenac (VOLTAREN) 25 MG EC tablet Take 1 tablet (25 mg total) by mouth 2 (two) times daily as needed. 60 tablet 3  . diclofenac Sodium (VOLTAREN) 1 % GEL Apply 4 g topically 4 (four) times daily as needed. 500 g 6  . ergocalciferol (VITAMIN D2) 1.25 MG (50000 UT) capsule ergocalciferol (vitamin D2) 1,250 mcg (50,000 unit) capsule    . hydrALAZINE (APRESOLINE) 25 MG tablet Take 25 mg by mouth 2 (two) times daily.     . isosorbide dinitrate (ISORDIL) 20 MG tablet Take 20 mg by mouth 2 (two) times daily.     Marland Kitchen ketotifen (ZADITOR) 0.025 % ophthalmic solution Place 1 drop into both eyes as needed (itchy eyes).    Elmore Guise Devices (ADJUSTABLE LANCING DEVICE) MISC     . meclizine (ANTIVERT) 25 MG tablet Take 25 mg by mouth 2 (two) times daily as needed for dizziness.    . metFORMIN (GLUCOPHAGE) 500 MG tablet Take 500 mg by mouth 2 (two) times daily.     . methylPREDNISolone (MEDROL DOSEPAK)  4 MG TBPK tablet As directed for 6 days. 21 tablet 0  . metoprolol succinate (TOPROL-XL) 25 MG 24 hr tablet Take 25 mg by mouth every evening.     . nitroGLYCERIN (NITROSTAT) 0.4 MG SL tablet Place 0.4 mg under the tongue every 5 (five) minutes as needed for chest pain.    Marland Kitchen omeprazole (PRILOSEC) 20 MG capsule Take 20 mg by mouth daily.    . ONE TOUCH ULTRA TEST test strip     . pioglitazone (ACTOS) 30 MG tablet Take 30 mg by mouth every evening.     . temazepam (RESTORIL) 15 MG capsule Take 15 mg by mouth at bedtime as needed.     Marland Kitchen tiZANidine (ZANAFLEX) 4 MG tablet     . traMADol (ULTRAM) 50 MG tablet Take 0.5-1 tablets (25-50 mg total) by mouth every 6 (six) hours as needed. 20 tablet 0  . valsartan (DIOVAN) 160 MG tablet Take 160 mg by mouth daily.   5   No current  facility-administered medications for this visit.    SURGICAL HISTORY:  Past Surgical History:  Procedure Laterality Date  . ABDOMINAL HYSTERECTOMY    . ANKLE SURGERY Left    fusion  . COLONOSCOPY W/ BIOPSIES AND POLYPECTOMY    . IR ANGIO EXTRACRAN SEL COM CAROTID INNOMINATE UNI BILAT MOD SED  11/30/2018  . IR ANGIO VERTEBRAL SEL VERTEBRAL BILAT MOD SED  11/30/2018  . IR ANGIOGRAM SELECTIVE EACH ADDITIONAL VESSEL  11/30/2018  . MULTIPLE TOOTH EXTRACTIONS    . TOTAL KNEE ARTHROPLASTY Right   . VIDEO ASSISTED THORACOSCOPY (VATS)/ LOBECTOMY Left 04/21/2017   Procedure: VIDEO ASSISTED THORACOSCOPY (VATS)/LEFT UPPER LOBECTOMY;  Surgeon: [name] Nakayama, MD;  Location: [place];  Service: Thoracic;  Laterality: Left;  Marland Kitchen VIDEO BRONCHOSCOPY WITH ENDOBRONCHIAL NAVIGATION N/A 01/21/2017   Procedure: VIDEO BRONCHOSCOPY WITH ENDOBRONCHIAL NAVIGATION;  Surgeon: [name] Nakayama, MD;  Location: [place];  Service: Thoracic;  Laterality: N/A;  . VIDEO BRONCHOSCOPY WITH ENDOBRONCHIAL ULTRASOUND N/A 01/21/2017   Procedure: VIDEO BRONCHOSCOPY WITH ENDOBRONCHIAL ULTRASOUND;  Surgeon: [name] Nakayama, MD;  Location: [place];  Service: Thoracic;  Laterality: N/A;    REVIEW OF SYSTEMS:  A comprehensive review of systems was negative except for: Constitutional: positive for fatigue and weight loss   PHYSICAL EXAMINATION: General appearance: alert, cooperative and no distress Head: Normocephalic, without obvious abnormality, atraumatic Neck: no adenopathy, no JVD, supple, symmetrical, trachea midline and thyroid not enlarged, symmetric, no tenderness/mass/nodules Lymph nodes: Cervical, supraclavicular, and axillary nodes normal. Resp: clear to auscultation bilaterally Back: symmetric, no curvature. ROM normal. No CVA tenderness. Cardio: regular rate and rhythm, S1, S2 normal, no murmur, click, rub or gallop GI: soft, non-tender; bowel sounds normal; no masses,  no organomegaly Extremities: extremities  normal, atraumatic, no cyanosis or edema  ECOG PERFORMANCE STATUS: 1 - Symptomatic but completely ambulatory  Blood pressure (!) 161/69, pulse 86, temperature 98.2 F (36.8 C), temperature source Tympanic, resp. rate 19, height _0  (1.753 m), weight 176 lb 12.8 oz (80.2 kg), SpO2 97 %.  LABORATORY DATA: Lab Results  Component Value Date   WBC 5.3 07/08/2020   HGB 11.9 (L) 07/08/2020   HCT 38.1 07/08/2020   MCV 83.0 07/08/2020   PLT 295 07/08/2020      Chemistry      Component Value Date/Time   NA 142 07/08/2020 0950   NA 140 06/02/2017 1322   K 4.8 07/08/2020 0950   K 4.2 06/02/2017 1322   CL 112 (H)  07/08/2020 0950   CO2 22 07/08/2020 0950   CO2 20 (L) 06/02/2017 1322   BUN 21 07/08/2020 0950   BUN 22.9 06/02/2017 1322   CREATININE 1.28 (H) 07/08/2020 0950   CREATININE 1.2 (H) 06/02/2017 1322      Component Value Date/Time   CALCIUM 9.6 07/08/2020 0950   CALCIUM 9.3 06/02/2017 1322   ALKPHOS 59 07/08/2020 0950   ALKPHOS 48 06/02/2017 1322   AST 11 (L) 07/08/2020 0950   AST 11 06/02/2017 1322   ALT 6 07/08/2020 0950   ALT 6 06/02/2017 1322   BILITOT 0.3 07/08/2020 0950   BILITOT 0.36 06/02/2017 1322       RADIOGRAPHIC STUDIES: CT Chest Wo Contrast  Result Date: 07/08/2020 CLINICAL DATA:  Primary Cancer Type: Lung Imaging Indication: Routine surveillance Interval therapy since last imaging? No Initial Cancer Diagnosis Date: 01/21/2017; Established by: Biopsy-proven Detailed Pathology: Stage IA non-small cell lung cancer, squamous cell carcinoma. Primary Tumor location: Left upper lobe. Surgeries: Left upper lobectomy 04/21/2017. Chemotherapy: No Immunotherapy? No Radiation therapy? No EXAM: CT CHEST WITHOUT CONTRAST TECHNIQUE: Multidetector CT imaging of the chest was performed following the standard protocol without IV contrast. COMPARISON:  Most recent CT chest 07/09/2019.  01/12/2017 PET-CT. FINDINGS: Cardiovascular: Calcified atheromatous plaque in the thoracic  aorta. No aneurysmal dilation. Stable top-normal heart size without pericardial effusion and extensive 3 vessel coronary artery disease. Mild engorgement of central pulmonary vasculature. Unchanged from prior study. Limited assessment of cardiovascular structures given lack of intravenous contrast. Mediastinum/Nodes: Thoracic inlet structures are normal. No axillary lymphadenopathy. No mediastinal or supraclavicular lymphadenopathy. Lymph nodes throughout the chest, top-normal size without change largest along the RIGHT paratracheal chain a 13-14 mm greatest short axis dimension. Lungs/Pleura: Severe pulmonary emphysema centrilobular predominant also with paraseptal changes. No consolidation. No pleural effusion. Post LEFT upper lobectomy as before. No suspicious new pulmonary nodule. Upper Abdomen: Incidental imaging of upper abdominal contents is unremarkable. No upper abdominal lymphadenopathy or acute finding in the upper abdomen. Musculoskeletal: No acute musculoskeletal process or destructive bone finding. Spinal degenerative changes. IMPRESSION: 1. Post LEFT upper lobectomy as before. No new or suspicious findings or acute cardiopulmonary process. 2. Extensive 3 vessel coronary artery disease. 3. Emphysema and aortic atherosclerosis. Aortic Atherosclerosis (ICD10-I70.0) and Emphysema (ICD10-J43.9). Electronically Signed   By: Zetta Bills M.D.   On: 07/08/2020 10:53    ASSESSMENT AND PLAN: This is a very pleasant 85 years old African-American female with a stage Ia non-small cell lung cancer status post left upper lobectomy with lymph node dissection.   The patient is currently on observation and she is feeling fine today with no concerning complaints. She had repeat CT scan of the chest performed recently. I personally and independently reviewed the scans and discussed the results with the patient today. Her scan showed no concerning findings for disease recurrence or metastasis. I recommended for  the patient to continue on observation with repeat CT scan of the chest in 1 year. She was advised to call immediately if she has any concerning symptoms in the interval. The patient voices understanding of current disease status and treatment options and is in agreement with the current care plan. All questions were answered. The patient knows to call the clinic with any problems, questions or concerns. We can certainly see the patient much sooner if necessary.  Disclaimer: This note was dictated with voice recognition software. Similar sounding words can inadvertently be transcribed and may not be corrected upon review.

## 2020-08-18 ENCOUNTER — Other Ambulatory Visit: Payer: Self-pay

## 2020-08-18 ENCOUNTER — Emergency Department (HOSPITAL_COMMUNITY)
Admission: EM | Admit: 2020-08-18 | Discharge: 2020-08-18 | Disposition: A | Discharge status: Home / Self Care | Payer: Medicare HMO | Attending: Emergency Medicine | Admitting: Emergency Medicine

## 2020-08-18 ENCOUNTER — Encounter (HOSPITAL_COMMUNITY): Payer: Self-pay | Admitting: *Deleted

## 2020-08-18 DIAGNOSIS — K219 Gastro-esophageal reflux disease without esophagitis: Secondary | ICD-10-CM | POA: Diagnosis not present

## 2020-08-18 DIAGNOSIS — Z5321 Procedure and treatment not carried out due to patient leaving prior to being seen by health care provider: Secondary | ICD-10-CM | POA: Insufficient documentation

## 2020-08-18 DIAGNOSIS — R079 Chest pain, unspecified: Secondary | ICD-10-CM | POA: Diagnosis present

## 2020-08-18 LAB — CBC
HCT: 36.3 % (ref 36.0–46.0)
Hemoglobin: 11.1 g/dL — ABNORMAL LOW (ref 12.0–15.0)
MCH: 26 pg (ref 26.0–34.0)
MCHC: 30.6 g/dL (ref 30.0–36.0)
MCV: 85 fL (ref 80.0–100.0)
Platelets: 292 10*3/uL (ref 150–400)
RBC: 4.27 MIL/uL (ref 3.87–5.11)
RDW: 16 % — ABNORMAL HIGH (ref 11.5–15.5)
WBC: 6.4 10*3/uL (ref 4.0–10.5)
nRBC: 0 % (ref 0.0–0.2)

## 2020-08-18 LAB — BASIC METABOLIC PANEL
Anion gap: 11 (ref 5–15)
BUN: 18 mg/dL (ref 8–23)
CO2: 22 mmol/L (ref 22–32)
Calcium: 9 mg/dL (ref 8.9–10.3)
Chloride: 109 mmol/L (ref 98–111)
Creatinine, Ser: 1.19 mg/dL — ABNORMAL HIGH (ref 0.44–1.00)
GFR, Estimated: 45 mL/min — ABNORMAL LOW (ref >60)
Glucose, Bld: 176 mg/dL — ABNORMAL HIGH (ref 70–99)
Potassium: 3.9 mmol/L (ref 3.5–5.1)
Sodium: 142 mmol/L (ref 135–145)

## 2020-08-18 LAB — TROPONIN I (HIGH SENSITIVITY): Troponin I (High Sensitivity): 13 ng/L (ref <18)

## 2020-08-18 NOTE — ED Triage Notes (Signed)
Pt had onset of dull chest pain after eating a sausage biscuit this morning. Hx of acid reflux. Has n/v and belching pta, denies sob. No acute distress is noted at triage.

## 2020-08-18 NOTE — ED Notes (Signed)
Pt stated she has an appointment and is leaving.

## 2020-09-26 ENCOUNTER — Ambulatory Visit
Admission: RE | Admit: 2020-09-26 | Discharge: 2020-09-26 | Disposition: A | Discharge status: Home / Self Care | Payer: Medicare HMO | Source: Ambulatory Visit | Attending: Cardiovascular Disease | Admitting: Cardiovascular Disease

## 2020-09-26 ENCOUNTER — Other Ambulatory Visit: Payer: Self-pay | Admitting: Cardiovascular Disease

## 2020-09-26 DIAGNOSIS — G8929 Other chronic pain: Secondary | ICD-10-CM

## 2020-09-26 DIAGNOSIS — M25572 Pain in left ankle and joints of left foot: Secondary | ICD-10-CM

## 2020-09-26 DIAGNOSIS — M25472 Effusion, left ankle: Secondary | ICD-10-CM

## 2021-01-13 ENCOUNTER — Ambulatory Visit (INDEPENDENT_AMBULATORY_CARE_PROVIDER_SITE_OTHER): Payer: Medicare HMO | Admitting: Family Medicine

## 2021-01-13 ENCOUNTER — Other Ambulatory Visit: Payer: Self-pay

## 2021-01-13 DIAGNOSIS — M25572 Pain in left ankle and joints of left foot: Secondary | ICD-10-CM

## 2021-01-13 DIAGNOSIS — M25571 Pain in right ankle and joints of right foot: Secondary | ICD-10-CM

## 2021-01-13 MED ORDER — DICLOFENAC SODIUM 1 % EX GEL: 4.0000 g | Freq: Four times a day (QID) | CUTANEOUS | 6 refills | Status: AC | PRN

## 2021-01-13 NOTE — Addendum Note (Signed)
Addended by: Hortencia Pilar on: 01/13/2021 12:54 PM   Modules accepted: Orders

## 2021-01-13 NOTE — Progress Notes (Signed)
Office Visit Note   Patient: [name] [name]           Date of Birth: [birth year]           MRN: [id number] Visit Date: 01/13/2021 Requested by: Willey Blade, [address] [place],  [place] [address] PCP: Willey Blade, MD  Subjective: Chief Complaint  Patient presents with   Right Ankle - Pain   Left Ankle - Pain    Pain and swelling in both ankles, left more than right. Was seen for the left ankle last year and had xrays (here). H/o surgery on the left ankle many years ago. No recent injury. Ambulates with a cane when she is out of the house - uses a walker or cane inside the house.    HPI: She is here with left greater than right ankle pain and swelling.  Intermittent symptoms, some days worse than the other.  The left leg tends to bother her more.  She uses Voltaren gel with some relief.  Symptoms are manageable but a nuisance.  She wonders whether some of her medications could be causing the swelling.                ROS:   All other systems were reviewed and are negative.  Objective: Vital Signs: There were no vitals taken for this visit.  Physical Exam:  General:  Alert and oriented, in no acute distress. Pulm:  Breathing unlabored. Psy:  Normal mood, congruent affect. Skin: No erythema Ankles: She has slight swelling of the joints, intra-articular.  No significant peripheral edema.  Both ankles are tender over the anterolateral joint line.  Ligaments feel stable.  Imaging: No results found.  Assessment & Plan: Left greater than right ankle pain most likely due to DJD -Supportive shoes, turmeric, glucosamine.  Injection if symptoms worsen.     Procedures: No procedures performed        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   Overweight 12/07/2019    Smoker 12/07/2019   Vitamin D deficiency 12/07/2019   Hypertensive emergency 12/02/2018   Subarachnoid hemorrhage ([place]) 11/29/2018   S/P lobectomy of lung 04/21/2017   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 ([place]) 02/16/2017   Solitary pulmonary nodule 02/08/2017   Shortness of breath 12/29/2016    Class: Acute   Gastroesophageal reflux disease 03/04/2015   Peripheral vascular disease, unspecified ([place]) 07/12/2013   Atherosclerosis of native arteries of the extremities with intermittent claudication 07/12/2013   Chest pain, cardiac 06/08/2011    Class: Acute   Diabetes mellitus ([place]) 06/08/2011    Class: History of   HTN (hypertension), benign 06/08/2011    Class: Chronic   Obesity (BMI 30-39.9) 06/08/2011    Class: History of   Coronary artery disease 06/08/2011    Class: History of   Past Medical History:  Diagnosis Date   Anginal pain ([place])    Arthritis    Asthma    Coronary artery disease    Diabetes mellitus    Dyspnea    Early cataracts, bilateral    GERD (gastroesophageal reflux disease)    occ   Hypertension    Lung cancer ([place])    Mass of upper lobe of left lung    mediastinal adenopathy   Palpitations    Pneumonia  Stroke [facility])    mini stroke   Wears dentures    Wears glasses     Family History  Problem Relation Age of Onset   Diabetes Mother    Diabetes Sister    Hyperlipidemia Sister    Diabetes Brother    Diabetes Son    Cancer Neg Hx     Past Surgical History:  Procedure Laterality Date   ABDOMINAL HYSTERECTOMY     ANKLE SURGERY Left    fusion   COLONOSCOPY W/ BIOPSIES AND POLYPECTOMY     IR ANGIO EXTRACRAN SEL COM CAROTID INNOMINATE UNI BILAT MOD SED  11/30/2018   IR ANGIO VERTEBRAL SEL VERTEBRAL BILAT MOD SED  11/30/2018   IR ANGIOGRAM SELECTIVE EACH ADDITIONAL VESSEL  11/30/2018   MULTIPLE TOOTH EXTRACTIONS     TOTAL KNEE ARTHROPLASTY Right    VIDEO ASSISTED THORACOSCOPY (VATS)/ LOBECTOMY Left 04/21/2017   Procedure:  VIDEO ASSISTED THORACOSCOPY (VATS)/LEFT UPPER LOBECTOMY;  Surgeon: Melrose Nakayama, MD;  Location: [facility];  Service: Thoracic;  Laterality: Left;   VIDEO BRONCHOSCOPY WITH ENDOBRONCHIAL NAVIGATION N/A 01/21/2017   Procedure: VIDEO BRONCHOSCOPY WITH ENDOBRONCHIAL NAVIGATION;  Surgeon: Melrose Nakayama, MD;  [facility];  Service: Thoracic;  Laterality: N/A;   VIDEO BRONCHOSCOPY WITH ENDOBRONCHIAL ULTRASOUND N/A 01/21/2017   Procedure: VIDEO BRONCHOSCOPY WITH ENDOBRONCHIAL ULTRASOUND;  Surgeon: Melrose Nakayama, MD;  [facility];  Service: Thoracic;  Laterality: N/A;   Social History   Occupational History   Not on file  Tobacco Use   Smoking status: Former    Packs/day: 0.25    Years: 12.00    Pack years: 3.00    Types: Cigarettes    Quit date: 03/2017    Years since quitting: 3.8   Smokeless tobacco: Never  Vaping Use   Vaping Use: Never used  Substance and Sexual Activity   Alcohol use: No   Drug use: No   Sexual activity: Not Currently

## 2021-01-13 NOTE — Patient Instructions (Signed)
  For ankles:  Shoes with arch supports.  Glucosamine Sulfate:  take 1,000 mg twice daily  Turmeric:  Take 500 mg twice daily

## 2021-02-25 ENCOUNTER — Encounter: Payer: Self-pay | Admitting: Family Medicine

## 2021-02-25 ENCOUNTER — Ambulatory Visit (INDEPENDENT_AMBULATORY_CARE_PROVIDER_SITE_OTHER): Payer: Medicare HMO | Admitting: Family Medicine

## 2021-02-25 ENCOUNTER — Other Ambulatory Visit: Payer: Self-pay

## 2021-02-25 DIAGNOSIS — M25572 Pain in left ankle and joints of left foot: Secondary | ICD-10-CM

## 2021-02-25 NOTE — Progress Notes (Signed)
Office Visit Note   Patient: [name] [name]           Date of Birth: Dec 05, 1934           MRN: [id number] Visit Date: 02/25/2021 Requested by: Willey Blade, [address] [place] [address] [place],  [place] [address] PCP: Willey Blade, MD  Subjective: Chief Complaint  Patient presents with   Left Ankle - Pain    Continues to have swelling and pain in the ankle. That pain goes up into the lower leg, just below the knee.     HPI: She is here with left leg pain.  Pain is primarily just below the lateral aspect of the knee.  Pain radiates into the ankle sometimes.  She continues to have some swelling in her ankles.  The left one bothers her more than the right.  She recently had cataract surgery and is very pleased with her results.              ROS:   All other systems were reviewed and are negative.  Objective: Vital Signs: There were no vitals taken for this visit.  Physical Exam:  General:  Alert and oriented, in no acute distress. Pulm:  Breathing unlabored. Psy:  Normal mood, congruent affect. Skin: She has 1+ edema in the ankle to mid shin. Left leg: She has some tenderness near the common peroneal nerve, negative Tinel's.  Ankle dorsiflexion, eversion and plantar flexion strength normal.  No fluid in the knee joint, no tenderness around the lateral joint line.    Imaging: No results found.  Assessment & Plan: Left lower leg pain, etiology uncertain.  Could be peroneal neuropathy or possibly pain from venous insufficiency. -Elected to try compression stockings.  If no relief, then physical therapy.  If that still does not help, then possibly nerve studies.     Procedures: No procedures performed        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   Overweight 12/07/2019   Smoker  12/07/2019   Vitamin D deficiency 12/07/2019   Hypertensive emergency 12/02/2018   Subarachnoid hemorrhage ([place]) 11/29/2018   S/P lobectomy of lung 04/21/2017   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 ([place]) 02/16/2017   Solitary pulmonary nodule 02/08/2017   Shortness of breath 12/29/2016    Class: Acute   Gastroesophageal reflux disease 03/04/2015   Peripheral vascular disease, unspecified ([place]) 07/12/2013   Atherosclerosis of native arteries of the extremities with intermittent claudication 07/12/2013   Chest pain, cardiac 06/08/2011    Class: Acute   Diabetes mellitus ([place]) 06/08/2011    Class: History of   HTN (hypertension), benign 06/08/2011    Class: Chronic   Obesity (BMI 30-39.9) 06/08/2011    Class: History of   Coronary artery disease 06/08/2011    Class: History of   Past Medical History:  Diagnosis Date   Anginal pain ([place])    Arthritis    Asthma    Coronary artery disease    Diabetes mellitus    Dyspnea    Early cataracts, bilateral    GERD (gastroesophageal reflux disease)    occ   Hypertension    Lung cancer ([place])    Mass of upper lobe of left lung    mediastinal adenopathy   Palpitations    Pneumonia  Stroke [facility])    mini stroke   Wears dentures    Wears glasses     Family History  Problem Relation Age of Onset   Diabetes Mother    Diabetes Sister    Hyperlipidemia Sister    Diabetes Brother    Diabetes Son    Cancer Neg Hx     Past Surgical History:  Procedure Laterality Date   ABDOMINAL HYSTERECTOMY     ANKLE SURGERY Left    fusion   COLONOSCOPY W/ BIOPSIES AND POLYPECTOMY     IR ANGIO EXTRACRAN SEL COM CAROTID INNOMINATE UNI BILAT MOD SED  11/30/2018   IR ANGIO VERTEBRAL SEL VERTEBRAL BILAT MOD SED  11/30/2018   IR ANGIOGRAM SELECTIVE EACH ADDITIONAL VESSEL  11/30/2018   MULTIPLE TOOTH EXTRACTIONS     TOTAL KNEE ARTHROPLASTY Right    VIDEO ASSISTED THORACOSCOPY (VATS)/ LOBECTOMY Left 04/21/2017   Procedure: VIDEO  ASSISTED THORACOSCOPY (VATS)/LEFT UPPER LOBECTOMY;  Surgeon: Melrose Nakayama, MD;  Location: [facility];  Service: Thoracic;  Laterality: Left;   VIDEO BRONCHOSCOPY WITH ENDOBRONCHIAL NAVIGATION N/A 01/21/2017   Procedure: VIDEO BRONCHOSCOPY WITH ENDOBRONCHIAL NAVIGATION;  Surgeon: Melrose Nakayama, MD;  [facility];  Service: Thoracic;  Laterality: N/A;   VIDEO BRONCHOSCOPY WITH ENDOBRONCHIAL ULTRASOUND N/A 01/21/2017   Procedure: VIDEO BRONCHOSCOPY WITH ENDOBRONCHIAL ULTRASOUND;  Surgeon: Melrose Nakayama, MD;  [facility];  Service: Thoracic;  Laterality: N/A;   Social History   Occupational History   Not on file  Tobacco Use   Smoking status: Former    Packs/day: 0.25    Years: 12.00    Pack years: 3.00    Types: Cigarettes    Quit date: 03/2017    Years since quitting: 3.9   Smokeless tobacco: Never  Vaping Use   Vaping Use: Never used  Substance and Sexual Activity   Alcohol use: No   Drug use: No   Sexual activity: Not Currently

## 2021-03-16 ENCOUNTER — Ambulatory Visit
Admission: RE | Admit: 2021-03-16 | Discharge: 2021-03-16 | Disposition: A | Discharge status: Home / Self Care | Payer: Medicare HMO | Source: Ambulatory Visit | Attending: Cardiovascular Disease | Admitting: Cardiovascular Disease

## 2021-03-16 ENCOUNTER — Other Ambulatory Visit: Payer: Self-pay | Admitting: Cardiovascular Disease

## 2021-03-16 ENCOUNTER — Other Ambulatory Visit: Payer: Self-pay

## 2021-03-16 DIAGNOSIS — J40 Bronchitis, not specified as acute or chronic: Secondary | ICD-10-CM

## 2021-07-07 ENCOUNTER — Inpatient Hospital Stay: Payer: Medicare HMO

## 2021-07-08 ENCOUNTER — Ambulatory Visit (HOSPITAL_COMMUNITY)
Admission: RE | Admit: 2021-07-08 | Discharge: 2021-07-08 | Disposition: A | Discharge status: Home / Self Care | Payer: Medicare HMO | Source: Ambulatory Visit | Attending: Internal Medicine | Admitting: Internal Medicine

## 2021-07-08 ENCOUNTER — Other Ambulatory Visit: Payer: Self-pay

## 2021-07-08 ENCOUNTER — Inpatient Hospital Stay: Payer: Medicare HMO | Attending: Internal Medicine

## 2021-07-08 DIAGNOSIS — E1136 Type 2 diabetes mellitus with diabetic cataract: Secondary | ICD-10-CM | POA: Insufficient documentation

## 2021-07-08 DIAGNOSIS — Z90710 Acquired absence of both cervix and uterus: Secondary | ICD-10-CM | POA: Insufficient documentation

## 2021-07-08 DIAGNOSIS — C3490 Malignant neoplasm of unspecified part of unspecified bronchus or lung: Secondary | ICD-10-CM

## 2021-07-08 DIAGNOSIS — C3412 Malignant neoplasm of upper lobe, left bronchus or lung: Secondary | ICD-10-CM | POA: Insufficient documentation

## 2021-07-08 DIAGNOSIS — I10 Essential (primary) hypertension: Secondary | ICD-10-CM | POA: Insufficient documentation

## 2021-07-08 LAB — CBC WITH DIFFERENTIAL (CANCER CENTER ONLY)
Abs Immature Granulocytes: 0.02 10*3/uL (ref 0.00–0.07)
Basophils Absolute: 0 10*3/uL (ref 0.0–0.1)
Basophils Relative: 0 %
Eosinophils Absolute: 0.1 10*3/uL (ref 0.0–0.5)
Eosinophils Relative: 2 %
HCT: 34.5 % — ABNORMAL LOW (ref 36.0–46.0)
Hemoglobin: 11 g/dL — ABNORMAL LOW (ref 12.0–15.0)
Immature Granulocytes: 0 %
Lymphocytes Relative: 37 %
Lymphs Abs: 2.1 10*3/uL (ref 0.7–4.0)
MCH: 26.5 pg (ref 26.0–34.0)
MCHC: 31.9 g/dL (ref 30.0–36.0)
MCV: 83.1 fL (ref 80.0–100.0)
Monocytes Absolute: 0.4 10*3/uL (ref 0.1–1.0)
Monocytes Relative: 8 %
Neutro Abs: 2.9 10*3/uL (ref 1.7–7.7)
Neutrophils Relative %: 53 %
Platelet Count: 275 10*3/uL (ref 150–400)
RBC: 4.15 MIL/uL (ref 3.87–5.11)
RDW: 15.6 % — ABNORMAL HIGH (ref 11.5–15.5)
WBC Count: 5.6 10*3/uL (ref 4.0–10.5)
nRBC: 0 % (ref 0.0–0.2)

## 2021-07-08 LAB — CMP (CANCER CENTER ONLY)
ALT: 6 U/L (ref 0–44)
AST: 13 U/L — ABNORMAL LOW (ref 15–41)
Albumin: 3.7 g/dL (ref 3.5–5.0)
Alkaline Phosphatase: 61 U/L (ref 38–126)
Anion gap: 11 (ref 5–15)
BUN: 23 mg/dL (ref 8–23)
CO2: 20 mmol/L — ABNORMAL LOW (ref 22–32)
Calcium: 8.8 mg/dL — ABNORMAL LOW (ref 8.9–10.3)
Chloride: 112 mmol/L — ABNORMAL HIGH (ref 98–111)
Creatinine: 1.28 mg/dL — ABNORMAL HIGH (ref 0.44–1.00)
GFR, Estimated: 41 mL/min — ABNORMAL LOW (ref >60)
Glucose, Bld: 116 mg/dL — ABNORMAL HIGH (ref 70–99)
Potassium: 3.9 mmol/L (ref 3.5–5.1)
Sodium: 143 mmol/L (ref 135–145)
Total Bilirubin: 0.4 mg/dL (ref 0.3–1.2)
Total Protein: 7.5 g/dL (ref 6.5–8.1)

## 2021-07-09 ENCOUNTER — Encounter: Payer: Self-pay | Admitting: Internal Medicine

## 2021-07-09 ENCOUNTER — Inpatient Hospital Stay: Payer: Medicare HMO | Admitting: Internal Medicine

## 2021-07-09 VITALS — BP 162/71 | HR 78 | Temp 98.3°F | Resp 20 | Ht 69.0 in | Wt 164.0 lb

## 2021-07-09 DIAGNOSIS — C3490 Malignant neoplasm of unspecified part of unspecified bronchus or lung: Secondary | ICD-10-CM

## 2021-07-09 DIAGNOSIS — Z90710 Acquired absence of both cervix and uterus: Secondary | ICD-10-CM | POA: Diagnosis not present

## 2021-07-09 DIAGNOSIS — C3412 Malignant neoplasm of upper lobe, left bronchus or lung: Secondary | ICD-10-CM | POA: Diagnosis not present

## 2021-07-09 DIAGNOSIS — I10 Essential (primary) hypertension: Secondary | ICD-10-CM | POA: Diagnosis not present

## 2021-07-09 DIAGNOSIS — E1136 Type 2 diabetes mellitus with diabetic cataract: Secondary | ICD-10-CM | POA: Diagnosis not present

## 2021-07-09 NOTE — Progress Notes (Signed)
[facility] [phone]   [phone]  OFFICE PROGRESS NOTE  Willey Blade, [place] [address]  DIAGNOSIS:  Stage IA (T1b, N0, M0) non-small cell lung cancer, squamous cell carcinoma presented with left upper lobe pulmonary nodule   PRIOR THERAPY: Status post left upper lobectomy with lymph node dissection under the care of Dr. Roxan Hockey on April 21, 2017.  CURRENT THERAPY: Observation.  INTERVAL HISTORY: [name] [name] 86 y.o. female returns to the clinic today for follow-up visit accompanied by her daughter.  The patient is feeling fine today with no concerning complaints except for mild fatigue and few pounds of weight loss recently.  She denied having any current chest pain, shortness of breath, cough or hemoptysis.  She denied having any fever or chills.  She has no nausea, vomiting, diarrhea or constipation.  She has no headache or visual changes.  She is here today for evaluation with repeat CT scan of the chest for restaging of her disease.  MEDICAL HISTORY: Past Medical History:  Diagnosis Date   Anginal pain ([place])    Arthritis    Asthma    Coronary artery disease    Diabetes mellitus    Dyspnea    Early cataracts, bilateral    GERD (gastroesophageal reflux disease)    occ   Hypertension    Lung cancer ([facility])    Mass of upper lobe of left lung    mediastinal adenopathy   Palpitations    Pneumonia    Stroke [facility])    mini stroke   Wears dentures    Wears glasses     ALLERGIES:  is allergic to penicillins, tramadol, and codeine.  MEDICATIONS:  Current Outpatient Medications  Medication Sig Dispense Refill   albuterol (VENTOLIN HFA) 108 (90 Base) MCG/ACT inhaler albuterol sulfate HFA 90 mcg/actuation aerosol inhaler     amLODipine (NORVASC) 10 MG tablet Take 10 mg by mouth daily.       AQUALANCE LANCETS 30G MISC      aspirin EC 81 MG tablet Take 81 mg by mouth daily.        atorvastatin (LIPITOR) 20 MG tablet Take 20 mg by mouth daily at 6 PM.      Blood Glucose Calibration (OT ULTRA/FASTTK CNTRL SOLN) SOLN      Blood Glucose Monitoring Suppl (ONE TOUCH ULTRA 2) w/Device KIT      Cholecalciferol (VITAMIN D3) 125 MCG (5000 UT) TABS Take 5,000 Units by mouth daily.      diclofenac (VOLTAREN) 25 MG EC tablet Take 1 tablet (25 mg total) by mouth 2 (two) times daily as needed. 60 tablet 3   diclofenac Sodium (VOLTAREN) 1 % GEL Apply 4 g topically 4 (four) times daily as needed. 500 g 6   ergocalciferol (VITAMIN D2) 1.25 MG (50000 UT) capsule ergocalciferol (vitamin D2) 1,250 mcg (50,000 unit) capsule     hydrALAZINE (APRESOLINE) 25 MG tablet Take 25 mg by mouth 2 (two) times daily.      isosorbide dinitrate (ISORDIL) 20 MG tablet Take 20 mg by mouth 2 (two) times daily.      Lancet Devices (ADJUSTABLE LANCING DEVICE) MISC      meclizine (ANTIVERT) 25 MG tablet Take 25 mg by mouth 2 (two) times daily as needed for dizziness.     metFORMIN (GLUCOPHAGE) 500 MG tablet Take 500 mg by mouth 2 (two) times daily.      methylPREDNISolone (MEDROL DOSEPAK) 4  MG TBPK tablet As directed for 6 days. 21 tablet 0   metoprolol succinate (TOPROL-XL) 25 MG 24 hr tablet Take 25 mg by mouth every evening.      nitroGLYCERIN (NITROSTAT) 0.4 MG SL tablet Place 0.4 mg under the tongue every 5 (five) minutes as needed for chest pain.     omeprazole (PRILOSEC) 20 MG capsule Take 20 mg by mouth daily.     ONE TOUCH ULTRA TEST test strip      pioglitazone (ACTOS) 30 MG tablet Take 30 mg by mouth every evening.      tiZANidine (ZANAFLEX) 4 MG tablet      valsartan (DIOVAN) 160 MG tablet Take 160 mg by mouth daily.   5   temazepam (RESTORIL) 15 MG capsule Take 15 mg by mouth at bedtime as needed.  (Patient not taking: Reported on 07/09/2021)     No current facility-administered medications for this visit.    SURGICAL HISTORY:  Past Surgical History:  Procedure Laterality Date   ABDOMINAL  HYSTERECTOMY     ANKLE SURGERY Left    fusion   COLONOSCOPY W/ BIOPSIES AND POLYPECTOMY     IR ANGIO EXTRACRAN SEL COM CAROTID INNOMINATE UNI BILAT MOD SED  11/30/2018   IR ANGIO VERTEBRAL SEL VERTEBRAL BILAT MOD SED  11/30/2018   IR ANGIOGRAM SELECTIVE EACH ADDITIONAL VESSEL  11/30/2018   MULTIPLE TOOTH EXTRACTIONS     TOTAL KNEE ARTHROPLASTY Right    VIDEO ASSISTED THORACOSCOPY (VATS)/ LOBECTOMY Left 04/21/2017   Procedure: VIDEO ASSISTED THORACOSCOPY (VATS)/LEFT UPPER LOBECTOMY;  Surgeon: Melrose Nakayama, MD;  Location: [facility];  Service: Thoracic;  Laterality: Left;   VIDEO BRONCHOSCOPY WITH ENDOBRONCHIAL NAVIGATION N/A 01/21/2017   Procedure: VIDEO BRONCHOSCOPY WITH ENDOBRONCHIAL NAVIGATION;  Surgeon: Melrose Nakayama, MD;  Location: [facility];  Service: Thoracic;  Laterality: N/A;   VIDEO BRONCHOSCOPY WITH ENDOBRONCHIAL ULTRASOUND N/A 01/21/2017   Procedure: VIDEO BRONCHOSCOPY WITH ENDOBRONCHIAL ULTRASOUND;  Surgeon: Melrose Nakayama, MD;  [facility];  Service: Thoracic;  Laterality: N/A;    REVIEW OF SYSTEMS:  A comprehensive review of systems was negative except for: Constitutional: positive for fatigue and weight loss   PHYSICAL EXAMINATION: General appearance: alert, cooperative, and no distress Head: Normocephalic, without obvious abnormality, atraumatic Neck: no adenopathy, no JVD, supple, symmetrical, trachea midline, and thyroid not enlarged, symmetric, no tenderness/mass/nodules Lymph nodes: Cervical, supraclavicular, and axillary nodes normal. Resp: clear to auscultation bilaterally Back: symmetric, no curvature. ROM normal. No CVA tenderness. Cardio: regular rate and rhythm, S1, S2 normal, no murmur, click, rub or gallop GI: soft, non-tender; bowel sounds normal; no masses,  no organomegaly Extremities: extremities normal, atraumatic, no cyanosis or edema  ECOG PERFORMANCE STATUS: 1 - Symptomatic but completely ambulatory  Blood pressure (!) 162/71, pulse 78,  temperature 98.3 F (36.8 C), temperature source Tympanic, resp. rate 20, height 5' 9"  (1.753 m), weight 164 lb (74.4 kg), SpO2 92 %.  LABORATORY DATA: Lab Results  Component Value Date   WBC 5.6 07/08/2021   HGB 11.0 (L) 07/08/2021   HCT 34.5 (L) 07/08/2021   MCV 83.1 07/08/2021   PLT 275 07/08/2021      Chemistry      Component Value Date/Time   NA 143 07/08/2021 1452   NA 140 06/02/2017 1322   K 3.9 07/08/2021 1452   K 4.2 06/02/2017 1322   CL 112 (H) 07/08/2021 1452   CO2 20 (L) 07/08/2021 1452   CO2 20 (L) 06/02/2017 1322   BUN 23  07/08/2021 1452   BUN 22.9 06/02/2017 1322   CREATININE 1.28 (H) 07/08/2021 1452   CREATININE 1.2 (H) 06/02/2017 1322      Component Value Date/Time   CALCIUM 8.8 (L) 07/08/2021 1452   CALCIUM 9.3 06/02/2017 1322   ALKPHOS 61 07/08/2021 1452   ALKPHOS 48 06/02/2017 1322   AST 13 (L) 07/08/2021 1452   AST 11 06/02/2017 1322   ALT 6 07/08/2021 1452   ALT 6 06/02/2017 1322   BILITOT 0.4 07/08/2021 1452   BILITOT 0.36 06/02/2017 1322       RADIOGRAPHIC STUDIES: No results found.   ASSESSMENT AND PLAN: This is a very pleasant 86 years old African-American female with stage IA non-small cell lung cancer status post left upper lobectomy with lymph node dissection.   The patient has been in observation since September 2018 and she is feeling fine. She had repeat CT scan of the chest performed yesterday. I personally and independently reviewed the scan images and discussed the results with the patient today.  The final report is still pending.  I do not see any clear evidence for disease recurrence but would wait for the final report from radiology for confirmation. I recommended for her to continue on observation with repeat CT scan of the chest in 1 year. The patient was advised to call immediately if she has any concerning symptoms in the interval. The patient voices understanding of current disease status and treatment options and is in  agreement with the current care plan. All questions were answered. The patient knows to call the clinic with any problems, questions or concerns. We can certainly see the patient much sooner if necessary.  Disclaimer: This note was dictated with voice recognition software. Similar sounding words can inadvertently be transcribed and may not be corrected upon review.

## 2021-07-16 ENCOUNTER — Telehealth: Payer: Self-pay | Admitting: Internal Medicine

## 2021-07-16 NOTE — Telephone Encounter (Signed)
Sch per 12/9 los, pt aware.

## 2021-07-30 ENCOUNTER — Other Ambulatory Visit: Payer: Self-pay

## 2021-07-30 ENCOUNTER — Ambulatory Visit (INDEPENDENT_AMBULATORY_CARE_PROVIDER_SITE_OTHER): Payer: Medicare HMO

## 2021-07-30 ENCOUNTER — Ambulatory Visit: Payer: Medicare HMO | Admitting: Physician Assistant

## 2021-07-30 DIAGNOSIS — M542 Cervicalgia: Secondary | ICD-10-CM

## 2021-07-30 DIAGNOSIS — M25511 Pain in right shoulder: Secondary | ICD-10-CM | POA: Diagnosis not present

## 2021-07-30 MED ORDER — METHOCARBAMOL 500 MG PO TABS: 500.0000 mg | ORAL_TABLET | Freq: Three times a day (TID) | ORAL | 0 refills | Status: DC | PRN

## 2021-07-30 NOTE — Progress Notes (Signed)
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  She is also had difficulties with balance.  She has been followed by Dr. Junius Roads in the past.  Over the weekend she thinks she slipped on some ice and fell onto her neck and right shoulder.  She denies any loss of consciousness any confusion any blurred vision she was with a family member who did not notice anything unusual in her behavior.  She did fall onto grass.  She comes in today complaining of right shoulder pain and cervical spine pain.  No paresthesias no loss of strength.  Points to pain in her right shoulder and in the paracervical muscles  Assessment & Plan: Visit Diagnoses:  1. Acute pain of right shoulder   2. Neck pain     Plan: Patient has a history of degeneration of the cervical spine.  I do not find any radicular findings or weakness today.  X-rays were very similar to those taken in 2021.  Her right shoulder not sign any acute changes any lytic changes.  She is fairly soon after her fall.  I like to reevaluate her with Dr. Durward Fortes in 1 week.  In the meantime she is asking for a mild muscle relaxant and I am fine with that.  She also complains of headache states that if these progress or any findings such as weakness blurred vision altered mental status she is to go to the nearest emergency room immediately  Follow-Up Instructions: No follow-ups on file.   Ortho Exam  Patient is alert, oriented, no adenopathy, well-dressed, normal affect, normal respiratory effort. Right shoulder.  She does have some tenderness  with forward elevation.  This is over the [facility] joint.  She has positive empty can testing.  She has difficulty internally rotated behind her back.  Most of her pain is in the [facility] area with impingement.  No tenderness over the scapula.  With regards to her cervical spine she has no area of tenderness or step-off.  She is more tender in the musculature.  She has excellent grip strength no radicular findings  Imaging: XR Cervical Spine 2 or 3 views  Result Date: 07/30/2021 2 views of her cervical spine demonstrate degenerative changes at C4-5 C5-6 with advanced facet arthropathy and spurring and calcification of the ligaments.  No listhesis no acute fractures there is some loss of the normal cervical curve.  Very similar x-rays that were taken in 2021  XR Shoulder Right  Result Date: 07/30/2021 X-rays of her right shoulder demonstrate that the humerus is reduced in the glenoid fossa.  There are some osteophyte and subchondral cyst formation.  There are some degenerative osteophytes on the humeral head.  No acute injuries no noted fractures.  She does have advanced AC arthritis with inferior and superior osteophytes and narrowing of joint space.  On her chest x-ray no pneumothorax she is status post  left upper lobectomy for stage Ia squamous cell carcinoma she had a recent CT scan on this and reviewed it with her oncologist  No images are attached to the encounter.  Labs: Lab Results  Component Value Date   HGBA1C 6.1 (H) 04/19/2017   HGBA1C 6.1 (H) 12/31/2016   HGBA1C 6.7 (H) 06/08/2011   REPTSTATUS 12/11/2018 FINAL 12/08/2018   CULT  12/08/2018    Multiple bacterial morphotypes present, none predominant. Suggest appropriate recollection if clinically indicated.   LABORGA ESCHERICHIA COLI 09/26/2008     Lab Results  Component Value Date   ALBUMIN 3.7 07/08/2021   ALBUMIN 3.9 07/08/2020   ALBUMIN 3.9 07/09/2019    Lab Results  Component Value Date   MG 1.8 11/30/2018   No results found  for: VD25OH  No results found for: PREALBUMIN CBC EXTENDED Latest Ref Rng & Units 07/08/2021 08/18/2020 07/08/2020  WBC 4.0 - 10.5 K/uL 5.6 6.4 5.3  RBC 3.87 - 5.11 MIL/uL 4.15 4.27 4.59  HGB 12.0 - 15.0 g/dL 11.0(L) 11.1(L) 11.9(L)  HCT 36.0 - 46.0 % 34.5(L) 36.3 38.1  PLT 150 - 400 K/uL 275 292 295  NEUTROABS 1.7 - 7.7 K/uL 2.9 - 3.0  LYMPHSABS 0.7 - 4.0 K/uL 2.1 - 1.8     There is no height or weight on file to calculate BMI.  Orders:  Orders Placed This Encounter  Procedures   XR Cervical Spine 2 or 3 views   XR Shoulder Right   No orders of the defined types were placed in this encounter.    Procedures: No procedures performed  Clinical Data: No additional findings.  ROS:  All other systems negative, except as noted in the HPI. Review of Systems  Objective: Vital Signs: There were no vitals taken for this visit.  Specialty Comments:  No specialty comments available.  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   Overweight 12/07/2019   Smoker 12/07/2019   Vitamin D deficiency 12/07/2019   Hypertensive emergency 12/02/2018   Subarachnoid hemorrhage ([place]) 11/29/2018   S/P lobectomy of lung 04/21/2017   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 ([place]) 02/16/2017   Solitary pulmonary nodule 02/08/2017   Shortness of breath 12/29/2016    Class: Acute   Gastroesophageal reflux disease 03/04/2015   Peripheral vascular disease, unspecified ([place]) 07/12/2013   Atherosclerosis of native arteries of the extremities with intermittent claudication 07/12/2013   Chest pain, cardiac 06/08/2011    Class: Acute   Diabetes mellitus ([place]) 06/08/2011    Class: History of   HTN (hypertension), benign 06/08/2011    Class: Chronic   Obesity (BMI 30-39.9) 06/08/2011    Class: History of    Coronary artery disease 06/08/2011    Class: History of   Past Medical History:  Diagnosis Date   Anginal pain ([place])    Arthritis    Asthma    Coronary artery disease    Diabetes mellitus    Dyspnea    Early cataracts, bilateral    GERD (gastroesophageal reflux disease)    occ   Hypertension    Lung cancer ([place])    Mass of upper lobe of left lung    mediastinal adenopathy   Palpitations    Pneumonia    Stroke [facility])    mini stroke   Wears dentures    Wears  glasses     Family History  Problem Relation Age of Onset   Diabetes Mother    Diabetes Sister    Hyperlipidemia Sister    Diabetes Brother    Diabetes Son    Cancer Neg Hx     Past Surgical History:  Procedure Laterality Date   ABDOMINAL HYSTERECTOMY     ANKLE SURGERY Left    fusion   COLONOSCOPY W/ BIOPSIES AND POLYPECTOMY     IR ANGIO EXTRACRAN SEL COM CAROTID INNOMINATE UNI BILAT MOD SED  11/30/2018   IR ANGIO VERTEBRAL SEL VERTEBRAL BILAT MOD SED  11/30/2018   IR ANGIOGRAM SELECTIVE EACH ADDITIONAL VESSEL  11/30/2018   MULTIPLE TOOTH EXTRACTIONS     TOTAL KNEE ARTHROPLASTY Right    VIDEO ASSISTED THORACOSCOPY (VATS)/ LOBECTOMY Left 04/21/2017   Procedure: VIDEO ASSISTED THORACOSCOPY (VATS)/LEFT UPPER LOBECTOMY;  Surgeon: Melrose Nakayama, MD;  Location: [facility] [place] [facility];  Service: Thoracic;  Laterality: Left;   VIDEO BRONCHOSCOPY WITH ENDOBRONCHIAL NAVIGATION N/A 01/21/2017   Procedure: VIDEO BRONCHOSCOPY WITH ENDOBRONCHIAL NAVIGATION;  Surgeon: Melrose Nakayama, MD;  Location: [facility];  Service: Thoracic;  Laterality: N/A;   VIDEO BRONCHOSCOPY WITH ENDOBRONCHIAL ULTRASOUND N/A 01/21/2017   Procedure: VIDEO BRONCHOSCOPY WITH ENDOBRONCHIAL ULTRASOUND;  Surgeon: Melrose Nakayama, MD;  Location: [facility] OR;  Service: Thoracic;  Laterality: N/A;   Social History   Occupational History   Not on file  Tobacco Use   Smoking status: Former    Packs/day: 0.25    Years: 12.00    Pack years: 3.00    Types: Cigarettes     Quit date: 03/2017    Years since quitting: 4.4   Smokeless tobacco: Never  Vaping Use   Vaping Use: Never used  Substance and Sexual Activity   Alcohol use: No   Drug use: No   Sexual activity: Not Currently

## 2021-08-05 ENCOUNTER — Encounter: Payer: Self-pay | Admitting: Orthopaedic Surgery

## 2021-08-05 ENCOUNTER — Other Ambulatory Visit: Payer: Self-pay

## 2021-08-05 ENCOUNTER — Ambulatory Visit: Payer: Medicare HMO | Admitting: Orthopaedic Surgery

## 2021-08-05 DIAGNOSIS — M25511 Pain in right shoulder: Secondary | ICD-10-CM | POA: Diagnosis not present

## 2021-08-05 DIAGNOSIS — G44319 Acute post-traumatic headache, not intractable: Secondary | ICD-10-CM | POA: Diagnosis not present

## 2021-08-05 DIAGNOSIS — M542 Cervicalgia: Secondary | ICD-10-CM

## 2021-08-05 MED ORDER — METHYLPREDNISOLONE ACETATE 40 MG/ML IJ SUSP
40.0000 mg | INTRAMUSCULAR | Status: AC | PRN
  Administered 2021-08-05: 40 mg via INTRA_ARTICULAR

## 2021-08-05 MED ORDER — LIDOCAINE HCL 1 % IJ SOLN
2.0000 mL | INTRAMUSCULAR | Status: AC | PRN
  Administered 2021-08-05: 2 mL

## 2021-08-05 NOTE — Progress Notes (Signed)
Office Visit Note   Patient: [name] [name]           Date of Birth: [birth year]           MRN: [id number] Visit Date: 08/05/2021              Requested by: Willey Blade, [address] [place],   [id number] PCP: Willey Blade, MD   Assessment & Plan: Visit Diagnoses: No diagnosis found.  Plan: Ms. Leonides Schanz presents in follow-up today.  She is a little over a week status post fall onto her her right shoulder onto a grassy surface.  This was witnessed by her family she did not have any loss of consciousness..  She does say that she has had some headaches since her fall.  She has a history of headaches that were evaluated by neurosurgery in 2020.  She also has been seen for right shoulder pain before but does have some stiffness in her shoulder her strength is good but has some impingement findings.  She has about 50% loss of range of motion in her cervical spine x-rays do not show any acute osseous injuries.  X-rays of her shoulder taken last week were also reassuring.  We are going to arrange for her to have physical therapy for her neck.  She will also follow-up with neurology will make an arrangement for this for her headaches that have returned in the last week.  She does not have any acute symptoms such as blurring of vision memory issues and her headaches are not severe.  She will follow-up with [place] in 3 weeks.  We will give her a shoulder injection today she has had trouble in the past and has been seen by Dr. Junius Roads for both her neck and her right shoulder  Follow-Up Instructions: No follow-ups on file.   Orders:  No orders of the defined types were placed in this encounter.  No orders of the defined types were placed in this encounter.     Procedures: Large Joint Inj: R subacromial bursa on 08/05/2021 1:27 PM Indications: diagnostic evaluation and pain Details: 25 G 1.5 in needle, anterior approach  Arthrogram: No  Medications: 2 mL lidocaine 1 %; 40  mg methylPREDNISolone acetate 40 MG/ML Outcome: tolerated well, no immediate complications Procedure, treatment alternatives, risks and benefits explained, specific risks discussed. Consent was given by the patient.     Clinical Data: No additional findings.   Subjective: Chief Complaint  Patient presents with   Right Shoulder - Follow-up   Neck - Follow-up  Patient presents today for a one week follow up on her right shoulder and neck pain. She said that she is "still hurting". She does report some improvement. She is taking her muscle relaxer.    Review of Systems  All other systems reviewed and are negative.   Objective: Vital Signs: There were no vitals taken for this visit.  Physical Exam Constitutional:      Appearance: Normal appearance.  Eyes:     Extraocular Movements: Extraocular movements intact.  Skin:    General: Skin is warm and dry.  Neurological:     Mental Status: She is alert.  Psychiatric:        Mood and Affect: Mood normal.        Behavior: Behavior normal.    Ortho Exam Examination of her neck: She has 50% loss of range of motion of the neck especially with extension turning to the right.  She  has no paresthesias she has equal good grip strength.  Nontender to palpation over the cervical spine.  Some localized tenderness into the musculature on the right. Right shoulder she does have forward elevation but is painful especially at the end of extension.  Equal to her unaffected side however more painful to get to this area.  She has good strength with resisted abduction and resisted abduction she does have some impingement findings.  Sensation is normal Specialty Comments:  No specialty comments available.  Imaging: No results found.   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   Hyperlipidemia 12/07/2019   Hypertensive heart and renal disease  12/07/2019   Overweight 12/07/2019   Smoker 12/07/2019   Vitamin D deficiency 12/07/2019   Hypertensive emergency 12/02/2018   Subarachnoid hemorrhage ([place]) 11/29/2018   S/P lobectomy of lung 04/21/2017   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 ([place]) 02/16/2017   Solitary pulmonary nodule 02/08/2017   Shortness of breath 12/29/2016    Class: Acute   Gastroesophageal reflux disease 03/04/2015   Peripheral vascular disease, unspecified ([place]) 07/12/2013   Atherosclerosis of native arteries of the extremities with intermittent claudication 07/12/2013   Chest pain, cardiac 06/08/2011    Class: Acute   Diabetes mellitus ([place]) 06/08/2011    Class: History of   HTN (hypertension), benign 06/08/2011    Class: Chronic   Obesity (BMI 30-39.9) 06/08/2011    Class: History of   Coronary artery disease 06/08/2011    Class: History of   Past Medical History:  Diagnosis Date   Anginal pain ([place])    Arthritis    Asthma    Coronary artery disease    Diabetes mellitus    Dyspnea    Early cataracts, bilateral    GERD (gastroesophageal reflux disease)    occ   Hypertension    Lung cancer ([place])    Mass of upper lobe of left lung    mediastinal adenopathy   Palpitations    Pneumonia    Stroke ([place])    mini stroke   Wears dentures    Wears glasses     Family History  Problem Relation Age of Onset   Diabetes Mother    Diabetes Sister    Hyperlipidemia Sister    Diabetes Brother    Diabetes Son    Cancer Neg Hx     Past Surgical History:  Procedure Laterality Date   ABDOMINAL HYSTERECTOMY     ANKLE SURGERY Left    fusion   COLONOSCOPY W/ BIOPSIES AND POLYPECTOMY     IR ANGIO EXTRACRAN SEL COM CAROTID INNOMINATE UNI BILAT MOD SED  11/30/2018   IR ANGIO VERTEBRAL SEL VERTEBRAL BILAT MOD SED  11/30/2018   IR ANGIOGRAM SELECTIVE EACH ADDITIONAL VESSEL  11/30/2018   MULTIPLE TOOTH EXTRACTIONS     TOTAL KNEE ARTHROPLASTY Right    VIDEO ASSISTED THORACOSCOPY (VATS)/  LOBECTOMY Left 04/21/2017   Procedure: VIDEO ASSISTED THORACOSCOPY (VATS)/LEFT UPPER LOBECTOMY;  Surgeon: Melrose Nakayama, MD;  Location: [place];  Service: Thoracic;  Laterality: Left;   VIDEO BRONCHOSCOPY WITH ENDOBRONCHIAL NAVIGATION N/A 01/21/2017   Procedure: VIDEO BRONCHOSCOPY WITH ENDOBRONCHIAL NAVIGATION;  Surgeon: Melrose Nakayama, MD;  Location: [place];  Service: Thoracic;  Laterality: N/A;   VIDEO BRONCHOSCOPY WITH ENDOBRONCHIAL ULTRASOUND N/A 01/21/2017   Procedure: VIDEO BRONCHOSCOPY WITH ENDOBRONCHIAL ULTRASOUND;  Surgeon: Melrose Nakayama, MD;  Location: [facility];  Service: Thoracic;  Laterality: N/A;   Social History   Occupational History   Not on file  Tobacco Use   Smoking status: Former    Packs/day: 0.25    Years: 12.00    Pack years: 3.00    Types: Cigarettes    Quit date: 03/2017    Years since quitting: 4.4   Smokeless tobacco: Never  Vaping Use   Vaping Use: Never used  Substance and Sexual Activity   Alcohol use: No   Drug use: No   Sexual activity: Not Currently

## 2021-08-06 ENCOUNTER — Encounter: Payer: Self-pay | Admitting: Neurology

## 2021-08-12 ENCOUNTER — Ambulatory Visit: Payer: Medicare HMO | Admitting: Rehabilitative and Restorative Service Providers"

## 2021-08-17 ENCOUNTER — Encounter: Payer: Self-pay | Admitting: Physical Therapy

## 2021-08-17 ENCOUNTER — Ambulatory Visit: Payer: Medicare PPO | Admitting: Physical Therapy

## 2021-08-17 ENCOUNTER — Other Ambulatory Visit: Payer: Self-pay

## 2021-08-17 DIAGNOSIS — R293 Abnormal posture: Secondary | ICD-10-CM | POA: Diagnosis not present

## 2021-08-17 DIAGNOSIS — G8929 Other chronic pain: Secondary | ICD-10-CM

## 2021-08-17 DIAGNOSIS — M542 Cervicalgia: Secondary | ICD-10-CM | POA: Diagnosis not present

## 2021-08-17 DIAGNOSIS — M6281 Muscle weakness (generalized): Secondary | ICD-10-CM

## 2021-08-17 DIAGNOSIS — M25511 Pain in right shoulder: Secondary | ICD-10-CM

## 2021-08-17 DIAGNOSIS — R2689 Other abnormalities of gait and mobility: Secondary | ICD-10-CM

## 2021-08-17 NOTE — Therapy (Addendum)
[facility] [address] [place], [place], [address] Phone: [phone]   Fax:  [phone]  Physical Therapy Evaluation Referring diagnosis? M54.2 Treatment diagnosis? (if different than referring diagnosis) same as above What was this (referring dx) caused by? []  Surgery [x]  Fall []  Ongoing issue [x]  Arthritis []  Other: ____________  Laterality: [x]  Rt []  Lt []  Both  Check all possible CPT codes:  *CHOOSE 10 OR LESS*    [x]  97110 (Therapeutic Exercise)  []  92507 (SLP Treatment)  [x]  97112 (Neuro Re-ed)   []  92526 (Swallowing Treatment)   [x]  97116 (Gait Training)   []  [id number] (Cognitive Training, 1st 15 minutes) [x]  97140 (Manual Therapy)   []  21224 (Cognitive Training, each add'l 15 minutes)  [x]  97530 (Therapeutic Activities)  []  Other, List CPT Code ____________    []  97535 (Self Care)       []  All codes above (97110 - 97535)  [x]  97012 (Mechanical Traction)  [x]  97014 (E-stim Unattended)  []  97032 (E-stim manual)  []  97033 (Ionto)  [x]  97035 (Ultrasound)  []  97760 (Orthotic Fit) []  97750 (Physical Performance Training) []  [id number] (Aquatic Therapy) []  97034 (Contrast Bath) []  97018 (Paraffin) []  97597 (Wound Care 1st 20 sq cm) []  97598 (Wound Care each add'l 20 sq cm) [x]  97016 (Vasopneumatic Device) []  [id number] (Orthotic Training) []  [phone] (Prosthetic Training)   Patient Details  Name: [name] [name] MRN: [id number] Date of Birth: 1935-06-01 Referring Provider (PT): Dr. Durward Fortes   Encounter Date: 08/17/2021   PT End of Session - 08/17/21 1042     Visit Number 1    Number of Visits 12    Date for PT Re-Evaluation 09/28/21    Authorization Type [employer] MCR    Authorization - Visit Number 1    Authorization - Number of Visits --   authorization submitted 1/16   Progress Note Due on Visit 10    PT Start Time 0932    PT Stop Time 1015    PT Time Calculation (min) 43 min    Activity Tolerance Patient tolerated treatment  well;Patient limited by pain    Behavior During Therapy WFL for tasks assessed/performed             Past Medical History:  Diagnosis Date   Anginal pain ([place])    Arthritis    Asthma    Coronary artery disease    Diabetes mellitus    Dyspnea    Early cataracts, bilateral    GERD (gastroesophageal reflux disease)    occ   Hypertension    Lung cancer ([place])    Mass of upper lobe of left lung    mediastinal adenopathy   Palpitations    Pneumonia    Stroke ([place])    mini stroke   Wears dentures    Wears glasses     Past Surgical History:  Procedure Laterality Date   ABDOMINAL HYSTERECTOMY     ANKLE SURGERY Left    fusion   COLONOSCOPY W/ BIOPSIES AND POLYPECTOMY     IR ANGIO EXTRACRAN SEL COM CAROTID INNOMINATE UNI BILAT MOD SED  11/30/2018   IR ANGIO VERTEBRAL SEL VERTEBRAL BILAT MOD SED  11/30/2018   IR ANGIOGRAM SELECTIVE EACH ADDITIONAL VESSEL  11/30/2018   MULTIPLE TOOTH EXTRACTIONS     TOTAL KNEE ARTHROPLASTY Right    VIDEO ASSISTED THORACOSCOPY (VATS)/ LOBECTOMY Left 04/21/2017   Procedure: VIDEO ASSISTED THORACOSCOPY (VATS)/LEFT UPPER LOBECTOMY;  Surgeon: Melrose Nakayama, MD;  Location: [facility];  Service: Thoracic;  Laterality: Left;   VIDEO BRONCHOSCOPY WITH ENDOBRONCHIAL NAVIGATION N/A 01/21/2017   Procedure: VIDEO BRONCHOSCOPY WITH ENDOBRONCHIAL NAVIGATION;  Surgeon: Melrose Nakayama, MD;  Location: [place];  Service: Thoracic;  Laterality: N/A;   VIDEO BRONCHOSCOPY WITH ENDOBRONCHIAL ULTRASOUND N/A 01/21/2017   Procedure: VIDEO BRONCHOSCOPY WITH ENDOBRONCHIAL ULTRASOUND;  Surgeon: Melrose Nakayama, MD;  [facility];  Service: Thoracic;  Laterality: N/A;    There were no vitals filed for this visit.    Subjective Assessment - 08/17/21 0939     Subjective She has had chronic neck and Rt shoulder pain and has had PT here in the past with good results. Then on 07/26/21 she fell in the grass and landed on her Rt shoulder and aggravated her neck and  Rt shoulder. She had injection on 08/05/21 which helped some.    Diagnostic tests Imaging: Rt shoulder XR negative for acute findings but OA noted. Neck XR no acute fractures but advanced DDD and facet arthropathy, loss of normal cervical curve.    Patient Stated Goals reduce pain    Currently in Pain? Yes    Pain Score 8     Pain Location Neck   and right shoulder   Pain Orientation Right    Pain Descriptors / Indicators Aching    Pain Type Chronic pain    Pain Radiating Towards she denies N/T    Pain Onset More than a month ago    Pain Frequency Intermittent    Aggravating Factors  raising her Rt arm, neck sidebending to Rt    Pain Relieving Factors tyelenol, arthritis gel                [facility] PT Assessment - 08/17/21 0001       Assessment   Medical Diagnosis Neck pain, recent fall onto Rt shoulder    Referring Provider (PT) Dr. Durward Fortes    Onset Date/Surgical Date --   pain from fall on 07/26/22   Hand Dominance Right    Next MD Visit 08/27/21      Precautions   Precautions Fall      Restrictions   Weight Bearing Restrictions No      Balance Screen   Has the patient fallen in the past 6 months Yes    How many times? 1   lost balance posteriorly and not sure why   Has the patient had a decrease in activity level because of a fear of falling?  Yes    Is the patient reluctant to leave their home because of a fear of falling?  No      Home Ecologist residence      Prior Function   Level of Independence Independent with basic ADLs    Vocation Retired    Leisure nothing to report      Cognition   Overall Cognitive Status Within Functional Limits for tasks assessed      Observation/Other Assessments   Focus on Therapeutic Outcomes (FOTO)  38% functional, goal 60%      Posture/Postural Control   Posture Comments fwd head posture, thoracic kyphosis      ROM / Strength   AROM / PROM / Strength AROM;PROM;Strength      AROM   AROM  Assessment Site Shoulder;Cervical    Right/Left Shoulder Right    Right Shoulder Flexion 90 Degrees    Right Shoulder ABduction 73 Degrees    Cervical Flexion 20    Cervical Extension 5  Cervical - Right Side Bend 10    Cervical - Left Side Bend 10    Cervical - Right Rotation 15    Cervical - Left Rotation 20      PROM   PROM Assessment Site Shoulder    Right/Left Shoulder Right    Right Shoulder Flexion 130 Degrees    Right Shoulder ABduction 110 Degrees    Right Shoulder Internal Rotation 55 Degrees    Right Shoulder External Rotation 60 Degrees      Strength   Strength Assessment Site Shoulder    Right/Left Shoulder Right    Right Shoulder Flexion 3+/5    Right Shoulder ABduction 3-/5    Right Shoulder Internal Rotation 4+/5    Right Shoulder External Rotation 3+/5      Palpation   Palpation comment fair GH mobility in Rt      Special Tests   Other special tests negative drop arm test for significant RTC tear      Standardized Balance Assessment   Standardized Balance Assessment Berg Balance Test      Berg Balance Test   Standing Unsupported Able to stand safely 2 minutes    Standing Unsupported with Eyes Closed Able to stand 10 seconds with supervision    Standing Unsupported, One Foot in Front Needs help to step but can hold 15 seconds                        Objective measurements completed on examination: See above findings.       [facility] PT Treatment/Exercise - 08/17/21 0001       Modalities   Modalities Moist Heat      Moist Heat Therapy   Number Minutes Moist Heat 10 Minutes   Rt side with HEP creation and review   Moist Heat Location Shoulder;Cervical                     PT Education - 08/17/21 1042     Education Details HEP,POC    Person(s) Educated Patient    Methods Explanation;Demonstration;Verbal cues;Handout    Comprehension Verbalized understanding;Returned demonstration;Need further instruction               PT Short Term Goals - 08/17/21 1057       PT SHORT TERM GOAL #1   Title Patient will demonstrate independent use of home exercise program to maintain progress from in clinic treatments.    Baseline -    Time 4    Period Weeks    Status New    Target Date 09/14/21      PT SHORT TERM GOAL #2   Title Pt will reduce pain 25% overall    Baseline 8    Time 4    Period Weeks    Status New               PT Long Term Goals - 08/17/21 1058       PT LONG TERM GOAL #1   Title Pt wil reduce pain overall 50% to less than 4/10 with usual activity    Time 6    Period Weeks    Status New    Target Date 09/28/21      PT LONG TERM GOAL #2   Title Pt will improve FOTO to 60% functional    Time 6    Period Weeks    Status New    Target Date 09/28/21  PT LONG TERM GOAL #3   Title Patient will demonstrate cervical AROM and Rt shoulder AROM WFL to facilitate daily activity including driving, self care at PLOF s limitation due to symptoms.    Baseline severe limitations due to pain and stiffness    Time 6    Period Weeks    Status New    Target Date 09/28/21      PT LONG TERM GOAL #4   Title Pt will improve Rt shoulder strength to 4/5 MMT grossly for functional strength    Baseline 3/5 for abd and ER    Time 6    Period Weeks    Status New      PT LONG TERM GOAL #5   Title Pt will finish BERG balance test and will update goal based on her inital score    Time 6    Period Weeks    Status New    Target Date 09/28/21                    Plan - 08/17/21 1048     Clinical Impression Statement Pt presents with recent exacerbation of chronic Rt sided neck and shoulder pain after recent fall 07/26/21. XR are negative for acute fractures but she does have OA, pain, stiffness, and weakness and would benefit from skilled PT to address these funcitonal deficits. Her Rt shoulder has significant weakness in RTC so can not rule out partial tear, it is reassuring  that drop arm test was negative for complete tear. She has home TENS unit so I encouraged her to try it out to see if it helps reduce some of her pain. She would also benefit from further balance testing when time allows and balance training to reduce her risk of falling.    Personal Factors and Comorbidities Comorbidity 3+    Comorbidities HTN, DM, history of cancer (lung), CVA, GERD    Examination-Activity Limitations Bed Mobility;Bend;Carry;Lift;Stand;Stairs;Squat;Sleep;Reach Overhead;Locomotion Level;Transfers    Examination-Participation Restrictions Cleaning;Community Activity;Driving;Yard Work;Laundry;Shop    Stability/Clinical Decision Making Evolving/Moderate complexity    Clinical Decision Making Moderate    Rehab Potential Good    PT Frequency 2x / week   1-2   PT Duration 6 weeks    PT Treatment/Interventions ADLs/Self Care Home Management;Cryotherapy;Electrical Stimulation;Moist Heat;Traction;Ultrasound;Therapeutic activities;Therapeutic exercise;Neuromuscular re-education;Manual techniques;Passive range of motion;Dry needling;Joint Manipulations;Vasopneumatic Device;Taping    PT Next Visit Plan finish BERG balance test, take her through HEP    PT Home Exercise Plan Access Code: [email or web address]    [email or web address] and Agree with Plan of Care Patient             Patient will benefit from skilled therapeutic intervention in order to improve the following deficits and impairments:  Decreased activity tolerance, Decreased balance, Decreased endurance, Decreased mobility, Decreased range of motion, Decreased strength, Difficulty walking, Postural dysfunction, Impaired flexibility, Pain  Visit Diagnosis: Cervicalgia  Chronic right shoulder pain  Muscle weakness (generalized)  Abnormal posture  Other abnormalities of gait and mobility     Problem List Patient Active Problem List   Diagnosis Date Noted   Allergic rhinitis 12/07/2019   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    Hyperlipidemia 12/07/2019   Hypertensive heart and renal disease 12/07/2019   Overweight 12/07/2019   Smoker 12/07/2019   Vitamin D deficiency 12/07/2019   Hypertensive emergency 12/02/2018   Subarachnoid hemorrhage ([place]) 11/29/2018   S/P lobectomy of lung 04/21/2017   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 ([place]) 02/16/2017  Solitary pulmonary nodule 02/08/2017   Shortness of breath 12/29/2016    Class: Acute   Gastroesophageal reflux disease 03/04/2015   Peripheral vascular disease, unspecified ([place]) 07/12/2013   Atherosclerosis of native arteries of the extremities with intermittent claudication 07/12/2013   Chest pain, cardiac 06/08/2011    Class: Acute   Diabetes mellitus ([place]) 06/08/2011    Class: History of   HTN (hypertension), benign 06/08/2011    Class: Chronic   Obesity (BMI 30-39.9) 06/08/2011    Class: History of   Coronary artery disease 06/08/2011    Class: History of    Debbe Odea, PT,DPT 08/17/2021, 11:07 [facility] [address] [place], [place], [address] Phone: [phone]   Fax:  [phone]  Name: [name] [name] MRN: [id number] Date of Birth: 05-06-1935

## 2021-08-17 NOTE — Patient Instructions (Signed)
Access Code: [email or web address] URL: [email or web address] Date: 08/17/2021 Prepared by: Elsie Ra  Exercises Seated Upper Trapezius Stretch - 2 x daily - 6 x weekly - 1 sets - 10 reps - 5 sec hold cervical rotation stretch - 2 x daily - 6 x weekly - 1 sets - 10 reps - 5 sec hold Seated Scapular Retraction - 2 x daily - 7 x weekly - 2 sets - 10 reps - 5 hold Seated Cervical Retraction - 2 x daily - 7 x weekly - 2 sets - 10 reps Supine Shoulder Flexion AAROM with Dowel - 2 x daily - 6 x weekly - 1 sets - 10-15 reps Seated Shoulder Abduction AAROM with Dowel - 2 x daily - 6 x weekly - 3 sets - 10 reps Shoulder External Rotation and Scapular Retraction with Resistance - 2 x daily - 6 x weekly - 1-2 sets - 10 reps

## 2021-08-17 NOTE — Progress Notes (Signed)
NEUROLOGY CONSULTATION NOTE  [name] [name] MRN: [id number] DOB: [birth year]  Referring provider: Joni Fears, MD Primary care provider: Willey Blade, MD  Reason for consult:  headaches  Assessment/Plan:   Post traumatic headache complicated by cervicalgia and rotator cuff impingement and possibly medication overuse.  Given history of spontaneous subarachnoid hemorrhage presenting with similar symptoms, will check CT head out of caution.  As headaches clearly started after the fall, giant cell arteritis not suspected. Cervicalgia History of spontaneous subarachnoid hemorrhage Hypertension   Check CT head Continue physical therapy Start gabapentin 114m at bedtime.  We can increase to 2060mat bedtime in 4 weeks if needed Limit use of pain relievers to no more than 2 days out of week to prevent risk of rebound or medication-overuse headache. Follow up with PCP regarding blood pressure Follow up 4 months.    Subjective:  [name] PAOLAs an [age]ear old right-handed female with HTN, DM II, asthma, CAD, arthritis, lung cancer and history of stroke who presents for headache.  History supplemented by referring provider's note.  On Christmas Day, she was on the grass and fell backwards striking the back of her head and landing on her right shoulder.  No nausea, vomiting, confusion or loss of consciousness.  Since then, she has had had headaches.  They are moderate aching from the front radiating to the right temple and down the right side of her neck into the shoulder. It is constant and improves if she takes Tylenol.  Takes it everyday. No nausea, visual disturbance, confusion, unilateral numbness or weakness.  She followed up with orthopedics.  Cervical spine X-rays on 07/30/2021 personally reviewed showed degenerative changes with advanced facet arthropathy and spurring at C4-5 and C5-6 but no acute osseous fractures.  X-ray of right shoulder revealed arthritic changes but no  acute abnormalities.  She received a shoulder injection and was ordered physical therapy for her neck and shoulder.  First session was yesterday and scheduled for once a week.   She has a prior history of spontaneous subarachnoid hemorrhage back in 2020 presenting as headache and neck pain.  CT head on 11/29/2018 personally reviewed revealed subarachnoid hemorrhage at the craniocervical junction on the left.  Cerebral angiogram was negative for aneurysm or other secondary etiology.     Current NSAIDs/analgesics:  ASA 8131maily, diclofenac 14m32mrrent muscle relaxer:  methocarbamol   PAST MEDICAL HISTORY: Past Medical History:  Diagnosis Date   Anginal pain ([facility])[place] Arthritis    Asthma    Coronary artery disease    Diabetes mellitus    Dyspnea    Early cataracts, bilateral    GERD (gastroesophageal reflux disease)    occ   Hypertension    Lung cancer ([facility])    Mass of upper lobe of left lung    mediastinal adenopathy   Palpitations    Pneumonia    Stroke ([facility])[place] mini stroke   Wears dentures    Wears glasses     PAST SURGICAL HISTORY: Past Surgical History:  Procedure Laterality Date   ABDOMINAL HYSTERECTOMY     ANKLE SURGERY Left    fusion   COLONOSCOPY W/ BIOPSIES AND POLYPECTOMY     IR ANGIO EXTRACRAN SEL COM CAROTID INNOMINATE UNI BILAT MOD SED  11/30/2018   IR ANGIO VERTEBRAL SEL VERTEBRAL BILAT MOD SED  11/30/2018   IR ANGIOGRAM SELECTIVE EACH ADDITIONAL VESSEL  11/30/2018   MULTIPLE TOOTH EXTRACTIONS     TOTAL KNEE  ARTHROPLASTY Right    VIDEO ASSISTED THORACOSCOPY (VATS)/ LOBECTOMY Left 04/21/2017   Procedure: VIDEO ASSISTED THORACOSCOPY (VATS)/LEFT UPPER LOBECTOMY;  Surgeon: Melrose Nakayama, MD;  Location: [facility];  Service: Thoracic;  Laterality: Left;   VIDEO BRONCHOSCOPY WITH ENDOBRONCHIAL NAVIGATION N/A 01/21/2017   Procedure: VIDEO BRONCHOSCOPY WITH ENDOBRONCHIAL NAVIGATION;  Surgeon: Melrose Nakayama, MD;  Location: [facility];  Service: Thoracic;   Laterality: N/A;   VIDEO BRONCHOSCOPY WITH ENDOBRONCHIAL ULTRASOUND N/A 01/21/2017   Procedure: VIDEO BRONCHOSCOPY WITH ENDOBRONCHIAL ULTRASOUND;  Surgeon: Melrose Nakayama, MD;  [facility];  Service: Thoracic;  Laterality: N/A;    MEDICATIONS: Current Outpatient Medications on File Prior to Visit  Medication Sig Dispense Refill   albuterol (VENTOLIN HFA) 108 (90 Base) MCG/ACT inhaler albuterol sulfate HFA 90 mcg/actuation aerosol inhaler     amLODipine (NORVASC) 10 MG tablet Take 10 mg by mouth daily.       AQUALANCE LANCETS 30G MISC      aspirin EC 81 MG tablet Take 81 mg by mouth daily.       atorvastatin (LIPITOR) 20 MG tablet Take 20 mg by mouth daily at 6 PM.      Blood Glucose Calibration (OT ULTRA/FASTTK CNTRL SOLN) SOLN      Blood Glucose Monitoring Suppl (ONE TOUCH ULTRA 2) w/Device KIT      Cholecalciferol (VITAMIN D3) 125 MCG (5000 UT) TABS Take 5,000 Units by mouth daily.      diclofenac (VOLTAREN) 25 MG EC tablet Take 1 tablet (25 mg total) by mouth 2 (two) times daily as needed. 60 tablet 3   diclofenac Sodium (VOLTAREN) 1 % GEL Apply 4 g topically 4 (four) times daily as needed. 500 g 6   ergocalciferol (VITAMIN D2) 1.25 MG (50000 UT) capsule ergocalciferol (vitamin D2) 1,250 mcg (50,000 unit) capsule     hydrALAZINE (APRESOLINE) 25 MG tablet Take 25 mg by mouth 2 (two) times daily.      isosorbide dinitrate (ISORDIL) 20 MG tablet Take 20 mg by mouth 2 (two) times daily.      Lancet Devices (ADJUSTABLE LANCING DEVICE) MISC      meclizine (ANTIVERT) 25 MG tablet Take 25 mg by mouth 2 (two) times daily as needed for dizziness.     metFORMIN (GLUCOPHAGE) 500 MG tablet Take 500 mg by mouth 2 (two) times daily.      methocarbamol (ROBAXIN) 500 MG tablet Take 1 tablet (500 mg total) by mouth every 8 (eight) hours as needed for muscle spasms. 30 tablet 0   methylPREDNISolone (MEDROL DOSEPAK) 4 MG TBPK tablet As directed for 6 days. 21 tablet 0   metoprolol succinate  (TOPROL-XL) 25 MG 24 hr tablet Take 25 mg by mouth every evening.      nitroGLYCERIN (NITROSTAT) 0.4 MG SL tablet Place 0.4 mg under the tongue every 5 (five) minutes as needed for chest pain.     omeprazole (PRILOSEC) 20 MG capsule Take 20 mg by mouth daily.     ONE TOUCH ULTRA TEST test strip      pioglitazone (ACTOS) 30 MG tablet Take 30 mg by mouth every evening.      temazepam (RESTORIL) 15 MG capsule Take 15 mg by mouth at bedtime as needed.     valsartan (DIOVAN) 160 MG tablet Take 160 mg by mouth daily.   5   No current facility-administered medications on file prior to visit.    ALLERGIES: Allergies  Allergen Reactions   Penicillins Rash and Other (See Comments)  PATIENT HAS HAD A PCN REACTION WITH IMMEDIATE RASH, FACIAL/TONGUE/THROAT SWELLING, SOB, OR LIGHTHEADEDNESS WITH HYPOTENSION:  #  #  #  YES  #  #  #  Has patient had a PCN reaction causing severe rash involving mucus membranes or skin necrosis: NO Has patient had a PCN reaction that required hospitalization NO Has patient had a PCN reaction occurring within the last 10 years: NO   Tramadol Itching   Codeine Rash    FAMILY HISTORY: Family History  Problem Relation Age of Onset   Diabetes Mother    Diabetes Sister    Hyperlipidemia Sister    Diabetes Brother    Diabetes Son    Cancer Neg Hx     Objective:  Blood pressure (!) 150/72, pulse 85, height _0  (1.753 m), weight 162 lb 3.2 oz (73.6 kg), SpO2 94 %. General: No acute distress.  Patient appears well-groomed.   Head:  Normocephalic/atraumatic Eyes:  fundi examined but not visualized Neck: supple, no paraspinal tenderness, full range of motion Back: No paraspinal tenderness Heart: regular rate and rhythm Lungs: Clear to auscultation bilaterally. Vascular: No carotid bruits. Neurological Exam: Mental status: alert and oriented to person, place, and time, recent and remote memory intact, fund of knowledge intact, attention and concentration intact,  speech fluent and not dysarthric, language intact. Cranial nerves: CN I: not tested CN II: pupils equal, round and reactive to light, visual fields intact CN III, IV, VI:  full range of motion, no nystagmus, no ptosis CN V: facial sensation intact. CN VII: upper and lower face symmetric CN VIII: hearing intact CN IX, X: gag intact, uvula midline CN XI: sternocleidomastoid and trapezius muscles intact CN XII: tongue midline Bulk & Tone: normal, no fasciculations. Motor:  muscle strength 5/5 throughout Sensation:  Pinprick, temperature and vibratory sensation intact. Deep Tendon Reflexes:  2+ throughout,  toes downgoing.   Finger to nose testing:  Without dysmetria.   Heel to shin:  Without dysmetria.   Gait:  cautious broad-based gait.  Requires assistance of cane.  Romberg negative.    Thank you for allowing me to take part in the care of this patient.  Metta Clines, DO  CC:  Willey Blade, MD  Joni Fears, MD

## 2021-08-18 ENCOUNTER — Ambulatory Visit: Payer: Medicare PPO | Admitting: Neurology

## 2021-08-18 ENCOUNTER — Encounter: Payer: Self-pay | Admitting: Neurology

## 2021-08-18 ENCOUNTER — Other Ambulatory Visit: Payer: Self-pay

## 2021-08-18 VITALS — BP 150/72 | HR 85 | Ht 69.0 in | Wt 162.2 lb

## 2021-08-18 DIAGNOSIS — M542 Cervicalgia: Secondary | ICD-10-CM

## 2021-08-18 DIAGNOSIS — I609 Nontraumatic subarachnoid hemorrhage, unspecified: Secondary | ICD-10-CM | POA: Diagnosis not present

## 2021-08-18 DIAGNOSIS — G44309 Post-traumatic headache, unspecified, not intractable: Secondary | ICD-10-CM | POA: Diagnosis not present

## 2021-08-18 DIAGNOSIS — I10 Essential (primary) hypertension: Secondary | ICD-10-CM | POA: Diagnosis not present

## 2021-08-18 MED ORDER — GABAPENTIN 100 MG PO CAPS
100.0000 mg | ORAL_CAPSULE | Freq: Every day | ORAL | 5 refills | Status: DC
Start: 2021-08-18 — End: 2021-12-13

## 2021-08-18 NOTE — Patient Instructions (Addendum)
Continue physical therapy Start gabapentin 100mg  at bedtime.  If no improvement in headaches in 4 weeks, contact me and we can increase dose.  Caution for dizziness. Limit use of pain relievers (such as Tylenol, Advil, Aleve, Excedrin) to no more than 2 days out of week to prevent risk of rebound or medication-overuse headache. Check CT head Follow up 4 months.

## 2021-08-27 ENCOUNTER — Encounter: Payer: Self-pay | Admitting: Orthopaedic Surgery

## 2021-08-27 ENCOUNTER — Other Ambulatory Visit: Payer: Self-pay

## 2021-08-27 ENCOUNTER — Ambulatory Visit: Payer: Medicare PPO | Admitting: Orthopaedic Surgery

## 2021-08-27 DIAGNOSIS — M25511 Pain in right shoulder: Secondary | ICD-10-CM | POA: Diagnosis not present

## 2021-08-27 DIAGNOSIS — M47812 Spondylosis without myelopathy or radiculopathy, cervical region: Secondary | ICD-10-CM | POA: Diagnosis not present

## 2021-08-27 DIAGNOSIS — G8929 Other chronic pain: Secondary | ICD-10-CM | POA: Diagnosis not present

## 2021-08-27 NOTE — Progress Notes (Signed)
Office Visit Note   Patient: [name] [name]           Date of Birth: [birth year]           MRN: [id number] Visit Date: 08/27/2021              Requested by: Willey Blade, [address] [place],  [place] [address] PCP: Willey Blade, MD   Assessment & Plan: Visit Diagnoses:  1. Chronic right shoulder pain   2. Cervical spine arthritis     Plan: Mrs. Priego is about a month status post fall on a grassy surface landing on her right shoulder.  She notes that she has had some problems with headaches since that time and has been seen by the neurologist and a CT scan of the brain has been ordered.  We also evaluate her for her neck and her shoulder.  I injected the shoulder with cortisone and she notes it is definitely better.  She is involved in physical therapy and feels like it is made a difference although she is only been wants.  She also has significant arthritis in the cervical spine and is receiving therapy for that as well.  She has had an issue with her neck and her shoulder in the past.  We may consider further diagnostic studies depending upon her progress or lack of progress over the next month.  Follow-Up Instructions: Return in about 1 month (around 09/27/2021).   Orders:  No orders of the defined types were placed in this encounter.  No orders of the defined types were placed in this encounter.     Procedures: No procedures performed   Clinical Data: No additional findings.   Subjective: Chief Complaint  Patient presents with   Right Shoulder - Follow-up   Neck - Follow-up  Patient presents today for neck and right shoulder follow up. She received a cortisone injection in her shoulder at her last visit 3 weeks ago. She said that she has noticed small improvement with the injection. She is taking Tylenol and a muscle relaxer.   HPI  Review of Systems   Objective: Vital Signs: There were no vitals taken for this visit.  Physical  Exam Constitutional:      Appearance: She is well-developed.  Pulmonary:     Effort: Pulmonary effort is normal.  Skin:    General: Skin is warm and dry.  Neurological:     Mental Status: She is alert and oriented to person, place, and time.  Psychiatric:        Behavior: Behavior normal.    Ortho Exam awake but and oriented x3.  Comfortable sitting.  There is some lack of full overhead motion of the right shoulder consistent with a mild adhesive capsulitis.  I did states she lacks about 30 degrees.  She also had some loss of internal rotation.  Abduction to 90 degrees.  Good strength good grip and release.  There is still some decreased motion of the cervical spine but no evidence of a radiculopathy.  Very minimal discomfort with neck motion  Specialty Comments:  No specialty comments available.  Imaging: No results found.   PMFS History: Patient Active Problem List   Diagnosis Date Noted   Pain in right shoulder 08/27/2021   Cervical spine arthritis 08/27/2021   Allergic rhinitis 12/07/2019   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   Hyperlipidemia 12/07/2019   Hypertensive heart and renal disease 12/07/2019   Overweight 12/07/2019   Smoker 12/07/2019  Vitamin D deficiency 12/07/2019   Hypertensive emergency 12/02/2018   Subarachnoid hemorrhage ([place]) 11/29/2018   S/P lobectomy of lung 04/21/2017   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 ([place]) 02/16/2017   Solitary pulmonary nodule 02/08/2017   Shortness of breath 12/29/2016    Class: Acute   Gastroesophageal reflux disease 03/04/2015   Peripheral vascular disease, unspecified ([place]) 07/12/2013   Atherosclerosis of native arteries of the extremities with intermittent claudication 07/12/2013   Chest pain, cardiac 06/08/2011    Class: Acute   Diabetes mellitus ([place]) 06/08/2011    Class: History of   HTN (hypertension), benign 06/08/2011    Class: Chronic   Obesity (BMI 30-39.9) 06/08/2011    Class:  History of   Coronary artery disease 06/08/2011    Class: History of   Past Medical History:  Diagnosis Date   Anginal pain ([place])    Arthritis    Asthma    Coronary artery disease    Diabetes mellitus    Dyspnea    Early cataracts, bilateral    GERD (gastroesophageal reflux disease)    occ   Hypertension    Lung cancer ([place])    Mass of upper lobe of left lung    mediastinal adenopathy   Palpitations    Pneumonia    Stroke ([place])    mini stroke   Wears dentures    Wears glasses     Family History  Problem Relation Age of Onset   Diabetes Mother    Diabetes Sister    Hyperlipidemia Sister    Diabetes Brother    Diabetes Son    Cancer Neg Hx     Past Surgical History:  Procedure Laterality Date   ABDOMINAL HYSTERECTOMY     ANKLE SURGERY Left    fusion   COLONOSCOPY W/ BIOPSIES AND POLYPECTOMY     IR ANGIO EXTRACRAN SEL COM CAROTID INNOMINATE UNI BILAT MOD SED  11/30/2018   IR ANGIO VERTEBRAL SEL VERTEBRAL BILAT MOD SED  11/30/2018   IR ANGIOGRAM SELECTIVE EACH ADDITIONAL VESSEL  11/30/2018   MULTIPLE TOOTH EXTRACTIONS     TOTAL KNEE ARTHROPLASTY Right    VIDEO ASSISTED THORACOSCOPY (VATS)/ LOBECTOMY Left 04/21/2017   Procedure: VIDEO ASSISTED THORACOSCOPY (VATS)/LEFT UPPER LOBECTOMY;  Surgeon: Melrose Nakayama, MD;  Location: [facility];  Service: Thoracic;  Laterality: Left;   VIDEO BRONCHOSCOPY WITH ENDOBRONCHIAL NAVIGATION N/A 01/21/2017   Procedure: VIDEO BRONCHOSCOPY WITH ENDOBRONCHIAL NAVIGATION;  Surgeon: Melrose Nakayama, MD;  Location: [facility];  Service: Thoracic;  Laterality: N/A;   VIDEO BRONCHOSCOPY WITH ENDOBRONCHIAL ULTRASOUND N/A 01/21/2017   Procedure: VIDEO BRONCHOSCOPY WITH ENDOBRONCHIAL ULTRASOUND;  Surgeon: Melrose Nakayama, MD;  Location: [facility];  Service: Thoracic;  Laterality: N/A;   Social History   Occupational History   Not on file  Tobacco Use   Smoking status: Former    Packs/day: 0.25    Years: 12.00    Pack years: 3.00    Types:  Cigarettes    Quit date: 03/2017    Years since quitting: 4.4   Smokeless tobacco: Never  Vaping Use   Vaping Use: Never used  Substance and Sexual Activity   Alcohol use: No   Drug use: No   Sexual activity: Not Currently

## 2021-08-28 ENCOUNTER — Ambulatory Visit: Payer: Medicare PPO | Admitting: Physical Therapy

## 2021-08-28 DIAGNOSIS — M6281 Muscle weakness (generalized): Secondary | ICD-10-CM | POA: Diagnosis not present

## 2021-08-28 DIAGNOSIS — R293 Abnormal posture: Secondary | ICD-10-CM

## 2021-08-28 DIAGNOSIS — M542 Cervicalgia: Secondary | ICD-10-CM

## 2021-08-28 DIAGNOSIS — M25511 Pain in right shoulder: Secondary | ICD-10-CM | POA: Diagnosis not present

## 2021-08-28 DIAGNOSIS — R2689 Other abnormalities of gait and mobility: Secondary | ICD-10-CM

## 2021-08-28 DIAGNOSIS — G8929 Other chronic pain: Secondary | ICD-10-CM

## 2021-08-28 NOTE — Therapy (Signed)
[facility] Physical Therapy [address] [place], [place], [address] Phone: [phone]   Fax:  [phone]  Physical Therapy Treatment  Patient Details  Name: [name] [name] MRN: [id number] Date of Birth: [birth year] Referring Provider (PT): Dr. Durward Fortes   Encounter Date: 08/28/2021   PT End of Session - 08/28/21 1139     Visit Number 2    Number of Visits 12    Date for PT Re-Evaluation 09/28/21    Authorization Type [employer] MCR    Authorization - Visit Number 2    Authorization - Number of Visits --   authorization submitted 1/16   Progress Note Due on Visit 10    PT Start Time 1100    PT Stop Time 1194    PT Time Calculation (min) 45 min    Activity Tolerance Patient tolerated treatment well    Behavior During Therapy WFL for tasks assessed/performed             Past Medical History:  Diagnosis Date   Anginal pain ([place])    Arthritis    Asthma    Coronary artery disease    Diabetes mellitus    Dyspnea    Early cataracts, bilateral    GERD (gastroesophageal reflux disease)    occ   Hypertension    Lung cancer ([place])    Mass of upper lobe of left lung    mediastinal adenopathy   Palpitations    Pneumonia    Stroke ([place])    mini stroke   Wears dentures    Wears glasses     Past Surgical History:  Procedure Laterality Date   ABDOMINAL HYSTERECTOMY     ANKLE SURGERY Left    fusion   COLONOSCOPY W/ BIOPSIES AND POLYPECTOMY     IR ANGIO EXTRACRAN SEL COM CAROTID INNOMINATE UNI BILAT MOD SED  11/30/2018   IR ANGIO VERTEBRAL SEL VERTEBRAL BILAT MOD SED  11/30/2018   IR ANGIOGRAM SELECTIVE EACH ADDITIONAL VESSEL  11/30/2018   MULTIPLE TOOTH EXTRACTIONS     TOTAL KNEE ARTHROPLASTY Right    VIDEO ASSISTED THORACOSCOPY (VATS)/ LOBECTOMY Left 04/21/2017   Procedure: VIDEO ASSISTED THORACOSCOPY (VATS)/LEFT UPPER LOBECTOMY;  Surgeon: Melrose Nakayama, MD;  Location: [place];  Service: Thoracic;  Laterality: Left;   VIDEO BRONCHOSCOPY WITH  ENDOBRONCHIAL NAVIGATION N/A 01/21/2017   Procedure: VIDEO BRONCHOSCOPY WITH ENDOBRONCHIAL NAVIGATION;  Surgeon: Melrose Nakayama, MD;  Location: [place];  Service: Thoracic;  Laterality: N/A;   VIDEO BRONCHOSCOPY WITH ENDOBRONCHIAL ULTRASOUND N/A 01/21/2017   Procedure: VIDEO BRONCHOSCOPY WITH ENDOBRONCHIAL ULTRASOUND;  Surgeon: Melrose Nakayama, MD;  [facility];  Service: Thoracic;  Laterality: N/A;    There were no vitals filed for this visit.   Subjective Assessment - 08/28/21 1123     Subjective sher relays the pain is a little better overall, she has been doing some of the exercises    Diagnostic tests Imaging: Rt shoulder XR negative for acute findings but OA noted. Neck XR no acute fractures but advanced DDD and facet arthropathy, loss of normal cervical curve.    Patient Stated Goals reduce pain    Pain Onset More than a month ago                [facility] PT Assessment - 08/28/21 0001       Assessment   Medical Diagnosis Neck pain, recent fall onto Rt shoulder    Referring Provider (PT) Dr. Deeann Saint Balance  Test   Sit to Stand Able to stand  independently using hands    Standing Unsupported Able to stand safely 2 minutes    Sitting with Back Unsupported but Feet Supported on Floor or Stool Able to sit safely and securely 2 minutes    Stand to Sit Controls descent by using hands    Transfers Able to transfer safely, definite need of hands    Standing Unsupported with Eyes Closed Able to stand 10 seconds with supervision    Standing Unsupported with Feet Together Able to place feet together independently and stand for 1 minute with supervision    From Standing, Reach Forward with Outstretched Arm Can reach forward >12 cm safely (5")    From Standing Position, Pick up Object from Floor Able to pick up shoe, needs supervision    From Standing Position, Turn to Look Behind Over each Shoulder Looks behind from both sides and weight shifts well    Turn 360  Degrees Needs close supervision or verbal cueing    Standing Unsupported, Alternately Place Feet on Step/Stool Able to complete 4 steps without aid or supervision    Standing Unsupported, One Foot in Front Needs help to step but can hold 15 seconds    Standing on One Leg Unable to try or needs assist to prevent fall    Total Score 37                           [facility] Adult PT Treatment/Exercise - 08/28/21 0001       Exercises   Exercises Other Exercises    Other Exercises  UBE L1 3 min fwd and 3 min retro, shoulder pulleys 2 min abd, 2 min flexion, upper trap stretch 10 sec X 5 bilat, seated scapular rows red X15, seated bilat shoulder ER with red X15      Moist Heat Therapy   Number Minutes Moist Heat 10 Minutes    Moist Heat Location Shoulder;Cervical                       PT Short Term Goals - 08/28/21 1146       PT SHORT TERM GOAL #1   Title Patient will demonstrate independent use of home exercise program to maintain progress from in clinic treatments.    Baseline -    Time 4    Period Weeks    Status On-going    Target Date 09/14/21      PT SHORT TERM GOAL #2   Title Pt will reduce pain 25% overall    Baseline 8    Time 4    Period Weeks    Status On-going               PT Long Term Goals - 08/28/21 1144       PT LONG TERM GOAL #1   Title Pt wil reduce pain overall 50% to less than 4/10 with usual activity    Time 6    Period Weeks    Status On-going    Target Date 09/28/21      PT LONG TERM GOAL #2   Title Pt will improve FOTO to 60% functional    Time 6    Period Weeks    Status On-going    Target Date 09/28/21      PT LONG TERM GOAL #3   Title Patient will demonstrate cervical AROM and Rt shoulder  AROM WFL to facilitate daily activity including driving, self care at PLOF s limitation due to symptoms.    Baseline severe limitations due to pain and stiffness    Time 6    Period Weeks    Status On-going    Target  Date 09/28/21      PT LONG TERM GOAL #4   Title Pt will improve Rt shoulder strength to 4/5 MMT grossly for functional strength    Baseline 3/5 for abd and ER    Time 6    Period Weeks    Status New      PT LONG TERM GOAL #5   Title Pt will perform at least 40/56 points on berg balance test to show improved balance.    Baseline 37    Time 6    Period Weeks    Status On-going    Target Date 09/28/21                   Plan - 08/28/21 1141     Clinical Impression Statement Session focused on HEP review for neck and Rt shoulder ROM and strengthening and she had fair overall tolerance to this. We then finished BERG balance assessement with score of 37/56 indicating she is at a high risk of falling. We will add more balance interventions into her PT sessions and HEP moving forward.    Personal Factors and Comorbidities Comorbidity 3+    Comorbidities HTN, DM, history of cancer (lung), CVA, GERD    Examination-Activity Limitations Bed Mobility;Bend;Carry;Lift;Stand;Stairs;Squat;Sleep;Reach Overhead;Locomotion Level;Transfers    Examination-Participation Restrictions Cleaning;Community Activity;Driving;Yard Work;Laundry;Shop    Stability/Clinical Decision Making Evolving/Moderate complexity    Rehab Potential Good    PT Frequency 2x / week   1-2   PT Duration 6 weeks    PT Treatment/Interventions ADLs/Self Care Home Management;Cryotherapy;Electrical Stimulation;Moist Heat;Traction;Ultrasound;Therapeutic activities;Therapeutic exercise;Neuromuscular re-education;Manual techniques;Passive range of motion;Dry needling;Joint Manipulations;Vasopneumatic Device;Taping    PT Next Visit Plan add balance into HEP    PT Home Exercise Plan Access Code: [email or web address]    Consulted and Agree with Plan of Care Patient             Patient will benefit from skilled therapeutic intervention in order to improve the following deficits and impairments:  Decreased activity tolerance, Decreased  balance, Decreased endurance, Decreased mobility, Decreased range of motion, Decreased strength, Difficulty walking, Postural dysfunction, Impaired flexibility, Pain  Visit Diagnosis: Cervicalgia  Chronic right shoulder pain  Muscle weakness (generalized)  Abnormal posture  Other abnormalities of gait and mobility     Problem List Patient Active Problem List   Diagnosis Date Noted   Pain in right shoulder 08/27/2021   Cervical spine arthritis 08/27/2021   Allergic rhinitis 12/07/2019   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   Hyperlipidemia 12/07/2019   Hypertensive heart and renal disease 12/07/2019   Overweight 12/07/2019   Smoker 12/07/2019   Vitamin D deficiency 12/07/2019   Hypertensive emergency 12/02/2018   Subarachnoid hemorrhage ([place]) 11/29/2018   S/P lobectomy of lung 04/21/2017   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 ([place]) 02/16/2017   Solitary pulmonary nodule 02/08/2017   Shortness of breath 12/29/2016    Class: Acute   Gastroesophageal reflux disease 03/04/2015   Peripheral vascular disease, unspecified ([place]) 07/12/2013   Atherosclerosis of native arteries of the extremities with intermittent claudication 07/12/2013   Chest pain, cardiac 06/08/2011    Class: Acute   Diabetes mellitus ([place]) 06/08/2011    Class: History of   HTN (  hypertension), benign 06/08/2011    Class: Chronic   Obesity (BMI 30-39.9) 06/08/2011    Class: History of   Coronary artery disease 06/08/2011    Class: History of    Debbe Odea, PT,DPT 08/28/2021, 11:46 AM  [facility] Physical Therapy [address] [place], [place], [address] Phone: [phone]   Fax:  [phone]  Name: [name] [name] MRN: [id number] Date of Birth: 02/09/1935

## 2021-08-30 ENCOUNTER — Encounter (HOSPITAL_COMMUNITY): Payer: Self-pay

## 2021-08-30 ENCOUNTER — Emergency Department (HOSPITAL_COMMUNITY): Payer: Medicare PPO

## 2021-08-30 ENCOUNTER — Observation Stay (HOSPITAL_COMMUNITY): Payer: Medicare PPO

## 2021-08-30 ENCOUNTER — Observation Stay (HOSPITAL_COMMUNITY)
Admission: EM | Admit: 2021-08-30 | Discharge: 2021-08-31 | Disposition: A | Discharge status: Home / Self Care | Payer: Medicare PPO | Attending: Cardiology | Admitting: Cardiology

## 2021-08-30 ENCOUNTER — Other Ambulatory Visit: Payer: Self-pay

## 2021-08-30 DIAGNOSIS — Z20822 Contact with and (suspected) exposure to covid-19: Secondary | ICD-10-CM | POA: Insufficient documentation

## 2021-08-30 DIAGNOSIS — Z85118 Personal history of other malignant neoplasm of bronchus and lung: Secondary | ICD-10-CM | POA: Insufficient documentation

## 2021-08-30 DIAGNOSIS — Z7982 Long term (current) use of aspirin: Secondary | ICD-10-CM | POA: Insufficient documentation

## 2021-08-30 DIAGNOSIS — I249 Acute ischemic heart disease, unspecified: Principal | ICD-10-CM | POA: Insufficient documentation

## 2021-08-30 DIAGNOSIS — N189 Chronic kidney disease, unspecified: Secondary | ICD-10-CM

## 2021-08-30 DIAGNOSIS — Z87891 Personal history of nicotine dependence: Secondary | ICD-10-CM | POA: Insufficient documentation

## 2021-08-30 DIAGNOSIS — Z79899 Other long term (current) drug therapy: Secondary | ICD-10-CM | POA: Insufficient documentation

## 2021-08-30 DIAGNOSIS — I10 Essential (primary) hypertension: Secondary | ICD-10-CM | POA: Insufficient documentation

## 2021-08-30 DIAGNOSIS — I2510 Atherosclerotic heart disease of native coronary artery without angina pectoris: Secondary | ICD-10-CM | POA: Diagnosis not present

## 2021-08-30 DIAGNOSIS — E119 Type 2 diabetes mellitus without complications: Secondary | ICD-10-CM | POA: Diagnosis not present

## 2021-08-30 DIAGNOSIS — R079 Chest pain, unspecified: Secondary | ICD-10-CM

## 2021-08-30 DIAGNOSIS — R7989 Other specified abnormal findings of blood chemistry: Secondary | ICD-10-CM

## 2021-08-30 DIAGNOSIS — Z8673 Personal history of transient ischemic attack (TIA), and cerebral infarction without residual deficits: Secondary | ICD-10-CM | POA: Insufficient documentation

## 2021-08-30 DIAGNOSIS — Z7984 Long term (current) use of oral hypoglycemic drugs: Secondary | ICD-10-CM | POA: Diagnosis not present

## 2021-08-30 DIAGNOSIS — J45909 Unspecified asthma, uncomplicated: Secondary | ICD-10-CM | POA: Diagnosis not present

## 2021-08-30 DIAGNOSIS — R0789 Other chest pain: Secondary | ICD-10-CM | POA: Diagnosis present

## 2021-08-30 LAB — BASIC METABOLIC PANEL
Anion gap: 11 (ref 5–15)
BUN: 23 mg/dL (ref 8–23)
CO2: 19 mmol/L — ABNORMAL LOW (ref 22–32)
Calcium: 8.9 mg/dL (ref 8.9–10.3)
Chloride: 110 mmol/L (ref 98–111)
Creatinine, Ser: 1.07 mg/dL — ABNORMAL HIGH (ref 0.44–1.00)
GFR, Estimated: 51 mL/min — ABNORMAL LOW (ref >60)
Glucose, Bld: 136 mg/dL — ABNORMAL HIGH (ref 70–99)
Potassium: 5.1 mmol/L (ref 3.5–5.1)
Sodium: 140 mmol/L (ref 135–145)

## 2021-08-30 LAB — RESP PANEL BY RT-PCR (FLU A&B, COVID) ARPGX2
Influenza A by PCR: NEGATIVE
Influenza B by PCR: NEGATIVE
SARS Coronavirus 2 by RT PCR: NEGATIVE

## 2021-08-30 LAB — CBC
HCT: 36.7 % (ref 36.0–46.0)
Hemoglobin: 11.8 g/dL — ABNORMAL LOW (ref 12.0–15.0)
MCH: 27.3 pg (ref 26.0–34.0)
MCHC: 32.2 g/dL (ref 30.0–36.0)
MCV: 85 fL (ref 80.0–100.0)
Platelets: 239 10*3/uL (ref 150–400)
RBC: 4.32 MIL/uL (ref 3.87–5.11)
RDW: 15.7 % — ABNORMAL HIGH (ref 11.5–15.5)
WBC: 6.6 10*3/uL (ref 4.0–10.5)
nRBC: 0 % (ref 0.0–0.2)

## 2021-08-30 LAB — HEMOGLOBIN A1C
Hgb A1c MFr Bld: 6.1 % — ABNORMAL HIGH (ref 4.8–5.6)
Mean Plasma Glucose: 128.37 mg/dL

## 2021-08-30 LAB — TROPONIN I (HIGH SENSITIVITY)
Troponin I (High Sensitivity): 16 ng/L (ref <18)
Troponin I (High Sensitivity): 18 ng/L — ABNORMAL HIGH (ref <18)
Troponin I (High Sensitivity): 20 ng/L — ABNORMAL HIGH (ref <18)

## 2021-08-30 LAB — PROTIME-INR
INR: 1.1 (ref 0.8–1.2)
Prothrombin Time: 13.9 seconds (ref 11.4–15.2)

## 2021-08-30 LAB — D-DIMER, QUANTITATIVE: D-Dimer, Quant: 0.94 ug/mL-FEU — ABNORMAL HIGH (ref 0.00–0.50)

## 2021-08-30 LAB — CBG MONITORING, ED
Glucose-Capillary: 137 mg/dL — ABNORMAL HIGH (ref 70–99)
Glucose-Capillary: 109 mg/dL — ABNORMAL HIGH (ref 70–99)

## 2021-08-30 LAB — GLUCOSE, CAPILLARY: Glucose-Capillary: 127 mg/dL — ABNORMAL HIGH (ref 70–99)

## 2021-08-30 MED ORDER — INSULIN ASPART 100 UNIT/ML IJ SOLN
0.0000 [IU] | Freq: Three times a day (TID) | INTRAMUSCULAR | Status: DC
  Administered 2021-08-31 (×2): 1 [IU] via SUBCUTANEOUS

## 2021-08-30 MED ORDER — ACETAMINOPHEN 325 MG PO TABS
650.0000 mg | ORAL_TABLET | ORAL | Status: DC | PRN
  Administered 2021-08-30 – 2021-08-31 (×2): 650 mg via ORAL
  Filled 2021-08-30: qty 2

## 2021-08-30 MED ORDER — AMLODIPINE BESYLATE 5 MG PO TABS
5.0000 mg | ORAL_TABLET | Freq: Every day | ORAL | Status: DC
  Administered 2021-08-30 – 2021-08-31 (×2): 5 mg via ORAL
  Filled 2021-08-30 (×2): qty 1

## 2021-08-30 MED ORDER — ATORVASTATIN CALCIUM 80 MG PO TABS
80.0000 mg | ORAL_TABLET | Freq: Every day | ORAL | Status: DC
  Administered 2021-08-30 – 2021-08-31 (×2): 80 mg via ORAL
  Filled 2021-08-30 (×2): qty 1

## 2021-08-30 MED ORDER — ASPIRIN EC 81 MG PO TBEC
81.0000 mg | DELAYED_RELEASE_TABLET | Freq: Every day | ORAL | Status: DC
  Administered 2021-08-31: 81 mg via ORAL
  Filled 2021-08-30: qty 1

## 2021-08-30 MED ORDER — NITROGLYCERIN 2 % TD OINT
1.0000 [in_us] | TOPICAL_OINTMENT | Freq: Four times a day (QID) | TRANSDERMAL | Status: DC
  Administered 2021-08-30 – 2021-08-31 (×4): 1 [in_us] via TOPICAL
  Filled 2021-08-30 (×4): qty 1

## 2021-08-30 MED ORDER — ASPIRIN 81 MG PO CHEW
324.0000 mg | CHEWABLE_TABLET | ORAL | Status: AC
  Administered 2021-08-30: 324 mg via ORAL
  Filled 2021-08-30: qty 4

## 2021-08-30 MED ORDER — NITROGLYCERIN 0.4 MG SL SUBL: 0.4000 mg | SUBLINGUAL_TABLET | SUBLINGUAL | Status: DC | PRN

## 2021-08-30 MED ORDER — ASPIRIN 81 MG PO CHEW: 324.0000 mg | CHEWABLE_TABLET | Freq: Once | ORAL | Status: DC

## 2021-08-30 MED ORDER — ASPIRIN 300 MG RE SUPP: 300.0000 mg | RECTAL | Status: AC

## 2021-08-30 MED ORDER — METOPROLOL TARTRATE 12.5 MG HALF TABLET
12.5000 mg | ORAL_TABLET | Freq: Two times a day (BID) | ORAL | Status: DC
  Administered 2021-08-30 – 2021-08-31 (×2): 12.5 mg via ORAL
  Filled 2021-08-30 (×2): qty 1

## 2021-08-30 MED ORDER — ONDANSETRON HCL 4 MG/2ML IJ SOLN: 4.0000 mg | Freq: Four times a day (QID) | INTRAMUSCULAR | Status: DC | PRN

## 2021-08-30 MED ORDER — SODIUM CHLORIDE 0.9 % IV SOLN: INTRAVENOUS | Status: DC

## 2021-08-30 MED ORDER — HEPARIN (PORCINE) 25000 UT/250ML-% IV SOLN
1050.0000 [IU]/h | INTRAVENOUS | Status: DC
  Administered 2021-08-30: 900 [IU]/h via INTRAVENOUS
  Filled 2021-08-30: qty 250

## 2021-08-30 MED ORDER — IOHEXOL 350 MG/ML SOLN
100.0000 mL | Freq: Once | INTRAVENOUS | Status: AC | PRN
  Administered 2021-08-30: 100 mL via INTRAVENOUS

## 2021-08-30 MED ORDER — PANTOPRAZOLE SODIUM 40 MG PO TBEC
40.0000 mg | DELAYED_RELEASE_TABLET | Freq: Every day | ORAL | Status: DC
  Administered 2021-08-31: 40 mg via ORAL
  Filled 2021-08-30: qty 1

## 2021-08-30 NOTE — Progress Notes (Signed)
ANTICOAGULATION CONSULT NOTE - Initial Consult  Pharmacy Consult for Heparin Indication: chest pain/ACS  Allergies  Allergen Reactions   Penicillins Rash and Other (See Comments)    PATIENT HAS HAD A PCN REACTION WITH IMMEDIATE RASH, FACIAL/TONGUE/THROAT SWELLING, SOB, OR LIGHTHEADEDNESS WITH HYPOTENSION:  #  #  #  YES  #  #  #  Has patient had a PCN reaction causing severe rash involving mucus membranes or skin necrosis: NO Has patient had a PCN reaction that required hospitalization NO Has patient had a PCN reaction occurring within the last 10 years: NO   Tramadol Itching   Codeine Rash    Patient Measurements: Height: 5\' 9"  (175.3 cm) Weight: 73.5 kg (162 lb) IBW/kg (Calculated) : 66.2 Heparin Dosing Weight: 73.5 kg  Vital Signs: Temp: 97.9 F (36.6 C) (01/29 1315) Temp Source: Oral (01/29 1315) BP: 151/53 (01/29 1415) Pulse Rate: 57 (01/29 1415)  Labs: Recent Labs    08/30/21 1344  HGB 11.8*  HCT 36.7  PLT 239  CREATININE 1.07*  TROPONINIHS 16    Estimated Creatinine Clearance: 40.9 mL/min (A) (by C-G formula based on SCr of 1.07 mg/dL (H)).   Medical History: Past Medical History:  Diagnosis Date   Anginal pain ([place])    Arthritis    Asthma    Coronary artery disease    Diabetes mellitus    Dyspnea    Early cataracts, bilateral    GERD (gastroesophageal reflux disease)    occ   Hypertension    Lung cancer ([facility])    Mass of upper lobe of left lung    mediastinal adenopathy   Palpitations    Pneumonia    Stroke [facility])    mini stroke   Wears dentures    Wears glasses     Medications:  (Not in a hospital admission)  Scheduled:   aspirin  324 mg Oral Once   nitroGLYCERIN  1 inch Topical Q6H   Infusions:   Assessment: [name] [name] with a history of CVA, treated diabetes, hypertension and hypercholesterolemia . Patient is presenting with chest pain. Heparin per pharmacy consult placed for chest pain/ACS.  Patient is not on anticoagulation prior to  arrival.  Hgb 11.8; plt 239  Goal of Therapy:  Heparin level 0.3-0.7 units/ml Monitor platelets by anticoagulation protocol: Yes   Plan:  No initial bolus Start heparin infusion at 900 units/hr Check anti-Xa level in 8 hours and daily while on heparin Continue to monitor H&H and platelets  Lorelei Pont, PharmD, BCPS 08/30/2021 3:35 PM ED Clinical Pharmacist -  [phone]

## 2021-08-30 NOTE — ED Notes (Signed)
Pt To ct from previous room

## 2021-08-30 NOTE — ED Triage Notes (Signed)
Pt bib [facility] from home with complaints of non-radiating centralized cp that started this am while sitting on her bed. Pt took two nitroglycerin tablets prior to ems arrival with no success. On ems arrival, pt was in NSR but pt had an episode of nausea and then was in afib at a rate of 48. Pt has been in afib since then ranging 40-130.  Pt was given 324mg  ASA with ems. EMS vitals:140/90, 99% RA, 102 CBG

## 2021-08-30 NOTE — ED Provider Notes (Signed)
[facility] EMERGENCY DEPARTMENT Provider Note   CSN: [id number] Arrival date & time: 08/30/21  1305     History  Chief complaint: Chest pain  [name] [name] is a 87 y.o. female.  HPI  Patient has a history of hypertension, diabetes, coronary artery disease, stroke and angina.  Patient presented to ED with complaints of chest pain.  Patient states that suddenly started within the last couple of hours.  She tried taking some nitroglycerin.  It helped somewhat but the symptoms have not completely resolved.  Patient did have an episode of vomiting as well as diaphoresis with the pain.  Home Medications Prior to Admission medications   Medication Sig Start Date End Date Taking? Authorizing Provider  albuterol (VENTOLIN HFA) 108 (90 Base) MCG/ACT inhaler albuterol sulfate HFA 90 mcg/actuation aerosol inhaler    [provider]  amLODipine (NORVASC) 10 MG tablet Take 10 mg by mouth daily.      [provider]  AQUALANCE LANCETS 30G [place]  11/02/17   [provider]  aspirin EC 81 MG tablet Take 81 mg by mouth daily.      [provider]  atorvastatin (LIPITOR) 20 MG tablet Take 20 mg by mouth daily at 6 PM.  11/16/17   [provider]  Blood Glucose Calibration (OT ULTRA/FASTTK CNTRL SOLN) SOLN  11/02/17   [provider]  Blood Glucose Monitoring Suppl (ONE TOUCH ULTRA 2) w/Device KIT  11/02/17   [provider]  Cholecalciferol (VITAMIN D3) 125 MCG (5000 UT) TABS Take 5,000 Units by mouth daily.     [provider]  diclofenac (VOLTAREN) 25 MG EC tablet Take 1 tablet (25 mg total) by mouth 2 (two) times daily as needed. 12/26/19   Hilts, Legrand Como, MD  diclofenac Sodium (VOLTAREN) 1 % GEL Apply 4 g topically 4 (four) times daily as needed. 01/13/21   Hilts, Legrand Como, MD  ergocalciferol (VITAMIN D2) 1.25 MG (50000 UT) capsule ergocalciferol (vitamin D2) 1,250 mcg (50,000 unit) capsule    [provider]   gabapentin (NEURONTIN) 100 MG capsule Take 1 capsule (100 mg total) by mouth at bedtime. 08/18/21   Pieter Partridge, DO  hydrALAZINE (APRESOLINE) 25 MG tablet Take 25 mg by mouth 2 (two) times daily.     [provider]  isosorbide dinitrate (ISORDIL) 20 MG tablet Take 20 mg by mouth 2 (two) times daily.     [provider]  Lancet Devices (ADJUSTABLE LANCING DEVICE) MISC  11/02/17   [provider]  meclizine (ANTIVERT) 25 MG tablet Take 25 mg by mouth 2 (two) times daily as needed for dizziness.    [provider]  metFORMIN (GLUCOPHAGE) 500 MG tablet Take 500 mg by mouth 2 (two) times daily.     [provider]  methocarbamol (ROBAXIN) 500 MG tablet Take 1 tablet (500 mg total) by mouth every 8 (eight) hours as needed for muscle spasms. 07/30/21   Persons, Bevely Palmer, PA  methylPREDNISolone (MEDROL DOSEPAK) 4 MG TBPK tablet As directed for 6 days. 11/29/19   Hilts, Legrand Como, MD  metoprolol succinate (TOPROL-XL) 25 MG 24 hr tablet Take 25 mg by mouth every evening.     [provider]  nitroGLYCERIN (NITROSTAT) 0.4 MG SL tablet Place 0.4 mg under the tongue every 5 (five) minutes as needed for chest pain.    [provider]  omeprazole (PRILOSEC) 20 MG capsule Take 20 mg by mouth daily. 11/02/18   [provider]  ONE TOUCH ULTRA TEST test strip  11/02/17   [provider]  pioglitazone (ACTOS) 30 MG tablet Take 30 mg by mouth every evening.     [provider]  temazepam (RESTORIL) 15 MG capsule Take 15 mg by mouth at bedtime as needed. 11/15/18   [provider]  valsartan (DIOVAN) 160 MG tablet Take 160 mg by mouth daily.  11/29/17   [provider]      Allergies    Penicillins, Tramadol, and Codeine    Review of Systems   Review of Systems  Constitutional:  Negative for fever.  Cardiovascular:  Positive for chest pain.   Physical Exam Updated Vital Signs BP (!) 170/70 (BP Location: Right  Arm)    Pulse 71    Temp 97.9 F (36.6 C) (Oral)    Resp 19    Ht 1.753 m (5' 9" )    Wt 73.5 kg    SpO2 97%    BMI 23.92 kg/m  Physical Exam Vitals and nursing note reviewed.  Constitutional:      Appearance: She is well-developed. She is not diaphoretic.  HENT:     Head: Normocephalic and atraumatic.     Right Ear: External ear normal.     Left Ear: External ear normal.  Eyes:     General: No scleral icterus.       Right eye: No discharge.        Left eye: No discharge.     Conjunctiva/sclera: Conjunctivae normal.  Neck:     Trachea: No tracheal deviation.  Cardiovascular:     Rate and Rhythm: Normal rate and regular rhythm.  Pulmonary:     Effort: Pulmonary effort is normal. No respiratory distress.     Breath sounds: Normal breath sounds. No stridor. No wheezing or rales.  Abdominal:     General: Bowel sounds are normal. There is no distension.     Palpations: Abdomen is soft.     Tenderness: There is no abdominal tenderness. There is no guarding or rebound.  Musculoskeletal:        General: No tenderness or deformity.     Cervical back: Neck supple.  Skin:    General: Skin is warm and dry.     Findings: No rash.  Neurological:     General: No focal deficit present.     Mental Status: She is alert.     Cranial Nerves: No cranial nerve deficit (no facial droop, extraocular movements intact, no slurred speech).     Sensory: No sensory deficit.     Motor: No abnormal muscle tone or seizure activity.     Coordination: Coordination normal.  Psychiatric:        Mood and Affect: Mood normal.    ED Results / Procedures / Treatments   Labs (all labs ordered are listed, but only abnormal results are displayed) Labs Reviewed  CBG MONITORING, ED - Abnormal; Notable for the following components:      Result Value   Glucose-Capillary 137 (*)    All other components within normal limits  BASIC METABOLIC PANEL  CBC  D-DIMER, QUANTITATIVE  TROPONIN I (HIGH SENSITIVITY)     EKG EKG Interpretation  Date/Time:  Sunday August 30 2021 13:16:24 EST Ventricular Rate:  63 PR Interval:  191 QRS Duration: 91 QT Interval:  446 QTC Calculation: 457 R Axis:   50 Text Interpretation: Sinus rhythm Atrial premature complexes in couplets Borderline T abnormalities, anterior leads No significant change since last tracing  Confirmed by Dorie Rank [id number]) on 08/30/2021 1:20:14 PM  Radiology No results found.  Procedures Procedures    Medications Ordered in ED Medications  aspirin chewable tablet 324 mg (324 mg Oral Not Given 08/30/21 1333)  nitroGLYCERIN (NITROGLYN) 2 % ointment 1 inch (1 inch Topical Given 08/30/21 1348)    ED Course/ Medical Decision Making/ A&P Clinical Course as of 08/31/21 0800  Sun Aug 30, 2021  1326 Reviewed prior records.  Last nuclear medicine test noted to be in 2016.  Low risk at that time [JK]  1448 D-dimer, quantitative(!) Elevated [JK]  1449 Troponin I (High Sensitivity) Normal [JK]  1449 CBC(!) Normal [JK]  1449 DG Chest Portable 1 View Chest x-ray images reviewed pathology report reviewed.  Consistent with fibrosis but no acute findings [JK]  1510 Discussed case with Dr. Terrence Dupont regarding admission.  Recommends starting heparin.  He will come see the patient in the hospital [JK]    Clinical Course User Index [JK] Dorie Rank, MD                           Medical Decision Making Amount and/or Complexity of Data Reviewed Labs: ordered. Decision-making details documented in ED Course. Radiology: ordered. Decision-making details documented in ED Course.  Risk OTC drugs. Prescription drug management. Decision regarding hospitalization.   Chest pain Hx of CAD.  Presentation concerning for ACS.  Initial troponin normal but high risk heart score.  Discussed case wth Dr Terrence Dupont.  Pt started on IV heparin.  Admit for further workup  Elevated d dimer With elevated d dimer will CT to rule out pt  CKD Stable, still should  be able to proceed with CT angio       Final Clinical Impression(s) / ED Diagnoses Final diagnoses:  Chest pain, unspecified type  Positive D dimer  Chronic kidney disease, unspecified CKD stage      Dorie Rank, MD 08/31/21 [phone]

## 2021-08-30 NOTE — H&P (Signed)
[name] [name] is an 87 y.o. female.   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  First set of high-sensitivity troponin I is negative patient denies any history of exertional chest pain although her activity is limited.  Denies any recent cardiac work-up.  Past Medical History:  Diagnosis Date   Anginal pain ([place])    Arthritis    Asthma    Coronary artery disease    Diabetes mellitus    Dyspnea    Early cataracts, bilateral    GERD (gastroesophageal reflux disease)    occ   Hypertension    Lung cancer ([facility])    Mass of upper lobe of left lung    mediastinal adenopathy   Palpitations    Pneumonia    Stroke ([facility])    mini stroke   Wears dentures    Wears glasses     Past Surgical History:  Procedure Laterality Date   ABDOMINAL HYSTERECTOMY     ANKLE SURGERY Left    fusion   COLONOSCOPY W/ BIOPSIES AND POLYPECTOMY     IR ANGIO EXTRACRAN SEL COM CAROTID INNOMINATE UNI BILAT MOD SED  11/30/2018   IR ANGIO VERTEBRAL SEL VERTEBRAL BILAT MOD SED  11/30/2018   IR ANGIOGRAM SELECTIVE EACH ADDITIONAL VESSEL  11/30/2018   MULTIPLE TOOTH EXTRACTIONS     TOTAL KNEE ARTHROPLASTY Right    VIDEO ASSISTED THORACOSCOPY (VATS)/ LOBECTOMY Left  04/21/2017   Procedure: VIDEO ASSISTED THORACOSCOPY (VATS)/LEFT UPPER LOBECTOMY;  Surgeon: Melrose Nakayama, MD;  Location: [facility] OR;  Service: Thoracic;  Laterality: Left;   VIDEO BRONCHOSCOPY WITH ENDOBRONCHIAL NAVIGATION N/A 01/21/2017   Procedure: VIDEO BRONCHOSCOPY WITH ENDOBRONCHIAL NAVIGATION;  Surgeon: Melrose Nakayama, MD;  Location: [facility] OR;  Service: Thoracic;  Laterality: N/A;   VIDEO BRONCHOSCOPY WITH ENDOBRONCHIAL ULTRASOUND N/A 01/21/2017   Procedure: VIDEO BRONCHOSCOPY WITH ENDOBRONCHIAL ULTRASOUND;  Surgeon: Melrose Nakayama, MD;  Location: [facility] OR;  Service: Thoracic;  Laterality: N/A;    Family History  Problem Relation Age of Onset   Diabetes Mother    Diabetes Sister    Hyperlipidemia Sister    Diabetes Brother    Diabetes Son    Cancer Neg Hx    Social History:  reports that she quit smoking about 4 years ago. Her smoking use included cigarettes. She has a 3.00 pack-year smoking history. She has never used smokeless tobacco. She reports that she does not drink alcohol and does not use drugs.  Allergies:  Allergies  Allergen Reactions   Penicillins Rash and Other (See Comments)    PATIENT HAS HAD A PCN REACTION WITH IMMEDIATE RASH, FACIAL/TONGUE/THROAT SWELLING, SOB, OR LIGHTHEADEDNESS WITH HYPOTENSION:  #  #  #  YES  #  #  #  Has patient had a PCN reaction causing severe rash involving mucus membranes or skin necrosis:  NO Has patient had a PCN reaction that required hospitalization NO Has patient had a PCN reaction occurring within the last 10 years: NO   Tramadol Itching   Codeine Rash    (Not in a hospital admission)   Results for orders placed or performed during the hospital encounter of 08/30/21 (from the past 48 hour(s))  CBG monitoring, ED     Status: Abnormal   Collection Time: 08/30/21  1:43 PM  Result Value Ref Range   Glucose-Capillary 137 (H) 70 - 99 mg/dL    Comment: Glucose reference range applies only to samples taken after fasting for  at least 8 hours.   Comment 1 Notify RN    Comment 2 Document in Chart   Basic metabolic panel     Status: Abnormal   Collection Time: 08/30/21  1:44 PM  Result Value Ref Range   Sodium 140 135 - 145 mmol/L   Potassium 5.1 3.5 - 5.1 mmol/L   Chloride 110 98 - 111 mmol/L   CO2 19 (L) 22 - 32 mmol/L   Glucose, Bld 136 (H) 70 - 99 mg/dL    Comment: Glucose reference range applies only to samples taken after fasting for at least 8 hours.   BUN 23 8 - 23 mg/dL   Creatinine, Ser 1.07 (H) 0.44 - 1.00 mg/dL   Calcium 8.9 8.9 - 10.3 mg/dL   GFR, Estimated 51 (L) >60 mL/min    Comment: (NOTE) Calculated using the CKD-EPI Creatinine Equation (2021)    Anion gap 11 5 - 15    Comment: Performed at [facility] [address], [place], [place] [address]  Troponin I (High Sensitivity)     Status: None   Collection Time: 08/30/21  1:44 PM  Result Value Ref Range   Troponin I (High Sensitivity) 16 <18 ng/L    Comment: (NOTE) Elevated high sensitivity troponin I (hsTnI) values and significant  changes across serial measurements may suggest ACS but many other  chronic and acute conditions are known to elevate hsTnI results.  Refer to the "Links" section for chest pain algorithms and additional  guidance. Performed at [facility], [place] [address] [place], [place] [address]   CBC     Status: Abnormal   Collection Time: 08/30/21  1:44 PM  Result Value Ref Range   WBC 6.6 4.0 - 10.5 K/uL   RBC 4.32 3.87 - 5.11 MIL/uL   Hemoglobin 11.8 (L) 12.0 - 15.0 g/dL   HCT 36.7 36.0 - 46.0 %   MCV 85.0 80.0 - 100.0 fL   MCH 27.3 26.0 - 34.0 pg   MCHC 32.2 30.0 - 36.0 g/dL   RDW 15.7 (H) 11.5 - 15.5 %   Platelets 239 150 - 400 K/uL   nRBC 0.0 0.0 - 0.2 %    Comment: Performed at [facility], [place] [address] [place], [place] [address]  D-dimer, quantitative     Status: Abnormal   Collection Time: 08/30/21  2:07 PM  Result Value Ref Range   D-Dimer, Quant 0.94 (H) 0.00 - 0.50  ug/mL-FEU    Comment: (NOTE) At the manufacturer cut-off value of 0.5 g/mL FEU, this assay has a negative predictive value of 95-100%.This assay is intended for use in conjunction with a clinical pretest probability (PTP) assessment model to exclude pulmonary embolism (PE) and deep venous thrombosis (DVT) in outpatients suspected of PE or DVT. Results should be correlated with clinical presentation. Performed at [facility]  [facility], [address], [place], [place] [address]    DG Chest Portable 1 View  Result Date: 08/30/2021 CLINICAL DATA:  Per nurse notes, "Pt bib [facility] from home with complaints of non-radiating centralized cp that started this am while sitting on her bed. Pt took two nitroglycerin tablets prior to ems arrival with no success. On ems arrival, pt was in NSR but pt had an episode of nausea and then was in afib at a rate of 48. Pt has been in afib since then ranging 40-130."Hx of asthma, CAD, diabetes, dyspnea, GERD, htn, lung cancer EXAM: PORTABLE CHEST 1 VIEW COMPARISON:  03/16/2021 and older exams.  CT, 07/08/2021. FINDINGS: Cardiac silhouette is mildly enlarged. No mediastinal or hilar masses. Heterogeneous bilateral interstitial thickening consistent with fibrosis and similar to the prior exam. No lung consolidation. No convincing pulmonary edema. No pleural effusion or pneumothorax. Skeletal structures are grossly intact. IMPRESSION: 1. No acute cardiopulmonary disease. 2. Findings consistent with pulmonary fibrosis. Electronically Signed   By: Lajean Manes M.D.   On: 08/30/2021 14:01    Review of Systems  Constitutional:  Positive for diaphoresis. Negative for chills.  HENT:  Negative for sore throat.   Eyes:  Negative for visual disturbance.  Respiratory:  Negative for cough and shortness of breath.   Cardiovascular:  Positive for chest pain. Negative for palpitations and leg swelling.  Gastrointestinal:  Negative for abdominal distention.  Genitourinary:  Negative  for difficulty urinating and flank pain.  Neurological:  Negative for dizziness and seizures.   Blood pressure (!) 151/53, pulse (!) 57, temperature 97.9 F (36.6 C), temperature source Oral, resp. rate 17, height 5\' 9"  (1.753 m), weight 73.5 kg, SpO2 97 %. Physical Exam Constitutional:      Appearance: Normal appearance.  HENT:     Head: Normocephalic and atraumatic.  Eyes:     Extraocular Movements: Extraocular movements intact.     Conjunctiva/sclera: Conjunctivae normal.     Pupils: Pupils are equal, round, and reactive to light.  Neck:     Vascular: No carotid bruit.  Cardiovascular:     Rate and Rhythm: Normal rate and regular rhythm.     Heart sounds: Murmur (Soft systolic murmur noted no S3 gallop) heard.  Pulmonary:     Effort: Pulmonary effort is normal.     Breath sounds: Normal breath sounds. No wheezing, rhonchi or rales.  Abdominal:     General: Abdomen is flat. Bowel sounds are normal. There is no distension.     Palpations: Abdomen is soft.     Tenderness: There is no abdominal tenderness.  Musculoskeletal:     Cervical back: Normal range of motion and neck supple.     Comments: No clubbing cyanosis or edema  Skin:    General: Skin is warm and dry.  Neurological:     General: No focal deficit present.     Mental Status: She is alert and oriented to person, place, and time.     Assessment/Plan Acute coronary syndrome Hypertension Diabetes mellitus Hyperlipidemia GERD History of CVA in the past History of carcinoma of lung status post left lobectomy in the past History of subarachnoid hemorrhage in the past Degenerative joint disease Plan As per orders Schedule for nuclear stress test in a.m. Discussed with patient and agrees  Charolette Forward, MD 08/30/2021, 4:06 PM

## 2021-08-30 NOTE — ED Provider Notes (Signed)
[facility] EMERGENCY DEPARTMENT Provider Note   CSN: [id number] Arrival date & time: 08/30/21  1305     History  Chief complaint: Chest pain  [name] [name] is a 87 y.o. female.  HPI  HPI: A 87 year old patient with a history of CVA, treated diabetes, hypertension and hypercholesterolemia presents for evaluation of chest pain. Initial onset of pain was approximately 1-3 hours ago. The patient's chest pain is described as heaviness/pressure/tightness, is not worse with exertion and is relieved by nitroglycerin. The patient complains of nausea and reports some diaphoresis. The patient's chest pain is middle- or left-sided, is not well-localized, is not sharp and does not radiate to the arms/jaw/neck. The patient has no history of peripheral artery disease, has not smoked in the past 90 days, has no relevant family history of coronary artery disease (first degree relative at less than age 94) and does not have an elevated BMI (>=30).   Patient has a history of hypertension, diabetes, coronary artery disease, stroke and angina.  Patient presented to ED with complaints of chest pain.  Patient states that suddenly started within the last couple of hours.  She tried taking some nitroglycerin.  It helped somewhat but the symptoms have not completely resolved.  Patient did have an episode of vomiting as well as diaphoresis with the pain. Home Medications Prior to Admission medications   Medication Sig Start Date End Date Taking? Authorizing Provider  albuterol (VENTOLIN HFA) 108 (90 Base) MCG/ACT inhaler Inhale 2 puffs into the lungs every 6 (six) hours as needed for wheezing or shortness of breath.   Yes [provider]  amLODipine (NORVASC) 10 MG tablet Take 10 mg by mouth daily.     Yes [provider]  aspirin EC 81 MG tablet Take 81 mg by mouth daily.     Yes [provider]  atorvastatin (LIPITOR) 20 MG tablet Take 20 mg by mouth daily at 6 PM.   11/16/17  Yes [provider]  Cholecalciferol (VITAMIN D3) 125 MCG (5000 UT) TABS Take 5,000 Units by mouth daily.    Yes [provider]  diclofenac Sodium (VOLTAREN) 1 % GEL Apply 4 g topically 4 (four) times daily as needed. Patient taking differently: Apply 4 g topically 4 (four) times daily as needed (pain). 01/13/21  Yes Hilts, Legrand Como, MD  gabapentin (NEURONTIN) 100 MG capsule Take 1 capsule (100 mg total) by mouth at bedtime. 08/18/21  Yes Jaffe, Adam R, DO  hydrALAZINE (APRESOLINE) 25 MG tablet Take 25 mg by mouth 2 (two) times daily.    Yes [provider]  isosorbide dinitrate (ISORDIL) 20 MG tablet Take 20 mg by mouth 2 (two) times daily.     [provider]  meclizine (ANTIVERT) 25 MG tablet Take 25 mg by mouth 2 (two) times daily as needed for dizziness.    [provider]  metFORMIN (GLUCOPHAGE) 500 MG tablet Take 500 mg by mouth 2 (two) times daily.     [provider]  methocarbamol (ROBAXIN) 500 MG tablet Take 1 tablet (500 mg total) by mouth every 8 (eight) hours as needed for muscle spasms. 07/30/21   Persons, Bevely Palmer, PA  metoprolol succinate (TOPROL-XL) 25 MG 24 hr tablet Take 25 mg by mouth every evening.     [provider]  nitroGLYCERIN (NITROSTAT) 0.4 MG SL tablet Place 0.4 mg under the tongue every 5 (five) minutes as needed for chest pain.    [provider]  omeprazole ([place])  20 MG capsule Take 20 mg by mouth daily. 11/02/18   [provider]  pioglitazone (ACTOS) 30 MG tablet Take 30 mg by mouth every evening.     [provider]  temazepam (RESTORIL) 15 MG capsule Take 15 mg by mouth at bedtime as needed for sleep. 11/15/18   [provider]  valsartan (DIOVAN) 160 MG tablet Take 160 mg by mouth daily.  11/29/17   [provider]      Allergies    Penicillins, Tramadol, and Codeine    Review of Systems   Review of Systems  Constitutional:  Negative for  fever.   Physical Exam Updated Vital Signs BP (!) 151/53    Pulse (!) 57    Temp 97.9 F (36.6 C) (Oral)    Resp 17    Ht 1.753 m (5\' 9" )    Wt 73.5 kg    SpO2 97%    BMI 23.92 kg/m  Physical Exam Vitals and nursing note reviewed.  Constitutional:      General: She is not in acute distress.    Appearance: She is well-developed.  HENT:     Head: Normocephalic and atraumatic.     Right Ear: External ear normal.     Left Ear: External ear normal.  Eyes:     General: No scleral icterus.       Right eye: No discharge.        Left eye: No discharge.     Conjunctiva/sclera: Conjunctivae normal.  Neck:     Trachea: No tracheal deviation.  Cardiovascular:     Rate and Rhythm: Normal rate. Rhythm irregular.  Pulmonary:     Effort: Pulmonary effort is normal. No respiratory distress.     Breath sounds: Normal breath sounds. No stridor. No wheezing or rales.  Abdominal:     General: Bowel sounds are normal. There is no distension.     Palpations: Abdomen is soft.     Tenderness: There is no abdominal tenderness. There is no guarding or rebound.  Musculoskeletal:        General: No tenderness or deformity.     Cervical back: Neck supple.  Skin:    General: Skin is warm and dry.     Findings: No rash.  Neurological:     General: No focal deficit present.     Mental Status: She is alert.     Cranial Nerves: No cranial nerve deficit (no facial droop, extraocular movements intact, no slurred speech).     Sensory: No sensory deficit.     Motor: No abnormal muscle tone or seizure activity.     Coordination: Coordination normal.  Psychiatric:        Mood and Affect: Mood normal.    ED Results / Procedures / Treatments   Labs (all labs ordered are listed, but only abnormal results are displayed) Labs Reviewed  BASIC METABOLIC PANEL - Abnormal; Notable for the following components:      Result Value   CO2 19 (*)    Glucose, Bld 136 (*)    Creatinine, Ser 1.07 (*)    GFR, Estimated  51 (*)    All other components within normal limits  CBC - Abnormal; Notable for the following components:   Hemoglobin 11.8 (*)    RDW 15.7 (*)    All other components within normal limits  D-DIMER, QUANTITATIVE - Abnormal; Notable for the following components:   D-Dimer, Quant 0.94 (*)    All other components within  normal limits  CBG MONITORING, ED - Abnormal; Notable for the following components:   Glucose-Capillary 137 (*)    All other components within normal limits  TROPONIN I (HIGH SENSITIVITY)  TROPONIN I (HIGH SENSITIVITY)    EKG EKG Interpretation  Date/Time:  Sunday August 30 2021 13:16:24 EST Ventricular Rate:  63 PR Interval:  191 QRS Duration: 91 QT Interval:  446 QTC Calculation: 457 R Axis:   50 Text Interpretation: Sinus rhythm Atrial premature complexes in couplets Borderline T abnormalities, anterior leads No significant change since last tracing Confirmed by Dorie Rank [id number]) on 08/30/2021 1:20:14 PM  Radiology DG Chest Portable 1 View  Result Date: 08/30/2021 CLINICAL DATA:  Per nurse notes, "Pt bib [facility] from home with complaints of non-radiating centralized cp that started this am while sitting on her bed. Pt took two nitroglycerin tablets prior to ems arrival with no success. On ems arrival, pt was in NSR but pt had an episode of nausea and then was in afib at a rate of 48. Pt has been in afib since then ranging 40-130."Hx of asthma, CAD, diabetes, dyspnea, GERD, htn, lung cancer EXAM: PORTABLE CHEST 1 VIEW COMPARISON:  03/16/2021 and older exams.  CT, 07/08/2021. FINDINGS: Cardiac silhouette is mildly enlarged. No mediastinal or hilar masses. Heterogeneous bilateral interstitial thickening consistent with fibrosis and similar to the prior exam. No lung consolidation. No convincing pulmonary edema. No pleural effusion or pneumothorax. Skeletal structures are grossly intact. IMPRESSION: 1. No acute cardiopulmonary disease. 2. Findings consistent with pulmonary  fibrosis. Electronically Signed   By: Lajean Manes M.D.   On: 08/30/2021 14:01    Procedures Procedures    Medications Ordered in ED Medications  aspirin chewable tablet 324 mg (324 mg Oral Not Given 08/30/21 1333)  nitroGLYCERIN (NITROGLYN) 2 % ointment 1 inch (1 inch Topical Given 08/30/21 1348)    ED Course/ Medical Decision Making/ A&P Clinical Course as of 08/30/21 1510  Sun Aug 30, 2021  1326 Reviewed prior records.  Last nuclear medicine test noted to be in 2016.  Low risk at that time [JK]  1448 D-dimer, quantitative(!) Elevated [JK]  1449 Troponin I (High Sensitivity) Normal [JK]  1449 CBC(!) Normal [JK]  1449 DG Chest Portable 1 View Chest x-ray images reviewed pathology report reviewed.  Consistent with fibrosis but no acute findings [JK]  1510 Discussed case with Dr. Terrence Dupont regarding admission.  Recommends starting heparin.  He will come see the patient in the hospital [JK]    Clinical Course User Index [JK] Dorie Rank, MD   HEAR Score: 7                       Medical Decision Making Amount and/or Complexity of Data Reviewed Labs: ordered. Decision-making details documented in ED Course. Radiology: ordered. Decision-making details documented in ED Course.  Risk OTC drugs. Prescription drug management. Decision regarding hospitalization.  Chest pain Patient with history of coronary artery disease.  Prior stress test were reassuring but more several years ago.  Patient has a high risk heart score.  She comes in with recurrent chest pain.  Initial troponin is normal but will require delta troponin.  With her high risk heart score and her recurrent pain I will consult with her cardiologist for admission and further evaluation.  Elevated D-dimer. Slightly elevated.  Above age-adjusted cut off.  PE is a consideration for her chest pain.  We will proceed with CT angiogram.  Chronic kidney disease. Creatinine is slightly  elevated however she is can still safely get  a CT scan.  We will proceed with CT angiogram        Final Clinical Impression(s) / ED Diagnoses Final diagnoses:  Chest pain, unspecified type  Positive D dimer  Chronic kidney disease, unspecified CKD stage        Dorie Rank, MD 08/30/21 1511

## 2021-08-31 ENCOUNTER — Observation Stay (HOSPITAL_COMMUNITY): Payer: Medicare PPO

## 2021-08-31 DIAGNOSIS — I249 Acute ischemic heart disease, unspecified: Secondary | ICD-10-CM | POA: Diagnosis not present

## 2021-08-31 DIAGNOSIS — J45909 Unspecified asthma, uncomplicated: Secondary | ICD-10-CM | POA: Diagnosis not present

## 2021-08-31 DIAGNOSIS — E119 Type 2 diabetes mellitus without complications: Secondary | ICD-10-CM | POA: Diagnosis not present

## 2021-08-31 DIAGNOSIS — Z20822 Contact with and (suspected) exposure to covid-19: Secondary | ICD-10-CM | POA: Diagnosis not present

## 2021-08-31 LAB — BASIC METABOLIC PANEL
Anion gap: 9 (ref 5–15)
BUN: 19 mg/dL (ref 8–23)
CO2: 20 mmol/L — ABNORMAL LOW (ref 22–32)
Calcium: 8.6 mg/dL — ABNORMAL LOW (ref 8.9–10.3)
Chloride: 111 mmol/L (ref 98–111)
Creatinine, Ser: 1.2 mg/dL — ABNORMAL HIGH (ref 0.44–1.00)
GFR, Estimated: 45 mL/min — ABNORMAL LOW (ref >60)
Glucose, Bld: 143 mg/dL — ABNORMAL HIGH (ref 70–99)
Potassium: 3.9 mmol/L (ref 3.5–5.1)
Sodium: 140 mmol/L (ref 135–145)

## 2021-08-31 LAB — CBC
HCT: 32.2 % — ABNORMAL LOW (ref 36.0–46.0)
Hemoglobin: 10.6 g/dL — ABNORMAL LOW (ref 12.0–15.0)
MCH: 27.1 pg (ref 26.0–34.0)
MCHC: 32.9 g/dL (ref 30.0–36.0)
MCV: 82.4 fL (ref 80.0–100.0)
Platelets: 204 10*3/uL (ref 150–400)
RBC: 3.91 MIL/uL (ref 3.87–5.11)
RDW: 15.2 % (ref 11.5–15.5)
WBC: 5.1 10*3/uL (ref 4.0–10.5)
nRBC: 0 % (ref 0.0–0.2)

## 2021-08-31 LAB — LIPID PANEL
Cholesterol: 102 mg/dL (ref 0–200)
HDL: 43 mg/dL (ref >40)
LDL Cholesterol: 54 mg/dL (ref 0–99)
Total CHOL/HDL Ratio: 2.4 RATIO
Triglycerides: 23 mg/dL (ref <150)
VLDL: 5 mg/dL (ref 0–40)

## 2021-08-31 LAB — GLUCOSE, CAPILLARY
Glucose-Capillary: 124 mg/dL — ABNORMAL HIGH (ref 70–99)
Glucose-Capillary: 138 mg/dL — ABNORMAL HIGH (ref 70–99)
Glucose-Capillary: 123 mg/dL — ABNORMAL HIGH (ref 70–99)

## 2021-08-31 LAB — HEPARIN LEVEL (UNFRACTIONATED)
Heparin Unfractionated: 0.14 IU/mL — ABNORMAL LOW (ref 0.30–0.70)
Heparin Unfractionated: 0.71 IU/mL — ABNORMAL HIGH (ref 0.30–0.70)

## 2021-08-31 MED ORDER — REGADENOSON 0.4 MG/5ML IV SOLN
0.4000 mg | Freq: Once | INTRAVENOUS | Status: AC
  Administered 2021-08-31: 0.4 mg via INTRAVENOUS
  Filled 2021-08-31: qty 5

## 2021-08-31 MED ORDER — REGADENOSON 0.4 MG/5ML IV SOLN
INTRAVENOUS | Status: AC
  Filled 2021-08-31: qty 5

## 2021-08-31 MED ORDER — TECHNETIUM TC 99M TETROFOSMIN IV KIT
10.8000 | PACK | Freq: Once | INTRAVENOUS | Status: AC | PRN
  Administered 2021-08-31: 10.8 via INTRAVENOUS

## 2021-08-31 MED ORDER — TECHNETIUM TC 99M TETROFOSMIN IV KIT
30.2000 | PACK | Freq: Once | INTRAVENOUS | Status: AC | PRN
  Administered 2021-08-31: 30.2 via INTRAVENOUS

## 2021-08-31 NOTE — Progress Notes (Signed)
[place] for Heparin Indication: chest pain/ACS  Allergies  Allergen Reactions   Penicillins Rash and Other (See Comments)    PATIENT HAS HAD A PCN REACTION WITH IMMEDIATE RASH, FACIAL/TONGUE/THROAT SWELLING, SOB, OR LIGHTHEADEDNESS WITH HYPOTENSION:  #  #  #  YES  #  #  #  Has patient had a PCN reaction causing severe rash involving mucus membranes or skin necrosis: NO Has patient had a PCN reaction that required hospitalization NO Has patient had a PCN reaction occurring within the last 10 years: NO   Tramadol Itching   Codeine Rash    Patient Measurements: Height: 5\' 9"  (175.3 cm) Weight: 72.2 kg (159 lb 1.6 oz) IBW/kg (Calculated) : 66.2 Heparin Dosing Weight: 73.5 kg  Vital Signs: Temp: 97.9 F (36.6 C) (01/30 1343) Temp Source: Axillary (01/30 1343) BP: 167/59 (01/30 1343) Pulse Rate: 67 (01/30 1343)  Labs: Recent Labs    08/30/21 1344 08/30/21 1646 08/30/21 1921 08/31/21 0004 08/31/21 0333 08/31/21 1333  HGB 11.8*  --   --   --  10.6*  --   HCT 36.7  --   --   --  32.2*  --   PLT 239  --   --   --  204  --   LABPROT  --  13.9  --   --   --   --   INR  --  1.1  --   --   --   --   HEPARINUNFRC  --   --   --  0.14*  --  0.71*  CREATININE 1.07*  --   --   --  1.20*  --   TROPONINIHS 16 18* 20*  --   --   --      Estimated Creatinine Clearance: 36.5 mL/min (A) (by C-G formula based on SCr of 1.2 mg/dL (H)).   Medical History: Past Medical History:  Diagnosis Date   Anginal pain ([place])    Arthritis    Asthma    Coronary artery disease    Diabetes mellitus    Dyspnea    Early cataracts, bilateral    GERD (gastroesophageal reflux disease)    occ   Hypertension    Lung cancer (HCC)    Mass of upper lobe of left lung    mediastinal adenopathy   Palpitations    Pneumonia    Stroke [facility])    mini stroke   Wears dentures    Wears glasses     Medications:  Medications Prior to Admission  Medication Sig  Dispense Refill Last Dose   acetaminophen (TYLENOL) 500 MG tablet Take 1,000 mg by mouth every 6 (six) hours as needed for mild pain or headache.   08/29/2021   albuterol (VENTOLIN HFA) 108 (90 Base) MCG/ACT inhaler Inhale 2 puffs into the lungs every 6 (six) hours as needed for wheezing or shortness of breath.   unk   amLODipine (NORVASC) 10 MG tablet Take 10 mg by mouth daily.     08/30/2021   aspirin EC 81 MG tablet Take 81 mg by mouth daily.     08/30/2021   atorvastatin (LIPITOR) 20 MG tablet Take 20 mg by mouth daily at 6 PM.    08/30/2021   Cholecalciferol (VITAMIN D3) 125 MCG (5000 UT) TABS Take 5,000 Units by mouth daily.    08/30/2021   diclofenac Sodium (VOLTAREN) 1 % GEL Apply 4 g topically 4 (four) times daily as needed. (Patient taking  differently: Apply 4 g topically 4 (four) times daily as needed (pain).) 500 g 6 08/29/2021   gabapentin (NEURONTIN) 100 MG capsule Take 1 capsule (100 mg total) by mouth at bedtime. 30 capsule 5 08/29/2021   hydrALAZINE (APRESOLINE) 25 MG tablet Take 25 mg by mouth 2 (two) times daily.    08/30/2021   meclizine (ANTIVERT) 25 MG tablet Take 25 mg by mouth 2 (two) times daily as needed for dizziness.   Past Week   metFORMIN (GLUCOPHAGE) 500 MG tablet Take 500 mg by mouth 2 (two) times daily.    08/30/2021   metoprolol succinate (TOPROL-XL) 25 MG 24 hr tablet Take 25 mg by mouth every evening.    08/29/2021 at 2200   nitroGLYCERIN (NITROSTAT) 0.4 MG SL tablet Place 0.4 mg under the tongue every 5 (five) minutes as needed for chest pain.   08/30/2021   omeprazole (PRILOSEC) 20 MG capsule Take 20 mg by mouth daily.   08/30/2021   pioglitazone (ACTOS) 30 MG tablet Take 30 mg by mouth every evening.    08/29/2021   temazepam (RESTORIL) 15 MG capsule Take 15 mg by mouth at bedtime as needed for sleep.   unk   valsartan (DIOVAN) 160 MG tablet Take 160 mg by mouth daily.   5 08/30/2021   methocarbamol (ROBAXIN) 500 MG tablet Take 1 tablet (500 mg total) by mouth every 8  (eight) hours as needed for muscle spasms. 30 tablet 0    Scheduled:   amLODipine  5 mg Oral Daily   aspirin EC  81 mg Oral Daily   atorvastatin  80 mg Oral Daily   insulin aspart  0-9 Units Subcutaneous TID WC   metoprolol tartrate  12.5 mg Oral BID   nitroGLYCERIN  1 inch Topical Q6H   pantoprazole  40 mg Oral Q0600   regadenoson       Infusions:   sodium chloride 10 mL/hr at 08/31/21 0353   heparin 1,100 Units/hr (08/31/21 0353)    Assessment: 69 yoF admitted with CP. Pharmacy asked to start IV heparin. Stress test completed this morning, currently in process.  Heparin level just above goal at 0.71, confirmed it was drawn appropriately. CBC stable this am.  Goal of Therapy:  Heparin level 0.3-0.7 units/ml Monitor platelets by anticoagulation protocol: Yes   Plan:  Reduce heparin to 1050 units/h Recheck heparin level with am labs  Arrie Senate, PharmD, BCPS, [facility] Clinical Pharmacist [phone] Please check AMION for all [facility] numbers 08/31/2021

## 2021-08-31 NOTE — Care Management Obs Status (Signed)
[place] NOTIFICATION   Patient Details  Name: [name] [name] MRN: [id number] Date of Birth: 07/16/1935   Medicare Observation Status Notification Given:  Yes    Bethena Roys, RN 08/31/2021, 12:29 PM

## 2021-08-31 NOTE — Progress Notes (Signed)
[place] for Heparin Indication: chest pain/ACS Brief A/P: Heparin level subtherapeutic Rebolus, Increase Heparin rate  Allergies  Allergen Reactions   Penicillins Rash and Other (See Comments)    PATIENT HAS HAD A PCN REACTION WITH IMMEDIATE RASH, FACIAL/TONGUE/THROAT SWELLING, SOB, OR LIGHTHEADEDNESS WITH HYPOTENSION:  #  #  #  YES  #  #  #  Has patient had a PCN reaction causing severe rash involving mucus membranes or skin necrosis: NO Has patient had a PCN reaction that required hospitalization NO Has patient had a PCN reaction occurring within the last 10 years: NO   Tramadol Itching   Codeine Rash    Patient Measurements: Height: 5\' 9"  (175.3 cm) Weight: 72.2 kg (159 lb 1.6 oz) IBW/kg (Calculated) : 66.2 Heparin Dosing Weight: 73.5 kg  Vital Signs: Temp: 98.3 F (36.8 C) (01/29 2153) Temp Source: Oral (01/29 2153) BP: 160/54 (01/29 2153) Pulse Rate: 71 (01/29 2153)  Labs: Recent Labs    08/30/21 1344 08/30/21 1646 08/30/21 1921 08/31/21 0004  HGB 11.8*  --   --   --   HCT 36.7  --   --   --   PLT 239  --   --   --   LABPROT  --  13.9  --   --   INR  --  1.1  --   --   HEPARINUNFRC  --   --   --  0.14*  CREATININE 1.07*  --   --   --   TROPONINIHS 16 18* 20*  --      Estimated Creatinine Clearance: 40.9 mL/min (A) (by C-G formula based on SCr of 1.07 mg/dL (H)).  Assessment: 87 y.o. female with chest pain for heparin   Goal of Therapy:  Heparin level 0.3-0.7 units/ml Monitor platelets by anticoagulation protocol: Yes   Plan:  Increase Heparin 1100 units/hr Check heparin level in 8 hours.  Phillis Knack, PharmD, BCPS

## 2021-08-31 NOTE — Discharge Summary (Signed)
Physician Discharge Summary  Patient ID: LASHAWNE DURA MRN: [id number] DOB/AGE: 12/11/1934 87 y.o.  Admit date: 08/30/2021 Discharge date: 08/31/2021  Admission Diagnoses: Acute coronary syndrome Hypertension Diabetes mellitus Hyperlipidemia GERD History of CVA in the past History of carcinoma of lung, s/p left upper lobectomy H/O subarachnoid hemorrhage in the past Degenerative joint disease  Discharge Diagnoses:  Principal Problem:   Acute coronary syndrome (HCC) Active problems:  CAD  HTN  HLD  Type 2 DM  H/O CVA  H/O Ca Lung  S/P left upper lobe lobectomy  Degenerative joint disease  Discharged Condition: fair  Hospital Course: 87 years old black female with PMH of non-obstructive CAD, HTN, type 2 DM, HLD, GERD, H/O CA of lung, s/p left upper lobe lobectomy, h/o lacunar infarct and degenerative joint disease has retrosternal chest pain with nausea and diaphoresis and responding to SL NTG. Her Troponin I levels were near normal. She underwent nuclear stress test and had normal perfusion pre and post test with 65 % LV EF. I discussed her test result with daughter, [name] per patient request. She was discharged home in stable condition with f/u by me in 1 week and primary care in 1 month.  Consults: cardiology  Significant Diagnostic Studies: labs: Normal WBC and platelets counts. Hgb 10.6 gm and stable for over 2 years.   EKG NSR.  Chest X-ray: No cardiopulmonary disease. Chronic pulmonary fibrosis.  NM myocardial perfusion stress test : No reversible ischemia. LV EF 65 %.  CT angio chest: No PE.  Treatments: cardiac meds: Aspirin, Atorvastatin, metoprolol, amlodipine, SL NTG, Hydralazine and valsartan.  Discharge Exam: Blood pressure (!) 167/59, pulse 67, temperature 97.9 F (36.6 C), temperature source Axillary, resp. rate (!) 22, height 5\' 9"  (1.753 m), weight 72.2 kg, SpO2 99 %. General appearance: alert, cooperative and appears stated age. Head:  Normocephalic, atraumatic. Eyes: Brown eyes, pink conjunctiva, corneas clear.   Neck: No adenopathy, no carotid bruit, no JVD, supple, symmetrical, trachea midline and thyroid not enlarged. Resp: Clear to auscultation bilaterally. Cardio: Regular rate and rhythm, S1, S2 normal, II/VI systolic murmur, no click, rub or gallop. GI: Soft, non-tender; bowel sounds normal; no organomegaly. Extremities: No edema, cyanosis or clubbing. Skin: Warm and dry.  Neurologic: Alert and oriented X 3, normal strength and tone. Normal coordination and slow gait.  Disposition: Discharge disposition: 01-Home or Self Care        Allergies as of 08/31/2021       Reactions   Penicillins Rash, Other (See Comments)   PATIENT HAS HAD A PCN REACTION WITH IMMEDIATE RASH, FACIAL/TONGUE/THROAT SWELLING, SOB, OR LIGHTHEADEDNESS WITH HYPOTENSION:  #  #  #  YES  #  #  #  Has patient had a PCN reaction causing severe rash involving mucus membranes or skin necrosis: NO Has patient had a PCN reaction that required hospitalization NO Has patient had a PCN reaction occurring within the last 10 years: NO   Tramadol Itching   Codeine Rash        Medication List     TAKE these medications    acetaminophen 500 MG tablet Commonly known as: TYLENOL Take 1,000 mg by mouth every 6 (six) hours as needed for mild pain or headache.   albuterol 108 (90 Base) MCG/ACT inhaler Commonly known as: VENTOLIN HFA Inhale 2 puffs into the lungs every 6 (six) hours as needed for wheezing or shortness of breath.   amLODipine 10 MG tablet Commonly known as: NORVASC Take 10 mg by mouth  daily.   aspirin EC 81 MG tablet Take 81 mg by mouth daily.   atorvastatin 20 MG tablet Commonly known as: LIPITOR Take 20 mg by mouth daily at 6 PM.   diclofenac Sodium 1 % Gel Commonly known as: Voltaren Apply 4 g topically 4 (four) times daily as needed. What changed: reasons to take this   gabapentin 100 MG capsule Commonly known as:  Neurontin Take 1 capsule (100 mg total) by mouth at bedtime.   hydrALAZINE 25 MG tablet Commonly known as: APRESOLINE Take 25 mg by mouth 2 (two) times daily.   meclizine 25 MG tablet Commonly known as: ANTIVERT Take 25 mg by mouth 2 (two) times daily as needed for dizziness.   metFORMIN 500 MG tablet Commonly known as: GLUCOPHAGE Take 500 mg by mouth 2 (two) times daily.   methocarbamol 500 MG tablet Commonly known as: ROBAXIN Take 1 tablet (500 mg total) by mouth every 8 (eight) hours as needed for muscle spasms.   metoprolol succinate 25 MG 24 hr tablet Commonly known as: TOPROL-XL Take 25 mg by mouth every evening.   nitroGLYCERIN 0.4 MG SL tablet Commonly known as: NITROSTAT Place 0.4 mg under the tongue every 5 (five) minutes as needed for chest pain.   omeprazole 20 MG capsule Commonly known as: PRILOSEC Take 20 mg by mouth daily.   pioglitazone 30 MG tablet Commonly known as: ACTOS Take 30 mg by mouth every evening.   temazepam 15 MG capsule Commonly known as: RESTORIL Take 15 mg by mouth at bedtime as needed for sleep.   valsartan 160 MG tablet Commonly known as: DIOVAN Take 160 mg by mouth daily.   Vitamin D3 125 MCG (5000 UT) Tabs Take 5,000 Units by mouth daily.        Follow-up Information     Willey Blade, MD Follow up in 1 month(s).   Specialty: Internal Medicine Contact information: [address] [place] [address] [phone]         Dixie Dials, MD Follow up in 1 week(s).   Specialty: Cardiology Contact information: [place] [address] [phone]                 Time spent: Review of old chart, current chart, lab, x-ray, cardiac tests and discussion with patient over 60 minutes.  Signed: Birdie Riddle 08/31/2021, 4:32 PM

## 2021-09-01 ENCOUNTER — Encounter: Payer: Medicare PPO | Admitting: Physical Therapy

## 2021-09-08 ENCOUNTER — Encounter: Payer: Medicare PPO | Admitting: Physical Therapy

## 2021-09-09 ENCOUNTER — Ambulatory Visit
Admission: RE | Admit: 2021-09-09 | Discharge: 2021-09-09 | Disposition: A | Discharge status: Home / Self Care | Payer: Medicare PPO | Source: Ambulatory Visit | Attending: Neurology | Admitting: Neurology

## 2021-09-09 DIAGNOSIS — G44309 Post-traumatic headache, unspecified, not intractable: Secondary | ICD-10-CM

## 2021-09-15 ENCOUNTER — Encounter: Payer: Medicare PPO | Admitting: Physical Therapy

## 2021-09-21 ENCOUNTER — Other Ambulatory Visit: Payer: Self-pay

## 2021-09-21 ENCOUNTER — Ambulatory Visit: Payer: Medicare PPO | Admitting: Podiatry

## 2021-09-21 DIAGNOSIS — B351 Tinea unguium: Secondary | ICD-10-CM | POA: Diagnosis not present

## 2021-09-21 DIAGNOSIS — M79674 Pain in right toe(s): Secondary | ICD-10-CM | POA: Diagnosis not present

## 2021-09-21 DIAGNOSIS — I739 Peripheral vascular disease, unspecified: Secondary | ICD-10-CM

## 2021-09-21 DIAGNOSIS — M79675 Pain in left toe(s): Secondary | ICD-10-CM

## 2021-09-21 NOTE — Progress Notes (Signed)
Subjective:   Patient ID: [name] [name], female   DOB: 87 y.o.   MRN: [id number]   HPI 87 year old female presents the office today for concerns of thick, discolored toenails that she has difficulty trimming herself.  She previously had a right big toenail removed.  She denies any swelling or redness or any drainage to the toenail sites.  No open sores.  Last A1c was 6.1 on 08/30/2021  Willey Blade, MD   Review of Systems  All other systems reviewed and are negative.  Past Medical History:  Diagnosis Date   Anginal pain ([place])    Arthritis    Asthma    Coronary artery disease    Diabetes mellitus    Dyspnea    Early cataracts, bilateral    GERD (gastroesophageal reflux disease)    occ   Hypertension    Lung cancer ([place])    Mass of upper lobe of left lung    mediastinal adenopathy   Palpitations    Pneumonia    Stroke ([place])    mini stroke   Wears dentures    Wears glasses     Past Surgical History:  Procedure Laterality Date   ABDOMINAL HYSTERECTOMY     ANKLE SURGERY Left    fusion   COLONOSCOPY W/ BIOPSIES AND POLYPECTOMY     IR ANGIO EXTRACRAN SEL COM CAROTID INNOMINATE UNI BILAT MOD SED  11/30/2018   IR ANGIO VERTEBRAL SEL VERTEBRAL BILAT MOD SED  11/30/2018   IR ANGIOGRAM SELECTIVE EACH ADDITIONAL VESSEL  11/30/2018   MULTIPLE TOOTH EXTRACTIONS     TOTAL KNEE ARTHROPLASTY Right    VIDEO ASSISTED THORACOSCOPY (VATS)/ LOBECTOMY Left 04/21/2017   Procedure: VIDEO ASSISTED THORACOSCOPY (VATS)/LEFT UPPER LOBECTOMY;  Surgeon: Melrose Nakayama, MD;  Location: [facility];  Service: Thoracic;  Laterality: Left;   VIDEO BRONCHOSCOPY WITH ENDOBRONCHIAL NAVIGATION N/A 01/21/2017   Procedure: VIDEO BRONCHOSCOPY WITH ENDOBRONCHIAL NAVIGATION;  Surgeon: Melrose Nakayama, MD;  Location: [facility];  Service: Thoracic;  Laterality: N/A;   VIDEO BRONCHOSCOPY WITH ENDOBRONCHIAL ULTRASOUND N/A 01/21/2017   Procedure: VIDEO BRONCHOSCOPY WITH ENDOBRONCHIAL ULTRASOUND;  Surgeon:  Melrose Nakayama, MD;  [facility];  Service: Thoracic;  Laterality: N/A;     Current Outpatient Medications:    acetaminophen (TYLENOL) 500 MG tablet, Take 1,000 mg by mouth every 6 (six) hours as needed for mild pain or headache., Disp: , Rfl:    albuterol (VENTOLIN HFA) 108 (90 Base) MCG/ACT inhaler, Inhale 2 puffs into the lungs every 6 (six) hours as needed for wheezing or shortness of breath., Disp: , Rfl:    amLODipine (NORVASC) 10 MG tablet, Take 10 mg by mouth daily.  , Disp: , Rfl:    aspirin EC 81 MG tablet, Take 81 mg by mouth daily.  , Disp: , Rfl:    atorvastatin (LIPITOR) 20 MG tablet, Take 20 mg by mouth daily at 6 PM. , Disp: , Rfl:    Cholecalciferol (VITAMIN D3) 125 MCG (5000 UT) TABS, Take 5,000 Units by mouth daily. , Disp: , Rfl:    diclofenac Sodium (VOLTAREN) 1 % GEL, Apply 4 g topically 4 (four) times daily as needed. (Patient taking differently: Apply 4 g topically 4 (four) times daily as needed (pain).), Disp: 500 g, Rfl: 6   gabapentin (NEURONTIN) 100 MG capsule, Take 1 capsule (100 mg total) by mouth at bedtime., Disp: 30 capsule, Rfl: 5   hydrALAZINE (APRESOLINE) 25 MG tablet, Take 25 mg by mouth 2 (two) times  daily. , Disp: , Rfl:    meclizine (ANTIVERT) 25 MG tablet, Take 25 mg by mouth 2 (two) times daily as needed for dizziness., Disp: , Rfl:    metFORMIN (GLUCOPHAGE) 500 MG tablet, Take 500 mg by mouth 2 (two) times daily. , Disp: , Rfl:    methocarbamol (ROBAXIN) 500 MG tablet, Take 1 tablet (500 mg total) by mouth every 8 (eight) hours as needed for muscle spasms., Disp: 30 tablet, Rfl: 0   metoprolol succinate (TOPROL-XL) 25 MG 24 hr tablet, Take 25 mg by mouth every evening. , Disp: , Rfl:    nitroGLYCERIN (NITROSTAT) 0.4 MG SL tablet, Place 0.4 mg under the tongue every 5 (five) minutes as needed for chest pain., Disp: , Rfl:    omeprazole (PRILOSEC) 20 MG capsule, Take 20 mg by mouth daily., Disp: , Rfl:    pioglitazone (ACTOS) 30 MG tablet, Take  30 mg by mouth every evening. , Disp: , Rfl:    temazepam (RESTORIL) 15 MG capsule, Take 15 mg by mouth at bedtime as needed for sleep., Disp: , Rfl:    valsartan (DIOVAN) 160 MG tablet, Take 160 mg by mouth daily. , Disp: , Rfl: 5  Allergies  Allergen Reactions   Penicillins Rash and Other (See Comments)    PATIENT HAS HAD A PCN REACTION WITH IMMEDIATE RASH, FACIAL/TONGUE/THROAT SWELLING, SOB, OR LIGHTHEADEDNESS WITH HYPOTENSION:  #  #  #  YES  #  #  #  Has patient had a PCN reaction causing severe rash involving mucus membranes or skin necrosis: NO Has patient had a PCN reaction that required hospitalization NO Has patient had a PCN reaction occurring within the last 10 years: NO   Tramadol Itching   Codeine Rash          Objective:  Physical Exam  General: AAO x3, NAD  Dermatological: Nails are hypertrophic, dystrophic, brittle, discolored, elongated 10. No surrounding redness or drainage. Tenderness nails 1-5 bilaterally. No open lesions or pre-ulcerative lesions are identified today.  Vascular: Dorsalis Pedis artery and Posterior Tibial artery pedal pulses are 1/4 bilateral with immedate capillary fill time.  There is no pain with calf compression, swelling, warmth, erythema.   Neruologic: Sensation mildly decreased with Semmes Weinstein monofilament.  Musculoskeletal: No other areas of discomfort noted.     Assessment:   Symptomatic onychomycosis     Plan:  -Treatment options discussed including all alternatives, risks, and complications -Etiology of symptoms were discussed -Nails debrided 10 without complications or bleeding.  Discussed treatment options and she is going to start with over-the-counter topical Fungi-Nail. -ABI ordered -Daily foot inspection -Follow-up in 3 months or sooner if any problems arise. In the meantime, encouraged to call the office with any questions, concerns, change in symptoms.   Celesta Gentile, DPM

## 2021-09-21 NOTE — Patient Instructions (Signed)
You can use fungi-nail on the toenails

## 2021-09-22 ENCOUNTER — Encounter: Payer: Medicare PPO | Admitting: Physical Therapy

## 2021-09-24 ENCOUNTER — Ambulatory Visit (HOSPITAL_COMMUNITY)
Admission: RE | Admit: 2021-09-24 | Discharge: 2021-09-24 | Disposition: A | Discharge status: Home / Self Care | Payer: Medicare PPO | Source: Ambulatory Visit | Attending: Podiatry | Admitting: Podiatry

## 2021-09-24 ENCOUNTER — Other Ambulatory Visit: Payer: Self-pay

## 2021-09-24 DIAGNOSIS — I739 Peripheral vascular disease, unspecified: Secondary | ICD-10-CM | POA: Diagnosis present

## 2021-09-29 ENCOUNTER — Encounter: Payer: Self-pay | Admitting: Physical Therapy

## 2021-09-29 ENCOUNTER — Other Ambulatory Visit: Payer: Self-pay

## 2021-09-29 ENCOUNTER — Ambulatory Visit: Payer: Medicare PPO | Admitting: Physical Therapy

## 2021-09-29 DIAGNOSIS — R293 Abnormal posture: Secondary | ICD-10-CM

## 2021-09-29 DIAGNOSIS — M25511 Pain in right shoulder: Secondary | ICD-10-CM

## 2021-09-29 DIAGNOSIS — M6281 Muscle weakness (generalized): Secondary | ICD-10-CM | POA: Diagnosis not present

## 2021-09-29 DIAGNOSIS — M542 Cervicalgia: Secondary | ICD-10-CM | POA: Diagnosis not present

## 2021-09-29 DIAGNOSIS — R2689 Other abnormalities of gait and mobility: Secondary | ICD-10-CM | POA: Diagnosis not present

## 2021-09-29 DIAGNOSIS — G8929 Other chronic pain: Secondary | ICD-10-CM

## 2021-09-29 NOTE — Therapy (Signed)
[facility] [address] [place], [place], [address] Phone: [phone]   Fax:  [phone]  Physical Therapy Treatment/Recert  Patient Details  Name: [name] [name] MRN: [id number] Date of Birth: [birth year] Referring Provider (PT): Dr. Durward Fortes   Encounter Date: 09/29/2021   PT End of Session - 09/29/21 0947     Visit Number 3    Number of Visits 12    Date for PT Re-Evaluation 11/24/21    Authorization Type [employer] MCR 12 visits until 3/16    Authorization - Visit Number 3    Authorization - Number of Visits 12   authorization submitted 1/16   Progress Note Due on Visit 10    PT Start Time 0935    PT Stop Time 1020   last 7 min on heat   PT Time Calculation (min) 45 min    Activity Tolerance Patient tolerated treatment well    Behavior During Therapy WFL for tasks assessed/performed             Past Medical History:  Diagnosis Date   Anginal pain ([place])    Arthritis    Asthma    Coronary artery disease    Diabetes mellitus    Dyspnea    Early cataracts, bilateral    GERD (gastroesophageal reflux disease)    occ   Hypertension    Lung cancer ([place])    Mass of upper lobe of left lung    mediastinal adenopathy   Palpitations    Pneumonia    Stroke ([place])    mini stroke   Wears dentures    Wears glasses     Past Surgical History:  Procedure Laterality Date   ABDOMINAL HYSTERECTOMY     ANKLE SURGERY Left    fusion   COLONOSCOPY W/ BIOPSIES AND POLYPECTOMY     IR ANGIO EXTRACRAN SEL COM CAROTID INNOMINATE UNI BILAT MOD SED  11/30/2018   IR ANGIO VERTEBRAL SEL VERTEBRAL BILAT MOD SED  11/30/2018   IR ANGIOGRAM SELECTIVE EACH ADDITIONAL VESSEL  11/30/2018   MULTIPLE TOOTH EXTRACTIONS     TOTAL KNEE ARTHROPLASTY Right    VIDEO ASSISTED THORACOSCOPY (VATS)/ LOBECTOMY Left 04/21/2017   Procedure: VIDEO ASSISTED THORACOSCOPY (VATS)/LEFT UPPER LOBECTOMY;  Surgeon: Melrose Nakayama, MD;  Location: [facility];  Service: Thoracic;   Laterality: Left;   VIDEO BRONCHOSCOPY WITH ENDOBRONCHIAL NAVIGATION N/A 01/21/2017   Procedure: VIDEO BRONCHOSCOPY WITH ENDOBRONCHIAL NAVIGATION;  Surgeon: Melrose Nakayama, MD;  Location: [facility];  Service: Thoracic;  Laterality: N/A;   VIDEO BRONCHOSCOPY WITH ENDOBRONCHIAL ULTRASOUND N/A 01/21/2017   Procedure: VIDEO BRONCHOSCOPY WITH ENDOBRONCHIAL ULTRASOUND;  Surgeon: Melrose Nakayama, MD;  [facility];  Service: Thoracic;  Laterality: N/A;    There were no vitals filed for this visit.   Subjective Assessment - 09/29/21 0950     Subjective she relays she had to stop PT because she had to go the ER in late january due to chest pains and then she was recommended to get cleared by MD before returning to PT. She has been checked out by her cardiologist who has now cleared her for PT. She says her Rt shoulder is doing much better but her neck pain is her biggest complaint with 8/10 "aggravating pain". She also complains of bilat leg pain and stiffness first thing in the morning.    Diagnostic tests Imaging: Rt shoulder XR negative for acute findings but OA noted. Neck XR no acute fractures but advanced DDD and facet  arthropathy, loss of normal cervical curve.    Patient Stated Goals reduce pain    Pain Onset More than a month ago                [facility] PT Assessment - 09/29/21 0001       Assessment   Medical Diagnosis Neck pain, recent fall onto Rt shoulder    Referring Provider (PT) Dr. Durward Fortes      Observation/Other Assessments   Focus on Therapeutic Outcomes (FOTO)  38% functional, goal 60%      AROM   Right Shoulder Flexion 90 Degrees    Right Shoulder ABduction 100 Degrees    Cervical Flexion 30    Cervical Extension 20    Cervical - Right Side Bend 15    Cervical - Left Side Bend 10    Cervical - Right Rotation 30    Cervical - Left Rotation 30              [facility] Adult PT Treatment/Exercise - 09/29/21 0001       Exercises   Other Exercises  cervical  retractions X10, cervical extension SNAG with strap 5 sec x10, cervical rotation with self overpressure 5 sec X10 bilat, upper trap stretch 5 sec X10 bilat, seated bilat ER with red X20, seated rows with red X20, seated hamstring stretch 30 sec X2 bilat, seated LAQ X 2 min bilat      Moist Heat Therapy   Number Minutes Moist Heat 7 Minutes    Moist Heat Location Cervical      Manual Therapy   Manual therapy comments STM and cupping to bilat upper traps                       PT Short Term Goals - 09/29/21 0956       PT SHORT TERM GOAL #1   Title Patient will demonstrate independent use of home exercise program to maintain progress from in clinic treatments.    Baseline revised today    Time 4    Period Weeks    Status On-going    Target Date 10/27/21      PT SHORT TERM GOAL #2   Title Pt will reduce pain 25% overall    Baseline met for shoulder but not for neck which is still 8/10    Time 4    Period Weeks    Status On-going    Target Date 10/27/21               PT Long Term Goals - 09/29/21 0957       PT LONG TERM GOAL #1   Title Pt wil reduce pain overall 50% to less than 4/10 with usual activity    Baseline improved pain in shoulder but not for neck 8/10 pain    Time 6    Period Weeks    Status On-going    Target Date 11/24/21      PT LONG TERM GOAL #2   Title Pt will improve FOTO to 60% functional    Time 6    Period Weeks    Status On-going    Target Date 11/24/21      PT LONG TERM GOAL #3   Title Patient will demonstrate cervical AROM and Rt shoulder AROM WFL to facilitate daily activity including driving, self care at PLOF s limitation due to symptoms.    Baseline moderate limitations due to pain and stiffness  Time 6    Period Weeks    Status On-going    Target Date 11/24/21      PT LONG TERM GOAL #4   Title Pt will improve Rt shoulder strength to 4/5 MMT grossly for functional strength    Baseline 3/5 for abd and ER    Time 6     Period Weeks    Status New      PT LONG TERM GOAL #5   Title Pt will perform at least 40/56 points on berg balance test to show improved balance.    Baseline 37    Time 6    Period Weeks    Status On-going    Target Date 11/24/21                   Plan - 09/29/21 0953     Clinical Impression Statement Recert/Reassessment today as her POC date was up. She had only come 2 visits and then had to discontinue PT due to cardiac issues. She has since been cleared to return to PT. We reviewed and updated her HEP to add in some leg stretches and exercises at her request as she is having pain first thing in the morning. I also focused more on neck exercises vs shoulder as this is now her biggest complaint. She has improved her ROM some in neck and shoulder however progress has been very slow due to her having to hold PT for last 30 days and that this is a chronic issue. PT recommending 4-8 more weeeks of PT to address her continued deficits in neck/shoulder strength and ROM.    Personal Factors and Comorbidities Comorbidity 3+    Comorbidities HTN, DM, history of cancer (lung), CVA, GERD    Examination-Activity Limitations Bed Mobility;Bend;Carry;Lift;Stand;Stairs;Squat;Sleep;Reach Overhead;Locomotion Level;Transfers    Examination-Participation Restrictions Cleaning;Community Activity;Driving;Yard Work;Laundry;Shop    Stability/Clinical Decision Making Evolving/Moderate complexity    Rehab Potential Good    PT Frequency 2x / week   1-2   PT Duration 8 weeks    PT Treatment/Interventions ADLs/Self Care Home Management;Cryotherapy;Electrical Stimulation;Moist Heat;Traction;Ultrasound;Therapeutic activities;Therapeutic exercise;Neuromuscular re-education;Manual techniques;Passive range of motion;Dry needling;Joint Manipulations;Vasopneumatic Device;Taping    PT Next Visit Plan how is HEP going?    PT Home Exercise Plan Access Code: [email or web address]    Consulted and Agree with Plan of Care Patient              Patient will benefit from skilled therapeutic intervention in order to improve the following deficits and impairments:  Decreased activity tolerance, Decreased balance, Decreased endurance, Decreased mobility, Decreased range of motion, Decreased strength, Difficulty walking, Postural dysfunction, Impaired flexibility, Pain  Visit Diagnosis: Cervicalgia  Chronic right shoulder pain  Muscle weakness (generalized)  Other abnormalities of gait and mobility  Abnormal posture     Problem List Patient Active Problem List   Diagnosis Date Noted   Acute coronary syndrome ([place]) 08/30/2021   Pain in right shoulder 08/27/2021   Cervical spine arthritis 08/27/2021   Allergic rhinitis 12/07/2019   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   Hyperlipidemia 12/07/2019   Hypertensive heart and renal disease 12/07/2019   Overweight 12/07/2019   Smoker 12/07/2019   Vitamin D deficiency 12/07/2019   Hypertensive emergency 12/02/2018   Subarachnoid hemorrhage ([place]) 11/29/2018   S/P lobectomy of lung 04/21/2017   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 ([place]) 02/16/2017   Solitary pulmonary nodule 02/08/2017   Shortness of breath 12/29/2016    Class: Acute   Gastroesophageal reflux  disease 03/04/2015   Peripheral vascular disease, unspecified ([place]) 07/12/2013   Atherosclerosis of native arteries of the extremities with intermittent claudication 07/12/2013   Chest pain, cardiac 06/08/2011    Class: Acute   Diabetes mellitus ([place]) 06/08/2011    Class: History of   HTN (hypertension), benign 06/08/2011    Class: Chronic   Obesity (BMI 30-39.9) 06/08/2011    Class: History of   Coronary artery disease 06/08/2011    Class: History of    Debbe Odea, PT,DPT 09/29/2021, 10:38 AM  [facility] [address] [place], [place], [address] Phone: [phone]   Fax:  [phone]  Name: [name] [name] MRN: [id number] Date  of Birth: 1934-12-11

## 2021-09-30 ENCOUNTER — Ambulatory Visit: Payer: Medicare PPO | Admitting: Orthopaedic Surgery

## 2021-09-30 ENCOUNTER — Encounter: Payer: Self-pay | Admitting: Orthopaedic Surgery

## 2021-09-30 DIAGNOSIS — M47812 Spondylosis without myelopathy or radiculopathy, cervical region: Secondary | ICD-10-CM | POA: Diagnosis not present

## 2021-09-30 NOTE — Progress Notes (Addendum)
? ?Office Visit Note ?  ?Patient: [name] [name]           ?Date of Birth: 1934-09-04           ?MRN: [id number] ?Visit Date: 09/30/2021 ?             ?Requested by: Willey Blade, MD ?[address] ?[address] ?[place],  [place] [address] ?PCP: Willey Blade, MD ? ? ?Assessment & Plan: ?Visit Diagnoses: neck pain ? ?Plan: [name] [name] is here today to follow-up on her right shoulder pain and her neck pain.  She was given a injection into the right shoulder and she said this is helped her tremendously.  She still has some trouble with her neck and had her therapy disrupted because of the hospital stay for chest pain and irregular heartbeat.  She just recently was released from the hospital and has done 1 therapy appointment and has more scheduled.  She denies any paresthesias or weakness in her upper extremities ? ?Follow-Up Instructions: No follow-ups on file.  ? ?Orders:  ?No orders of the defined types were placed in this encounter. ? ?No orders of the defined types were placed in this encounter. ? ? ? ? Procedures: ?No procedures performed ? ? ?Clinical Data: ?No additional findings. ? ? ?Subjective: ?Chief Complaint  ?Patient presents with  ? Right Shoulder - Follow-up  ? Neck - Follow-up  ?Patient presents today for a one month follow up on her right shoulder and neck pain. She is going to physical therapy once weekly. She states that she missed some therapy due to a recent stay at the hospital for chest pain. She said that her neck is the biggest concern, but even that seems to be improving some. Her shoulder is much better since receiving the cortisone injection.  ? ? ? ?Review of Systems  ?All other systems reviewed and are negative. ? ? ?Objective: ?Vital Signs: There were no vitals taken for this visit. ? ?Physical Exam ?Constitutional:   ?   Appearance: Normal appearance.  ?Pulmonary:  ?   Effort: Pulmonary effort is normal.  ?Skin: ?   General: Skin is warm and dry.  ?Neurological:  ?   Mental  Status: She is alert.  ? ? ?Ortho Exam ?Examination of her right shoulder she has forward elevation to 170 degrees she can internally rotate behind her back to about her belt line.  She has good resisted abduction.  Good resisted external and internal rotation good grip strength.  She has no step-off or abnormalities to palpation of her cervical spine is more tender globally bilaterally in the paravertebral musculature.  She has flexion not quite to her chest and limited extension.  She has good upper body strength with resisted extension and flexion ?Specialty Comments:  ?No specialty comments available. ? ?Imaging: ?No results found. ? ? ?PMFS History: ?Patient Active Problem List  ? Diagnosis Date Noted  ? Acute coronary syndrome ([place]) 08/30/2021  ? Pain in right shoulder 08/27/2021  ? Cervical spine arthritis 08/27/2021  ? Allergic rhinitis 12/07/2019  ? 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 Hyperlipidemia 12/07/2019  ? 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 Overweight 12/07/2019  ? Smoker 12/07/2019  ? Vitamin D deficiency 12/07/2019  ? Hypertensive emergency 12/02/2018  ? Subarachnoid hemorrhage ([place]) 11/29/2018  ? S/P lobectomy of lung 04/21/2017  ? 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 ([place]) 02/16/2017  ? Solitary pulmonary nodule 02/08/2017  ? Shortness of breath 12/29/2016  ?  Class: Acute  ? Gastroesophageal reflux disease 03/04/2015  ? Peripheral vascular disease, unspecified ([place]) 07/12/2013  ? Atherosclerosis of native arteries of the extremities with intermittent claudication 07/12/2013  ? Chest pain, cardiac 06/08/2011  ?  Class: Acute  ? Diabetes mellitus ([place]) 06/08/2011  ?  Class: History of  ? HTN (hypertension), benign 06/08/2011  ?  Class: Chronic  ? Obesity (BMI 30-39.9) 06/08/2011  ?  Class: History of  ? Coronary artery disease 06/08/2011  ?  Class: History of  ? ?Past Medical History:  ?Diagnosis Date  ? Anginal pain ([place])   ? Arthritis   ? Asthma   ?  Coronary artery disease   ? Diabetes mellitus   ? Dyspnea   ? Early cataracts, bilateral   ? GERD (gastroesophageal reflux disease)   ? occ  ? Hypertension   ? Lung cancer ([place])   ? Mass of upper lobe of left lung   ? mediastinal adenopathy  ? Palpitations   ? Pneumonia   ? Stroke [facility])   ? mini stroke  ? Wears dentures   ? Wears glasses   ?  ?Family History  ?Problem Relation Age of Onset  ? Diabetes Mother   ? Diabetes Sister   ? Hyperlipidemia Sister   ? Diabetes Brother   ? Diabetes Son   ? Cancer Neg Hx   ?  ?Past Surgical History:  ?Procedure Laterality Date  ? ABDOMINAL HYSTERECTOMY    ? ANKLE SURGERY Left   ? fusion  ? COLONOSCOPY W/ BIOPSIES AND POLYPECTOMY    ? IR ANGIO EXTRACRAN SEL COM CAROTID INNOMINATE UNI BILAT MOD SED  11/30/2018  ? IR ANGIO VERTEBRAL SEL VERTEBRAL BILAT MOD SED  11/30/2018  ? IR ANGIOGRAM SELECTIVE EACH ADDITIONAL VESSEL  11/30/2018  ? MULTIPLE TOOTH EXTRACTIONS    ? TOTAL KNEE ARTHROPLASTY Right   ? VIDEO ASSISTED THORACOSCOPY (VATS)/ LOBECTOMY Left 04/21/2017  ? Procedure: VIDEO ASSISTED THORACOSCOPY (VATS)/LEFT UPPER LOBECTOMY;  Surgeon: Melrose Nakayama, MD;  Location: [facility];  Service: Thoracic;  Laterality: Left;  ? VIDEO BRONCHOSCOPY WITH ENDOBRONCHIAL NAVIGATION N/A 01/21/2017  ? Procedure: VIDEO BRONCHOSCOPY WITH ENDOBRONCHIAL NAVIGATION;  Surgeon: Melrose Nakayama, MD;  Location: [facility];  Service: Thoracic;  Laterality: N/A;  ? VIDEO BRONCHOSCOPY WITH ENDOBRONCHIAL ULTRASOUND N/A 01/21/2017  ? Procedure: VIDEO BRONCHOSCOPY WITH ENDOBRONCHIAL ULTRASOUND;  Surgeon: Melrose Nakayama, MD;  Location: [facility];  Service: Thoracic;  Laterality: N/A;  ? ?Social History  ? ?Occupational History  ? Not on file  ?Tobacco Use  ? Smoking status: Former  ?  Packs/day: 0.25  ?  Years: 12.00  ?  Pack years: 3.00  ?  Types: Cigarettes  ?  Quit date: 03/2017  ?  Years since quitting: 4.5  ? Smokeless tobacco: Never  ?Vaping Use  ? Vaping Use: Never used  ?Substance and Sexual Activity   ? Alcohol use: No  ? Drug use: No  ? Sexual activity: Not Currently  ? ? ? ? ? ? ?

## 2021-10-08 ENCOUNTER — Other Ambulatory Visit: Payer: Self-pay

## 2021-10-08 ENCOUNTER — Ambulatory Visit: Payer: Medicare PPO | Admitting: Physical Therapy

## 2021-10-08 ENCOUNTER — Encounter: Payer: Self-pay | Admitting: Physical Therapy

## 2021-10-08 DIAGNOSIS — R2689 Other abnormalities of gait and mobility: Secondary | ICD-10-CM | POA: Diagnosis not present

## 2021-10-08 DIAGNOSIS — M6281 Muscle weakness (generalized): Secondary | ICD-10-CM

## 2021-10-08 DIAGNOSIS — R293 Abnormal posture: Secondary | ICD-10-CM

## 2021-10-08 DIAGNOSIS — M542 Cervicalgia: Secondary | ICD-10-CM | POA: Diagnosis not present

## 2021-10-08 DIAGNOSIS — M25511 Pain in right shoulder: Secondary | ICD-10-CM

## 2021-10-08 DIAGNOSIS — G8929 Other chronic pain: Secondary | ICD-10-CM

## 2021-10-08 NOTE — Therapy (Signed)
[facility] [address] [place], [place], [address] Phone: [phone]   Fax:  [phone]  Physical Therapy Treatment  Patient Details  Name: [name] [name] MRN: [id number] Date of Birth: [birth year] Referring Provider (PT): Dr. Durward Fortes   Encounter Date: 10/08/2021   PT End of Session - 10/08/21 1025     Visit Number 4    Number of Visits 12    Date for PT Re-Evaluation 11/24/21    Authorization Type [employer] MCR 12 visits until 3/16    Authorization - Visit Number 4    Authorization - Number of Visits 12   authorization submitted 1/16   Progress Note Due on Visit 10    PT Start Time 5449    PT Stop Time 1104    PT Time Calculation (min) 41 min    Activity Tolerance Patient tolerated treatment well    Behavior During Therapy WFL for tasks assessed/performed             Past Medical History:  Diagnosis Date   Anginal pain ([place])    Arthritis    Asthma    Coronary artery disease    Diabetes mellitus    Dyspnea    Early cataracts, bilateral    GERD (gastroesophageal reflux disease)    occ   Hypertension    Lung cancer ([place])    Mass of upper lobe of left lung    mediastinal adenopathy   Palpitations    Pneumonia    Stroke ([place])    mini stroke   Wears dentures    Wears glasses     Past Surgical History:  Procedure Laterality Date   ABDOMINAL HYSTERECTOMY     ANKLE SURGERY Left    fusion   COLONOSCOPY W/ BIOPSIES AND POLYPECTOMY     IR ANGIO EXTRACRAN SEL COM CAROTID INNOMINATE UNI BILAT MOD SED  11/30/2018   IR ANGIO VERTEBRAL SEL VERTEBRAL BILAT MOD SED  11/30/2018   IR ANGIOGRAM SELECTIVE EACH ADDITIONAL VESSEL  11/30/2018   MULTIPLE TOOTH EXTRACTIONS     TOTAL KNEE ARTHROPLASTY Right    VIDEO ASSISTED THORACOSCOPY (VATS)/ LOBECTOMY Left 04/21/2017   Procedure: VIDEO ASSISTED THORACOSCOPY (VATS)/LEFT UPPER LOBECTOMY;  Surgeon: Melrose Nakayama, MD;  Location: [facility];  Service: Thoracic;  Laterality: Left;   VIDEO  BRONCHOSCOPY WITH ENDOBRONCHIAL NAVIGATION N/A 01/21/2017   Procedure: VIDEO BRONCHOSCOPY WITH ENDOBRONCHIAL NAVIGATION;  Surgeon: Melrose Nakayama, MD;  Location: [place];  Service: Thoracic;  Laterality: N/A;   VIDEO BRONCHOSCOPY WITH ENDOBRONCHIAL ULTRASOUND N/A 01/21/2017   Procedure: VIDEO BRONCHOSCOPY WITH ENDOBRONCHIAL ULTRASOUND;  Surgeon: Melrose Nakayama, MD;  Location[facility];  Service: Thoracic;  Laterality: N/A;    There were no vitals filed for this visit.   Subjective Assessment - 10/08/21 1026     Subjective shoulder is doing pretty well afer injection, neck pain still bothering her    Diagnostic tests Imaging: Rt shoulder XR negative for acute findings but OA noted. Neck XR no acute fractures but advanced DDD and facet arthropathy, loss of normal cervical curve.    Patient Stated Goals reduce pain    Currently in Pain? Yes    Pain Score 7    "it's not that bad"   Pain Location Neck    Pain Orientation Right;Left    Pain Descriptors / Indicators Constant;Other (Comment)   "it's just a pain"   Pain Type Chronic pain    Pain Onset More than a month ago  Pain Frequency Constant    Aggravating Factors  sleeping    Pain Relieving Factors tylenol, arthritis                               [facility] Adult PT Treatment/Exercise - 10/08/21 1028       Exercises   Exercises Neck;Knee/Hip      Neck Exercises: Seated   Neck Retraction 10 reps;5 secs    Cervical Rotation Both;10 reps   with overpressure x 5 min   Other Seated Exercise extension SNAG 10 x 5 sec      Neck Exercises: Stretches   Upper Trapezius Stretch Right;Left;3 reps;30 seconds      Knee/Hip Exercises: Stretches   Passive Hamstring Stretch Both;3 reps;30 seconds      Knee/Hip Exercises: Seated   Long Arc Quad Both;10 reps                       PT Short Term Goals - 09/29/21 0956       PT SHORT TERM GOAL #1   Title Patient will demonstrate independent use of home  exercise program to maintain progress from in clinic treatments.    Baseline revised today    Time 4    Period Weeks    Status On-going    Target Date 10/27/21      PT SHORT TERM GOAL #2   Title Pt will reduce pain 25% overall    Baseline met for shoulder but not for neck which is still 8/10    Time 4    Period Weeks    Status On-going    Target Date 10/27/21               PT Long Term Goals - 09/29/21 0957       PT LONG TERM GOAL #1   Title Pt wil reduce pain overall 50% to less than 4/10 with usual activity    Baseline improved pain in shoulder but not for neck 8/10 pain    Time 6    Period Weeks    Status On-going    Target Date 11/24/21      PT LONG TERM GOAL #2   Title Pt will improve FOTO to 60% functional    Time 6    Period Weeks    Status On-going    Target Date 11/24/21      PT LONG TERM GOAL #3   Title Patient will demonstrate cervical AROM and Rt shoulder AROM WFL to facilitate daily activity including driving, self care at PLOF s limitation due to symptoms.    Baseline moderate limitations due to pain and stiffness    Time 6    Period Weeks    Status On-going    Target Date 11/24/21      PT LONG TERM GOAL #4   Title Pt will improve Rt shoulder strength to 4/5 MMT grossly for functional strength    Baseline 3/5 for abd and ER    Time 6    Period Weeks    Status New      PT LONG TERM GOAL #5   Title Pt will perform at least 40/56 points on berg balance test to show improved balance.    Baseline 37    Time 6    Period Weeks    Status On-going    Target Date 11/24/21  Plan - 10/08/21 1119     Clinical Impression Statement Session today focused on review of HEP, and pt needing mod cues throughout for technique.  She tolerated exercises well without c/o increased pain today.  Will continue to benefit from PT to maximize function.    Personal Factors and Comorbidities Comorbidity 3+    Comorbidities HTN, DM, history  of cancer (lung), CVA, GERD    Examination-Activity Limitations Bed Mobility;Bend;Carry;Lift;Stand;Stairs;Squat;Sleep;Reach Overhead;Locomotion Level;Transfers    Examination-Participation Restrictions Cleaning;Community Activity;Driving;Yard Work;Laundry;Shop    Stability/Clinical Decision Making Evolving/Moderate complexity    Rehab Potential Good    PT Frequency 2x / week   1-2   PT Duration 8 weeks    PT Treatment/Interventions ADLs/Self Care Home Management;Cryotherapy;Electrical Stimulation;Moist Heat;Traction;Ultrasound;Therapeutic activities;Therapeutic exercise;Neuromuscular re-education;Manual techniques;Passive range of motion;Dry needling;Joint Manipulations;Vasopneumatic Device;Taping    PT Next Visit Plan review HEP PRN, continue postural/strengthening exercises, general enduance    PT Home Exercise Plan Access Code: [email or web address]    [email or web address] and Agree with Plan of Care Patient             Patient will benefit from skilled therapeutic intervention in order to improve the following deficits and impairments:  Decreased activity tolerance, Decreased balance, Decreased endurance, Decreased mobility, Decreased range of motion, Decreased strength, Difficulty walking, Postural dysfunction, Impaired flexibility, Pain  Visit Diagnosis: Cervicalgia  Chronic right shoulder pain  Muscle weakness (generalized)  Other abnormalities of gait and mobility  Abnormal posture     Problem List Patient Active Problem List   Diagnosis Date Noted   Acute coronary syndrome ([place]) 08/30/2021   Pain in right shoulder 08/27/2021   Cervical spine arthritis 08/27/2021   Allergic rhinitis 12/07/2019   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   Hyperlipidemia 12/07/2019   Hypertensive heart and renal disease 12/07/2019   Overweight 12/07/2019   Smoker 12/07/2019   Vitamin D deficiency 12/07/2019   Hypertensive emergency 12/02/2018   Subarachnoid hemorrhage ([place]) 11/29/2018   S/P lobectomy of  lung 04/21/2017   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 ([place]) 02/16/2017   Solitary pulmonary nodule 02/08/2017   Shortness of breath 12/29/2016    Class: Acute   Gastroesophageal reflux disease 03/04/2015   Peripheral vascular disease, unspecified ([place]) 07/12/2013   Atherosclerosis of native arteries of the extremities with intermittent claudication 07/12/2013   Chest pain, cardiac 06/08/2011    Class: Acute   Diabetes mellitus ([place]) 06/08/2011    Class: History of   HTN (hypertension), benign 06/08/2011    Class: Chronic   Obesity (BMI 30-39.9) 06/08/2011    Class: History of   Coronary artery disease 06/08/2011    Class: History of      Laureen Abrahams, PT, DPT 10/08/21 11:22 AM     [facility] [address] [place], [place], [address] Phone: [phone]   Fax:  [phone]  Name: [name] [name] MRN: [id number] Date of Birth: 05/11/1935

## 2021-10-15 ENCOUNTER — Ambulatory Visit: Payer: Medicare PPO | Admitting: Physical Therapy

## 2021-10-15 ENCOUNTER — Encounter: Payer: Self-pay | Admitting: Physical Therapy

## 2021-10-15 ENCOUNTER — Other Ambulatory Visit: Payer: Self-pay

## 2021-10-15 DIAGNOSIS — M6281 Muscle weakness (generalized): Secondary | ICD-10-CM | POA: Diagnosis not present

## 2021-10-15 DIAGNOSIS — R2689 Other abnormalities of gait and mobility: Secondary | ICD-10-CM

## 2021-10-15 DIAGNOSIS — G8929 Other chronic pain: Secondary | ICD-10-CM

## 2021-10-15 DIAGNOSIS — M25511 Pain in right shoulder: Secondary | ICD-10-CM

## 2021-10-15 DIAGNOSIS — R293 Abnormal posture: Secondary | ICD-10-CM

## 2021-10-15 DIAGNOSIS — M542 Cervicalgia: Secondary | ICD-10-CM | POA: Diagnosis not present

## 2021-10-15 NOTE — Therapy (Signed)
[facility] ?[address] ?[place], [place], [address] ?Phone: [phone]   Fax:  [phone] ? ?Physical Therapy Treatment/[employer] reauthorization ? ? ? ?Referring diagnosis? M54.2 ?Treatment diagnosis? (if different than referring diagnosis) same as above ?What was this (referring dx) caused by? ?[]  Surgery ?[x]  Fall ?[]  Ongoing issue ?[x]  Arthritis ?[]  Other: ____________ ?  ?Laterality: ?[x]  Rt ?[]  Lt ?[]  Both ?  ?Check all possible CPT codes:              *CHOOSE 10 OR LESS*                            ?[x]  97110 (Therapeutic Exercise)              []  92507 (SLP Treatment)           ?[x]  [id number] (Neuro Re-ed)                            []  92526 (Swallowing Treatment)           ?          [x]  [phone] Merchandiser, retail)                            []  [phone] (Cognitive Training, 1st 15 minutes) ?[x]  97140 (Manual Therapy)                      []  62836 (Cognitive Training, each add'l 15 minutes)     ?[x]  97530 (Therapeutic Activities)             []  Other, List CPT Code ____________              ?[]  62947 (Self Care)                                             ?          []  All codes above (97110 - 97535) ?          [x]  [id number] (Mechanical Traction) ?          [x]  97014 (E-stim Unattended) ?          []  97032 (E-stim manual) ?          []  97033 (Ionto) ?          [x]  97035 (Ultrasound) ?          []  [phone] Therapist, art) ?[]  03546 (Physical Performance Training) ?[]  56812 (Aquatic Therapy) ?[]  75170 (Contrast Bath) ?[]  97018 (Paraffin) ?[]  97597 (Wound Care 1st 20 sq cm) ?[]  97598 (Wound Care each add'l 20 sq cm) ?[x]  97016 (Vasopneumatic Device) ?[]  [phone] Comptroller) ?[]  [id number] (Prosthetic Training) ?Patient Details  ?Name: CHASITEE ZENKER ?MRN: [id number] ?Date of Birth: 09/14/1934 ?Referring Provider (PT): Dr. Durward Fortes ? ? ?Encounter Date: 10/15/2021 ? ? PT End of Session - 10/15/21 1022   ? ? Visit Number 5   ? Number of Visits 12   ? Date for PT Re-Evaluation 11/24/21   ?  Authorization Type [employer] MCR 12 visits until 3/16, did new humana auth on 3/16   ? Authorization - Visit Number 5   ? Authorization - Number of Visits 12   authorization submitted 1/16  ?  Progress Note Due on Visit 10   ? PT Start Time 1016   ? PT Stop Time 1100   ? PT Time Calculation (min) 44 min   ? Activity Tolerance Patient tolerated treatment well   ? Behavior During Therapy [facility] for tasks assessed/performed   ? ?  ?  ? ?  ? ? ?Past Medical History:  ?Diagnosis Date  ? Anginal pain ([place])   ? Arthritis   ? Asthma   ? Coronary artery disease   ? Diabetes mellitus   ? Dyspnea   ? Early cataracts, bilateral   ? GERD (gastroesophageal reflux disease)   ? occ  ? Hypertension   ? Lung cancer ([place])   ? Mass of upper lobe of left lung   ? mediastinal adenopathy  ? Palpitations   ? Pneumonia   ? Stroke [facility])   ? mini stroke  ? Wears dentures   ? Wears glasses   ? ? ?Past Surgical History:  ?Procedure Laterality Date  ? ABDOMINAL HYSTERECTOMY    ? ANKLE SURGERY Left   ? fusion  ? COLONOSCOPY W/ BIOPSIES AND POLYPECTOMY    ? IR ANGIO EXTRACRAN SEL COM CAROTID INNOMINATE UNI BILAT MOD SED  11/30/2018  ? IR ANGIO VERTEBRAL SEL VERTEBRAL BILAT MOD SED  11/30/2018  ? IR ANGIOGRAM SELECTIVE EACH ADDITIONAL VESSEL  11/30/2018  ? MULTIPLE TOOTH EXTRACTIONS    ? TOTAL KNEE ARTHROPLASTY Right   ? VIDEO ASSISTED THORACOSCOPY (VATS)/ LOBECTOMY Left 04/21/2017  ? Procedure: VIDEO ASSISTED THORACOSCOPY (VATS)/LEFT UPPER LOBECTOMY;  Surgeon: Melrose Nakayama, MD;  Location: [facility] [place];  Service: Thoracic;  Laterality: Left;  ? VIDEO BRONCHOSCOPY WITH ENDOBRONCHIAL NAVIGATION N/A 01/21/2017  ? Procedure: VIDEO BRONCHOSCOPY WITH ENDOBRONCHIAL NAVIGATION;  Surgeon: Melrose Nakayama, MD;  Location: [place];  Service: Thoracic;  Laterality: N/A;  ? VIDEO BRONCHOSCOPY WITH ENDOBRONCHIAL ULTRASOUND N/A 01/21/2017  ? Procedure: VIDEO BRONCHOSCOPY WITH ENDOBRONCHIAL ULTRASOUND;  Surgeon: Melrose Nakayama, MD;  Location: [place];  Service:  Thoracic;  Laterality: N/A;  ? ? ?There were no vitals filed for this visit. ? ? Subjective Assessment - 10/15/21 1025   ? ? Subjective Relays overall 6/10 pain today in her neck. She also has some complaints of left lower leg swelling and will see MD about this.   ? Diagnostic tests Imaging: Rt shoulder XR negative for acute findings but OA noted. Neck XR no acute fractures but advanced DDD and facet arthropathy, loss of normal cervical curve.   ? Patient Stated Goals reduce pain   ? Pain Onset More than a month ago   ? ?  ?  ? ?  ? ? ? ? ? [facility] PT Assessment - 10/15/21 0001   ? ?  ? Assessment  ? Medical Diagnosis Neck pain, recent fall onto Rt shoulder   ? Referring Provider (PT) Dr. Durward Fortes   ?  ? AROM  ? Right Shoulder Flexion 140 Degrees   ? Right Shoulder ABduction 110 Degrees   ? Cervical Flexion 40   ? Cervical Extension 30   ? Cervical - Right Side Bend 20   ? Cervical - Left Side Bend 25   ? Cervical - Right Rotation 50   ? Cervical - Left Rotation 50   ?  ? Strength  ? Right Shoulder Flexion 4/5   ? Right Shoulder ABduction 4/5   ? Right Shoulder Internal Rotation 4+/5   ? Right Shoulder External Rotation 4/5   ? ?  ?  ? ?  ? ? ? ? ? ? ? ? ? ? ? ? ? ? ? ?  [facility] Adult PT Treatment/Exercise - 10/15/21 0001   ? ?  ? Neuro Re-ed   ? Neuro Re-ed Details  tandem balance 30 sec X 3 bilat, sit to stand without UE support X 5 from 23.5 inch. Alt heel taps onto bottom shelf in cabinet X 10 ea without UE support.   ?  ? Neck Exercises: Machines for Strengthening  ? Other Machines for Strengthening Nu step X 10 min UE/LE L5   ?  ? Neck Exercises: Standing  ? Other Standing Exercises Rows and extensions with red X 15 ea bilat   ?  ? Neck Exercises: Seated  ? Neck Retraction 10 reps;5 secs   ? Cervical Rotation Both;10 reps   with self overpressure  ? Other Seated Exercise Pulleys 2 min abd, 2 min flexion   ? ?  ?  ? ?  ? ? ? ? ? ? ? ? ? ? ? ? PT Short Term Goals - 09/29/21 0956   ? ?  ? PT SHORT TERM GOAL #1  ?  Title Patient will demonstrate independent use of home exercise program to maintain progress from in clinic treatments.   ? Baseline revised today   ? Time 4   ? Period Weeks   ? Status On-going   ? Target Date 10/27/21   ?  ? PT SHORT TERM GOAL #2  ? Title Pt will reduce pain 25% overall   ? Baseline met for shoulder but not for neck which is still 8/10   ? Time 4   ? Period Weeks   ? Status On-going   ? Target Date 10/27/21   ? ?  ?  ? ?  ? ? ? ? PT Long Term Goals - 09/29/21 0957   ? ?  ? PT LONG TERM GOAL #1  ? Title Pt wil reduce pain overall 50% to less than 4/10 with usual activity   ? Baseline improved pain in shoulder but not for neck 8/10 pain   ? Time 6   ? Period Weeks   ? Status On-going   ? Target Date 11/24/21   ?  ? PT LONG TERM GOAL #2  ? Title Pt will improve FOTO to 60% functional   ? Time 6   ? Period Weeks   ? Status On-going   ? Target Date 11/24/21   ?  ? PT LONG TERM GOAL #3  ? Title Patient will demonstrate cervical AROM and Rt shoulder AROM WFL to facilitate daily activity including driving, self care at PLOF s limitation due to symptoms.   ? Baseline moderate limitations due to pain and stiffness   ? Time 6   ? Period Weeks   ? Status On-going   ? Target Date 11/24/21   ?  ? PT LONG TERM GOAL #4  ? Title Pt will improve Rt shoulder strength to 4/5 MMT grossly for functional strength   ? Baseline 3/5 for abd and ER   ? Time 6   ? Period Weeks   ? Status New   ?  ? PT LONG TERM GOAL #5  ? Title Pt will perform at least 40/56 points on berg balance test to show improved balance.   ? Baseline 37   ? Time 6   ? Period Weeks   ? Status On-going   ? Target Date 11/24/21   ? ?  ?  ? ?  ? ? ? ? ? ? ? ? Plan - 10/15/21 1100   ? ?  Clinical Impression Statement She did show some progress in overall neck and Rt shoulder ROM and overall UE strength. She does still have deficits in these areas along with decreased balance and is a higher risk of falling. PT recommending to continue PT to address these  deficits to improve function and reduce her risk of falling   ? Personal Factors and Comorbidities Comorbidity 3+   ? Comorbidities HTN, DM, history of cancer (lung), CVA, GERD   ? Examination-Activity Limitations Bed Mo

## 2021-10-21 ENCOUNTER — Other Ambulatory Visit: Payer: Self-pay

## 2021-10-21 ENCOUNTER — Ambulatory Visit: Payer: Medicare PPO | Admitting: Physical Therapy

## 2021-10-21 ENCOUNTER — Encounter: Payer: Self-pay | Admitting: Physical Therapy

## 2021-10-21 DIAGNOSIS — M25511 Pain in right shoulder: Secondary | ICD-10-CM

## 2021-10-21 DIAGNOSIS — R293 Abnormal posture: Secondary | ICD-10-CM

## 2021-10-21 DIAGNOSIS — M6281 Muscle weakness (generalized): Secondary | ICD-10-CM | POA: Diagnosis not present

## 2021-10-21 DIAGNOSIS — R2689 Other abnormalities of gait and mobility: Secondary | ICD-10-CM

## 2021-10-21 DIAGNOSIS — M542 Cervicalgia: Secondary | ICD-10-CM

## 2021-10-21 DIAGNOSIS — G8929 Other chronic pain: Secondary | ICD-10-CM

## 2021-10-21 NOTE — Therapy (Signed)
[facility] ?[address] ?[place], [place], [address] ?Phone: [phone]   Fax:  [phone] ? ?Physical Therapy Treatment ? ?Patient Details  ?Name: [name] [name] ?MRN: [id number] ?Date of Birth: 1935-01-11 ?Referring Provider (PT): Dr. Durward Fortes ? ? ?Encounter Date: 10/21/2021 ? ? PT End of Session - 10/21/21 0932   ? ? Visit Number 6   ? Number of Visits 12   ? Date for PT Re-Evaluation 11/24/21   ? Authorization Type [employer] MCR 12 visits 3/16 to 4/25   ? Authorization - Visit Number 6   ? Authorization - Number of Visits 12   ? Progress Note Due on Visit 10   ? PT Start Time [phone]   ? PT Stop Time 1006   ? PT Time Calculation (min) 40 min   ? Activity Tolerance Patient tolerated treatment well   ? Behavior During Therapy [facility] for tasks assessed/performed   ? ?  ?  ? ?  ? ? ?Past Medical History:  ?Diagnosis Date  ? Anginal pain ([place])   ? Arthritis   ? Asthma   ? Coronary artery disease   ? Diabetes mellitus   ? Dyspnea   ? Early cataracts, bilateral   ? GERD (gastroesophageal reflux disease)   ? occ  ? Hypertension   ? Lung cancer ([place])   ? Mass of upper lobe of left lung   ? mediastinal adenopathy  ? Palpitations   ? Pneumonia   ? Stroke [facility])   ? mini stroke  ? Wears dentures   ? Wears glasses   ? ? ?Past Surgical History:  ?Procedure Laterality Date  ? ABDOMINAL HYSTERECTOMY    ? ANKLE SURGERY Left   ? fusion  ? COLONOSCOPY W/ BIOPSIES AND POLYPECTOMY    ? IR ANGIO EXTRACRAN SEL COM CAROTID INNOMINATE UNI BILAT MOD SED  11/30/2018  ? IR ANGIO VERTEBRAL SEL VERTEBRAL BILAT MOD SED  11/30/2018  ? IR ANGIOGRAM SELECTIVE EACH ADDITIONAL VESSEL  11/30/2018  ? MULTIPLE TOOTH EXTRACTIONS    ? TOTAL KNEE ARTHROPLASTY Right   ? VIDEO ASSISTED THORACOSCOPY (VATS)/ LOBECTOMY Left 04/21/2017  ? Procedure: VIDEO ASSISTED THORACOSCOPY (VATS)/LEFT UPPER LOBECTOMY;  Surgeon: Melrose Nakayama, MD;  Location: [place];  Service: Thoracic;  Laterality: Left;  ? VIDEO BRONCHOSCOPY WITH  ENDOBRONCHIAL NAVIGATION N/A 01/21/2017  ? Procedure: VIDEO BRONCHOSCOPY WITH ENDOBRONCHIAL NAVIGATION;  Surgeon: Melrose Nakayama, MD;  Location: [place];  Service: Thoracic;  Laterality: N/A;  ? VIDEO BRONCHOSCOPY WITH ENDOBRONCHIAL ULTRASOUND N/A 01/21/2017  ? Procedure: VIDEO BRONCHOSCOPY WITH ENDOBRONCHIAL ULTRASOUND;  Surgeon: Melrose Nakayama, MD;  Location: [place];  Service: Thoracic;  Laterality: N/A;  ? ? ?There were no vitals filed for this visit. ? ? Subjective Assessment - 10/21/21 0933   ? ? Subjective Relays overall the pain is better today about 4/10 overall.   ? Diagnostic tests Imaging: Rt shoulder XR negative for acute findings but OA noted. Neck XR no acute fractures but advanced DDD and facet arthropathy, loss of normal cervical curve.   ? Patient Stated Goals reduce pain   ? Pain Onset More than a month ago   ? ?  ?  ? ?  ? ? ? [facility] PT Assessment - 10/21/21 0001   ? ?  ? Assessment  ? Medical Diagnosis Neck pain, recent fall onto Rt shoulder   ? Referring Provider (PT) Dr. Durward Fortes   ?  ? Berg Balance Test  ? Sit to Stand Able to  stand  independently using hands   ? Standing Unsupported, Alternately Place Feet on Step/Stool Able to stand independently and complete 8 steps >20 seconds   ? Standing Unsupported, One Foot in Front Able to take small step independently and hold 30 seconds   ? ?  ?  ? ?  ? ? ? ? [place] Adult PT Treatment/Exercise - 10/21/21 0001   ? ?  ? Neuro Re-ed   ? Neuro Re-ed Details  tandem balance 30 sec X 3 bilat, sit to stand without UE support 2X 5 from 23 inch. Alt heel taps onto bottom shelf in cabinet X 10 ea without UE support.   ?  ? Neck Exercises: Machines for Strengthening  ? Other Machines for Strengthening Nu step X 10 min UE/LE L5   ?  ? Neck Exercises: Standing  ? Other Standing Exercises Rows and extensions with red X 20 ea bilat   ? Other Standing Exercises reaching into top cabinet placing 2# ball onto shelf X10   ?  ? Neck Exercises: Seated  ? Neck  Retraction 10 reps;5 secs   ? Cervical Rotation Both;10 reps   ? Cervical Rotation Limitations 5 sec hold   ? Lateral Flexion Both;10 reps   ? Lateral Flexion Limitations 5 sec hold   ?  ? Knee/Hip Exercises: Seated  ? Long [employer] Both;15 reps   ? Sit to Sand --   ? ?  ?  ? ?  ? ? ? ? ? ? ? ? ? ? ? ? PT Short Term Goals - 09/29/21 0956   ? ?  ? PT SHORT TERM GOAL #1  ? Title Patient will demonstrate independent use of home exercise program to maintain progress from in clinic treatments.   ? Baseline revised today   ? Time 4   ? Period Weeks   ? Status On-going   ? Target Date 10/27/21   ?  ? PT SHORT TERM GOAL #2  ? Title Pt will reduce pain 25% overall   ? Baseline met for shoulder but not for neck which is still 8/10   ? Time 4   ? Period Weeks   ? Status On-going   ? Target Date 10/27/21   ? ?  ?  ? ?  ? ? ? ? PT Long Term Goals - 09/29/21 0957   ? ?  ? PT LONG TERM GOAL #1  ? Title Pt wil reduce pain overall 50% to less than 4/10 with usual activity   ? Baseline improved pain in shoulder but not for neck 8/10 pain   ? Time 6   ? Period Weeks   ? Status On-going   ? Target Date 11/24/21   ?  ? PT LONG TERM GOAL #2  ? Title Pt will improve FOTO to 60% functional   ? Time 6   ? Period Weeks   ? Status On-going   ? Target Date 11/24/21   ?  ? PT LONG TERM GOAL #3  ? Title Patient will demonstrate cervical AROM and Rt shoulder AROM WFL to facilitate daily activity including driving, self care at PLOF s limitation due to symptoms.   ? Baseline moderate limitations due to pain and stiffness   ? Time 6   ? Period Weeks   ? Status On-going   ? Target Date 11/24/21   ?  ? PT LONG TERM GOAL #4  ? Title Pt will improve Rt shoulder strength to 4/5 MMT  grossly for functional strength   ? Baseline 3/5 for abd and ER   ? Time 6   ? Period Weeks   ? Status New   ?  ? PT LONG TERM GOAL #5  ? Title Pt will perform at least 40/56 points on berg balance test to show improved balance.   ? Baseline 37   ? Time 6   ? Period Weeks   ?  Status On-going   ? Target Date 11/24/21   ? ?  ?  ? ?  ? ? ? ? ? ? ? ? Plan - 10/21/21 1013   ? ? Clinical Impression Statement She had better performance in tandem balance today as well as alt step taps for balance. She is still very limited by general weakness and fatigue and needs rest breaks. We willl continue to work to improve this with PT.   ? Personal Factors and Comorbidities Comorbidity 3+   ? Comorbidities HTN, DM, history of cancer (lung), CVA, GERD   ? Examination-Activity Limitations Bed Mobility;Bend;Carry;Lift;Stand;Stairs;Squat;Sleep;Reach Overhead;Locomotion Level;Transfers   ? Examination-Participation Restrictions Cleaning;Community Activity;Driving;Yard Work;Laundry;Shop   ? Stability/Clinical Decision Making Evolving/Moderate complexity   ? Rehab Potential Good   ? PT Frequency 2x / week   1-2  ? PT Duration 8 weeks   ? PT Treatment/Interventions ADLs/Self Care Home Management;Cryotherapy;Electrical Stimulation;Moist Heat;Traction;Ultrasound;Therapeutic activities;Therapeutic exercise;Neuromuscular re-education;Manual techniques;Passive range of motion;Dry needling;Joint Manipulations;Vasopneumatic Device;Taping   ? PT Next Visit Plan balance, neck/shoulder strength and mobility,  continue postural/strengthening exercises, general enduance   ? PT Home Exercise Plan Access Code: [email or web address]   ? Consulted and Agree with Plan of Care Patient   ? ?  ?  ? ?  ? ? ?Patient will benefit from skilled therapeutic intervention in order to improve the following deficits and impairments:  Decreased activity tolerance, Decreased balance, Decreased endurance, Decreased mobility, Decreased range of motion, Decreased strength, Difficulty walking, Postural dysfunction, Impaired flexibility, Pain ? ?Visit Diagnosis: ?Cervicalgia ? ?Chronic right shoulder pain ? ?Muscle weakness (generalized) ? ?Other abnormalities of gait and mobility ? ?Abnormal posture ? ? ? ? ?Problem List ?Patient Active Problem List  ?  Diagnosis Date Noted  ? Acute coronary syndrome ([place]) 08/30/2021  ? Pain in right shoulder 08/27/2021  ? Cervical spine arthritis 08/27/2021  ? Allergic rhinitis 12/07/2019  ? 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

## 2021-10-28 ENCOUNTER — Ambulatory Visit (INDEPENDENT_AMBULATORY_CARE_PROVIDER_SITE_OTHER): Payer: Medicare PPO

## 2021-10-28 ENCOUNTER — Encounter: Payer: Self-pay | Admitting: Orthopaedic Surgery

## 2021-10-28 ENCOUNTER — Other Ambulatory Visit: Payer: Self-pay

## 2021-10-28 ENCOUNTER — Ambulatory Visit: Payer: Medicare PPO | Admitting: Physical Therapy

## 2021-10-28 ENCOUNTER — Encounter: Payer: Self-pay | Admitting: Physical Therapy

## 2021-10-28 ENCOUNTER — Ambulatory Visit: Payer: Medicare PPO | Admitting: Orthopaedic Surgery

## 2021-10-28 DIAGNOSIS — R2689 Other abnormalities of gait and mobility: Secondary | ICD-10-CM | POA: Diagnosis not present

## 2021-10-28 DIAGNOSIS — M542 Cervicalgia: Secondary | ICD-10-CM

## 2021-10-28 DIAGNOSIS — G8929 Other chronic pain: Secondary | ICD-10-CM

## 2021-10-28 DIAGNOSIS — M25511 Pain in right shoulder: Secondary | ICD-10-CM

## 2021-10-28 DIAGNOSIS — M6281 Muscle weakness (generalized): Secondary | ICD-10-CM | POA: Diagnosis not present

## 2021-10-28 DIAGNOSIS — M25572 Pain in left ankle and joints of left foot: Secondary | ICD-10-CM

## 2021-10-28 NOTE — Progress Notes (Signed)
? ?Office Visit Note ?  ?Patient: [name] [name]           ?Date of Birth: 06-22-1935           ?MRN: [id number] ?Visit Date: 10/28/2021 ?             ?Requested by: Willey Blade, MD ?[address] ?[address] ?[place],  [place] [address] ?PCP: Willey Blade, MD ? ? ?Assessment & Plan: ?Visit Diagnoses: Neck pain right shoulder pain left ankle pain ? ?Plan: [name] [name] is here for follow-up on her neck arthritis her right shoulder.  With guards to her right shoulder she is doing much better since her injection and has no problems.  She is currently engaged with physical therapy and feels her neck is significantly improved.  She does however mention today that she went to see her heart doctor in follow-up and was concerned because she had some left ankle swelling.  She did not have this in the right ankle.  Her cardiologist did not think this was from her CHF.  She does relate a history of a remote injury to this ankle when she stepped in a hole.  This happened many years ago.  She denies any recent injury.  She does have quite a bit of arthritis in her tibiotalar joint and a osteochondral lesion with collapse on the lateral talar dome.  She has evidence of previous subtalar fusion.  We talked about her using her compression stockings.  She also may benefit from an ASO brace which we will provide for her today she should continue and complete her physical therapy ? ?Follow-Up Instructions: No follow-ups on file.  ? ?Orders:  ?No orders of the defined types were placed in this encounter. ? ?No orders of the defined types were placed in this encounter. ? ? ? ? Procedures: ?No procedures performed ? ? ?Clinical Data: ?No additional findings. ? ? ?Subjective: ?Chief Complaint  ?Patient presents with  ? Neck - Follow-up  ?Patient presents today for a one month follow up on her neck pain. She has been going to physical therapy. She states that she has noticed improvement.  ? ? ? ?Review of Systems  ?All other systems  reviewed and are negative. ? ? ?Objective: ?Vital Signs: There were no vitals taken for this visit. ? ?Physical Exam ?Constitutional:   ?   Appearance: Normal appearance.  ?Pulmonary:  ?   Effort: Pulmonary effort is normal.  ?Skin: ?   General: Skin is warm and dry.  ?Neurological:  ?   Mental Status: She is alert.  ? ? ?Ortho Exam ?Examination of her right shoulder she has no pain with extension flexion good strength with resisted external and internal rotation.  No impingement findings today.  Her neck she can bend her neck back and forward side to side much easier without pain. ?Examination of her left ankle she has mild soft tissue swelling compartments are soft and nontender her sensation is intact pulses are palpable.  She does have a lateral surgical scar just below the lateral malleolus.  She has some stiffness with flexion of her ankle with some pain laterally.  Subtalar motion is fairly rigid. ?Specialty Comments:  ?No specialty comments available. ? ?Imaging: ?No results found. ? ? ?PMFS History: ?Patient Active Problem List  ? Diagnosis Date Noted  ? Acute coronary syndrome ([place]) 08/30/2021  ? Pain in right shoulder 08/27/2021  ? Cervical spine arthritis 08/27/2021  ? Allergic rhinitis 12/07/2019  ? Atypical depressive  disorder 12/07/2019  ? Hyperlipidemia 12/07/2019  ? 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 Overweight 12/07/2019  ? Smoker 12/07/2019  ? Vitamin D deficiency 12/07/2019  ? Hypertensive emergency 12/02/2018  ? Subarachnoid hemorrhage ([place]) 11/29/2018  ? S/P lobectomy of lung 04/21/2017  ? 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 ([place]) 02/16/2017  ? Solitary pulmonary nodule 02/08/2017  ? Shortness of breath 12/29/2016  ?  Class: Acute  ? Gastroesophageal reflux disease 03/04/2015  ? Peripheral vascular disease, unspecified ([place]) 07/12/2013  ? Atherosclerosis of native arteries of the extremities with intermittent claudication 07/12/2013  ? Chest pain, cardiac  06/08/2011  ?  Class: Acute  ? Diabetes mellitus ([place]) 06/08/2011  ?  Class: History of  ? HTN (hypertension), benign 06/08/2011  ?  Class: Chronic  ? Obesity (BMI 30-39.9) 06/08/2011  ?  Class: History of  ? Coronary artery disease 06/08/2011  ?  Class: History of  ? ?Past Medical History:  ?Diagnosis Date  ? Anginal pain ([place])   ? Arthritis   ? Asthma   ? Coronary artery disease   ? Diabetes mellitus   ? Dyspnea   ? Early cataracts, bilateral   ? GERD (gastroesophageal reflux disease)   ? occ  ? Hypertension   ? Lung cancer ([place])   ? Mass of upper lobe of left lung   ? mediastinal adenopathy  ? Palpitations   ? Pneumonia   ? Stroke [facility])   ? mini stroke  ? Wears dentures   ? Wears glasses   ?  ?Family History  ?Problem Relation Age of Onset  ? Diabetes Mother   ? Diabetes Sister   ? Hyperlipidemia Sister   ? Diabetes Brother   ? Diabetes Son   ? Cancer Neg Hx   ?  ?Past Surgical History:  ?Procedure Laterality Date  ? ABDOMINAL HYSTERECTOMY    ? ANKLE SURGERY Left   ? fusion  ? COLONOSCOPY W/ BIOPSIES AND POLYPECTOMY    ? IR ANGIO EXTRACRAN SEL COM CAROTID INNOMINATE UNI BILAT MOD SED  11/30/2018  ? IR ANGIO VERTEBRAL SEL VERTEBRAL BILAT MOD SED  11/30/2018  ? IR ANGIOGRAM SELECTIVE EACH ADDITIONAL VESSEL  11/30/2018  ? MULTIPLE TOOTH EXTRACTIONS    ? TOTAL KNEE ARTHROPLASTY Right   ? VIDEO ASSISTED THORACOSCOPY (VATS)/ LOBECTOMY Left 04/21/2017  ? Procedure: VIDEO ASSISTED THORACOSCOPY (VATS)/LEFT UPPER LOBECTOMY;  Surgeon: Melrose Nakayama, MD;  Location: [place];  Service: Thoracic;  Laterality: Left;  ? VIDEO BRONCHOSCOPY WITH ENDOBRONCHIAL NAVIGATION N/A 01/21/2017  ? Procedure: VIDEO BRONCHOSCOPY WITH ENDOBRONCHIAL NAVIGATION;  Surgeon: Melrose Nakayama, MD;  Location: [place];  Service: Thoracic;  Laterality: N/A;  ? VIDEO BRONCHOSCOPY WITH ENDOBRONCHIAL ULTRASOUND N/A 01/21/2017  ? Procedure: VIDEO BRONCHOSCOPY WITH ENDOBRONCHIAL ULTRASOUND;  Surgeon: Melrose Nakayama, MD;  Location: [place];  Service:  Thoracic;  Laterality: N/A;  ? ?Social History  ? ?Occupational History  ? Not on file  ?Tobacco Use  ? Smoking status: Former  ?  Packs/day: 0.25  ?  Years: 12.00  ?  Pack years: 3.00  ?  Types: Cigarettes  ?  Quit date: 03/2017  ?  Years since quitting: 4.6  ? Smokeless tobacco: Never  ?Vaping Use  ? Vaping Use: Never used  ?Substance and Sexual Activity  ? Alcohol use: No  ? Drug use: No  ? Sexual activity: Not Currently  ? ? ? ? ? ? ?

## 2021-10-28 NOTE — Therapy (Signed)
[facility] ?[address] ?[place], [place], [address] ?Phone: [phone]   Fax:  [phone] ? ?Physical Therapy Treatment ? ?Patient Details  ?Name: [name] [name] ?MRN: [id number] ?Date of Birth: 12/25/1934 ?Referring Provider (PT): Dr. Durward Fortes ? ? ?Encounter Date: 10/28/2021 ? ? PT End of Session - 10/28/21 0939   ? ? Visit Number 7   ? Number of Visits 12   ? Date for PT Re-Evaluation 11/24/21   ? Authorization Type [employer] MCR 12 visits 3/16 to 4/25   ? Authorization - Visit Number 7   ? Authorization - Number of Visits 12   ? Progress Note Due on Visit 10   ? PT Start Time 0930   ? PT Stop Time 1008   ? PT Time Calculation (min) 38 min   ? Activity Tolerance Patient tolerated treatment well   ? Behavior During Therapy [facility] for tasks assessed/performed   ? ?  ?  ? ?  ? ? ?Past Medical History:  ?Diagnosis Date  ? Anginal pain ([place])   ? Arthritis   ? Asthma   ? Coronary artery disease   ? Diabetes mellitus   ? Dyspnea   ? Early cataracts, bilateral   ? GERD (gastroesophageal reflux disease)   ? occ  ? Hypertension   ? Lung cancer ([place])   ? Mass of upper lobe of left lung   ? mediastinal adenopathy  ? Palpitations   ? Pneumonia   ? Stroke [facility])   ? mini stroke  ? Wears dentures   ? Wears glasses   ? ? ?Past Surgical History:  ?Procedure Laterality Date  ? ABDOMINAL HYSTERECTOMY    ? ANKLE SURGERY Left   ? fusion  ? COLONOSCOPY W/ BIOPSIES AND POLYPECTOMY    ? IR ANGIO EXTRACRAN SEL COM CAROTID INNOMINATE UNI BILAT MOD SED  11/30/2018  ? IR ANGIO VERTEBRAL SEL VERTEBRAL BILAT MOD SED  11/30/2018  ? IR ANGIOGRAM SELECTIVE EACH ADDITIONAL VESSEL  11/30/2018  ? MULTIPLE TOOTH EXTRACTIONS    ? TOTAL KNEE ARTHROPLASTY Right   ? VIDEO ASSISTED THORACOSCOPY (VATS)/ LOBECTOMY Left 04/21/2017  ? Procedure: VIDEO ASSISTED THORACOSCOPY (VATS)/LEFT UPPER LOBECTOMY;  Surgeon: Melrose Nakayama, MD;  Location: [facility];  Service: Thoracic;  Laterality: Left;  ? VIDEO BRONCHOSCOPY WITH  ENDOBRONCHIAL NAVIGATION N/A 01/21/2017  ? Procedure: VIDEO BRONCHOSCOPY WITH ENDOBRONCHIAL NAVIGATION;  Surgeon: Melrose Nakayama, MD;  Location: [place];  Service: Thoracic;  Laterality: N/A;  ? VIDEO BRONCHOSCOPY WITH ENDOBRONCHIAL ULTRASOUND N/A 01/21/2017  ? Procedure: VIDEO BRONCHOSCOPY WITH ENDOBRONCHIAL ULTRASOUND;  Surgeon: Melrose Nakayama, MD;  Location: [place];  Service: Thoracic;  Laterality: N/A;  ? ? ?There were no vitals filed for this visit. ? ? Subjective Assessment - 10/28/21 0941   ? ? Subjective Relays the pain in her neck and shoulder are not bad today and her neck is getting a lot better. She says she went to heart doctor due to continued swelling in her Left leg and she was cleared from CHF and she was referred to have a leg XR in her left leg today so MD recommended not to walk much for the time being.   ? Diagnostic tests Imaging: Rt shoulder XR negative for acute findings but OA noted. Neck XR no acute fractures but advanced DDD and facet arthropathy, loss of normal cervical curve.   ? Patient Stated Goals reduce pain   ? Pain Onset More than a month ago   ? ?  ?  ? ?  ? ? ? ?  [place] Adult PT Treatment/Exercise - 10/28/21 0001   ? ?  ? Neuro Re-ed   ? Neuro Re-ed Details  held today   ?  ? Neck Exercises: Machines for Strengthening  ? Other Machines for Strengthening Nu step X 10 min UE/LE L5   ?  ? Neck Exercises: Standing  ? Other Standing Exercises Rows and extensions with green X 20 ea bilat, Rt shoulder ER green 2X10   ? Other Standing Exercises reaching into top cabinet placing 2# ball onto shelf X10   ?  ? Neck Exercises: Seated  ? Neck Retraction 10 reps;5 secs   ? Cervical Rotation Both;10 reps   ? Cervical Rotation Limitations 5 sec hold   ? Lateral Flexion Both;10 reps   ? Lateral Flexion Limitations 5 sec hold   ? Other Seated Exercise shoulder flexion bilat 2X10 2# bar   ? Other Seated Exercise neck extensions 10 reps   ?  ?   ?    ? ?  ?  ? ?  ? ? ? PT Short Term Goals -  10/28/21 1010   ? ?  ? PT SHORT TERM GOAL #1  ? Title Patient will demonstrate independent use of home exercise program to maintain progress from in clinic treatments.   ? Baseline revised today   ? Time 4   ? Period Weeks   ? Status Achieved   ? Target Date 10/27/21   ?  ? PT SHORT TERM GOAL #2  ? Title Pt will reduce pain 25% overall   ? Baseline met for shoulder but not for neck which is still 8/10   ? Time 4   ? Period Weeks   ? Status Achieved   ? Target Date 10/27/21   ? ?  ?  ? ?  ? ? ? ? PT Long Term Goals - 10/28/21 1010   ? ?  ? PT LONG TERM GOAL #1  ? Title Pt wil reduce pain overall 50% to less than 4/10 with usual activity   ? Baseline improved pain in shoulder but not for neck 8/10 pain   ? Time 6   ? Period Weeks   ? Status On-going   ? Target Date 11/24/21   ?  ? PT LONG TERM GOAL #2  ? Title Pt will improve FOTO to 60% functional   ? Time 6   ? Period Weeks   ? Status On-going   ? Target Date 11/24/21   ?  ? PT LONG TERM GOAL #3  ? Title Patient will demonstrate cervical AROM and Rt shoulder AROM WFL to facilitate daily activity including driving, self care at PLOF s limitation due to symptoms.   ? Baseline moderate limitations due to pain and stiffness   ? Time 6   ? Period Weeks   ? Status Achieved   ? Target Date 11/24/21   ?  ? PT LONG TERM GOAL #4  ? Title Pt will improve Rt shoulder strength to 4/5 MMT grossly for functional strength   ? Baseline 3/5 for abd and ER   ? Time 6   ? Period Weeks   ? Status On-going   ?  ? PT LONG TERM GOAL #5  ? Title Pt will perform at least 40/56 points on berg balance test to show improved balance.   ? Baseline 37   ? Time 6   ? Period Weeks   ? Status On-going   ? Target Date 11/24/21   ? ?  ?  ? ?  ? ? ? ? ? ? ? ?  Plan - 10/28/21 1000   ? ? Clinical Impression Statement We held balance today due to left leg pain and swelling and she will have XR on this later today. We still worked on Rt shoulder strength and overall neck mobility today with good overall  tolerance. She did have improved neck AROM with visual assessment noted today.   ? Personal Factors and Comorbidities Comorbidity 3+   ? Comorbidities HTN, DM, history of cancer (lung), CVA, GERD   ? Examination-Activity Limitations Bed Mobility;Bend;Carry;Lift;Stand;Stairs;Squat;Sleep;Reach Overhead;Locomotion Level;Transfers   ? Examination-Participation Restrictions Cleaning;Community Activity;Driving;Yard Work;Laundry;Shop   ? Stability/Clinical Decision Making Evolving/Moderate complexity   ? Rehab Potential Good   ? PT Frequency 2x / week   1-2  ? PT Duration 8 weeks   ? PT Treatment/Interventions ADLs/Self Care Home Management;Cryotherapy;Electrical Stimulation;Moist Heat;Traction;Ultrasound;Therapeutic activities;Therapeutic exercise;Neuromuscular re-education;Manual techniques;Passive range of motion;Dry needling;Joint Manipulations;Vasopneumatic Device;Taping   ? PT Next Visit Plan how was leg XR before going back to balance/gait, but at least continue with neck/shoulder strength and mobility, postural/strengthening exercises, general enduance   ? PT Home Exercise Plan Access Code: [email or web address]   ? Consulted and Agree with Plan of Care Patient   ? ?  ?  ? ?  ? ? ?Patient will benefit from skilled therapeutic intervention in order to improve the following deficits and impairments:  Decreased activity tolerance, Decreased balance, Decreased endurance, Decreased mobility, Decreased range of motion, Decreased strength, Difficulty walking, Postural dysfunction, Impaired flexibility, Pain ? ?Visit Diagnosis: ?Cervicalgia ? ?Chronic right shoulder pain ? ?Muscle weakness (generalized) ? ?Other abnormalities of gait and mobility ? ? ? ? ?Problem List ?Patient Active Problem List  ? Diagnosis Date Noted  ? Acute coronary syndrome ([place]) 08/30/2021  ? Pain in right shoulder 08/27/2021  ? Cervical spine arthritis 08/27/2021  ? Allergic rhinitis 12/07/2019  ? 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 Hyperlipidemia  12/07/2019  ? 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 Overweight 12/07/2019  ? Smoker 12/07/2019  ? Vitamin D deficiency 12/07/2019  ? Hypertensive emergency 12/02/2018  ? Subarachnoid hemorrhage ([place]) 04/29/

## 2021-11-04 ENCOUNTER — Ambulatory Visit: Payer: Medicare PPO | Admitting: Physical Therapy

## 2021-11-04 ENCOUNTER — Encounter: Payer: Self-pay | Admitting: Physical Therapy

## 2021-11-04 DIAGNOSIS — M6281 Muscle weakness (generalized): Secondary | ICD-10-CM

## 2021-11-04 DIAGNOSIS — R293 Abnormal posture: Secondary | ICD-10-CM

## 2021-11-04 DIAGNOSIS — R2689 Other abnormalities of gait and mobility: Secondary | ICD-10-CM | POA: Diagnosis not present

## 2021-11-04 DIAGNOSIS — M25511 Pain in right shoulder: Secondary | ICD-10-CM | POA: Diagnosis not present

## 2021-11-04 DIAGNOSIS — M542 Cervicalgia: Secondary | ICD-10-CM | POA: Diagnosis not present

## 2021-11-04 DIAGNOSIS — G8929 Other chronic pain: Secondary | ICD-10-CM

## 2021-11-04 NOTE — Therapy (Signed)
[facility] ?[address] ?[place], [place], [address] ?Phone: [phone]   Fax:  [phone] ? ?Physical Therapy Treatment ? ?Patient Details  ?Name: [name] [name] ?MRN: [id number] ?Date of Birth: Sep 29, 1934 ?Referring Provider (PT): Dr. Durward Fortes ? ? ?Encounter Date: 11/04/2021 ? ? PT End of Session - 11/04/21 0947   ? ? Visit Number 8   ? Number of Visits 12   ? Date for PT Re-Evaluation 11/24/21   ? Authorization Type [employer] MCR 12 visits 3/16 to 4/25   ? Authorization - Visit Number 8   ? Authorization - Number of Visits 12   ? Progress Note Due on Visit 10   ? PT Start Time 3785   ? PT Stop Time 1010   ? PT Time Calculation (min) 38 min   ? Activity Tolerance Patient tolerated treatment well   ? Behavior During Therapy [facility] for tasks assessed/performed   ? ?  ?  ? ?  ? ? ?Past Medical History:  ?Diagnosis Date  ? Anginal pain ([place])   ? Arthritis   ? Asthma   ? Coronary artery disease   ? Diabetes mellitus   ? Dyspnea   ? Early cataracts, bilateral   ? GERD (gastroesophageal reflux disease)   ? occ  ? Hypertension   ? Lung cancer ([place])   ? Mass of upper lobe of left lung   ? mediastinal adenopathy  ? Palpitations   ? Pneumonia   ? Stroke [facility])   ? mini stroke  ? Wears dentures   ? Wears glasses   ? ? ?Past Surgical History:  ?Procedure Laterality Date  ? ABDOMINAL HYSTERECTOMY    ? ANKLE SURGERY Left   ? fusion  ? COLONOSCOPY W/ BIOPSIES AND POLYPECTOMY    ? IR ANGIO EXTRACRAN SEL COM CAROTID INNOMINATE UNI BILAT MOD SED  11/30/2018  ? IR ANGIO VERTEBRAL SEL VERTEBRAL BILAT MOD SED  11/30/2018  ? IR ANGIOGRAM SELECTIVE EACH ADDITIONAL VESSEL  11/30/2018  ? MULTIPLE TOOTH EXTRACTIONS    ? TOTAL KNEE ARTHROPLASTY Right   ? VIDEO ASSISTED THORACOSCOPY (VATS)/ LOBECTOMY Left 04/21/2017  ? Procedure: VIDEO ASSISTED THORACOSCOPY (VATS)/LEFT UPPER LOBECTOMY;  Surgeon: Melrose Nakayama, MD;  Location: [place];  Service: Thoracic;  Laterality: Left;  ? VIDEO BRONCHOSCOPY WITH  ENDOBRONCHIAL NAVIGATION N/A 01/21/2017  ? Procedure: VIDEO BRONCHOSCOPY WITH ENDOBRONCHIAL NAVIGATION;  Surgeon: Melrose Nakayama, MD;  Location: [place];  Service: Thoracic;  Laterality: N/A;  ? VIDEO BRONCHOSCOPY WITH ENDOBRONCHIAL ULTRASOUND N/A 01/21/2017  ? Procedure: VIDEO BRONCHOSCOPY WITH ENDOBRONCHIAL ULTRASOUND;  Surgeon: Melrose Nakayama, MD;  Location: [place];  Service: Thoracic;  Laterality: N/A;  ? ? ?There were no vitals filed for this visit. ? ? Subjective Assessment - 11/04/21 0946   ? ? Subjective relays her shoulder and neck are doing good today but saw MD about her Lt ankle swelling and he has prescribed ASO brace for her to wear over the next 2 weeks.   ? Diagnostic tests Imaging: Rt shoulder XR negative for acute findings but OA noted. Neck XR no acute fractures but advanced DDD and facet arthropathy, loss of normal cervical curve.   ? Patient Stated Goals reduce pain   ? Pain Onset More than a month ago   ? ?  ?  ? ?  ? ? ? ? ? [facility] PT Assessment - 11/04/21 0001   ? ?  ? Assessment  ? Medical Diagnosis Neck pain, recent fall onto Rt  shoulder   ? Referring Provider (PT) Dr. Durward Fortes   ?  ? Berg Balance Test  ? Standing Unsupported, Alternately Place Feet on Step/Stool Able to stand independently and complete 8 steps >20 seconds   ? Standing Unsupported, One Foot in Front Able to plae foot ahead of the other independently and hold 30 seconds   ? ?  ?  ? ?  ? ? ? ? [place] [facility] Adult PT Treatment/Exercise - 11/04/21 0001   ? ?  ? Neuro Re-ed   ? Neuro Re-ed Details  tandem walk 3 round trips at countertop, alt step taps on 8 inch step X 10 bilat   ?  ? Neck Exercises: Machines for Strengthening  ? Other Machines for Strengthening UBE L3 X 6 min switching half way   ?  ? Neck Exercises: Standing  ? Other Standing Exercises Rows and extensions with green X 20 ea bilat, Bilat shoulder ER green 2X10   ? Other Standing Exercises reaching into top cabinet placing 2# ball onto shelf X10   ?  ? Neck  Exercises: Seated  ? Neck Retraction 10 reps;5 secs   ? Cervical Rotation Both;10 reps   ? Cervical Rotation Limitations 5 sec hold   ? Lateral Flexion Both;10 reps   ? Lateral Flexion Limitations 5 sec hold   ? Other Seated Exercise shoulder flexion bilat 2X10 2# bar   ? Other Seated Exercise neck extensions 10 reps   ?  ? Knee/Hip Exercises: Seated  ? Long [employer] Both;15 reps   ? ?  ?  ? ?  ? ? ? ? ? ? ? ? ? ? ? ? PT Short Term Goals - 10/28/21 1010   ? ?  ? PT SHORT TERM GOAL #1  ? Title Patient will demonstrate independent use of home exercise program to maintain progress from in clinic treatments.   ? Baseline revised today   ? Time 4   ? Period Weeks   ? Status Achieved   ? Target Date 10/27/21   ?  ? PT SHORT TERM GOAL #2  ? Title Pt will reduce pain 25% overall   ? Baseline met for shoulder but not for neck which is still 8/10   ? Time 4   ? Period Weeks   ? Status Achieved   ? Target Date 10/27/21   ? ?  ?  ? ?  ? ? ? ? PT Long Term Goals - 10/28/21 1010   ? ?  ? PT LONG TERM GOAL #1  ? Title Pt wil reduce pain overall 50% to less than 4/10 with usual activity   ? Baseline improved pain in shoulder but not for neck 8/10 pain   ? Time 6   ? Period Weeks   ? Status On-going   ? Target Date 11/24/21   ?  ? PT LONG TERM GOAL #2  ? Title Pt will improve FOTO to 60% functional   ? Time 6   ? Period Weeks   ? Status On-going   ? Target Date 11/24/21   ?  ? PT LONG TERM GOAL #3  ? Title Patient will demonstrate cervical AROM and Rt shoulder AROM WFL to facilitate daily activity including driving, self care at PLOF s limitation due to symptoms.   ? Baseline moderate limitations due to pain and stiffness   ? Time 6   ? Period Weeks   ? Status Achieved   ? Target Date 11/24/21   ?  ?  PT LONG TERM GOAL #4  ? Title Pt will improve Rt shoulder strength to 4/5 MMT grossly for functional strength   ? Baseline 3/5 for abd and ER   ? Time 6   ? Period Weeks   ? Status On-going   ?  ? PT LONG TERM GOAL #5  ? Title Pt will  perform at least 40/56 points on berg balance test to show improved balance.   ? Baseline 37   ? Time 6   ? Period Weeks   ? Status On-going   ? Target Date 11/24/21   ? ?  ?  ? ?  ? ? ? ? ? ? ? ? Plan - 11/04/21 1001   ? ? Clinical Impression Statement She is doing well from her neck and Rt shoulder standpoint. She does still have balance deficits and is somewhat limited by Rt ankle pain and swelling. She has 4 more visits left on current PT plan so I encouraged her to set up more appointments so we can maximize her function.   ? Personal Factors and Comorbidities Comorbidity 3+   ? Comorbidities HTN, DM, history of cancer (lung), CVA, GERD   ? Examination-Activity Limitations Bed Mobility;Bend;Carry;Lift;Stand;Stairs;Squat;Sleep;Reach Overhead;Locomotion Level;Transfers   ? Examination-Participation Restrictions Cleaning;Community Activity;Driving;Yard Work;Laundry;Shop   ? Stability/Clinical Decision Making Evolving/Moderate complexity   ? Rehab Potential Good   ? PT Frequency 2x / week   1-2  ? PT Duration 8 weeks   ? PT Treatment/Interventions ADLs/Self Care Home Management;Cryotherapy;Electrical Stimulation;Moist Heat;Traction;Ultrasound;Therapeutic activities;Therapeutic exercise;Neuromuscular re-education;Manual techniques;Passive range of motion;Dry needling;Joint Manipulations;Vasopneumatic Device;Taping   ? PT Next Visit Plan balance and continue with neck/shoulder strength and mobility, postural/strengthening exercises, general enduance   ? PT Home Exercise Plan Access Code: [email or web address]   ? Consulted and Agree with Plan of Care Patient   ? ?  ?  ? ?  ? ? ?Patient will benefit from skilled therapeutic intervention in order to improve the following deficits and impairments:  Decreased activity tolerance, Decreased balance, Decreased endurance, Decreased mobility, Decreased range of motion, Decreased strength, Difficulty walking, Postural dysfunction, Impaired flexibility, Pain ? ?Visit  Diagnosis: ?Cervicalgia ? ?Muscle weakness (generalized) ? ?Chronic right shoulder pain ? ?Other abnormalities of gait and mobility ? ?Abnormal posture ? ? ? ? ?Problem List ?Patient Active Problem List  ? Diagnosis Date Noted  ? Pain in l

## 2021-11-18 ENCOUNTER — Ambulatory Visit: Payer: Medicare PPO | Admitting: Physical Therapy

## 2021-11-18 ENCOUNTER — Encounter: Payer: Self-pay | Admitting: Physical Therapy

## 2021-11-18 ENCOUNTER — Other Ambulatory Visit: Payer: Self-pay

## 2021-11-18 DIAGNOSIS — R2689 Other abnormalities of gait and mobility: Secondary | ICD-10-CM | POA: Diagnosis not present

## 2021-11-18 DIAGNOSIS — G8929 Other chronic pain: Secondary | ICD-10-CM

## 2021-11-18 DIAGNOSIS — M6281 Muscle weakness (generalized): Secondary | ICD-10-CM

## 2021-11-18 DIAGNOSIS — M25511 Pain in right shoulder: Secondary | ICD-10-CM | POA: Diagnosis not present

## 2021-11-18 DIAGNOSIS — M542 Cervicalgia: Secondary | ICD-10-CM

## 2021-11-18 DIAGNOSIS — R293 Abnormal posture: Secondary | ICD-10-CM

## 2021-11-18 NOTE — Therapy (Signed)
[facility] ?[address] ?[place], [place], [address] ?Phone: [phone]   Fax:  [phone] ? ?Physical Therapy Treatment/Discharge Summary ? ?Patient Details  ?Name: [name] [name] ?MRN: [id number] ?Date of Birth: 1935/01/07 ?Referring Provider (PT): Dr. Durward Fortes ? ? ?Encounter Date: 11/18/2021 ? ? PT End of Session - 11/18/21 1021   ? ? Visit Number 9   ? Number of Visits 12   ? Date for PT Re-Evaluation 11/24/21   ? Authorization Type [employer] MCR 12 visits 3/16 to 4/25   ? Authorization - Visit Number 9   ? Authorization - Number of Visits 12   ? Progress Note Due on Visit 10   ? PT Start Time 1019   ? PT Stop Time 1057   ? PT Time Calculation (min) 38 min   ? Activity Tolerance Patient tolerated treatment well   ? Behavior During Therapy [facility] for tasks assessed/performed   ? ?  ?  ? ?  ? ? ?Past Medical History:  ?Diagnosis Date  ? Anginal pain ([place])   ? Arthritis   ? Asthma   ? Coronary artery disease   ? Diabetes mellitus   ? Dyspnea   ? Early cataracts, bilateral   ? GERD (gastroesophageal reflux disease)   ? occ  ? Hypertension   ? Lung cancer ([place])   ? Mass of upper lobe of left lung   ? mediastinal adenopathy  ? Palpitations   ? Pneumonia   ? Stroke [facility])   ? mini stroke  ? Wears dentures   ? Wears glasses   ? ? ?Past Surgical History:  ?Procedure Laterality Date  ? ABDOMINAL HYSTERECTOMY    ? ANKLE SURGERY Left   ? fusion  ? COLONOSCOPY W/ BIOPSIES AND POLYPECTOMY    ? IR ANGIO EXTRACRAN SEL COM CAROTID INNOMINATE UNI BILAT MOD SED  11/30/2018  ? IR ANGIO VERTEBRAL SEL VERTEBRAL BILAT MOD SED  11/30/2018  ? IR ANGIOGRAM SELECTIVE EACH ADDITIONAL VESSEL  11/30/2018  ? MULTIPLE TOOTH EXTRACTIONS    ? TOTAL KNEE ARTHROPLASTY Right   ? VIDEO ASSISTED THORACOSCOPY (VATS)/ LOBECTOMY Left 04/21/2017  ? Procedure: VIDEO ASSISTED THORACOSCOPY (VATS)/LEFT UPPER LOBECTOMY;  Surgeon: Melrose Nakayama, MD;  Location: [place];  Service: Thoracic;  Laterality: Left;  ? VIDEO  BRONCHOSCOPY WITH ENDOBRONCHIAL NAVIGATION N/A 01/21/2017  ? Procedure: VIDEO BRONCHOSCOPY WITH ENDOBRONCHIAL NAVIGATION;  Surgeon: Melrose Nakayama, MD;  Location: [place];  Service: Thoracic;  Laterality: N/A;  ? VIDEO BRONCHOSCOPY WITH ENDOBRONCHIAL ULTRASOUND N/A 01/21/2017  ? Procedure: VIDEO BRONCHOSCOPY WITH ENDOBRONCHIAL ULTRASOUND;  Surgeon: Melrose Nakayama, MD;  Location: [place];  Service: Thoracic;  Laterality: N/A;  ? ? ?There were no vitals filed for this visit. ? ? Subjective Assessment - 11/18/21 1022   ? ? Subjective doing well, feels like she's ready to possibly d/c today from PT.   ? Diagnostic tests Imaging: Rt shoulder XR negative for acute findings but OA noted. Neck XR no acute fractures but advanced DDD and facet arthropathy, loss of normal cervical curve.   ? Patient Stated Goals reduce pain   ? Currently in Pain? No/denies   ? Pain Onset More than a month ago   ? ?  ?  ? ?  ? ? ? ? ? [facility] PT Assessment - 11/18/21 1035   ? ?  ? Assessment  ? Medical Diagnosis Neck pain, recent fall onto Rt shoulder   ?  ? Observation/Other Assessments  ? Focus on  Therapeutic Outcomes (FOTO)  66   ?  ? AROM  ? Right Shoulder Flexion 140 Degrees   ? Right Shoulder ABduction 120 Degrees   ? Right Shoulder Internal Rotation --   FIR WNL  ? Right Shoulder External Rotation --   FER WNL  ? Cervical Flexion 50   ? Cervical Extension 30   ? Cervical - Right Side Bend 28   ? Cervical - Left Side Bend 28   ? Cervical - Right Rotation 42   ? Cervical - Left Rotation 47   ?  ? Strength  ? Right Shoulder Flexion 4/5   ? Right Shoulder ABduction 4/5   ? Right Shoulder Internal Rotation 4+/5   ? Right Shoulder External Rotation 4/5   ?  ? Berg Balance Test  ? Sit to Stand Able to stand  independently using hands   ? Standing Unsupported Able to stand safely 2 minutes   ? Sitting with Back Unsupported but Feet Supported on Floor or Stool Able to sit safely and securely 2 minutes   ? Stand to Sit Sits safely with  minimal use of hands   ? Transfers Able to transfer safely, definite need of hands   ? Standing Unsupported with Eyes Closed Able to stand 10 seconds safely   ? Standing Unsupported with Feet Together Able to place feet together independently and stand 1 minute safely   ? From Standing, Reach Forward with Outstretched Arm Can reach confidently >25 cm (10")   ? From Standing Position, Pick up Object from [place] to pick up shoe safely and easily   ? From Standing Position, Turn to Look Behind Over each Shoulder Looks behind from both sides and weight shifts well   ? Turn 360 Degrees Able to turn 360 degrees safely but slowly   ? Standing Unsupported, Alternately Place Feet on Step/Stool Able to stand independently and safely and complete 8 steps in 20 seconds   ? Standing Unsupported, One Foot in Front Able to plae foot ahead of the other independently and hold 30 seconds   ? Standing on One Leg Tries to lift leg/unable to hold 3 seconds but remains standing independently   ? Total Score 48   ? ?  ?  ? ?  ? ? ? ? ? ? ? ? ? ? ? ? ? ? ? ? [place] Adult PT Treatment/Exercise - 11/18/21 1028   ? ?  ? Neck Exercises: Machines for Strengthening  ? Other Machines for Strengthening NuStep L7 x 8 min   ? ?  ?  ? ?  ? ? ? ? ? ? ? ? ? ? ? ? PT Short Term Goals - 10/28/21 1010   ? ?  ? PT SHORT TERM GOAL #1  ? Title Patient will demonstrate independent use of home exercise program to maintain progress from in clinic treatments.   ? Baseline revised today   ? Time 4   ? Period Weeks   ? Status Achieved   ? Target Date 10/27/21   ?  ? PT SHORT TERM GOAL #2  ? Title Pt will reduce pain 25% overall   ? Baseline met for shoulder but not for neck which is still 8/10   ? Time 4   ? Period Weeks   ? Status Achieved   ? Target Date 10/27/21   ? ?  ?  ? ?  ? ? ? ? PT Long Term Goals - 11/18/21 1106   ? ?  ?  PT LONG TERM GOAL #1  ? Title Pt wil reduce pain overall 50% to less than 4/10 with usual activity   ? Baseline improved pain in  shoulder but not for neck 8/10 pain   ? Time 6   ? Period Weeks   ? Status Achieved   ? Target Date 11/24/21   ?  ? PT LONG TERM GOAL #2  ? Title Pt will improve FOTO to 60% functional   ? Time 6   ? Period Weeks   ? Status Achieved   ? Target Date 11/24/21   ?  ? PT LONG TERM GOAL #3  ? Title Patient will demonstrate cervical AROM and Rt shoulder AROM WFL to facilitate daily activity including driving, self care at PLOF s limitation due to symptoms.   ? Baseline moderate limitations due to pain and stiffness   ? Time 6   ? Period Weeks   ? Status Achieved   ? Target Date 11/24/21   ?  ? PT LONG TERM GOAL #4  ? Title Pt will improve Rt shoulder strength to 4/5 MMT grossly for functional strength   ? Baseline 3/5 for abd and ER   ? Time 6   ? Period Weeks   ? Status Achieved   ?  ? PT LONG TERM GOAL #5  ? Title Pt will perform at least 40/56 points on berg balance test to show improved balance.   ? Baseline 37   ? Time 6   ? Period Weeks   ? Status Achieved   ? Target Date 11/24/21   ? ?  ?  ? ?  ? ? ? ? ? ? ? ? ? ?Patient will benefit from skilled therapeutic intervention in order to improve the following deficits and impairments:    ? ?Visit Diagnosis: ?Cervicalgia ? ?Muscle weakness (generalized) ? ?Chronic right shoulder pain ? ?Other abnormalities of gait and mobility ? ?Abnormal posture ? ? ? ? ?Problem List ?Patient Active Problem List  ? Diagnosis Date Noted  ? Pain in left ankle and joints of left foot 10/28/2021  ? Acute coronary syndrome ([place]) 08/30/2021  ? Pain in right shoulder 08/27/2021  ? Cervical spine arthritis 08/27/2021  ? Allergic rhinitis 12/07/2019  ? 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 Hyperlipidemia 12/07/2019  ? 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 Overweight 12/07/2019  ? Smoker 12/07/2019  ? Vitamin D deficiency 12/07/2019  ? Hypertensive emergency 12/02/2018  ? Subarachnoid hemorrhage ([place]) 11/29/2018  ? S/P lobectomy of lung 04/21/2017  ? Primary malignant neoplasm  of bronchus of left upper lobe ([place]) 02/16/2017  ? Solitary pulmonary nodule 02/08/2017  ? Shortness of breath 12/29/2016  ?  Class: Acute  ? Gastroesophageal reflux disease 03/04/2015  ? Peripheral vascular disease, uns

## 2021-11-25 ENCOUNTER — Ambulatory Visit: Payer: Medicare PPO | Admitting: Orthopaedic Surgery

## 2021-11-25 ENCOUNTER — Encounter: Payer: Medicare PPO | Admitting: Physical Therapy

## 2021-11-25 ENCOUNTER — Encounter: Payer: Self-pay | Admitting: Orthopaedic Surgery

## 2021-11-25 DIAGNOSIS — M25572 Pain in left ankle and joints of left foot: Secondary | ICD-10-CM | POA: Diagnosis not present

## 2021-11-25 MED ORDER — BUPIVACAINE HCL 0.25 % IJ SOLN
2.0000 mL | INTRAMUSCULAR | Status: AC | PRN
  Administered 2021-11-25: 2 mL via INTRA_ARTICULAR

## 2021-11-25 MED ORDER — METHYLPREDNISOLONE ACETATE 40 MG/ML IJ SUSP
40.0000 mg | INTRAMUSCULAR | Status: AC | PRN
  Administered 2021-11-25: 40 mg via INTRA_ARTICULAR

## 2021-11-25 MED ORDER — LIDOCAINE HCL 1 % IJ SOLN
2.0000 mL | INTRAMUSCULAR | Status: AC | PRN
  Administered 2021-11-25: 2 mL

## 2021-11-25 NOTE — Progress Notes (Signed)
? ?Office Visit Note ?  ?Patient: [name] [name]           ?Date of Birth: [birth year]           ?MRN: [id number] ?Visit Date: 11/25/2021 ?             ?Requested by: Willey Blade, MD ?[address] ?[address] ?[place],  [place] [address] ?PCP: Willey Blade, MD ? ? ?Assessment & Plan: ?Visit Diagnoses:  ?1. Pain in left ankle and joints of left foot   ? ? ?Plan: [name] [name] has been seen in the past for problems referable to her right shoulder and arthritis in the cervical spine.  She has been through a course of physical therapy and notes that she is doing relatively well.  Her biggest problem now is the issue with her left ankle.  Prior films demonstrated osteoarthritis of the ankle with a sclerotic osteochondral lesion of the medial talar dome.  She has had prior surgery on the ankle with a subtalar fusion performed elsewhere.  She does have an ASO support and also wears a support stocking as she has some swelling of her foot and ankle even both of her legs.  She does have a cardiac history.  Her ankle has limited range of motion and there was some pain more medial than laterally so I have injected her ankle with Marcaine Xylocaine and Depo-Medrol and will monitor her response. ? ?Follow-Up Instructions: Return if symptoms worsen or fail to improve.  ? ?Orders:  ?No orders of the defined types were placed in this encounter. ? ?No orders of the defined types were placed in this encounter. ? ? ? ? Procedures: ?Medium Joint Inj on 11/25/2021 2:27 PM ?Indications: pain and joint swelling ?Details: 25 G 1.5 in needle, anteromedial approach ?Medications: 2 mL lidocaine 1 %; 2 mL bupivacaine 0.25 %; 40 mg methylPREDNISolone acetate 40 MG/ML ? ? ? ? ?Clinical Data: ?No additional findings. ? ? ?Subjective: ?Chief Complaint  ?Patient presents with  ? Right Shoulder - Follow-up  ? Left Ankle - Follow-up  ?[name] [name] relates that her biggest problem presently is with her left ankle.  She has been seen in the past  with x-rays demonstrating osteoarthritis of the ankle with a sclerotic medial talar dome osteochondral lesion.  The ankle joint is stiff and somewhat uncomfortable with ambulation.  She does have a cardiac history which apparently is causing her to have some swelling of her legs.  She is having more on the left than the right.  She is also having more swelling of her left ankle and foot which [name] to be related to her ankle ? ?HPI ? ?Review of Systems ? ? ?Objective: ?Vital Signs: There were no vitals taken for this visit. ? ?Physical Exam ?Constitutional:   ?   Appearance: She is well-developed.  ?Pulmonary:  ?   Effort: Pulmonary effort is normal.  ?Skin: ?   General: Skin is warm and dry.  ?Neurological:  ?   Mental Status: She is alert and oriented to person, place, and time.  ?Psychiatric:     ?   Behavior: Behavior normal.  ? ? ?Ortho Exam left ankle with a moderate amount of pitting edema more lateral than medial and some pitting edema of her foot as well.  Limited range of motion.  There is tenderness to palpation along the ankle more medial and lateral.  If occult to assess the peroneal tendons and posterior tibial tendon.  She has limited  subtalar motion based on her prior fusion and does appear to have a pes planus.  Skin intact ? ?Specialty Comments:  ?No specialty comments available. ? ?Imaging: ?No results found. ? ? ?PMFS History: ?Patient Active Problem List  ? Diagnosis Date Noted  ? Pain in left ankle and joints of left foot 10/28/2021  ? Acute coronary syndrome ([place]) 08/30/2021  ? Pain in right shoulder 08/27/2021  ? Cervical spine arthritis 08/27/2021  ? Allergic rhinitis 12/07/2019  ? 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 Hyperlipidemia 12/07/2019  ? 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 Overweight 12/07/2019  ? Smoker 12/07/2019  ? Vitamin D deficiency 12/07/2019  ? Hypertensive emergency 12/02/2018  ? Subarachnoid hemorrhage ([place]) 11/29/2018  ? S/P lobectomy of lung  04/21/2017  ? 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 ([place]) 02/16/2017  ? Solitary pulmonary nodule 02/08/2017  ? Shortness of breath 12/29/2016  ?  Class: Acute  ? Gastroesophageal reflux disease 03/04/2015  ? Peripheral vascular disease, unspecified ([place]) 07/12/2013  ? Atherosclerosis of native arteries of the extremities with intermittent claudication 07/12/2013  ? Chest pain, cardiac 06/08/2011  ?  Class: Acute  ? Diabetes mellitus ([place]) 06/08/2011  ?  Class: History of  ? HTN (hypertension), benign 06/08/2011  ?  Class: Chronic  ? Obesity (BMI 30-39.9) 06/08/2011  ?  Class: History of  ? Coronary artery disease 06/08/2011  ?  Class: History of  ? ?Past Medical History:  ?Diagnosis Date  ? Anginal pain ([place])   ? Arthritis   ? Asthma   ? Coronary artery disease   ? Diabetes mellitus   ? Dyspnea   ? Early cataracts, bilateral   ? GERD (gastroesophageal reflux disease)   ? occ  ? Hypertension   ? Lung cancer ([place])   ? Mass of upper lobe of left lung   ? mediastinal adenopathy  ? Palpitations   ? Pneumonia   ? Stroke [facility])   ? mini stroke  ? Wears dentures   ? Wears glasses   ?  ?Family History  ?Problem Relation Age of Onset  ? Diabetes Mother   ? Diabetes Sister   ? Hyperlipidemia Sister   ? Diabetes Brother   ? Diabetes Son   ? Cancer Neg Hx   ?  ?Past Surgical History:  ?Procedure Laterality Date  ? ABDOMINAL HYSTERECTOMY    ? ANKLE SURGERY Left   ? fusion  ? COLONOSCOPY W/ BIOPSIES AND POLYPECTOMY    ? IR ANGIO EXTRACRAN SEL COM CAROTID INNOMINATE UNI BILAT MOD SED  11/30/2018  ? IR ANGIO VERTEBRAL SEL VERTEBRAL BILAT MOD SED  11/30/2018  ? IR ANGIOGRAM SELECTIVE EACH ADDITIONAL VESSEL  11/30/2018  ? MULTIPLE TOOTH EXTRACTIONS    ? TOTAL KNEE ARTHROPLASTY Right   ? VIDEO ASSISTED THORACOSCOPY (VATS)/ LOBECTOMY Left 04/21/2017  ? Procedure: VIDEO ASSISTED THORACOSCOPY (VATS)/LEFT UPPER LOBECTOMY;  Surgeon: Melrose Nakayama, MD;  Location: [place];  Service: Thoracic;  Laterality: Left;  ? VIDEO  BRONCHOSCOPY WITH ENDOBRONCHIAL NAVIGATION N/A 01/21/2017  ? Procedure: VIDEO BRONCHOSCOPY WITH ENDOBRONCHIAL NAVIGATION;  Surgeon: Melrose Nakayama, MD;  Location: [place];  Service: Thoracic;  Laterality: N/A;  ? VIDEO BRONCHOSCOPY WITH ENDOBRONCHIAL ULTRASOUND N/A 01/21/2017  ? Procedure: VIDEO BRONCHOSCOPY WITH ENDOBRONCHIAL ULTRASOUND;  Surgeon: Melrose Nakayama, MD;  Location: [place];  Service: Thoracic;  Laterality: N/A;  ? ?Social History  ? ?Occupational History  ? Not on file  ?Tobacco Use  ? Smoking status: Former  ?  Packs/day: 0.25  ?  Years: 12.00  ?  Pack years: 3.00  ?  Types: Cigarettes  ?  Quit date: 03/2017  ?  Years since quitting: 4.7  ? Smokeless tobacco: Never  ?Vaping Use  ? Vaping Use: Never used  ?Substance and Sexual Activity  ? Alcohol use: No  ? Drug use: No  ? Sexual activity: Not Currently  ? ? ? ?Garald Balding, MD ? ? ?Note - This record has been created using [employer].  ?Chart creation errors have been sought, but may not always  ?have been located. Such creation errors do not reflect on  ?the standard of medical care. ? ?

## 2021-12-07 ENCOUNTER — Encounter: Payer: Medicare PPO | Admitting: Physical Therapy

## 2021-12-07 ENCOUNTER — Inpatient Hospital Stay (HOSPITAL_COMMUNITY)
Admit: 2021-12-07 | Discharge: 2021-12-13 | DRG: 246 | Disposition: A | Discharge status: Home / Self Care | Payer: Medicare PPO | Attending: Cardiovascular Disease | Admitting: Cardiovascular Disease

## 2021-12-07 ENCOUNTER — Inpatient Hospital Stay (HOSPITAL_COMMUNITY): Payer: Medicare PPO

## 2021-12-07 DIAGNOSIS — I272 Pulmonary hypertension, unspecified: Secondary | ICD-10-CM | POA: Diagnosis present

## 2021-12-07 DIAGNOSIS — Z96651 Presence of right artificial knee joint: Secondary | ICD-10-CM | POA: Diagnosis present

## 2021-12-07 DIAGNOSIS — Z981 Arthrodesis status: Secondary | ICD-10-CM

## 2021-12-07 DIAGNOSIS — Z87891 Personal history of nicotine dependence: Secondary | ICD-10-CM

## 2021-12-07 DIAGNOSIS — I4891 Unspecified atrial fibrillation: Secondary | ICD-10-CM | POA: Diagnosis present

## 2021-12-07 DIAGNOSIS — I130 Hypertensive heart and chronic kidney disease with heart failure and stage 1 through stage 4 chronic kidney disease, or unspecified chronic kidney disease: Secondary | ICD-10-CM | POA: Diagnosis present

## 2021-12-07 DIAGNOSIS — D638 Anemia in other chronic diseases classified elsewhere: Secondary | ICD-10-CM | POA: Diagnosis present

## 2021-12-07 DIAGNOSIS — Z7984 Long term (current) use of oral hypoglycemic drugs: Secondary | ICD-10-CM | POA: Diagnosis not present

## 2021-12-07 DIAGNOSIS — Z8701 Personal history of pneumonia (recurrent): Secondary | ICD-10-CM

## 2021-12-07 DIAGNOSIS — E876 Hypokalemia: Secondary | ICD-10-CM | POA: Diagnosis present

## 2021-12-07 DIAGNOSIS — Z85118 Personal history of other malignant neoplasm of bronchus and lung: Secondary | ICD-10-CM

## 2021-12-07 DIAGNOSIS — E1165 Type 2 diabetes mellitus with hyperglycemia: Secondary | ICD-10-CM | POA: Diagnosis present

## 2021-12-07 DIAGNOSIS — Z955 Presence of coronary angioplasty implant and graft: Secondary | ICD-10-CM | POA: Diagnosis not present

## 2021-12-07 DIAGNOSIS — Z7901 Long term (current) use of anticoagulants: Secondary | ICD-10-CM

## 2021-12-07 DIAGNOSIS — Z8673 Personal history of transient ischemic attack (TIA), and cerebral infarction without residual deficits: Secondary | ICD-10-CM | POA: Diagnosis not present

## 2021-12-07 DIAGNOSIS — I5031 Acute diastolic (congestive) heart failure: Secondary | ICD-10-CM | POA: Diagnosis present

## 2021-12-07 DIAGNOSIS — Z972 Presence of dental prosthetic device (complete) (partial): Secondary | ICD-10-CM

## 2021-12-07 DIAGNOSIS — K219 Gastro-esophageal reflux disease without esophagitis: Secondary | ICD-10-CM | POA: Diagnosis present

## 2021-12-07 DIAGNOSIS — Q231 Congenital insufficiency of aortic valve: Secondary | ICD-10-CM | POA: Diagnosis not present

## 2021-12-07 DIAGNOSIS — Z833 Family history of diabetes mellitus: Secondary | ICD-10-CM

## 2021-12-07 DIAGNOSIS — N1832 Chronic kidney disease, stage 3b: Secondary | ICD-10-CM | POA: Diagnosis present

## 2021-12-07 DIAGNOSIS — M1990 Unspecified osteoarthritis, unspecified site: Secondary | ICD-10-CM | POA: Diagnosis present

## 2021-12-07 DIAGNOSIS — N179 Acute kidney failure, unspecified: Secondary | ICD-10-CM | POA: Diagnosis present

## 2021-12-07 DIAGNOSIS — Z902 Acquired absence of lung [part of]: Secondary | ICD-10-CM

## 2021-12-07 DIAGNOSIS — Z90710 Acquired absence of both cervix and uterus: Secondary | ICD-10-CM

## 2021-12-07 DIAGNOSIS — Z79899 Other long term (current) drug therapy: Secondary | ICD-10-CM | POA: Diagnosis not present

## 2021-12-07 DIAGNOSIS — E1122 Type 2 diabetes mellitus with diabetic chronic kidney disease: Secondary | ICD-10-CM | POA: Diagnosis present

## 2021-12-07 DIAGNOSIS — E785 Hyperlipidemia, unspecified: Secondary | ICD-10-CM | POA: Diagnosis present

## 2021-12-07 DIAGNOSIS — I200 Unstable angina: Secondary | ICD-10-CM

## 2021-12-07 DIAGNOSIS — Z83438 Family history of other disorder of lipoprotein metabolism and other lipidemia: Secondary | ICD-10-CM

## 2021-12-07 DIAGNOSIS — I25110 Atherosclerotic heart disease of native coronary artery with unstable angina pectoris: Secondary | ICD-10-CM | POA: Diagnosis present

## 2021-12-07 DIAGNOSIS — J439 Emphysema, unspecified: Secondary | ICD-10-CM | POA: Diagnosis present

## 2021-12-07 LAB — COMPREHENSIVE METABOLIC PANEL
ALT: 15 U/L (ref 0–44)
AST: 17 U/L (ref 15–41)
Albumin: 3.5 g/dL (ref 3.5–5.0)
Alkaline Phosphatase: 53 U/L (ref 38–126)
Anion gap: 9 (ref 5–15)
BUN: 21 mg/dL (ref 8–23)
CO2: 23 mmol/L (ref 22–32)
Calcium: 8.7 mg/dL — ABNORMAL LOW (ref 8.9–10.3)
Chloride: 109 mmol/L (ref 98–111)
Creatinine, Ser: 1.53 mg/dL — ABNORMAL HIGH (ref 0.44–1.00)
GFR, Estimated: 33 mL/min — ABNORMAL LOW (ref >60)
Glucose, Bld: 110 mg/dL — ABNORMAL HIGH (ref 70–99)
Potassium: 3.7 mmol/L (ref 3.5–5.1)
Sodium: 141 mmol/L (ref 135–145)
Total Bilirubin: 0.7 mg/dL (ref 0.3–1.2)
Total Protein: 6.6 g/dL (ref 6.5–8.1)

## 2021-12-07 LAB — GLUCOSE, CAPILLARY
Glucose-Capillary: 129 mg/dL — ABNORMAL HIGH (ref 70–99)
Glucose-Capillary: 123 mg/dL — ABNORMAL HIGH (ref 70–99)

## 2021-12-07 LAB — CBC WITH DIFFERENTIAL/PLATELET
Abs Immature Granulocytes: 0.01 10*3/uL (ref 0.00–0.07)
Basophils Absolute: 0 10*3/uL (ref 0.0–0.1)
Basophils Relative: 0 %
Eosinophils Absolute: 0.1 10*3/uL (ref 0.0–0.5)
Eosinophils Relative: 2 %
HCT: 34.2 % — ABNORMAL LOW (ref 36.0–46.0)
Hemoglobin: 10.9 g/dL — ABNORMAL LOW (ref 12.0–15.0)
Immature Granulocytes: 0 %
Lymphocytes Relative: 36 %
Lymphs Abs: 1.6 10*3/uL (ref 0.7–4.0)
MCH: 26.4 pg (ref 26.0–34.0)
MCHC: 31.9 g/dL (ref 30.0–36.0)
MCV: 82.8 fL (ref 80.0–100.0)
Monocytes Absolute: 0.4 10*3/uL (ref 0.1–1.0)
Monocytes Relative: 8 %
Neutro Abs: 2.4 10*3/uL (ref 1.7–7.7)
Neutrophils Relative %: 54 %
Platelets: 207 10*3/uL (ref 150–400)
RBC: 4.13 MIL/uL (ref 3.87–5.11)
RDW: 15.2 % (ref 11.5–15.5)
WBC: 4.5 10*3/uL (ref 4.0–10.5)
nRBC: 0 % (ref 0.0–0.2)

## 2021-12-07 LAB — BRAIN NATRIURETIC PEPTIDE: B Natriuretic Peptide: 786.6 pg/mL — ABNORMAL HIGH (ref 0.0–100.0)

## 2021-12-07 MED ORDER — SODIUM CHLORIDE 0.9% FLUSH: 3.0000 mL | INTRAVENOUS | Status: DC | PRN

## 2021-12-07 MED ORDER — FUROSEMIDE 10 MG/ML IJ SOLN
40.0000 mg | Freq: Two times a day (BID) | INTRAMUSCULAR | Status: DC
  Administered 2021-12-07 – 2021-12-09 (×4): 40 mg via INTRAVENOUS
  Filled 2021-12-07 (×4): qty 4

## 2021-12-07 MED ORDER — ATORVASTATIN CALCIUM 10 MG PO TABS
20.0000 mg | ORAL_TABLET | Freq: Every day | ORAL | Status: DC
  Administered 2021-12-08 – 2021-12-12 (×5): 20 mg via ORAL
  Filled 2021-12-07 (×5): qty 2

## 2021-12-07 MED ORDER — METOPROLOL SUCCINATE ER 25 MG PO TB24
25.0000 mg | ORAL_TABLET | Freq: Every evening | ORAL | Status: DC
  Administered 2021-12-07 – 2021-12-12 (×6): 25 mg via ORAL
  Filled 2021-12-07 (×6): qty 1

## 2021-12-07 MED ORDER — NITROGLYCERIN 0.4 MG SL SUBL: 0.4000 mg | SUBLINGUAL_TABLET | SUBLINGUAL | Status: DC | PRN

## 2021-12-07 MED ORDER — SODIUM CHLORIDE 0.9 % IV SOLN: 250.0000 mL | INTRAVENOUS | Status: DC | PRN

## 2021-12-07 MED ORDER — ONDANSETRON HCL 4 MG/2ML IJ SOLN
4.0000 mg | Freq: Four times a day (QID) | INTRAMUSCULAR | Status: DC | PRN
Start: 2021-12-07 — End: 2021-12-13
  Administered 2021-12-10: 4 mg via INTRAVENOUS
  Filled 2021-12-07: qty 2

## 2021-12-07 MED ORDER — ACETAMINOPHEN 325 MG PO TABS
650.0000 mg | ORAL_TABLET | ORAL | Status: DC | PRN
  Administered 2021-12-08 – 2021-12-12 (×3): 650 mg via ORAL
  Filled 2021-12-07 (×3): qty 2

## 2021-12-07 MED ORDER — VITAMIN D 25 MCG (1000 UNIT) PO TABS
5000.0000 [IU] | ORAL_TABLET | Freq: Every day | ORAL | Status: DC
  Administered 2021-12-07 – 2021-12-13 (×7): 5000 [IU] via ORAL
  Filled 2021-12-07 (×9): qty 5

## 2021-12-07 MED ORDER — PANTOPRAZOLE SODIUM 40 MG PO TBEC
40.0000 mg | DELAYED_RELEASE_TABLET | Freq: Every day | ORAL | Status: DC
Start: 2021-12-07 — End: 2021-12-13
  Administered 2021-12-07 – 2021-12-13 (×7): 40 mg via ORAL
  Filled 2021-12-07 (×7): qty 1

## 2021-12-07 MED ORDER — ASPIRIN EC 81 MG PO TBEC
81.0000 mg | DELAYED_RELEASE_TABLET | Freq: Every day | ORAL | Status: DC
  Administered 2021-12-07: 81 mg via ORAL
  Filled 2021-12-07: qty 1

## 2021-12-07 MED ORDER — HEPARIN SODIUM (PORCINE) 5000 UNIT/ML IJ SOLN
5000.0000 [IU] | Freq: Three times a day (TID) | INTRAMUSCULAR | Status: DC
  Administered 2021-12-07 – 2021-12-11 (×10): 5000 [IU] via SUBCUTANEOUS
  Filled 2021-12-07 (×9): qty 1

## 2021-12-07 MED ORDER — ALBUTEROL SULFATE HFA 108 (90 BASE) MCG/ACT IN AERS: 2.0000 | INHALATION_SPRAY | Freq: Four times a day (QID) | RESPIRATORY_TRACT | Status: DC | PRN

## 2021-12-07 MED ORDER — HYDRALAZINE HCL 25 MG PO TABS
25.0000 mg | ORAL_TABLET | Freq: Two times a day (BID) | ORAL | Status: DC
  Administered 2021-12-07 – 2021-12-10 (×7): 25 mg via ORAL
  Filled 2021-12-07 (×8): qty 1

## 2021-12-07 MED ORDER — IRBESARTAN 75 MG PO TABS
75.0000 mg | ORAL_TABLET | Freq: Every day | ORAL | Status: DC
Start: 2021-12-08 — End: 2021-12-13
  Administered 2021-12-08 – 2021-12-13 (×6): 75 mg via ORAL
  Filled 2021-12-07 (×6): qty 1

## 2021-12-07 MED ORDER — GABAPENTIN 100 MG PO CAPS
100.0000 mg | ORAL_CAPSULE | Freq: Every day | ORAL | Status: DC
  Administered 2021-12-07 – 2021-12-12 (×6): 100 mg via ORAL
  Filled 2021-12-07 (×6): qty 1

## 2021-12-07 MED ORDER — SODIUM CHLORIDE 0.9% FLUSH
3.0000 mL | Freq: Two times a day (BID) | INTRAVENOUS | Status: DC
  Administered 2021-12-07 – 2021-12-10 (×6): 3 mL via INTRAVENOUS

## 2021-12-07 MED ORDER — ALBUTEROL SULFATE (2.5 MG/3ML) 0.083% IN NEBU: 2.5000 mg | INHALATION_SOLUTION | Freq: Four times a day (QID) | RESPIRATORY_TRACT | Status: DC | PRN

## 2021-12-07 NOTE — Progress Notes (Signed)
Doylene Canard MD called to make aware of pt arrival.  ?Pt is currently in bed with call bell within reach.  ?

## 2021-12-07 NOTE — H&P (Signed)
Referring Physician: ? ?[name] [name] is an 87 y.o. female.                       ?Chief Complaint: Shortness of breath and leg edema. ? ?HPI: 87 years old female with PMH of Asthma, CAD, Type 2 DM, HTN, lung cancer s/p left upper lobe surgery and stroke has 2 weeks of chest pressure, leg edema and shortness of breath. CXR shows cardiomegaly, emphysema and scattered fibrotic changes. EKG shows sinus bradycardia. ? ?Past Medical History:  ?Diagnosis Date  ? Anginal pain ([place])   ? Arthritis   ? Asthma   ? Coronary artery disease   ? Diabetes mellitus   ? Dyspnea   ? Early cataracts, bilateral   ? GERD (gastroesophageal reflux disease)   ? occ  ? Hypertension   ? Lung cancer ([place])   ? Mass of upper lobe of left lung   ? mediastinal adenopathy  ? Palpitations   ? Pneumonia   ? Stroke [facility])   ? mini stroke  ? Wears dentures   ? Wears glasses   ?  ? ? ?Past Surgical History:  ?Procedure Laterality Date  ? ABDOMINAL HYSTERECTOMY    ? ANKLE SURGERY Left   ? fusion  ? COLONOSCOPY W/ BIOPSIES AND POLYPECTOMY    ? IR ANGIO EXTRACRAN SEL COM CAROTID INNOMINATE UNI BILAT MOD SED  11/30/2018  ? IR ANGIO VERTEBRAL SEL VERTEBRAL BILAT MOD SED  11/30/2018  ? IR ANGIOGRAM SELECTIVE EACH ADDITIONAL VESSEL  11/30/2018  ? MULTIPLE TOOTH EXTRACTIONS    ? TOTAL KNEE ARTHROPLASTY Right   ? VIDEO ASSISTED THORACOSCOPY (VATS)/ LOBECTOMY Left 04/21/2017  ? Procedure: VIDEO ASSISTED THORACOSCOPY (VATS)/LEFT UPPER LOBECTOMY;  Surgeon: Melrose Nakayama, MD;  Location: [place];  Service: Thoracic;  Laterality: Left;  ? VIDEO BRONCHOSCOPY WITH ENDOBRONCHIAL NAVIGATION N/A 01/21/2017  ? Procedure: VIDEO BRONCHOSCOPY WITH ENDOBRONCHIAL NAVIGATION;  Surgeon: Melrose Nakayama, MD;  Location: [place];  Service: Thoracic;  Laterality: N/A;  ? VIDEO BRONCHOSCOPY WITH ENDOBRONCHIAL ULTRASOUND N/A 01/21/2017  ? Procedure: VIDEO BRONCHOSCOPY WITH ENDOBRONCHIAL ULTRASOUND;  Surgeon: Melrose Nakayama, MD;  Location: [place];  Service: Thoracic;   Laterality: N/A;  ? ? ?Family History  ?Problem Relation Age of Onset  ? Diabetes Mother   ? Diabetes Sister   ? Hyperlipidemia Sister   ? Diabetes Brother   ? Diabetes Son   ? Cancer Neg Hx   ? ?Social History:  reports that she quit smoking about 4 years ago. Her smoking use included cigarettes. She has a 3.00 pack-year smoking history. She has never used smokeless tobacco. She reports that she does not drink alcohol and does not use drugs. ? ?Allergies:  ?Allergies  ?Allergen Reactions  ? Penicillins Rash and Other (See Comments)  ?  PATIENT HAS HAD A PCN REACTION WITH IMMEDIATE RASH, FACIAL/TONGUE/THROAT SWELLING, SOB, OR LIGHTHEADEDNESS WITH HYPOTENSION:  #  #  #  YES  #  #  #  ?Has patient had a PCN reaction causing severe rash involving mucus membranes or skin necrosis: NO ?Has patient had a PCN reaction that required hospitalization NO ?Has patient had a PCN reaction occurring within the last 10 years: NO  ? Tramadol Itching  ? Codeine Rash  ? ? ?Medications Prior to Admission  ?Medication Sig Dispense Refill  ? acetaminophen (TYLENOL) 500 MG tablet Take 1,000 mg by mouth every 6 (six) hours as needed for mild pain or headache.    ?  albuterol (VENTOLIN HFA) 108 (90 Base) MCG/ACT inhaler Inhale 2 puffs into the lungs every 6 (six) hours as needed for wheezing or shortness of breath.    ? amLODipine (NORVASC) 10 MG tablet Take 10 mg by mouth daily.      ? aspirin EC 81 MG tablet Take 81 mg by mouth daily.      ? atorvastatin (LIPITOR) 20 MG tablet Take 20 mg by mouth daily at 6 PM.     ? Cholecalciferol (VITAMIN D3) 125 MCG (5000 UT) TABS Take 5,000 Units by mouth daily.     ? diclofenac Sodium (VOLTAREN) 1 % GEL Apply 4 g topically 4 (four) times daily as needed. (Patient taking differently: Apply 4 g topically 4 (four) times daily as needed (pain).) 500 g 6  ? gabapentin (NEURONTIN) 100 MG capsule Take 1 capsule (100 mg total) by mouth at bedtime. 30 capsule 5  ? hydrALAZINE (APRESOLINE) 25 MG tablet Take 25  mg by mouth 2 (two) times daily.     ? meclizine (ANTIVERT) 25 MG tablet Take 25 mg by mouth 2 (two) times daily as needed for dizziness.    ? metFORMIN (GLUCOPHAGE) 500 MG tablet Take 500 mg by mouth 2 (two) times daily.     ? methocarbamol (ROBAXIN) 500 MG tablet Take 1 tablet (500 mg total) by mouth every 8 (eight) hours as needed for muscle spasms. 30 tablet 0  ? metoprolol succinate (TOPROL-XL) 25 MG 24 hr tablet Take 25 mg by mouth every evening.     ? nitroGLYCERIN (NITROSTAT) 0.4 MG SL tablet Place 0.4 mg under the tongue every 5 (five) minutes as needed for chest pain.    ? omeprazole (PRILOSEC) 20 MG capsule Take 20 mg by mouth daily.    ? pioglitazone (ACTOS) 30 MG tablet Take 30 mg by mouth every evening.     ? temazepam (RESTORIL) 15 MG capsule Take 15 mg by mouth at bedtime as needed for sleep.    ? valsartan (DIOVAN) 160 MG tablet Take 160 mg by mouth daily.   5  ? ? ?Results for orders placed or performed during the hospital encounter of 12/07/21 (from the past 48 hour(s))  ?Glucose, capillary     Status: Abnormal  ? Collection Time: 12/07/21  5:57 PM  ?Result Value Ref Range  ? Glucose-Capillary 129 (H) 70 - 99 mg/dL  ?  Comment: Glucose reference range applies only to samples taken after fasting for at least 8 hours.  ?Glucose, capillary     Status: Abnormal  ? Collection Time: 12/07/21  9:23 PM  ?Result Value Ref Range  ? Glucose-Capillary 123 (H) 70 - 99 mg/dL  ?  Comment: Glucose reference range applies only to samples taken after fasting for at least 8 hours.  ? Comment 1 Notify RN   ? Comment 2 Document in Chart   ? ?DG Chest 2 View ? ?Result Date: 12/07/2021 ?CLINICAL DATA:  Congestive heart failure. EXAM: CHEST - 2 VIEW COMPARISON:  Radiographs 08/30/2021.  CT 08/30/2021. FINDINGS: Stable mild cardiomegaly with aortic atherosclerosis. Diffuse prominence of the interstitial markings is similar to the prior studies, corresponding with fibrosis and emphysema on previous CT. No definite  superimposed edema. No confluent airspace opacity, pneumothorax or significant pleural effusion. The bones appear unchanged. There is multilevel spondylosis. IMPRESSION: Stable appearance of the chest with cardiomegaly, emphysema and scattered pulmonary fibrotic changes. No definite edema or other acute process. Electronically Signed   By: Caryl Comes.D.  On: 12/07/2021 19:52   ? ?Review Of Systems ?Constitutional: No fever, chills, positive weight loss. ?Eyes: No vision change, wears glasses. No discharge or pain. ?Ears: Mild hearing loss, No tinnitus. ?Respiratory: Positive asthma, COPD, pneumonias. Positive shortness of breath. No hemoptysis. ?Cardiovascular: Positive chest pain, palpitation, leg edema. ?Gastrointestinal: No nausea, vomiting, diarrhea, constipation. No GI bleed. No hepatitis. ?Genitourinary: No dysuria, hematuria, kidney stone. No incontinance. ?Neurological: No headache, stroke, seizures.  ?Psychiatry: No psych facility admission for anxiety, depression, suicide. No detox. ?Skin: No rash. ?Musculoskeletal: Positive joint pain, no fibromyalgia. Positive neck pain, back pain. ?Lymphadenopathy: No lymphadenopathy. ?Hematology: No anemia or easy bruising. ? ? ?Blood pressure (!) 163/64, pulse 63, temperature 97.8 ?F (36.6 ?C), temperature source Oral, resp. rate 16, height 5\' 9"  (1.753 m), weight 74.5 kg, SpO2 94 %. Body mass index is 24.25 kg/m?. ?General appearance: alert, cooperative, appears stated age and no distress ?Head: Normocephalic, atraumatic. ?Eyes: Brown eyes, pink conjunctiva, corneas clear.  ?Neck: No adenopathy, no carotid bruit, positive JVD, supple, symmetrical, trachea midline and thyroid not enlarged. ?Resp: Wheezing to auscultation bilaterally. ?Cardio: Regular rate and rhythm, S1, S2 normal, II/VI systolic murmur, no click, rub or gallop ?GI: Soft, non-tender; bowel sounds normal; no organomegaly. ?Extremities: 2 + edema, no cyanosis or clubbing. ?Skin: Warm and dry.   ?Neurologic: Alert and oriented X 3, normal strength. Normal coordination and slow gait. ? ?Assessment/Plan ?Acute diastolic left heart failure ?HTN ?HLD ?Type 2 DM ?CAD ?COPD ? ?Plan: ?IV Lasix. ?Home medications. ? ?

## 2021-12-08 ENCOUNTER — Inpatient Hospital Stay (HOSPITAL_COMMUNITY): Payer: Medicare PPO

## 2021-12-08 LAB — BASIC METABOLIC PANEL
Anion gap: 7 (ref 5–15)
BUN: 23 mg/dL (ref 8–23)
CO2: 24 mmol/L (ref 22–32)
Calcium: 8.8 mg/dL — ABNORMAL LOW (ref 8.9–10.3)
Chloride: 109 mmol/L (ref 98–111)
Creatinine, Ser: 1.39 mg/dL — ABNORMAL HIGH (ref 0.44–1.00)
GFR, Estimated: 37 mL/min — ABNORMAL LOW (ref >60)
Glucose, Bld: 100 mg/dL — ABNORMAL HIGH (ref 70–99)
Potassium: 3.9 mmol/L (ref 3.5–5.1)
Sodium: 140 mmol/L (ref 135–145)

## 2021-12-08 LAB — GLUCOSE, CAPILLARY: Glucose-Capillary: 101 mg/dL — ABNORMAL HIGH (ref 70–99)

## 2021-12-08 LAB — ECHOCARDIOGRAM COMPLETE
AR max vel: 1.99 cm2
AV Area VTI: 1.92 cm2
AV Area mean vel: 1.75 cm2
AV Mean grad: 5 mmHg
AV Peak grad: 8.2 mmHg
Ao pk vel: 1.43 m/s
Area-P 1/2: 3.5 cm2
Calc EF: 40.7 %
Height: 69 in
S' Lateral: 3.2 cm
Single Plane A2C EF: 43.3 %
Single Plane A4C EF: 35.5 %
Weight: 2568 oz

## 2021-12-08 MED ORDER — DILTIAZEM HCL 60 MG PO TABS
60.0000 mg | ORAL_TABLET | Freq: Two times a day (BID) | ORAL | Status: DC
  Administered 2021-12-08 – 2021-12-13 (×11): 60 mg via ORAL
  Filled 2021-12-08 (×11): qty 1

## 2021-12-08 MED ORDER — POTASSIUM CHLORIDE CRYS ER 10 MEQ PO TBCR
10.0000 meq | EXTENDED_RELEASE_TABLET | Freq: Every day | ORAL | Status: DC
  Administered 2021-12-08 – 2021-12-10 (×3): 10 meq via ORAL
  Filled 2021-12-08 (×4): qty 1

## 2021-12-08 MED ORDER — MOMETASONE FURO-FORMOTEROL FUM 100-5 MCG/ACT IN AERO
2.0000 | INHALATION_SPRAY | Freq: Two times a day (BID) | RESPIRATORY_TRACT | Status: DC
  Administered 2021-12-08 – 2021-12-13 (×9): 2 via RESPIRATORY_TRACT
  Filled 2021-12-08 (×2): qty 8.8

## 2021-12-08 NOTE — H&P (View-Only) (Signed)
Ref: Willey Blade, MD ? ? ?Subjective:  ?Awake. Decreasing leg edema. ?VS stable. ?BNP elevated at 786.6 pg. ? ?Objective:  ?Vital Signs in the last 24 hours: ?Temp:  [97.8 ?F (36.6 ?C)-98.5 ?F (36.9 ?C)] 98.1 ?F (36.7 ?C) (05/09 7017) ?Pulse Rate:  [56-74] 67 (05/09 0726) ?Cardiac Rhythm: Atrial fibrillation (05/08 1913) ?Resp:  [16-20] 17 (05/09 0726) ?BP: (127-182)/(44-70) 173/70 (05/09 0726) ?SpO2:  [91 %-97 %] 94 % (05/09 0726) ?Weight:  [72.8 kg-74.5 kg] 72.8 kg (05/09 0333) ? ?Physical Exam: ?BP Readings from Last 1 Encounters:  ?12/08/21 (!) 173/70  ?   ?Wt Readings from Last 1 Encounters:  ?12/08/21 72.8 kg  ?  Weight change:  Body mass index is 23.7 kg/m?. ?HEENT: [place]/AT, Eyes-Brown, Conjunctiva-Pink, Sclera-Non-icteric ?Neck: Full at 0 degree angle, JVD, No bruit, Trachea midline. ?Lungs:  Clearing, Bilateral. ?Cardiac:  Regular rhythm, normal S1 and S2, no S3. II/VI systolic murmur. ?Abdomen:  Soft, non-tender. BS present. ?Extremities:  1 + edema present. No cyanosis. No clubbing. ?CNS: AxOx3, Cranial nerves grossly intact, moves all 4 extremities.  ?Skin: Warm and dry. ? ? ?Intake/Output from previous day: ?05/08 0701 - 05/09 0700 ?In: 360 [P.O.:360] ?Out: 1150 [Urine:1150] ? ? ? ?Lab Results: ?BMET ?   ?Component Value Date/Time  ? NA 140 12/08/2021 0420  ? NA 141 12/07/2021 2115  ? NA 140 08/31/2021 0333  ? NA 140 06/02/2017 1322  ? K 3.9 12/08/2021 0420  ? K 3.7 12/07/2021 2115  ? K 3.9 08/31/2021 0333  ? K 4.2 06/02/2017 1322  ? CL 109 12/08/2021 0420  ? CL 109 12/07/2021 2115  ? CL 111 08/31/2021 0333  ? CO2 24 12/08/2021 0420  ? CO2 23 12/07/2021 2115  ? CO2 20 (L) 08/31/2021 0333  ? CO2 20 (L) 06/02/2017 1322  ? GLUCOSE 100 (H) 12/08/2021 0420  ? GLUCOSE 110 (H) 12/07/2021 2115  ? GLUCOSE 143 (H) 08/31/2021 0333  ? GLUCOSE 131 06/02/2017 1322  ? BUN 23 12/08/2021 0420  ? BUN 21 12/07/2021 2115  ? BUN 19 08/31/2021 0333  ? BUN 22.9 06/02/2017 1322  ? CREATININE 1.39 (H) 12/08/2021 0420  ?  CREATININE 1.53 (H) 12/07/2021 2115  ? CREATININE 1.20 (H) 08/31/2021 0333  ? CREATININE 1.28 (H) 07/08/2021 1452  ? CREATININE 1.28 (H) 07/08/2020 0950  ? CREATININE 1.16 (H) 07/09/2019 1012  ? CREATININE 1.2 (H) 06/02/2017 1322  ? CALCIUM 8.8 (L) 12/08/2021 0420  ? CALCIUM 8.7 (L) 12/07/2021 2115  ? CALCIUM 8.6 (L) 08/31/2021 0333  ? CALCIUM 9.3 06/02/2017 1322  ? GFRNONAA 37 (L) 12/08/2021 0420  ? GFRNONAA 33 (L) 12/07/2021 2115  ? GFRNONAA 45 (L) 08/31/2021 0333  ? GFRNONAA 41 (L) 07/08/2021 1452  ? GFRNONAA 42 (L) 07/08/2020 0950  ? GFRNONAA 44 (L) 07/09/2019 1012  ? GFRAA 51 (L) 07/09/2019 1012  ? GFRAA 58 (L) 12/10/2018 0250  ? GFRAA >60 12/09/2018 0658  ? GFRAA 44 (L) 12/08/2018 0231  ? GFRAA 53 (L) 06/05/2018 0815  ? ?CBC ?   ?Component Value Date/Time  ? WBC 4.5 12/07/2021 2115  ? RBC 4.13 12/07/2021 2115  ? HGB 10.9 (L) 12/07/2021 2115  ? HGB 11.0 (L) 07/08/2021 1452  ? HGB 10.0 (L) 06/02/2017 1322  ? HCT 34.2 (L) 12/07/2021 2115  ? HCT 31.7 (L) 06/02/2017 1322  ? PLT 207 12/07/2021 2115  ? PLT 275 07/08/2021 1452  ? PLT 208 06/02/2017 1322  ? MCV 82.8 12/07/2021 2115  ? MCV 82.3 06/02/2017 1322  ?  MCH 26.4 12/07/2021 2115  ? MCHC 31.9 12/07/2021 2115  ? RDW 15.2 12/07/2021 2115  ? RDW 15.4 (H) 06/02/2017 1322  ? LYMPHSABS 1.6 12/07/2021 2115  ? LYMPHSABS 1.8 06/02/2017 1322  ? MONOABS 0.4 12/07/2021 2115  ? MONOABS 0.3 06/02/2017 1322  ? EOSABS 0.1 12/07/2021 2115  ? EOSABS 0.4 06/02/2017 1322  ? BASOSABS 0.0 12/07/2021 2115  ? BASOSABS 0.0 06/02/2017 1322  ? ?HEPATIC Function Panel ?Recent Labs  ?  07/08/21 ?1452 12/07/21 ?2115  ?PROT 7.5 6.6  ? ?HEMOGLOBIN A1C ?No components found for: HGA1C,  MPG ?CARDIAC ENZYMES ?Lab Results  ?Component Value Date  ? CKTOTAL 75 06/09/2011  ? CKMB 2.4 06/09/2011  ? TROPONINI <0.30 06/09/2011  ? TROPONINI <0.30 06/08/2011  ? TROPONINI <0.30 06/08/2011  ? ?BNP ?No results for input(s): PROBNP in the last 8760 hours. ?TSH ?No results for input(s): TSH in the last 8760  hours. ?CHOLESTEROL ?Recent Labs  ?  08/31/21 ?0333  ?CHOL 102  ? ? ?Scheduled Meds: ? aspirin EC  81 mg Oral Daily  ? atorvastatin  20 mg Oral q1800  ? cholecalciferol  5,000 Units Oral Daily  ? diltiazem  60 mg Oral BID  ? furosemide  40 mg Intravenous Q12H  ? gabapentin  100 mg Oral QHS  ? heparin  5,000 Units Subcutaneous Q8H  ? hydrALAZINE  25 mg Oral BID  ? irbesartan  75 mg Oral Daily  ? metoprolol succinate  25 mg Oral QPM  ? mometasone-formoterol  2 puff Inhalation BID  ? pantoprazole  40 mg Oral Daily  ? potassium chloride  10 mEq Oral Daily  ? sodium chloride flush  3 mL Intravenous Q12H  ? ?Continuous Infusions: ? sodium chloride    ? ?PRN Meds:.sodium chloride, acetaminophen, albuterol, nitroGLYCERIN, ondansetron (ZOFRAN) IV, sodium chloride flush ? ?Assessment/Plan: ? Acute diastolic left heart failure ?COPD ?Emphysema ?Shortness of breath ?HTN ?HLD ?Type 2 DM ?CAD ?CKD, II ? ?Plan: ?Continue IV diuretics. ?Add Dulera for COPD ? ?  LOS: 1 day  ? ?Time spent including chart review, lab review, examination, discussion with patient/Nurse : 30 min ? ? ?Dixie Dials  MD  ?12/08/2021, 8:23 AM ? ? ? ? ?

## 2021-12-08 NOTE — Progress Notes (Signed)
?  Echocardiogram ?2D Echocardiogram has been performed. ? ?[name] [name] ?12/08/2021, 10:56 AM ?

## 2021-12-08 NOTE — Progress Notes (Signed)
Ref: Willey Blade, MD ? ? ?Subjective:  ?Awake. Decreasing leg edema. ?VS stable. ?BNP elevated at 786.6 pg. ? ?Objective:  ?Vital Signs in the last 24 hours: ?Temp:  [97.8 ?F (36.6 ?C)-98.5 ?F (36.9 ?C)] 98.1 ?F (36.7 ?C) (05/09 0539) ?Pulse Rate:  [56-74] 67 (05/09 0726) ?Cardiac Rhythm: Atrial fibrillation (05/08 1913) ?Resp:  [16-20] 17 (05/09 0726) ?BP: (127-182)/(44-70) 173/70 (05/09 0726) ?SpO2:  [91 %-97 %] 94 % (05/09 0726) ?Weight:  [72.8 kg-74.5 kg] 72.8 kg (05/09 0333) ? ?Physical Exam: ?BP Readings from Last 1 Encounters:  ?12/08/21 (!) 173/70  ?   ?Wt Readings from Last 1 Encounters:  ?12/08/21 72.8 kg  ?  Weight change:  Body mass index is 23.7 kg/m?. ?HEENT: [place]/AT, Eyes-Brown, Conjunctiva-Pink, Sclera-Non-icteric ?Neck: Full at 0 degree angle, JVD, No bruit, Trachea midline. ?Lungs:  Clearing, Bilateral. ?Cardiac:  Regular rhythm, normal S1 and S2, no S3. II/VI systolic murmur. ?Abdomen:  Soft, non-tender. BS present. ?Extremities:  1 + edema present. No cyanosis. No clubbing. ?CNS: AxOx3, Cranial nerves grossly intact, moves all 4 extremities.  ?Skin: Warm and dry. ? ? ?Intake/Output from previous day: ?05/08 0701 - 05/09 0700 ?In: 360 [P.O.:360] ?Out: 1150 [Urine:1150] ? ? ? ?Lab Results: ?BMET ?   ?Component Value Date/Time  ? NA 140 12/08/2021 0420  ? NA 141 12/07/2021 2115  ? NA 140 08/31/2021 0333  ? NA 140 06/02/2017 1322  ? K 3.9 12/08/2021 0420  ? K 3.7 12/07/2021 2115  ? K 3.9 08/31/2021 0333  ? K 4.2 06/02/2017 1322  ? CL 109 12/08/2021 0420  ? CL 109 12/07/2021 2115  ? CL 111 08/31/2021 0333  ? CO2 24 12/08/2021 0420  ? CO2 23 12/07/2021 2115  ? CO2 20 (L) 08/31/2021 0333  ? CO2 20 (L) 06/02/2017 1322  ? GLUCOSE 100 (H) 12/08/2021 0420  ? GLUCOSE 110 (H) 12/07/2021 2115  ? GLUCOSE 143 (H) 08/31/2021 0333  ? GLUCOSE 131 06/02/2017 1322  ? BUN 23 12/08/2021 0420  ? BUN 21 12/07/2021 2115  ? BUN 19 08/31/2021 0333  ? BUN 22.9 06/02/2017 1322  ? CREATININE 1.39 (H) 12/08/2021 0420  ?  CREATININE 1.53 (H) 12/07/2021 2115  ? CREATININE 1.20 (H) 08/31/2021 0333  ? CREATININE 1.28 (H) 07/08/2021 1452  ? CREATININE 1.28 (H) 07/08/2020 0950  ? CREATININE 1.16 (H) 07/09/2019 1012  ? CREATININE 1.2 (H) 06/02/2017 1322  ? CALCIUM 8.8 (L) 12/08/2021 0420  ? CALCIUM 8.7 (L) 12/07/2021 2115  ? CALCIUM 8.6 (L) 08/31/2021 0333  ? CALCIUM 9.3 06/02/2017 1322  ? GFRNONAA 37 (L) 12/08/2021 0420  ? GFRNONAA 33 (L) 12/07/2021 2115  ? GFRNONAA 45 (L) 08/31/2021 0333  ? GFRNONAA 41 (L) 07/08/2021 1452  ? GFRNONAA 42 (L) 07/08/2020 0950  ? GFRNONAA 44 (L) 07/09/2019 1012  ? GFRAA 51 (L) 07/09/2019 1012  ? GFRAA 58 (L) 12/10/2018 0250  ? GFRAA >60 12/09/2018 0658  ? GFRAA 44 (L) 12/08/2018 0231  ? GFRAA 53 (L) 06/05/2018 0815  ? ?CBC ?   ?Component Value Date/Time  ? WBC 4.5 12/07/2021 2115  ? RBC 4.13 12/07/2021 2115  ? HGB 10.9 (L) 12/07/2021 2115  ? HGB 11.0 (L) 07/08/2021 1452  ? HGB 10.0 (L) 06/02/2017 1322  ? HCT 34.2 (L) 12/07/2021 2115  ? HCT 31.7 (L) 06/02/2017 1322  ? PLT 207 12/07/2021 2115  ? PLT 275 07/08/2021 1452  ? PLT 208 06/02/2017 1322  ? MCV 82.8 12/07/2021 2115  ? MCV 82.3 06/02/2017 1322  ?  MCH 26.4 12/07/2021 2115  ? MCHC 31.9 12/07/2021 2115  ? RDW 15.2 12/07/2021 2115  ? RDW 15.4 (H) 06/02/2017 1322  ? LYMPHSABS 1.6 12/07/2021 2115  ? LYMPHSABS 1.8 06/02/2017 1322  ? MONOABS 0.4 12/07/2021 2115  ? MONOABS 0.3 06/02/2017 1322  ? EOSABS 0.1 12/07/2021 2115  ? EOSABS 0.4 06/02/2017 1322  ? BASOSABS 0.0 12/07/2021 2115  ? BASOSABS 0.0 06/02/2017 1322  ? ?HEPATIC Function Panel ?Recent Labs  ?  07/08/21 ?1452 12/07/21 ?2115  ?PROT 7.5 6.6  ? ?HEMOGLOBIN A1C ?No components found for: HGA1C,  MPG ?CARDIAC ENZYMES ?Lab Results  ?Component Value Date  ? CKTOTAL 75 06/09/2011  ? CKMB 2.4 06/09/2011  ? TROPONINI <0.30 06/09/2011  ? TROPONINI <0.30 06/08/2011  ? TROPONINI <0.30 06/08/2011  ? ?BNP ?No results for input(s): PROBNP in the last 8760 hours. ?TSH ?No results for input(s): TSH in the last 8760  hours. ?CHOLESTEROL ?Recent Labs  ?  08/31/21 ?0333  ?CHOL 102  ? ? ?Scheduled Meds: ? aspirin EC  81 mg Oral Daily  ? atorvastatin  20 mg Oral q1800  ? cholecalciferol  5,000 Units Oral Daily  ? diltiazem  60 mg Oral BID  ? furosemide  40 mg Intravenous Q12H  ? gabapentin  100 mg Oral QHS  ? heparin  5,000 Units Subcutaneous Q8H  ? hydrALAZINE  25 mg Oral BID  ? irbesartan  75 mg Oral Daily  ? metoprolol succinate  25 mg Oral QPM  ? mometasone-formoterol  2 puff Inhalation BID  ? pantoprazole  40 mg Oral Daily  ? potassium chloride  10 mEq Oral Daily  ? sodium chloride flush  3 mL Intravenous Q12H  ? ?Continuous Infusions: ? sodium chloride    ? ?PRN Meds:.sodium chloride, acetaminophen, albuterol, nitroGLYCERIN, ondansetron (ZOFRAN) IV, sodium chloride flush ? ?Assessment/Plan: ? Acute diastolic left heart failure ?COPD ?Emphysema ?Shortness of breath ?HTN ?HLD ?Type 2 DM ?CAD ?CKD, II ? ?Plan: ?Continue IV diuretics. ?Add Dulera for COPD ? ?  LOS: 1 day  ? ?Time spent including chart review, lab review, examination, discussion with patient/Nurse : 30 min ? ? ?Dixie Dials  MD  ?12/08/2021, 8:23 AM ? ? ? ? ?

## 2021-12-09 ENCOUNTER — Encounter (HOSPITAL_COMMUNITY)
Disposition: A | Discharge status: Home / Self Care | Payer: Self-pay | Source: Home / Self Care | Attending: Cardiovascular Disease

## 2021-12-09 ENCOUNTER — Other Ambulatory Visit: Payer: Self-pay

## 2021-12-09 ENCOUNTER — Encounter (HOSPITAL_COMMUNITY): Payer: Self-pay | Admitting: Cardiovascular Disease

## 2021-12-09 DIAGNOSIS — I25110 Atherosclerotic heart disease of native coronary artery with unstable angina pectoris: Secondary | ICD-10-CM | POA: Diagnosis not present

## 2021-12-09 DIAGNOSIS — I200 Unstable angina: Secondary | ICD-10-CM

## 2021-12-09 HISTORY — PX: CORONARY STENT INTERVENTION: CATH118234

## 2021-12-09 HISTORY — PX: RIGHT/LEFT HEART CATH AND CORONARY ANGIOGRAPHY: CATH118266

## 2021-12-09 LAB — BASIC METABOLIC PANEL
Anion gap: 4 — ABNORMAL LOW (ref 5–15)
BUN: 24 mg/dL — ABNORMAL HIGH (ref 8–23)
CO2: 29 mmol/L (ref 22–32)
Calcium: 8.6 mg/dL — ABNORMAL LOW (ref 8.9–10.3)
Chloride: 106 mmol/L (ref 98–111)
Creatinine, Ser: 1.3 mg/dL — ABNORMAL HIGH (ref 0.44–1.00)
GFR, Estimated: 41 mL/min — ABNORMAL LOW (ref >60)
Glucose, Bld: 109 mg/dL — ABNORMAL HIGH (ref 70–99)
Potassium: 3.9 mmol/L (ref 3.5–5.1)
Sodium: 139 mmol/L (ref 135–145)

## 2021-12-09 LAB — POCT I-STAT 7, (LYTES, BLD GAS, ICA,H+H)
Acid-Base Excess: 4 mmol/L — ABNORMAL HIGH (ref 0.0–2.0)
Bicarbonate: 28.8 mmol/L — ABNORMAL HIGH (ref 20.0–28.0)
Calcium, Ion: 1.18 mmol/L (ref 1.15–1.40)
HCT: 35 % — ABNORMAL LOW (ref 36.0–46.0)
Hemoglobin: 11.9 g/dL — ABNORMAL LOW (ref 12.0–15.0)
O2 Saturation: 95 %
Potassium: 3.7 mmol/L (ref 3.5–5.1)
Sodium: 141 mmol/L (ref 135–145)
TCO2: 30 mmol/L (ref 22–32)
pCO2 arterial: 42.1 mmHg (ref 32–48)
pH, Arterial: 7.443 (ref 7.35–7.45)
pO2, Arterial: 73 mmHg — ABNORMAL LOW (ref 83–108)

## 2021-12-09 LAB — POCT I-STAT EG7
Acid-Base Excess: 5 mmol/L — ABNORMAL HIGH (ref 0.0–2.0)
Bicarbonate: 30.6 mmol/L — ABNORMAL HIGH (ref 20.0–28.0)
Calcium, Ion: 1.2 mmol/L (ref 1.15–1.40)
HCT: 36 % (ref 36.0–46.0)
Hemoglobin: 12.2 g/dL (ref 12.0–15.0)
O2 Saturation: 71 %
Potassium: 3.7 mmol/L (ref 3.5–5.1)
Sodium: 141 mmol/L (ref 135–145)
TCO2: 32 mmol/L (ref 22–32)
pCO2, Ven: 46.1 mmHg (ref 44–60)
pH, Ven: 7.431 — ABNORMAL HIGH (ref 7.25–7.43)
pO2, Ven: 37 mmHg (ref 32–45)

## 2021-12-09 LAB — POCT ACTIVATED CLOTTING TIME
Activated Clotting Time: 287 seconds
Activated Clotting Time: 245 seconds
Activated Clotting Time: 215 seconds
Activated Clotting Time: 203 seconds
Activated Clotting Time: 197 seconds

## 2021-12-09 LAB — GLUCOSE, CAPILLARY: Glucose-Capillary: 117 mg/dL — ABNORMAL HIGH (ref 70–99)

## 2021-12-09 SURGERY — RIGHT/LEFT HEART CATH AND CORONARY ANGIOGRAPHY
Anesthesia: LOCAL

## 2021-12-09 MED ORDER — SODIUM CHLORIDE 0.9 % IV SOLN: INTRAVENOUS | Status: DC

## 2021-12-09 MED ORDER — SODIUM CHLORIDE 0.9 % IV SOLN: 250.0000 mL | INTRAVENOUS | Status: DC | PRN

## 2021-12-09 MED ORDER — SODIUM CHLORIDE 0.9% FLUSH
3.0000 mL | Freq: Two times a day (BID) | INTRAVENOUS | Status: DC
  Administered 2021-12-10: 3 mL via INTRAVENOUS

## 2021-12-09 MED ORDER — FENTANYL CITRATE (PF) 100 MCG/2ML IJ SOLN
INTRAMUSCULAR | Status: DC | PRN
Start: 2021-12-09 — End: 2021-12-09
  Administered 2021-12-09 (×2): 25 ug via INTRAVENOUS

## 2021-12-09 MED ORDER — ASPIRIN 325 MG PO TABS
ORAL_TABLET | ORAL | Status: AC
Start: 2021-12-09 — End: ?
  Filled 2021-12-09: qty 1

## 2021-12-09 MED ORDER — HYDRALAZINE HCL 20 MG/ML IJ SOLN: 10.0000 mg | Freq: Once | INTRAMUSCULAR | Status: DC

## 2021-12-09 MED ORDER — SODIUM CHLORIDE 0.9% FLUSH: 3.0000 mL | INTRAVENOUS | Status: DC | PRN

## 2021-12-09 MED ORDER — CLOPIDOGREL BISULFATE 300 MG PO TABS
ORAL_TABLET | ORAL | Status: DC | PRN
  Administered 2021-12-09: 600 mg via ORAL

## 2021-12-09 MED ORDER — FUROSEMIDE 40 MG PO TABS
40.0000 mg | ORAL_TABLET | Freq: Every day | ORAL | Status: DC
Start: 2021-12-10 — End: 2021-12-11
  Administered 2021-12-10: 40 mg via ORAL
  Filled 2021-12-09 (×2): qty 1

## 2021-12-09 MED ORDER — FENTANYL CITRATE (PF) 100 MCG/2ML IJ SOLN
INTRAMUSCULAR | Status: AC
  Filled 2021-12-09: qty 2

## 2021-12-09 MED ORDER — SODIUM CHLORIDE 0.9 % IV SOLN: INTRAVENOUS | Status: AC

## 2021-12-09 MED ORDER — HEPARIN (PORCINE) IN NACL 1000-0.9 UT/500ML-% IV SOLN
INTRAVENOUS | Status: AC
  Filled 2021-12-09: qty 1000

## 2021-12-09 MED ORDER — LIDOCAINE HCL (PF) 1 % IJ SOLN
INTRAMUSCULAR | Status: AC
  Filled 2021-12-09: qty 30

## 2021-12-09 MED ORDER — MIDAZOLAM HCL 2 MG/2ML IJ SOLN
INTRAMUSCULAR | Status: DC | PRN
  Administered 2021-12-09: 1 mg via INTRAVENOUS

## 2021-12-09 MED ORDER — CLOPIDOGREL BISULFATE 75 MG PO TABS
75.0000 mg | ORAL_TABLET | Freq: Every day | ORAL | Status: DC
  Administered 2021-12-10 – 2021-12-13 (×4): 75 mg via ORAL
  Filled 2021-12-09 (×4): qty 1

## 2021-12-09 MED ORDER — HEPARIN (PORCINE) IN NACL 1000-0.9 UT/500ML-% IV SOLN
INTRAVENOUS | Status: DC | PRN
  Administered 2021-12-09 (×2): 500 mL

## 2021-12-09 MED ORDER — HEPARIN SODIUM (PORCINE) 1000 UNIT/ML IJ SOLN
INTRAMUSCULAR | Status: DC | PRN
  Administered 2021-12-09: 6000 [IU] via INTRAVENOUS

## 2021-12-09 MED ORDER — ASPIRIN 325 MG PO TABS
ORAL_TABLET | ORAL | Status: DC | PRN
  Administered 2021-12-09: 325 mg via ORAL

## 2021-12-09 MED ORDER — LABETALOL HCL 5 MG/ML IV SOLN: 10.0000 mg | INTRAVENOUS | Status: AC | PRN

## 2021-12-09 MED ORDER — HYDRALAZINE HCL 20 MG/ML IJ SOLN
INTRAMUSCULAR | Status: AC
  Filled 2021-12-09: qty 1

## 2021-12-09 MED ORDER — SODIUM CHLORIDE 0.9% FLUSH
3.0000 mL | Freq: Two times a day (BID) | INTRAVENOUS | Status: DC
  Administered 2021-12-09 – 2021-12-10 (×2): 3 mL via INTRAVENOUS

## 2021-12-09 MED ORDER — CLOPIDOGREL BISULFATE 300 MG PO TABS
ORAL_TABLET | ORAL | Status: AC
  Filled 2021-12-09: qty 2

## 2021-12-09 MED ORDER — ASPIRIN EC 81 MG PO TBEC
81.0000 mg | DELAYED_RELEASE_TABLET | Freq: Every day | ORAL | Status: DC
  Administered 2021-12-09 – 2021-12-13 (×4): 81 mg via ORAL
  Filled 2021-12-09 (×4): qty 1

## 2021-12-09 MED ORDER — MIDAZOLAM HCL 2 MG/2ML IJ SOLN
INTRAMUSCULAR | Status: AC
  Filled 2021-12-09: qty 2

## 2021-12-09 MED ORDER — IOHEXOL 350 MG/ML SOLN
INTRAVENOUS | Status: DC | PRN
  Administered 2021-12-09: 60 mL via INTRA_ARTERIAL

## 2021-12-09 MED ORDER — IOHEXOL 350 MG/ML SOLN
INTRAVENOUS | Status: DC | PRN
  Administered 2021-12-09: 110 mL via INTRA_ARTERIAL

## 2021-12-09 MED ORDER — HEPARIN SODIUM (PORCINE) 1000 UNIT/ML IJ SOLN
INTRAMUSCULAR | Status: AC
  Filled 2021-12-09: qty 10

## 2021-12-09 SURGICAL SUPPLY — 23 items
BALLN ~~LOC~~ EUPHORA RX 2.75X12 (BALLOONS) ×2
BALLN ~~LOC~~ EUPHORA RX 3.75X12 (BALLOONS) ×2
BALLOON ~~LOC~~ EUPHORA RX 2.75X12 (BALLOONS) IMPLANT
BALLOON ~~LOC~~ EUPHORA RX 3.75X12 (BALLOONS) IMPLANT
CATH INFINITI 5FR MULTPACK ANG (CATHETERS) ×1 IMPLANT
CATH SWAN GANZ 7F STRAIGHT (CATHETERS) ×1 IMPLANT
CATH VISTA GUIDE 6FR XBLAD3.5 (CATHETERS) ×1 IMPLANT
CLOSURE PERCLOSE PROSTYLE (VASCULAR PRODUCTS) ×1 IMPLANT
GUIDEWIRE .025 260CM (WIRE) ×1 IMPLANT
GUIDEWIRE ANGLED .035X150CM (WIRE) ×1 IMPLANT
KIT ENCORE 26 ADVANTAGE (KITS) ×1 IMPLANT
KIT HEART LEFT (KITS) ×2 IMPLANT
PACK CARDIAC CATHETERIZATION (CUSTOM PROCEDURE TRAY) ×2 IMPLANT
SHEATH PINNACLE 5F 10CM (SHEATH) ×1 IMPLANT
SHEATH PINNACLE 6F 10CM (SHEATH) ×1 IMPLANT
SHEATH PINNACLE 7F 10CM (SHEATH) ×1 IMPLANT
STENT SYNERGY XD 3.50X16 (Permanent Stent) IMPLANT
SYNERGY XD 3.50X16 (Permanent Stent) ×2 IMPLANT
TRANSDUCER W/STOPCOCK (MISCELLANEOUS) ×2 IMPLANT
TUBING CIL FLEX 10 FLL-RA (TUBING) ×1 IMPLANT
WIRE EMERALD 3MM-J .025X260CM (WIRE) ×1 IMPLANT
WIRE EMERALD 3MM-J .035X150CM (WIRE) ×2 IMPLANT
WIRE RUNTHROUGH .014X180CM (WIRE) ×1 IMPLANT

## 2021-12-09 NOTE — TOC Progression Note (Addendum)
Transition of Care (TOC) - Progression Note  ? ? ?Patient Details  ?Name: [name] [name] ?MRN: [id number] ?Date of Birth: 1935-01-06 ? ?Transition of Care (TOC) CM/SW Contact  ?Zenon Mayo, RN ?Phone Number: ?12/09/2021, 4:07 PM ? ?Clinical Narrative:    ? ?Patient from home with son,  with CHF , she is for Left heart cath today,  conts on IV lasix.  TOC will continue to follow for dc needs.  Pharmacy checking benefit for brilinta. ? ?  ?  ? ?Expected Discharge Plan and Services ?  ?  ?  ?  ?  ?                ?  ?  ?  ?  ?  ?  ?  ?  ?  ?  ? ? ?Social Determinants of Health (SDOH) Interventions ?Food Insecurity Interventions: Intervention Not Indicated ?Housing Interventions: Intervention Not Indicated ?Transportation Interventions: Intervention Not Indicated ? ?Readmission Risk Interventions ?   ? View : No data to display.  ?  ?  ?  ? ? ?

## 2021-12-09 NOTE — Progress Notes (Signed)
Heart Failure Navigator Progress Note ? ?Following this hospitalization to assess for HV TOC readiness.  ? ?EF 40-45% ?Heart cath scheduled 12/09/21 @ 2:30 pm.  ? ?Earnestine Leys, BSN, RN ?Heart Failure Nurse Navigator ?Secure Chat Only  ? ? ? ?

## 2021-12-09 NOTE — Interval H&P Note (Signed)
History and Physical Interval Note: ? ?12/09/2021 ?11:37 AM ? ?[name] [name]  has presented today for surgery, with the diagnosis of chf.  The various methods of treatment have been discussed with the patient and family. After consideration of risks, benefits and other options for treatment, the patient has consented to  Procedure(s): ?RIGHT/LEFT HEART CATH AND CORONARY ANGIOGRAPHY (N/A) as a surgical intervention.  The patient's history has been reviewed, patient examined, no change in status, stable for surgery.  I have reviewed the patient's chart and labs.  Questions were answered to the patient's satisfaction.   ? ? ?Birdie Riddle ? ? ?

## 2021-12-09 NOTE — Progress Notes (Signed)
Site area: Right groin a 7 french venous sheath was removed ? ?Site Prior to Removal:  Level 0 ? ?Pressure Applied For 20 MINUTES   ? ?Bedrest Beginning at 1715pm for 2 hours ? ?Manual:   Yes.   ? ?Patient Status During Pull:  stable ? ?Post Pull Groin Site:  Level 0 ? ?Post Pull Instructions Given:  Yes.   ? ?Post Pull Pulses Present:  Yes.   ? ?Dressing Applied:  Yes.   ? ?Comments:    ?

## 2021-12-09 NOTE — Progress Notes (Signed)
Heart Failure Nurse Navigator Progress Note ? ?PCP: Willey Blade, MD ?PCP-Cardiologist: Doylene Canard ?Admission Diagnosis: Congested Heart Failure ?Admitted from: Home ? ?Presentation:   ?[name] [name] presented with shortness of breath and leg edema, 2 weeks of chest pressure. BP 163/64, BNP 786.6, Creat 1.53, IV lasix given, Chest xray and EKG ordered. Patient scheduled for a Right/ Left heart cath on 12/09/21.  ? ?Patient was educated at bedside on the signs and symptoms of heart failure, daily weights, when to call her doctor or come to the ER. The importance of diet/ fluid restrictions, taking all medications as prescribed, and attending all medical appointments. We spoke about her desire to quit smoking, especially with her lung cancer  history. We discussed a hospital follow up in the HF [facility] and she agreed to come on 12/15/2021 @ 11am.  ? ?ECHO/ LVEF: 40-45% ? ?Clinical Course: ? ?Past Medical History:  ?Diagnosis Date  ? Anginal pain ([place])   ? Arthritis   ? Asthma   ? Coronary artery disease   ? Diabetes mellitus   ? Dyspnea   ? Early cataracts, bilateral   ? GERD (gastroesophageal reflux disease)   ? occ  ? Hypertension   ? Lung cancer ([place])   ? Mass of upper lobe of left lung   ? mediastinal adenopathy  ? Palpitations   ? Pneumonia   ? Stroke [facility])   ? mini stroke  ? Wears dentures   ? Wears glasses   ?  ? ?Social History  ? ?Socioeconomic History  ? Marital status: Widowed  ?  Spouse name: Not on file  ? Number of children: 6  ? Years of education: Not on file  ? Highest education level: High school graduate  ?Occupational History  ? Occupation: retired  ?  Comment: [employer].  ?Tobacco Use  ? Smoking status: Every Day  ?  Packs/day: 0.25  ?  Years: 12.00  ?  Pack years: 3.00  ?  Types: Cigarettes  ?  Last attempt to quit: 03/2017  ?  Years since quitting: 4.7  ? Smokeless tobacco: Never  ?Vaping Use  ? Vaping Use: Never used  ?Substance and Sexual Activity  ? Alcohol use: No  ? Drug use: No   ? Sexual activity: Not Currently  ?Other Topics Concern  ? Not on file  ?Social History Narrative  ? Right handed  ? ?Social Determinants of Health  ? ?Financial Resource Strain: Not on file  ?Food Insecurity: No Food Insecurity  ? Worried About Charity fundraiser in the Last Year: Never true  ? Ran Out of Food in the Last Year: Never true  ?Transportation Needs: No Transportation Needs  ? Lack of Transportation (Medical): No  ? Lack of Transportation (Non-Medical): No  ?Physical Activity: Not on file  ?Stress: Not on file  ?Social Connections: Not on file  ? ?Education Assessment and Provision: ? ?Detailed education and instructions provided on heart failure disease management including the following: ? ?Signs and symptoms of Heart Failure ?When to call the physician ?Importance of daily weights ?Low sodium diet ?Fluid restriction ?Medication management ?Anticipated future follow-up appointments ? ?Patient education given on each of the above topics.  Patient acknowledges understanding via teach back method and acceptance of all instructions. ? ?Education Materials:  "Living Better With Heart Failure" Booklet, HF zone tool, & Daily Weight Tracker Tool. ? ?Patient has scale at home: yes ?Patient has pill box at home: yes   ? ?High Risk  Criteria for Readmission and/or Poor Patient Outcomes: ?Heart failure hospital admissions (last 6 months): 1  ?No Show rate: 3 % ?Difficult social situation: No ?Demonstrates medication adherence: Yes ?Primary Language: English ?Literacy level: Reading, writing, and comprehension.  ? ?Barriers of Care:   ?Smoking cessation ?Diet/ fluid restrictions ? ?Considerations/Referrals:  ? ?Referral made to Heart Failure Pharmacist Stewardship: yes ?Referral made to Heart Failure CSW/NCM TOC: smoking cessation ?Referral made to Heart & Vascular TOC clinic: yes, 12/15/2021 @ 11 am.  ? ?Items for Follow-up on DC/TOC: ?Smoking cessation ?Diet/fluid restrictions ? ? ?Earnestine Leys, BSN, RN ?Heart  Failure Nurse Navigator ?Secure Chat Only   ?

## 2021-12-09 NOTE — Progress Notes (Signed)
Pt received from cath lab AxOx4, (R) groin C/D/I, level 0. Purewick in place, dinner order placed, all questions and concerns addressed, call bell placed within reach, will continue to monitor and maintain safety. ?

## 2021-12-09 NOTE — Progress Notes (Incomplete)
Heart Failure Stewardship Pharmacist Progress Note ? ? ?PCP: Willey Blade, MD ?PCP-Cardiologist: None  ? ? ?HPI:  ?*** ? ?Current HF Medications: ?{HF MEDS WIOXBDZ:32992} ? ?Prior to admission HF Medications: ?{HF MEDS PTA:26664} ? ?Pertinent Lab Values: ?Serum creatinine ***, BUN ***, Potassium ***, Sodium ***, BNP ***, Magnesium ***, A1c ***, Digoxin ***  ? ?Vital Signs: ?Weight: *** lbs (admission weight: *** lbs) ?Blood pressure: ***  ?Heart rate: ***  ?I/O: -***L yesterday; net -***L ? ?Medication Assistance / Insurance Benefits Check: ?Does the patient have prescription insurance?  {Yes/No/Pending:24180} ?Type of insurance plan: *** ? ?Does the patient qualify for medication assistance through manufacturers or grants?   {Yes/No/Pending:24180} ?Eligible grants and/or patient assistance programs: *** ?Medication assistance applications in progress: ***  ?Medication assistance applications approved: *** ?Approved medication assistance renewals will be completed by: *** ? ?Outpatient Pharmacy:  ?Prior to admission outpatient pharmacy: *** ?Is the patient willing to use [place] pharmacy at discharge? {Yes/No/Pending:24180} ?Is the patient willing to transition their outpatient pharmacy to utilize a [facility] outpatient pharmacy?   {Yes/No/Pending:24180} ?  ? ?Assessment: ?1. Acute on ***chronic ***systolic CHF (LVEF ***%), due to ***. NYHA class *** symptoms. ?-  ?  ?Plan: ?1) Medication changes recommended at this time: ?- ? ?2) Patient assistance: ?- ? ?3)  Education  ?- To be completed prior to discharge ? ?Kerby Nora, PharmD, BCPS ?Heart Failure Stewardship Pharmacist ?Phone [phone] ? ? ?

## 2021-12-10 ENCOUNTER — Encounter (HOSPITAL_COMMUNITY): Payer: Self-pay | Admitting: Cardiovascular Disease

## 2021-12-10 ENCOUNTER — Other Ambulatory Visit (HOSPITAL_COMMUNITY): Payer: Self-pay

## 2021-12-10 LAB — CBC
HCT: 33 % — ABNORMAL LOW (ref 36.0–46.0)
Hemoglobin: 11.1 g/dL — ABNORMAL LOW (ref 12.0–15.0)
MCH: 26.8 pg (ref 26.0–34.0)
MCHC: 33.6 g/dL (ref 30.0–36.0)
MCV: 79.7 fL — ABNORMAL LOW (ref 80.0–100.0)
Platelets: 229 10*3/uL (ref 150–400)
RBC: 4.14 MIL/uL (ref 3.87–5.11)
RDW: 14.8 % (ref 11.5–15.5)
WBC: 6.4 10*3/uL (ref 4.0–10.5)
nRBC: 0 % (ref 0.0–0.2)

## 2021-12-10 LAB — BASIC METABOLIC PANEL
Anion gap: 8 (ref 5–15)
BUN: 24 mg/dL — ABNORMAL HIGH (ref 8–23)
CO2: 26 mmol/L (ref 22–32)
Calcium: 8.7 mg/dL — ABNORMAL LOW (ref 8.9–10.3)
Chloride: 105 mmol/L (ref 98–111)
Creatinine, Ser: 1.28 mg/dL — ABNORMAL HIGH (ref 0.44–1.00)
GFR, Estimated: 41 mL/min — ABNORMAL LOW (ref >60)
Glucose, Bld: 106 mg/dL — ABNORMAL HIGH (ref 70–99)
Potassium: 3.8 mmol/L (ref 3.5–5.1)
Sodium: 139 mmol/L (ref 135–145)

## 2021-12-10 LAB — LIPID PANEL
Cholesterol: 108 mg/dL (ref 0–200)
HDL: 46 mg/dL (ref >40)
LDL Cholesterol: 50 mg/dL (ref 0–99)
Total CHOL/HDL Ratio: 2.3 RATIO
Triglycerides: 58 mg/dL (ref <150)
VLDL: 12 mg/dL (ref 0–40)

## 2021-12-10 MED ORDER — SPIRONOLACTONE 12.5 MG HALF TABLET
12.5000 mg | ORAL_TABLET | Freq: Every day | ORAL | Status: DC
  Administered 2021-12-10 – 2021-12-12 (×3): 12.5 mg via ORAL
  Filled 2021-12-10 (×3): qty 1

## 2021-12-10 MED FILL — Lidocaine HCl Local Preservative Free (PF) Inj 1%: INTRAMUSCULAR | Qty: 30 | Status: AC

## 2021-12-10 NOTE — Progress Notes (Signed)
Ref: Willey Blade, MD ? ? ?Subjective:  ?Awake. No chest pain.  ?VS stable. ?Creatinine is stable at 1.28 mg. ? ?Objective:  ?Vital Signs in the last 24 hours: ?Temp:  [98.3 ?F (36.8 ?C)-98.6 ?F (37 ?C)] 98.6 ?F (37 ?C) (05/11 0933) ?Pulse Rate:  [44-74] 67 (05/11 0933) ?Cardiac Rhythm: Junctional rhythm (05/10 2005) ?Resp:  [13-24] 18 (05/11 0933) ?BP: (113-191)/(46-93) 115/57 (05/11 0933) ?SpO2:  [99 %-100 %] 99 % (05/11 0933) ?Weight:  [69.4 kg] 69.4 kg (05/11 0436) ? ?Physical Exam: ?BP Readings from Last 1 Encounters:  ?12/10/21 (!) 115/57  ?   ?Wt Readings from Last 1 Encounters:  ?12/10/21 69.4 kg  ?  Weight change: -1.361 kg Body mass index is 22.59 kg/m?. ?HEENT: [place]/AT, Eyes-Brown, wears glasses, Conjunctiva-Pink, Sclera-Non-icteric ?Neck: No JVD, No bruit, Trachea midline. ?Lungs:  Clear, Bilateral. ?Cardiac:  Regular rhythm, normal S1 and S2, no S3. II/VI systolic murmur. ?Abdomen:  Soft, non-tender. BS present. ?Extremities:  No edema present. No cyanosis. No clubbing. Right groin cath site is stable, no discharge or hematoma. ?CNS: AxOx3, Cranial nerves grossly intact, moves all 4 extremities.  ?Skin: Warm and dry. ? ? ?Intake/Output from previous day: ?05/10 0701 - 05/11 0700 ?In: 170 [P.O.:170] ?Out: 2500 [Urine:2500] ? ? ? ?Lab Results: ?BMET ?   ?Component Value Date/Time  ? NA 139 12/10/2021 0209  ? NA 141 12/09/2021 1219  ? NA 141 12/09/2021 1212  ? NA 140 06/02/2017 1322  ? K 3.8 12/10/2021 0209  ? K 3.7 12/09/2021 1219  ? K 3.7 12/09/2021 1212  ? K 4.2 06/02/2017 1322  ? CL 105 12/10/2021 0209  ? CL 106 12/09/2021 0415  ? CL 109 12/08/2021 0420  ? CO2 26 12/10/2021 0209  ? CO2 29 12/09/2021 0415  ? CO2 24 12/08/2021 0420  ? CO2 20 (L) 06/02/2017 1322  ? GLUCOSE 106 (H) 12/10/2021 0209  ? GLUCOSE 109 (H) 12/09/2021 0415  ? GLUCOSE 100 (H) 12/08/2021 0420  ? GLUCOSE 131 06/02/2017 1322  ? BUN 24 (H) 12/10/2021 0209  ? BUN 24 (H) 12/09/2021 0415  ? BUN 23 12/08/2021 0420  ? BUN 22.9 06/02/2017  1322  ? CREATININE 1.28 (H) 12/10/2021 0209  ? CREATININE 1.30 (H) 12/09/2021 0415  ? CREATININE 1.39 (H) 12/08/2021 0420  ? CREATININE 1.28 (H) 07/08/2021 1452  ? CREATININE 1.28 (H) 07/08/2020 0950  ? CREATININE 1.16 (H) 07/09/2019 1012  ? CREATININE 1.2 (H) 06/02/2017 1322  ? CALCIUM 8.7 (L) 12/10/2021 0209  ? CALCIUM 8.6 (L) 12/09/2021 0415  ? CALCIUM 8.8 (L) 12/08/2021 0420  ? CALCIUM 9.3 06/02/2017 1322  ? GFRNONAA 41 (L) 12/10/2021 0209  ? GFRNONAA 41 (L) 12/09/2021 0415  ? GFRNONAA 37 (L) 12/08/2021 0420  ? GFRNONAA 41 (L) 07/08/2021 1452  ? GFRNONAA 42 (L) 07/08/2020 0950  ? GFRNONAA 44 (L) 07/09/2019 1012  ? GFRAA 51 (L) 07/09/2019 1012  ? GFRAA 58 (L) 12/10/2018 0250  ? GFRAA >60 12/09/2018 0658  ? GFRAA 44 (L) 12/08/2018 0231  ? GFRAA 53 (L) 06/05/2018 0815  ? ?CBC ?   ?Component Value Date/Time  ? WBC 6.4 12/10/2021 0209  ? RBC 4.14 12/10/2021 0209  ? HGB 11.1 (L) 12/10/2021 0209  ? HGB 11.0 (L) 07/08/2021 1452  ? HGB 10.0 (L) 06/02/2017 1322  ? HCT 33.0 (L) 12/10/2021 0209  ? HCT 31.7 (L) 06/02/2017 1322  ? PLT 229 12/10/2021 0209  ? PLT 275 07/08/2021 1452  ? PLT 208 06/02/2017 1322  ?  MCV 79.7 (L) 12/10/2021 0209  ? MCV 82.3 06/02/2017 1322  ? MCH 26.8 12/10/2021 0209  ? MCHC 33.6 12/10/2021 0209  ? RDW 14.8 12/10/2021 0209  ? RDW 15.4 (H) 06/02/2017 1322  ? LYMPHSABS 1.6 12/07/2021 2115  ? LYMPHSABS 1.8 06/02/2017 1322  ? MONOABS 0.4 12/07/2021 2115  ? MONOABS 0.3 06/02/2017 1322  ? EOSABS 0.1 12/07/2021 2115  ? EOSABS 0.4 06/02/2017 1322  ? BASOSABS 0.0 12/07/2021 2115  ? BASOSABS 0.0 06/02/2017 1322  ? ?HEPATIC Function Panel ?Recent Labs  ?  07/08/21 ?1452 12/07/21 ?2115  ?PROT 7.5 6.6  ? ?HEMOGLOBIN A1C ?No components found for: HGA1C,  MPG ?CARDIAC ENZYMES ?Lab Results  ?Component Value Date  ? CKTOTAL 75 06/09/2011  ? CKMB 2.4 06/09/2011  ? TROPONINI <0.30 06/09/2011  ? TROPONINI <0.30 06/08/2011  ? TROPONINI <0.30 06/08/2011  ? ?BNP ?No results for input(s): PROBNP in the last 8760  hours. ?TSH ?No results for input(s): TSH in the last 8760 hours. ?CHOLESTEROL ?Recent Labs  ?  08/31/21 ?0333  ?CHOL 102  ? ? ?Scheduled Meds: ? aspirin EC  81 mg Oral Daily  ? atorvastatin  20 mg Oral q1800  ? cholecalciferol  5,000 Units Oral Daily  ? clopidogrel  75 mg Oral Q breakfast  ? diltiazem  60 mg Oral BID  ? furosemide  40 mg Oral Daily  ? gabapentin  100 mg Oral QHS  ? heparin  5,000 Units Subcutaneous Q8H  ? hydrALAZINE  10 mg Intravenous Once  ? hydrALAZINE  25 mg Oral BID  ? irbesartan  75 mg Oral Daily  ? metoprolol succinate  25 mg Oral QPM  ? mometasone-formoterol  2 puff Inhalation BID  ? pantoprazole  40 mg Oral Daily  ? potassium chloride  10 mEq Oral Daily  ? sodium chloride flush  3 mL Intravenous Q12H  ? sodium chloride flush  3 mL Intravenous Q12H  ? sodium chloride flush  3 mL Intravenous Q12H  ? ?Continuous Infusions: ? sodium chloride    ? sodium chloride    ? ?PRN Meds:.sodium chloride, sodium chloride, acetaminophen, albuterol, nitroGLYCERIN, ondansetron (ZOFRAN) IV, sodium chloride flush, sodium chloride flush ? ?Assessment/Plan: ? Acute systolic left heart failure ?Mild Pulmonary hypertension ?COPD ?Emphysema ?HTN ?HLD ?Type 2 DM ?CAD ?CKD, II ? ?Plan: ?Awaiting RCA stenting tomorrow. ?Check Lipid panel. ? ? LOS: 3 days  ? ?Time spent including chart review, lab review, examination, discussion with patient/Family/Nurse : 30 min ? ? ?Dixie Dials  MD  ?12/10/2021, 10:18 AM ? ? ? ? ?

## 2021-12-10 NOTE — Progress Notes (Signed)
CARDIAC REHAB PHASE I  ? ?PRE:  Rate/Rhythm: 68 SR ? ?  BP: sitting 108/47 ? ?  SaO2: 100 RA ? ?MODE:  Ambulation: 250 ft  ? ?POST:  Rate/Rhythm: 80 SR with PACs ? ?  BP: sitting 124/94  ? ?  SaO2: 100 RA ? ?Pt moved out of bed independently and walked with her cane, slow pace, standby assist. No major c/o. To recliner, VSS. Left DM and smoking information plus stent card. Will f/u. ?1420-1501  ? ?Yves Dill CES, ACSM ?12/10/2021 ?3:01 PM ? ? ? ? ?

## 2021-12-10 NOTE — Care Management Important Message (Signed)
Important Message ? ?Patient Details  ?Name: [name] [name] ?MRN: [id number] ?Date of Birth: April 08, 1935 ? ? ?Medicare Important Message Given:  Yes ? ? ? ? ?Shelda Altes ?12/10/2021, 10:26 AM ?

## 2021-12-10 NOTE — TOC Benefit Eligibility Note (Signed)
Patient Advocate Encounter ? ?Insurance verification completed.   ? ?The patient is currently admitted and upon discharge could be taking Brilitna 90 mg. ? ?The current 30 day co-pay is, $10.35.  ? ?The patient is insured through [employer] Part D  ? ? ? ?Lyndel Safe, CPhT ?Pharmacy Patient Advocate Specialist ?[facility] Patient Advocate Team ?Direct Number: [phone]  Fax: [phone] ? ? ? ? ? ?  ?

## 2021-12-10 NOTE — Progress Notes (Signed)
Heart Failure Stewardship Pharmacist Progress Note ? ? ?PCP: Willey Blade, MD ?PCP-Cardiologist: None  ? ? ?HPI:  ?87 yo female with PMH of COPD emphysema, HTN, HLD, T2DM, CAD, CHF, lung cancer s/p L upper lobe surgery and stroke. Presented 05/08 with persistent SOB, chest pressure and LEE. EKG with sinus bradycardia. CXR showed no overt edema, stable mild cardiomegaly and emphysema. Echo 05/09 with LVEF 40-45%, reduced from 65% in 08/2021. Echo this admission also showed mild LVH, G1DD, normal RV, severely elevated pulmonary pressures, severe biatrial dilation, and mild calcification of aortic valve (no regurg). ? ?Notable PTA medications/historical medications include - diltiazem 60 mg BID, recent fill history of pioglitazone 30 mg daily 09/2021 but not reconciled on med history, and history of isosobide dinitrate 20 mg twice daily on medication list at 08/18/21 neurology appointment but no recent fill history.  ? ?Current HF Medications: ?Diuretic: furosemide 40 mg PO daily ?Beta Blocker: metoprolol succinate 25 mg qHS ?ACE/ARB/ARNI: irbesartan 75 mg daily ?Other: hydralazine 25 mg twice daily, potassium 10 mEq daily ? ?Prior to admission HF Medications: ?Diuretic: torsemide 20 mg daily ?Beta blocker: metoprolol succinate 25 mg qHS ?ACE/ARB/ARNI: valsartan 160 mg daily ?Other: potassium 10 mEq daily ? ?Pertinent Lab Values: ?Serum creatinine 1.28, BUN 24, Potassium 3.8, Sodium 139, BNP 786.6, Magnesium (f/u tomorrow), A1c 6.1% ? ?Vital Signs: ?Weight: 153 lbs (admission weight: 164 lbs) ?Blood pressure: 120s/50s  ?Heart rate: 60s (NSR)  ?I/O: -3L yesterday; net -3.9L ? ?Medication Assistance / Insurance Benefits Check: ?Does the patient have prescription insurance?  Yes ?Type of insurance plan: Medicare ? ?Outpatient Pharmacy:  ?Prior to admission outpatient pharmacy: [facility]  ?Is the patient willing to use [place] pharmacy at discharge? Pending ?Is the patient willing to transition their outpatient  pharmacy to utilize a [facility] outpatient pharmacy?   Pending ?  ? ?Assessment: ?1. Acute on chronic HFmrEF (LVEF 40-45%). NYHA class II symptoms. ?- Continue furosemide 40 mg PO daily ?- Continue irbesartan 75 mg daily ?- Continue metoprolol succinate 25 mg qHS ?- Continue hydralazine 25 mg twice daily ?- Continue potassium 10 mEq daily ?  ?Plan: ?1) Medication changes recommended at this time: ?- Start spironolactone 12.5 mg daily ?- Pioglitazone recently filled 10/22/21. Ensure patient is no longer taking (not on med history) ?- F/u with patient if taking nitrate with hydralazine 25 mg twice daily (no fill history, was on med list for some office visits) ?- Consider switching irbesartan to Entresto if BP allows (diastolic in 75T today) ? ?2) Patient assistance: ?- pending patient discussion ? ?3)  Education  ?- To be completed prior to discharge ? ?Laurey Arrow, PharmD ?PGY1 Pharmacy Resident ?12/10/2021  5:08 PM ?

## 2021-12-11 ENCOUNTER — Encounter (HOSPITAL_COMMUNITY)
Disposition: A | Discharge status: Home / Self Care | Payer: Self-pay | Source: Home / Self Care | Attending: Cardiovascular Disease

## 2021-12-11 ENCOUNTER — Ambulatory Visit (HOSPITAL_COMMUNITY): Admission: RE | Admit: 2021-12-11 | Payer: Medicare PPO | Source: Home / Self Care | Admitting: Cardiology

## 2021-12-11 DIAGNOSIS — I5031 Acute diastolic (congestive) heart failure: Secondary | ICD-10-CM

## 2021-12-11 DIAGNOSIS — Z955 Presence of coronary angioplasty implant and graft: Secondary | ICD-10-CM | POA: Diagnosis not present

## 2021-12-11 DIAGNOSIS — I25110 Atherosclerotic heart disease of native coronary artery with unstable angina pectoris: Secondary | ICD-10-CM | POA: Diagnosis not present

## 2021-12-11 HISTORY — PX: CORONARY ATHERECTOMY: CATH118238

## 2021-12-11 HISTORY — PX: CORONARY THROMBECTOMY: CATH118304

## 2021-12-11 LAB — CBC
HCT: 32.6 % — ABNORMAL LOW (ref 36.0–46.0)
Hemoglobin: 10.6 g/dL — ABNORMAL LOW (ref 12.0–15.0)
MCH: 26.6 pg (ref 26.0–34.0)
MCHC: 32.5 g/dL (ref 30.0–36.0)
MCV: 81.9 fL (ref 80.0–100.0)
Platelets: 226 10*3/uL (ref 150–400)
RBC: 3.98 MIL/uL (ref 3.87–5.11)
RDW: 15 % (ref 11.5–15.5)
WBC: 5.3 10*3/uL (ref 4.0–10.5)
nRBC: 0 % (ref 0.0–0.2)

## 2021-12-11 LAB — BASIC METABOLIC PANEL
Anion gap: 10 (ref 5–15)
Anion gap: 9 (ref 5–15)
BUN: 34 mg/dL — ABNORMAL HIGH (ref 8–23)
BUN: 32 mg/dL — ABNORMAL HIGH (ref 8–23)
CO2: 24 mmol/L (ref 22–32)
CO2: 24 mmol/L (ref 22–32)
Calcium: 8.2 mg/dL — ABNORMAL LOW (ref 8.9–10.3)
Calcium: 8.7 mg/dL — ABNORMAL LOW (ref 8.9–10.3)
Chloride: 106 mmol/L (ref 98–111)
Chloride: 105 mmol/L (ref 98–111)
Creatinine, Ser: 1.83 mg/dL — ABNORMAL HIGH (ref 0.44–1.00)
Creatinine, Ser: 1.78 mg/dL — ABNORMAL HIGH (ref 0.44–1.00)
GFR, Estimated: 27 mL/min — ABNORMAL LOW (ref >60)
GFR, Estimated: 28 mL/min — ABNORMAL LOW (ref >60)
Glucose, Bld: 111 mg/dL — ABNORMAL HIGH (ref 70–99)
Glucose, Bld: 124 mg/dL — ABNORMAL HIGH (ref 70–99)
Potassium: 4.5 mmol/L (ref 3.5–5.1)
Potassium: 4.4 mmol/L (ref 3.5–5.1)
Sodium: 140 mmol/L (ref 135–145)
Sodium: 138 mmol/L (ref 135–145)

## 2021-12-11 LAB — POCT ACTIVATED CLOTTING TIME
Activated Clotting Time: 323 seconds
Activated Clotting Time: 287 seconds
Activated Clotting Time: 299 seconds
Activated Clotting Time: 299 seconds

## 2021-12-11 LAB — MAGNESIUM: Magnesium: 1.9 mg/dL (ref 1.7–2.4)

## 2021-12-11 LAB — LIPOPROTEIN A (LPA): Lipoprotein (a): 98.2 nmol/L — ABNORMAL HIGH (ref <75.0)

## 2021-12-11 SURGERY — CORONARY ATHERECTOMY
Anesthesia: LOCAL

## 2021-12-11 MED ORDER — HEPARIN (PORCINE) IN NACL 1000-0.9 UT/500ML-% IV SOLN
INTRAVENOUS | Status: DC | PRN
  Administered 2021-12-11 (×2): 500 mL

## 2021-12-11 MED ORDER — SODIUM CHLORIDE 0.9 % IV SOLN
INTRAVENOUS | Status: DC
Start: 2021-12-11 — End: 2021-12-13
  Administered 2021-12-12: 100 mL via INTRAVENOUS

## 2021-12-11 MED ORDER — SODIUM CHLORIDE 0.9% FLUSH: 3.0000 mL | INTRAVENOUS | Status: DC | PRN

## 2021-12-11 MED ORDER — SODIUM CHLORIDE 0.9 % IV SOLN
5.0000 mg/kg | Freq: Once | INTRAVENOUS | Status: DC
  Filled 2021-12-11: qty 13.82

## 2021-12-11 MED ORDER — MIDAZOLAM HCL 2 MG/2ML IJ SOLN
INTRAMUSCULAR | Status: DC | PRN
  Administered 2021-12-11 (×2): 1 mg via INTRAVENOUS

## 2021-12-11 MED ORDER — FENTANYL CITRATE (PF) 100 MCG/2ML IJ SOLN
INTRAMUSCULAR | Status: AC
  Filled 2021-12-11: qty 2

## 2021-12-11 MED ORDER — LIDOCAINE HCL (PF) 1 % IJ SOLN
INTRAMUSCULAR | Status: AC
  Filled 2021-12-11: qty 30

## 2021-12-11 MED ORDER — IOHEXOL 350 MG/ML SOLN
INTRAVENOUS | Status: DC | PRN
Start: 2021-12-11 — End: 2021-12-11
  Administered 2021-12-11: 80 mL

## 2021-12-11 MED ORDER — FENTANYL CITRATE (PF) 100 MCG/2ML IJ SOLN
INTRAMUSCULAR | Status: DC | PRN
  Administered 2021-12-11 (×2): 25 ug via INTRAVENOUS

## 2021-12-11 MED ORDER — VERAPAMIL HCL 2.5 MG/ML IV SOLN
INTRAVENOUS | Status: AC
  Filled 2021-12-11: qty 2

## 2021-12-11 MED ORDER — SODIUM CHLORIDE 0.9 % IV SOLN: INTRAVENOUS | Status: AC

## 2021-12-11 MED ORDER — HYDRALAZINE HCL 20 MG/ML IJ SOLN
INTRAMUSCULAR | Status: DC | PRN
  Administered 2021-12-11: 10 mg via INTRAVENOUS

## 2021-12-11 MED ORDER — SODIUM CHLORIDE 0.9 % IV SOLN
INTRAVENOUS | Status: DC | PRN
  Administered 2021-12-11: 345.5 mg via INTRAVENOUS

## 2021-12-11 MED ORDER — HEPARIN (PORCINE) IN NACL 1000-0.9 UT/500ML-% IV SOLN
INTRAVENOUS | Status: AC
  Filled 2021-12-11: qty 1000

## 2021-12-11 MED ORDER — MAGNESIUM SULFATE 2 GM/50ML IV SOLN
2.0000 g | Freq: Once | INTRAVENOUS | Status: AC
  Administered 2021-12-11: 2 g via INTRAVENOUS
  Filled 2021-12-11: qty 50

## 2021-12-11 MED ORDER — ASPIRIN 81 MG PO CHEW
81.0000 mg | CHEWABLE_TABLET | ORAL | Status: AC
  Administered 2021-12-11: 81 mg via ORAL
  Filled 2021-12-11: qty 1

## 2021-12-11 MED ORDER — HEPARIN SODIUM (PORCINE) 5000 UNIT/ML IJ SOLN
5000.0000 [IU] | Freq: Three times a day (TID) | INTRAMUSCULAR | Status: DC
  Administered 2021-12-11 – 2021-12-12 (×4): 5000 [IU] via SUBCUTANEOUS
  Filled 2021-12-11 (×3): qty 1

## 2021-12-11 MED ORDER — LIDOCAINE HCL (PF) 1 % IJ SOLN
INTRAMUSCULAR | Status: DC | PRN
  Administered 2021-12-11: 5 mL

## 2021-12-11 MED ORDER — MIDAZOLAM HCL 2 MG/2ML IJ SOLN
INTRAMUSCULAR | Status: AC
  Filled 2021-12-11: qty 2

## 2021-12-11 MED ORDER — VERAPAMIL HCL 2.5 MG/ML IV SOLN
INTRAVENOUS | Status: DC | PRN
  Administered 2021-12-11: 10 mL via INTRA_ARTERIAL

## 2021-12-11 MED ORDER — HYDRALAZINE HCL 20 MG/ML IJ SOLN: 10.0000 mg | INTRAMUSCULAR | Status: AC | PRN

## 2021-12-11 MED ORDER — SODIUM CHLORIDE 0.9 % IV SOLN: INTRAVENOUS | Status: DC

## 2021-12-11 MED ORDER — NITROGLYCERIN 1 MG/10 ML FOR IR/CATH LAB
INTRA_ARTERIAL | Status: AC
  Filled 2021-12-11: qty 10

## 2021-12-11 MED ORDER — SODIUM CHLORIDE 0.9 % IV SOLN: 250.0000 mL | INTRAVENOUS | Status: DC | PRN

## 2021-12-11 MED ORDER — SODIUM CHLORIDE 0.9% FLUSH
3.0000 mL | Freq: Two times a day (BID) | INTRAVENOUS | Status: DC
  Administered 2021-12-11 – 2021-12-12 (×3): 3 mL via INTRAVENOUS

## 2021-12-11 MED ORDER — HEPARIN SODIUM (PORCINE) 1000 UNIT/ML IJ SOLN
INTRAMUSCULAR | Status: AC
  Filled 2021-12-11: qty 10

## 2021-12-11 MED ORDER — SODIUM CHLORIDE 0.9% FLUSH
3.0000 mL | Freq: Two times a day (BID) | INTRAVENOUS | Status: DC
  Administered 2021-12-11: 3 mL via INTRAVENOUS

## 2021-12-11 MED ORDER — NITROGLYCERIN 1 MG/10 ML FOR IR/CATH LAB
INTRA_ARTERIAL | Status: DC | PRN
  Administered 2021-12-11 (×2): 200 ug via INTRACORONARY

## 2021-12-11 MED ORDER — LABETALOL HCL 5 MG/ML IV SOLN: 10.0000 mg | INTRAVENOUS | Status: AC | PRN

## 2021-12-11 MED ORDER — HEPARIN SODIUM (PORCINE) 1000 UNIT/ML IJ SOLN
INTRAMUSCULAR | Status: DC | PRN
Start: 2021-12-11 — End: 2021-12-11
  Administered 2021-12-11: 2000 [IU] via INTRAVENOUS
  Administered 2021-12-11: 7000 [IU] via INTRAVENOUS

## 2021-12-11 MED ORDER — FUROSEMIDE 40 MG PO TABS
40.0000 mg | ORAL_TABLET | Freq: Every day | ORAL | Status: DC
  Administered 2021-12-12: 40 mg via ORAL
  Filled 2021-12-11: qty 1

## 2021-12-11 SURGICAL SUPPLY — 29 items
BALL SAPPHIRE NC24 3.0X15 (BALLOONS) ×2
BALL SAPPHIRE NC24 3.5X15 (BALLOONS) ×2
BALLN SAPPHIRE 2.5X12 (BALLOONS) ×2
BALLOON SAPPHIRE 2.5X12 (BALLOONS) IMPLANT
BALLOON SAPPHIRE NC24 3.0X15 (BALLOONS) IMPLANT
BALLOON SAPPHIRE NC24 3.5X15 (BALLOONS) IMPLANT
CATH INFINITI JR4 5F (CATHETERS) ×1 IMPLANT
CATH LAUNCHER 6FR AL.75 (CATHETERS) ×1 IMPLANT
CATH OPTICROSS HD (CATHETERS) ×1 IMPLANT
CATH TELEPORT (CATHETERS) ×1 IMPLANT
CATH TELESCOPE 6F GEC (CATHETERS) ×1 IMPLANT
CROWN DIAMONDBACK CLASSIC 1.25 (BURR) ×1 IMPLANT
ELECT DEFIB PAD ADLT CADENCE (PAD) ×1 IMPLANT
GLIDESHEATH SLEND SS 6F .021 (SHEATH) ×1 IMPLANT
GUIDEWIRE INQWIRE 1.5J.035X260 (WIRE) IMPLANT
INQWIRE 1.5J .035X260CM (WIRE) ×2
KIT ENCORE 26 ADVANTAGE (KITS) ×1 IMPLANT
KIT HEART LEFT (KITS) ×2 IMPLANT
LUBRICANT VIPERSLIDE CORONARY (MISCELLANEOUS) ×1 IMPLANT
PACK CARDIAC CATHETERIZATION (CUSTOM PROCEDURE TRAY) ×2 IMPLANT
SLED PULL BACK IVUS (MISCELLANEOUS) ×1 IMPLANT
STENT SYNERGY XD 2.50X32 (Permanent Stent) IMPLANT
STENT SYNERGY XD 3.0X28 (Permanent Stent) IMPLANT
SYNERGY XD 2.50X32 (Permanent Stent) ×2 IMPLANT
SYNERGY XD 3.0X28 (Permanent Stent) ×2 IMPLANT
TRANSDUCER W/STOPCOCK (MISCELLANEOUS) ×2 IMPLANT
TUBING CIL FLEX 10 FLL-RA (TUBING) ×2 IMPLANT
WIRE RUNTHROUGH .014X300CM (WIRE) ×1 IMPLANT
WIRE VIPERWIRE COR FLEX .012 (WIRE) ×1 IMPLANT

## 2021-12-11 NOTE — Consult Note (Addendum)
? ?INTERVENTIONAL CARDIOLOGY CONSULTATION:  ? ?Patient ID: [name] [name] ?MRN: [id number]; DOB: 1935-01-23 ? ?Admit date: 12/07/2021 ?Date of Consult: 12/11/2021 ? ?PCP:  Willey Blade, MD ?Primary Cardiologist: Dr. Doylene Canard ?[facility]Care[facility]iologist:  None      ? ? ?Patient Profile:  ? ?[name] [name] is a 87 y.o. female with a hx of COPD (history of lung cancer s/p LUL lobectomy), history of CVA, HTN, HLD, DM-2, CKD 3A-B with recently diagnosed acute HFmrEF (EF down to 40 and 45% from 60%) with two-vessel CAD noted on Right and Left Heart Cath.  She had DES PCI performed on the LAD by Dr. Kathlyn Sacramento on the same day, with plan for staged atherectomy based PCI of extensive lesion in the RCA. ?She is being seen 12/11/2021 for the evaluation of severe cal the case cified RCA disease with plan for staged PCI at the request of Dr. Doylene Canard. ? ?History of Present Illness:  ? ?[name] [name] was admitted on the evening of 12/07/2021 by Dr. Doylene Canard with complaint of 2 weeks of chest pressure, leg edema and dyspnea.  Chest x-ray suggested cardiomegaly along with emphysema.  EKG use only showed bradycardia. ?She was admitted for IV Lasix diuresis. ?Echocardiogram revealed reduced EF of 40 to 45% down from 65% on previous echo.  Both atria are severely dilated.  She had mild to moderate MR with severe TR.  Functionally bicuspid aortic valve with thickening but no obvious stenosis. ? ?To evaluate chest pain with reduced EF, she underwent right heart cath on 12/09/2021 revealing severe LAD and extensive calcified disease in the RCA. ? ?Past Medical History:  ?Diagnosis Date  ? Anginal pain ([place])   ? Arthritis   ? Asthma   ? Coronary artery disease   ? Diabetes mellitus   ? Dyspnea   ? Early cataracts, bilateral   ? GERD (gastroesophageal reflux disease)   ? occ  ? Hypertension   ? Lung cancer ([place])   ? Mass of upper lobe of left lung   ? mediastinal adenopathy  ? Palpitations   ? Pneumonia   ? Stroke [facility])   ? mini stroke  ?  Wears dentures   ? Wears glasses   ? ? ?Past Surgical History:  ?Procedure Laterality Date  ? ABDOMINAL HYSTERECTOMY    ? ANKLE SURGERY Left   ? fusion  ? COLONOSCOPY W/ BIOPSIES AND POLYPECTOMY    ? CORONARY STENT INTERVENTION N/A 12/09/2021  ? Procedure: CORONARY STENT INTERVENTION;  Surgeon: Wellington Hampshire, MD;  Location: [place] CV [facility];  Service: Cardiovascular;  Laterality: N/A;  ? IR ANGIO EXTRACRAN SEL COM CAROTID INNOMINATE UNI BILAT MOD SED  11/30/2018  ? IR ANGIO VERTEBRAL SEL VERTEBRAL BILAT MOD SED  11/30/2018  ? IR ANGIOGRAM SELECTIVE EACH ADDITIONAL VESSEL  11/30/2018  ? MULTIPLE TOOTH EXTRACTIONS    ? RIGHT/LEFT HEART CATH AND CORONARY ANGIOGRAPHY N/A 12/09/2021  ? Procedure: RIGHT/LEFT HEART CATH AND CORONARY ANGIOGRAPHY;  Surgeon: Dixie Dials, MD;  Location: [place] CV [facility];  Service: Cardiovascular;  Laterality: N/A;  ? TOTAL KNEE ARTHROPLASTY Right   ? VIDEO ASSISTED THORACOSCOPY (VATS)/ LOBECTOMY Left 04/21/2017  ? Procedure: VIDEO ASSISTED THORACOSCOPY (VATS)/LEFT UPPER LOBECTOMY;  Surgeon: Melrose Nakayama, MD;  Location: [place];  Service: Thoracic;  Laterality: Left;  ? VIDEO BRONCHOSCOPY WITH ENDOBRONCHIAL NAVIGATION N/A 01/21/2017  ? Procedure: VIDEO BRONCHOSCOPY WITH ENDOBRONCHIAL NAVIGATION;  Surgeon: Melrose Nakayama, MD;  Location: [place];  Service: Thoracic;  Laterality: N/A;  ? VIDEO  BRONCHOSCOPY WITH ENDOBRONCHIAL ULTRASOUND N/A 01/21/2017  ? Procedure: VIDEO BRONCHOSCOPY WITH ENDOBRONCHIAL ULTRASOUND;  Surgeon: Melrose Nakayama, MD;  Location: [place];  Service: Thoracic;  Laterality: N/A;  ?  ? ?Home Medications:  ?Prior to Admission medications   ?Medication Sig Start Date End Date Taking? Authorizing Provider  ?acetaminophen (TYLENOL) 500 MG tablet Take 1,000 mg by mouth every 6 (six) hours as needed for mild pain or headache.   Yes [provider]  ?atorvastatin (LIPITOR) 20 MG tablet Take 20 mg by mouth daily at 6 PM.  11/16/17  Yes [provider]  ?Cholecalciferol (VITAMIN D3) 125 MCG (5000 UT) TABS Take 5,000 Units by mouth daily.    Yes [provider]  ?diclofenac Sodium (VOLTAREN) 1 % GEL Apply 4 g topically 4 (four) times daily as needed. ?Patient taking differently: Apply 4 g topically 4 (four) times daily as needed (pain). 01/13/21  Yes Hilts, Legrand Como, MD  ?diltiazem (CARDIZEM) 60 MG tablet Take 60 mg by mouth 2 (two) times daily. 12/02/21  Yes [provider]  ?ELIQUIS 2.5 MG TABS tablet Take 2.5 mg by mouth 2 (two) times daily. 11/10/21  Yes [provider]  ?hydrALAZINE (APRESOLINE) 25 MG tablet Take 25 mg by mouth 2 (two) times daily.    Yes [provider]  ?metFORMIN (GLUCOPHAGE) 500 MG tablet Take 500 mg by mouth 2 (two) times daily.    Yes [provider]  ?methocarbamol (ROBAXIN) 500 MG tablet Take 1 tablet (500 mg total) by mouth every 8 (eight) hours as needed for muscle spasms. ?Patient taking differently: Take 500 mg by mouth daily as needed for muscle spasms. 07/30/21  Yes Persons, Bevely Palmer, PA  ?metoprolol succinate (TOPROL-XL) 25 MG 24 hr tablet Take 25 mg by mouth every evening.    Yes [provider]  ?nitroGLYCERIN (NITROSTAT) 0.4 MG SL tablet Place 0.4 mg under the tongue every 5 (five) minutes as needed for chest pain.   Yes [provider]  ?omeprazole (PRILOSEC) 20 MG capsule Take 20 mg by mouth daily. 11/02/18  Yes [provider]  ?potassium chloride (KLOR-CON M) 10 MEQ tablet Take 10 mEq by mouth daily. 12/02/21  Yes [provider]  ?torsemide (DEMADEX) 20 MG tablet Take 20 mg by mouth daily. 12/02/21  Yes [provider]  ?valsartan (DIOVAN) 160 MG tablet Take 160 mg by mouth daily.  11/29/17  Yes [provider]  ?gabapentin (NEURONTIN) 100 MG capsule Take 1 capsule (100 mg total) by mouth at bedtime. ?Patient not taking: Reported on 12/08/2021 08/18/21   Pieter Partridge, DO  ? ? ?Inpatient Medications: ?Scheduled Meds: ? [MAR Hold]  aspirin EC  81 mg Oral Daily  ? [MAR Hold] atorvastatin  20 mg Oral q1800  ? [MAR Hold] cholecalciferol  5,000 Units Oral Daily  ? [MAR Hold] clopidogrel  75 mg Oral Q breakfast  ? [MAR Hold] diltiazem  60 mg Oral BID  ? [MAR Hold] furosemide  40 mg Oral Daily  ? [MAR Hold] gabapentin  100 mg Oral QHS  ? [MAR Hold] heparin  5,000 Units Subcutaneous Q8H  ? [MAR Hold] hydrALAZINE  10 mg Intravenous Once  ? [MAR Hold] irbesartan  75 mg Oral Daily  ? [MAR Hold] metoprolol succinate  25 mg Oral QPM  ? [MAR Hold] mometasone-formoterol  2 puff Inhalation BID  ? [MAR Hold] pantoprazole  40 mg Oral Daily  ? [MAR Hold] sodium chloride flush  3 mL Intravenous Q12H  ? [  MAR Hold] sodium chloride flush  3 mL Intravenous Q12H  ? [MAR Hold] sodium chloride flush  3 mL Intravenous Q12H  ? sodium chloride flush  3 mL Intravenous Q12H  ? [MAR Hold] spironolactone  12.5 mg Oral Daily  ? ?Continuous Infusions: ? [MAR Hold] sodium chloride    ? [facility] Hold] sodium chloride    ? sodium chloride    ? sodium chloride 50 mL/hr at 12/11/21 0521  ? sodium chloride 100 mL/hr at 12/11/21 1017  ? [MAR Hold] aminophylline (Cath Lab Atherectomy) bolus    ? ?PRN Meds: ?[MAR Hold] sodium chloride, [MAR Hold] sodium chloride, sodium chloride, [MAR Hold] acetaminophen, [MAR Hold] albuterol, [MAR Hold] nitroGLYCERIN, [MAR Hold] ondansetron (ZOFRAN) IV, [MAR Hold] sodium chloride flush, [MAR Hold] sodium chloride flush, sodium chloride flush ? ?Allergies:    ?Allergies  ?Allergen Reactions  ? Penicillins Rash and Other (See Comments)  ?  PATIENT HAS HAD A PCN REACTION WITH IMMEDIATE RASH, FACIAL/TONGUE/THROAT SWELLING, SOB, OR LIGHTHEADEDNESS WITH HYPOTENSION:  #  #  #  YES  #  #  #  ?Has patient had a PCN reaction causing severe rash involving mucus membranes or skin necrosis: NO ?Has patient had a PCN reaction that required hospitalization NO ?Has patient had a PCN reaction occurring within the last 10 years: NO  ? Tramadol Itching  ? Codeine Rash   ? ? ?Social History:   ?reports that she quit smoking about 4 years ago. Her smoking use included cigarettes. She has a 3.00 pack-year smoking history. She has never used smokeless tobacco. She reports that she d

## 2021-12-11 NOTE — Progress Notes (Signed)
Heart Failure Stewardship Pharmacist Progress Note ? ? ?PCP: Willey Blade, MD ?PCP-Cardiologist: None  ? ? ?HPI:  ?87 yo female with PMH of COPD emphysema, HTN, HLD, T2DM, CAD, CHF, lung cancer s/p L upper lobe surgery and stroke. Presented 05/08 with persistent SOB, chest pressure and LEE. EKG with sinus bradycardia. CXR showed no overt edema, stable mild cardiomegaly and emphysema. Echo 05/09 with LVEF 40-45%, reduced from 65% in 08/2021. Echo this admission also showed mild LVH, G1DD, normal RV, severely elevated pulmonary pressures, severe biatrial dilation, and mild calcification of aortic valve (no regurg). Underwent [facility] 05/10 showing severe RCA  and LAD disease (90%), mildly elevated LVEDP and mild pulmonary HTN (PAP 60/15, mean 33). Stented LAD, no interventions to RCA. Plan for PCI to RCA on 05/12.  ? ?Notable PTA medications/historical medications include - diltiazem 60 mg BID, recent fill history of pioglitazone 30 mg daily 09/2021 but not reconciled on med history, and history of isosobide dinitrate 20 mg twice daily on medication list at 08/18/21 neurology appointment but no recent fill history.  ? ?Current HF Medications: ?Diuretic: furosemide 40 mg PO daily ?Beta Blocker: metoprolol succinate 25 mg qHS ?ACE/ARB/ARNI: irbesartan 75 mg daily ?Other: hydralazine 25 mg twice daily, potassium 10 mEq daily ? ?Prior to admission HF Medications: ?Diuretic: torsemide 20 mg daily ?Beta blocker: metoprolol succinate 25 mg qHS ?ACE/ARB/ARNI: valsartan 160 mg daily ?Other: potassium 10 mEq daily ? ?Pertinent Lab Values: ?Serum creatinine 1.83 (up), BUN 24, Potassium 4.5, Sodium 140, BNP 786.6 (05/08), Magnesium 1.9, A1c 6.1% ? ?Vital Signs: ?Weight: 153 lbs (admission weight: 164 lbs) ?Blood pressure: 110s/50s  ?Heart rate: 50s (NSR)  ?I/O: -3L yesterday; net -3.9L ? ?Medication Assistance / Insurance Benefits Check: ?Does the patient have prescription insurance?  Yes ?Type of insurance plan:  Medicare ? ?Outpatient Pharmacy:  ?Prior to admission outpatient pharmacy: [facility]  ?Is the patient willing to use [place] pharmacy at discharge? Pending ?Is the patient willing to transition their outpatient pharmacy to utilize a [facility] outpatient pharmacy?   Pending ?  ? ?Assessment: ?1. Acute on chronic HFmrEF (LVEF 40-45%). NYHA class II symptoms. ?- Continue furosemide 40 mg PO daily ?- Continue irbesartan 75 mg daily ?- Continue metoprolol succinate 25 mg qHS ?- Continue spironolactone 12.5 mg daily ?  ?Plan: ?1) Medication changes recommended at this time: ?- Stop hydralazine to allow for initiation of SGLTi or ARNI to optimize GDMT ?- Consider Jardiance 10 mg when renal function improves (eGFR 27 today) ?- Stop potassium 10 mEq daily ?- Give Mg IV 2g x1 ?- Pioglitazone recently filled 10/22/21. Ensure patient is no longer taking (not on med history) ?- F/u with patient if taking nitrate with hydralazine 25 mg twice daily (no fill history, was on med list for some office visits) ?- Consider switching irbesartan to Entresto if BP allows (diastolic in 56D today) ? ?2) Patient assistance: ?- pending patient discussion ? ?3)  Education  ?- To be completed prior to discharge ? ?Laurey Arrow, PharmD ?PGY1 Pharmacy Resident ?12/11/2021  8:50 AM ?

## 2021-12-12 LAB — CBC
HCT: 31.9 % — ABNORMAL LOW (ref 36.0–46.0)
Hemoglobin: 10.2 g/dL — ABNORMAL LOW (ref 12.0–15.0)
MCH: 26.7 pg (ref 26.0–34.0)
MCHC: 32 g/dL (ref 30.0–36.0)
MCV: 83.5 fL (ref 80.0–100.0)
Platelets: 201 10*3/uL (ref 150–400)
RBC: 3.82 MIL/uL — ABNORMAL LOW (ref 3.87–5.11)
RDW: 15.1 % (ref 11.5–15.5)
WBC: 6.5 10*3/uL (ref 4.0–10.5)
nRBC: 0 % (ref 0.0–0.2)

## 2021-12-12 LAB — BASIC METABOLIC PANEL
Anion gap: 6 (ref 5–15)
BUN: 36 mg/dL — ABNORMAL HIGH (ref 8–23)
CO2: 23 mmol/L (ref 22–32)
Calcium: 7.8 mg/dL — ABNORMAL LOW (ref 8.9–10.3)
Chloride: 108 mmol/L (ref 98–111)
Creatinine, Ser: 1.81 mg/dL — ABNORMAL HIGH (ref 0.44–1.00)
GFR, Estimated: 27 mL/min — ABNORMAL LOW (ref >60)
Glucose, Bld: 126 mg/dL — ABNORMAL HIGH (ref 70–99)
Potassium: 5.1 mmol/L (ref 3.5–5.1)
Sodium: 137 mmol/L (ref 135–145)

## 2021-12-12 MED ORDER — FUROSEMIDE 40 MG PO TABS: 40.0000 mg | ORAL_TABLET | ORAL | Status: DC

## 2021-12-12 NOTE — Progress Notes (Signed)
Ref: Willey Blade, MD ? ? ?Subjective:  ?Feeling better.  ?VS stable. ?Monitor shows sinus rhythm. ?Right radial cath site is stable with mild soreness in forearm. ?Right groin cath site is also stable. ?Small hematoma on LLQ abdomen at SQ heparin injection site is getting smaller. ?Patient on Aspirin and Plavix. ?Will hold Eliquis for now. ?Creatinine is elevated at 1.81 mg. Patient aware of chronic kidney disease and need to see Renal doctor in near future. ? ?Objective:  ?Vital Signs in the last 24 hours: ?Temp:  [98.1 ?F (36.7 ?C)] 98.1 ?F (36.7 ?C) (05/13 1300) ?Pulse Rate:  [56-76] 62 (05/13 1300) ?Cardiac Rhythm: Sinus bradycardia (05/13 0741) ?Resp:  [14-24] 18 (05/13 1300) ?BP: (98-188)/(30-95) 107/50 (05/13 1300) ?SpO2:  [91 %-100 %] 100 % (05/13 1300) ?Weight:  [62.9 kg] 62.9 kg (05/13 0534) ? ?Physical Exam: ?BP Readings from Last 1 Encounters:  ?12/12/21 (!) 107/50  ?   ?Wt Readings from Last 1 Encounters:  ?12/12/21 62.9 kg  ?  Weight change: -6.183 kg Body mass index is 20.48 kg/m?. ?HEENT: [place]/AT, Eyes-Brown, Conjunctiva-Pale pink, Sclera-Non-icteric ?Neck: No JVD, No bruit, Trachea midline. ?Lungs:  Clear, Bilateral. ?Cardiac:  Regular rhythm, normal S1 and S2, no S3. II/VI systolic murmur. ?Abdomen:  Soft, non-tender. BS present. ?Extremities:  No edema present. No cyanosis. No clubbing. ?CNS: AxOx3, Cranial nerves grossly intact, moves all 4 extremities.  ?Skin: Warm and dry. ? ? ?Intake/Output from previous day: ?05/12 0701 - 05/13 0700 ?In: 2334.6 [I.V.:2334.6] ?Out: -  ? ? ? ?Lab Results: ?BMET ?   ?Component Value Date/Time  ? NA 137 12/12/2021 0351  ? NA 138 12/11/2021 1304  ? NA 140 12/11/2021 0747  ? NA 140 06/02/2017 1322  ? K 5.1 12/12/2021 0351  ? K 4.4 12/11/2021 1304  ? K 4.5 12/11/2021 0747  ? K 4.2 06/02/2017 1322  ? CL 108 12/12/2021 0351  ? CL 105 12/11/2021 1304  ? CL 106 12/11/2021 0747  ? CO2 23 12/12/2021 0351  ? CO2 24 12/11/2021 1304  ? CO2 24 12/11/2021 0747  ? CO2 20  (L) 06/02/2017 1322  ? GLUCOSE 126 (H) 12/12/2021 0351  ? GLUCOSE 124 (H) 12/11/2021 1304  ? GLUCOSE 111 (H) 12/11/2021 0747  ? GLUCOSE 131 06/02/2017 1322  ? BUN 36 (H) 12/12/2021 0351  ? BUN 32 (H) 12/11/2021 1304  ? BUN 34 (H) 12/11/2021 0747  ? BUN 22.9 06/02/2017 1322  ? CREATININE 1.81 (H) 12/12/2021 0351  ? CREATININE 1.78 (H) 12/11/2021 1304  ? CREATININE 1.83 (H) 12/11/2021 0747  ? CREATININE 1.28 (H) 07/08/2021 1452  ? CREATININE 1.28 (H) 07/08/2020 0950  ? CREATININE 1.16 (H) 07/09/2019 1012  ? CREATININE 1.2 (H) 06/02/2017 1322  ? CALCIUM 7.8 (L) 12/12/2021 0351  ? CALCIUM 8.7 (L) 12/11/2021 1304  ? CALCIUM 8.2 (L) 12/11/2021 0747  ? CALCIUM 9.3 06/02/2017 1322  ? GFRNONAA 27 (L) 12/12/2021 0351  ? GFRNONAA 28 (L) 12/11/2021 1304  ? GFRNONAA 27 (L) 12/11/2021 0747  ? GFRNONAA 41 (L) 07/08/2021 1452  ? GFRNONAA 42 (L) 07/08/2020 0950  ? GFRNONAA 44 (L) 07/09/2019 1012  ? GFRAA 51 (L) 07/09/2019 1012  ? GFRAA 58 (L) 12/10/2018 0250  ? GFRAA >60 12/09/2018 0658  ? GFRAA 44 (L) 12/08/2018 0231  ? GFRAA 53 (L) 06/05/2018 0815  ? ?CBC ?   ?Component Value Date/Time  ? WBC 6.5 12/12/2021 0351  ? RBC 3.82 (L) 12/12/2021 0351  ? HGB 10.2 (L) 12/12/2021 0351  ?  HGB 11.0 (L) 07/08/2021 1452  ? HGB 10.0 (L) 06/02/2017 1322  ? HCT 31.9 (L) 12/12/2021 0351  ? HCT 31.7 (L) 06/02/2017 1322  ? PLT 201 12/12/2021 0351  ? PLT 275 07/08/2021 1452  ? PLT 208 06/02/2017 1322  ? MCV 83.5 12/12/2021 0351  ? MCV 82.3 06/02/2017 1322  ? MCH 26.7 12/12/2021 0351  ? MCHC 32.0 12/12/2021 0351  ? RDW 15.1 12/12/2021 0351  ? RDW 15.4 (H) 06/02/2017 1322  ? LYMPHSABS 1.6 12/07/2021 2115  ? LYMPHSABS 1.8 06/02/2017 1322  ? MONOABS 0.4 12/07/2021 2115  ? MONOABS 0.3 06/02/2017 1322  ? EOSABS 0.1 12/07/2021 2115  ? EOSABS 0.4 06/02/2017 1322  ? BASOSABS 0.0 12/07/2021 2115  ? BASOSABS 0.0 06/02/2017 1322  ? ?HEPATIC Function Panel ?Recent Labs  ?  07/08/21 ?1452 12/07/21 ?2115  ?PROT 7.5 6.6  ? ?HEMOGLOBIN A1C ?No components found for:  HGA1C,  MPG ?CARDIAC ENZYMES ?Lab Results  ?Component Value Date  ? CKTOTAL 75 06/09/2011  ? CKMB 2.4 06/09/2011  ? TROPONINI <0.30 06/09/2011  ? TROPONINI <0.30 06/08/2011  ? TROPONINI <0.30 06/08/2011  ? ?BNP ?No results for input(s): PROBNP in the last 8760 hours. ?TSH ?No results for input(s): TSH in the last 8760 hours. ?CHOLESTEROL ?Recent Labs  ?  08/31/21 ?8502 12/10/21 ?0209  ?CHOL 102 108  ? ? ?Scheduled Meds: ? aspirin EC  81 mg Oral Daily  ? atorvastatin  20 mg Oral q1800  ? cholecalciferol  5,000 Units Oral Daily  ? clopidogrel  75 mg Oral Q breakfast  ? diltiazem  60 mg Oral BID  ? [START ON 12/14/2021] furosemide  40 mg Oral Q M,W,F  ? gabapentin  100 mg Oral QHS  ? heparin  5,000 Units Subcutaneous Q8H  ? irbesartan  75 mg Oral Daily  ? metoprolol succinate  25 mg Oral QPM  ? mometasone-formoterol  2 puff Inhalation BID  ? pantoprazole  40 mg Oral Daily  ? sodium chloride flush  3 mL Intravenous Q12H  ? sodium chloride flush  3 mL Intravenous Q12H  ? sodium chloride flush  3 mL Intravenous Q12H  ? sodium chloride flush  3 mL Intravenous Q12H  ? ?Continuous Infusions: ? sodium chloride    ? sodium chloride    ? sodium chloride 100 mL/hr at 12/11/21 1925  ? sodium chloride    ? aminophylline (Cath Lab Atherectomy) bolus    ? ?PRN Meds:.sodium chloride, sodium chloride, sodium chloride, acetaminophen, albuterol, nitroGLYCERIN, ondansetron (ZOFRAN) IV, sodium chloride flush, sodium chloride flush, sodium chloride flush ? ?Assessment/Plan: ? Acute systolic heart failure ?Unstable angina ?Multivessel CAD ?S/P LAD and RCA stenting. ?Acute renal failure on chronic kidney disease ?HTN ?HLD ?Type 2 DM ?CAD ?CKD, IIIb ? ?Plan: ?Continue IV fluids as tolerated. ?Repeat BMET in AM. ? ? ? LOS: 5 days  ? ?Time spent including chart review, lab review, examination, discussion with patient/Family : 30 min ? ? ?Dixie Dials  MD  ?12/12/2021, 3:08 PM ? ? ? ? ?

## 2021-12-12 NOTE — Plan of Care (Signed)
?  Problem: Education: ?Goal: Ability to demonstrate management of disease process will improve ?Outcome: Progressing ?  ?

## 2021-12-12 NOTE — Progress Notes (Signed)
Ref: Willey Blade, MD ? ? ?Subjective:  ?Awake. ?Awaiting RCA stenting by Dr. Ellyn Hack. ?Creatinine is 1.83 mg. ? ?Objective:  ?Vital Signs in the last 24 hours: ?Temp:  [98.1 ?F (36.7 ?C)] 98.1 ?F (36.7 ?C) (05/13 1300) ?Pulse Rate:  [56-76] 62 (05/13 1300) ?Cardiac Rhythm: Sinus bradycardia (05/13 0741) ?Resp:  [14-24] 18 (05/13 1300) ?BP: (98-188)/(30-95) 107/50 (05/13 1300) ?SpO2:  [91 %-100 %] 100 % (05/13 1300) ?Weight:  [62.9 kg] 62.9 kg (05/13 0534) ? ?Physical Exam: ?BP Readings from Last 1 Encounters:  ?12/12/21 (!) 107/50  ?   ?Wt Readings from Last 1 Encounters:  ?12/12/21 62.9 kg  ?  Weight change: -6.183 kg Body mass index is 20.48 kg/m?. ?HEENT: [name] [name]/AT, Eyes-Brown, Conjunctiva-Pink, Sclera-Non-icteric ?Neck: No JVD, No bruit, Trachea midline. ?Lungs:  Clear, Bilateral. ?Cardiac:  Regular rhythm, normal S1 and S2, no S3. II/VI systolic murmur. ?Abdomen:  Soft, non-tender. BS present. Stable right groin cath site. ?Extremities:  No edema present. No cyanosis. No clubbing. ?CNS: AxOx3, Cranial nerves grossly intact, moves all 4 extremities.  ?Skin: Warm and dry. ? ? ?Intake/Output from previous day: ?05/12 0701 - 05/13 0700 ?In: 2334.6 [I.V.:2334.6] ?Out: -  ? ? ? ?Lab Results: ?BMET ?   ?Component Value Date/Time  ? NA 137 12/12/2021 0351  ? NA 138 12/11/2021 1304  ? NA 140 12/11/2021 0747  ? NA 140 06/02/2017 1322  ? K 5.1 12/12/2021 0351  ? K 4.4 12/11/2021 1304  ? K 4.5 12/11/2021 0747  ? K 4.2 06/02/2017 1322  ? CL 108 12/12/2021 0351  ? CL 105 12/11/2021 1304  ? CL 106 12/11/2021 0747  ? CO2 23 12/12/2021 0351  ? CO2 24 12/11/2021 1304  ? CO2 24 12/11/2021 0747  ? CO2 20 (L) 06/02/2017 1322  ? GLUCOSE 126 (H) 12/12/2021 0351  ? GLUCOSE 124 (H) 12/11/2021 1304  ? GLUCOSE 111 (H) 12/11/2021 0747  ? GLUCOSE 131 06/02/2017 1322  ? BUN 36 (H) 12/12/2021 0351  ? BUN 32 (H) 12/11/2021 1304  ? BUN 34 (H) 12/11/2021 0747  ? BUN 22.9 06/02/2017 1322  ? CREATININE 1.81 (H) 12/12/2021 0351  ? CREATININE  1.78 (H) 12/11/2021 1304  ? CREATININE 1.83 (H) 12/11/2021 0747  ? CREATININE 1.28 (H) 07/08/2021 1452  ? CREATININE 1.28 (H) 07/08/2020 0950  ? CREATININE 1.16 (H) 07/09/2019 1012  ? CREATININE 1.2 (H) 06/02/2017 1322  ? CALCIUM 7.8 (L) 12/12/2021 0351  ? CALCIUM 8.7 (L) 12/11/2021 1304  ? CALCIUM 8.2 (L) 12/11/2021 0747  ? CALCIUM 9.3 06/02/2017 1322  ? GFRNONAA 27 (L) 12/12/2021 0351  ? GFRNONAA 28 (L) 12/11/2021 1304  ? GFRNONAA 27 (L) 12/11/2021 0747  ? GFRNONAA 41 (L) 07/08/2021 1452  ? GFRNONAA 42 (L) 07/08/2020 0950  ? GFRNONAA 44 (L) 07/09/2019 1012  ? GFRAA 51 (L) 07/09/2019 1012  ? GFRAA 58 (L) 12/10/2018 0250  ? GFRAA >60 12/09/2018 0658  ? GFRAA 44 (L) 12/08/2018 0231  ? GFRAA 53 (L) 06/05/2018 0815  ? ?CBC ?   ?Component Value Date/Time  ? WBC 6.5 12/12/2021 0351  ? RBC 3.82 (L) 12/12/2021 0351  ? HGB 10.2 (L) 12/12/2021 0351  ? HGB 11.0 (L) 07/08/2021 1452  ? HGB 10.0 (L) 06/02/2017 1322  ? HCT 31.9 (L) 12/12/2021 0351  ? HCT 31.7 (L) 06/02/2017 1322  ? PLT 201 12/12/2021 0351  ? PLT 275 07/08/2021 1452  ? PLT 208 06/02/2017 1322  ? MCV 83.5 12/12/2021 0351  ? MCV 82.3 06/02/2017 1322  ?  MCH 26.7 12/12/2021 0351  ? MCHC 32.0 12/12/2021 0351  ? RDW 15.1 12/12/2021 0351  ? RDW 15.4 (H) 06/02/2017 1322  ? LYMPHSABS 1.6 12/07/2021 2115  ? LYMPHSABS 1.8 06/02/2017 1322  ? MONOABS 0.4 12/07/2021 2115  ? MONOABS 0.3 06/02/2017 1322  ? EOSABS 0.1 12/07/2021 2115  ? EOSABS 0.4 06/02/2017 1322  ? BASOSABS 0.0 12/07/2021 2115  ? BASOSABS 0.0 06/02/2017 1322  ? ?HEPATIC Function Panel ?Recent Labs  ?  07/08/21 ?1452 12/07/21 ?2115  ?PROT 7.5 6.6  ? ?HEMOGLOBIN A1C ?No components found for: HGA1C,  MPG ?CARDIAC ENZYMES ?Lab Results  ?Component Value Date  ? CKTOTAL 75 06/09/2011  ? CKMB 2.4 06/09/2011  ? TROPONINI <0.30 06/09/2011  ? TROPONINI <0.30 06/08/2011  ? TROPONINI <0.30 06/08/2011  ? ?BNP ?No results for input(s): PROBNP in the last 8760 hours. ?TSH ?No results for input(s): TSH in the last 8760  hours. ?CHOLESTEROL ?Recent Labs  ?  08/31/21 ?4403 12/10/21 ?0209  ?CHOL 102 108  ? ? ?Scheduled Meds: ? aspirin EC  81 mg Oral Daily  ? atorvastatin  20 mg Oral q1800  ? cholecalciferol  5,000 Units Oral Daily  ? clopidogrel  75 mg Oral Q breakfast  ? diltiazem  60 mg Oral BID  ? [START ON 12/14/2021] furosemide  40 mg Oral Q M,W,F  ? gabapentin  100 mg Oral QHS  ? heparin  5,000 Units Subcutaneous Q8H  ? irbesartan  75 mg Oral Daily  ? metoprolol succinate  25 mg Oral QPM  ? mometasone-formoterol  2 puff Inhalation BID  ? pantoprazole  40 mg Oral Daily  ? sodium chloride flush  3 mL Intravenous Q12H  ? sodium chloride flush  3 mL Intravenous Q12H  ? sodium chloride flush  3 mL Intravenous Q12H  ? sodium chloride flush  3 mL Intravenous Q12H  ? ?Continuous Infusions: ? sodium chloride    ? sodium chloride    ? sodium chloride 100 mL/hr at 12/11/21 1925  ? sodium chloride    ? aminophylline (Cath Lab Atherectomy) bolus    ? ?PRN Meds:.sodium chloride, sodium chloride, sodium chloride, acetaminophen, albuterol, nitroGLYCERIN, ondansetron (ZOFRAN) IV, sodium chloride flush, sodium chloride flush, sodium chloride flush ? ?Assessment/Plan: ? Acute systolic heart failure ?Unstable angina ?Multivessel CAD ?S/P LAD stenting ?COPD ?Emphysema ?HTN ?HLD ?Type 2 DM ?CKD, IIIb ? ?Plan: ?IV fluids prior to RCA stenting. ? ? LOS: 5 days  ? ?Time spent including chart review, lab review, examination, discussion with patient/Family/Doctor : 30 min ? ? ?Dixie Dials  MD  ?12/12/2021, 3:16 PM ? ? ? ? ?

## 2021-12-12 NOTE — Progress Notes (Signed)
CARDIAC REHAB PHASE I  ? ?PRE:  Rate/Rhythm: 60 SR ? ?  BP: sitting 107/53 ? ?  SaO2:  ? ?MODE:  Ambulation: 430 ft  ? ?POST:  Rate/Rhythm: 74 SR ? ?  BP: sitting 131/58  ? ?  SaO2:  ? ?Ambulated with contact guard without c/o, slow pace. She uses cane or rollator at home. Discussed with pt stents, importance of Plavix (she sts she has been on Eliquis), restrictions, smoking cessation, diet, exercise, NTG and CRPII. Pt receptive. Will refer to [place]. She plans to try to quit smoking. ?[id number]  ? ?Yves Dill CES, ACSM ?12/12/2021 ?10:35 AM ? ? ? ? ?

## 2021-12-13 LAB — BASIC METABOLIC PANEL
Anion gap: 9 (ref 5–15)
BUN: 35 mg/dL — ABNORMAL HIGH (ref 8–23)
CO2: 21 mmol/L — ABNORMAL LOW (ref 22–32)
Calcium: 8 mg/dL — ABNORMAL LOW (ref 8.9–10.3)
Chloride: 109 mmol/L (ref 98–111)
Creatinine, Ser: 1.74 mg/dL — ABNORMAL HIGH (ref 0.44–1.00)
GFR, Estimated: 29 mL/min — ABNORMAL LOW (ref >60)
Glucose, Bld: 111 mg/dL — ABNORMAL HIGH (ref 70–99)
Potassium: 4.5 mmol/L (ref 3.5–5.1)
Sodium: 139 mmol/L (ref 135–145)

## 2021-12-13 MED ORDER — BUDESONIDE-FORMOTEROL FUMARATE 80-4.5 MCG/ACT IN AERO: 1.0000 | INHALATION_SPRAY | Freq: Two times a day (BID) | RESPIRATORY_TRACT | 3 refills | Status: AC

## 2021-12-13 MED ORDER — ASPIRIN 81 MG PO TBEC: 81.0000 mg | DELAYED_RELEASE_TABLET | Freq: Every day | ORAL | Status: DC

## 2021-12-13 MED ORDER — CLOPIDOGREL BISULFATE 75 MG PO TABS: 75.0000 mg | ORAL_TABLET | Freq: Every day | ORAL | 1 refills | Status: AC

## 2021-12-13 MED ORDER — ATORVASTATIN CALCIUM 40 MG PO TABS
40.0000 mg | ORAL_TABLET | Freq: Every day | ORAL | 3 refills | Status: AC
Start: 2021-12-13 — End: ?

## 2021-12-13 MED ORDER — METFORMIN HCL 500 MG PO TABS: 250.0000 mg | ORAL_TABLET | Freq: Two times a day (BID) | ORAL | 1 refills | Status: AC

## 2021-12-13 MED ORDER — TORSEMIDE 20 MG PO TABS: 20.0000 mg | ORAL_TABLET | ORAL | 1 refills | Status: AC

## 2021-12-13 NOTE — Discharge Summary (Signed)
Physician Discharge Summary  ?Patient ID: ?[name] [name] ?MRN: [id number] ?DOB/AGE: 1934/10/13 87 y.o. ? ?Admit date: 12/07/2021 ?Discharge date: 12/13/2021 ? ?Admission Diagnoses: ?Acute diastolic left heart failure ?HTN ?HLD ?Type 2 DM ?CAD ?COPD ? ?Discharge Diagnoses:  ?Principal Problem: ?  Acute systolic left heart failure ([place]) ?Active Problems: ?  Unstable angina ([place]) ?  Multivessel, native vessels CAD ?  S/P LAD and RCA stenting ?  Mitral regurgitation ?  Tricuspid regurgitation ?  Acute renal failure on CKD, IIIb ?  HTN ?  HLD ?  Type 2 DM ?  COPD ?  Emphysema ?  Mild pulmonary hypertension ?  Tobacco use disorder ?  Anemia of chronic disease ? ?Discharged Condition: fair ? ?Hospital Course: 87 years old female with PMH of CAD, Asthma, COPD, Type 2 DM, HTN, Lung cancer s/p left upper lobe surgery and stroke had 2 weeks of chest tightness, leg edema and shortness of breath. She had elevated BNP and had slow and steady diuresis with IV Lasix use.  ?She underwent R + L cardiac catheterization which showed multivessel CAD and mild pulmonary hypertension. She had Mid LAD lesion stented by Dr. Fletcher Anon and after 2 days she had right coronary artery 2 stents placed by Dr. Ellyn Hack. ?Her renal function deteriorated but improved some with IV fluids and discontinuation of diuretic for 3 days and resumed with lower frequency. Her Hypokalemia also resolved. ?She was discharged home in stable condition with aspirin. Plavix and higher dose of atorvastatin.  ?She will see me in 1 week and primary care in 2 weeks. ? ?Consults: cardiology ? ?Significant Diagnostic Studies: labs: Mild anemia otherwise unremarkable CBC. ?BMET with mild hyperglycemia and creatinine of 1.53 mg. To 1.83 mg. Lipid panel normal with LDL cholesterol of 54 mg. Elevated Lipoprotein a at 98.2 nmol/L ? ?EKG: Sinus bradycardia. ? ?CXR: Cardiomegaly, emphysema and pulmonary fibrotic changes. ? ?Echocardiogram: Mild LV systolic dysfunction, mild pulmonary  hypertension, MR and TR. ? ?R + L Cardiac catheterization : Mild pulmonary HTN, Severe LAD and RCA disease.  ?Successful stenting of mid LAD by Dr. Fletcher Anon and 2 days later successful stenting of RCA by Dr. Ellyn Hack. ? ?Treatments: cardiac meds: metoprolol, diltiazem, and aspirin, Plavix, Valsartan and Atorvastatin. ? ?Discharge Exam: ?Blood pressure (!) 110/45, pulse (!) 58, temperature 98 ?F (36.7 ?C), temperature source Oral, resp. rate 16, height 5\' 9"  (1.753 m), weight 62.4 kg, SpO2 96 %. ?General appearance: alert, cooperative and appears stated age. ?Head: Normocephalic, atraumatic. ?Eyes: Brown eyes, pale pink conjunctiva, corneas clear.  ?Neck: No adenopathy, no carotid bruit, no JVD, supple, symmetrical, trachea midline and thyroid not enlarged. ?Resp: Clearing to auscultation bilaterally. ?Cardio: Regular rate and rhythm, S1, S2 normal, II/VI systolic murmur, no click, rub or gallop. ?GI: Soft, non-tender; bowel sounds normal; no organomegaly. ?Extremities: No edema, cyanosis or clubbing. ?Skin: Warm and dry.  ?Neurologic: Alert and oriented X 3, normal strength and tone. Normal coordination and slow gait. ? ?Disposition: Discharge disposition: 01-Home or Self Care ? ? ? ? ? ? ?Discharge Instructions   ? ? Amb Referral to Cardiac Rehabilitation   Complete by: As directed ?  ? Diagnosis:  Coronary Stents ?PTCA  ?  ? After initial evaluation and assessments completed: Virtual Based Care may be provided alone or in conjunction with Phase 2 Cardiac Rehab based on patient barriers.: Yes  ? ?  ? ?Allergies as of 12/13/2021   ? ?   Reactions  ? Penicillins Rash, Other (See Comments)  ?  PATIENT HAS HAD A PCN REACTION WITH IMMEDIATE RASH, FACIAL/TONGUE/THROAT SWELLING, SOB, OR LIGHTHEADEDNESS WITH HYPOTENSION:  #  #  #  YES  #  #  #  ?Has patient had a PCN reaction causing severe rash involving mucus membranes or skin necrosis: NO ?Has patient had a PCN reaction that required hospitalization NO ?Has patient had a PCN  reaction occurring within the last 10 years: NO  ? Tramadol Itching  ? Codeine Rash  ? ?  ? ?  ?Medication List  ?  ? ?STOP taking these medications   ? ?Eliquis 2.5 MG Tabs tablet ?Generic drug: apixaban ?  ?gabapentin 100 MG capsule ?Commonly known as: Neurontin ?  ?hydrALAZINE 25 MG tablet ?Commonly known as: APRESOLINE ?  ?methocarbamol 500 MG tablet ?Commonly known as: ROBAXIN ?  ?potassium chloride 10 MEQ tablet ?Commonly known as: KLOR-CON M ?  ? ?  ? ?TAKE these medications   ? ?acetaminophen 500 MG tablet ?Commonly known as: TYLENOL ?Take 1,000 mg by mouth every 6 (six) hours as needed for mild pain or headache. ?  ?aspirin 81 MG EC tablet ?Take 1 tablet (81 mg total) by mouth daily. Swallow whole. ?  ?atorvastatin 40 MG tablet ?Commonly known as: LIPITOR ?Take 1 tablet (40 mg total) by mouth daily at 6 PM. ?What changed:  ?medication strength ?how much to take ?  ?budesonide-formoterol 80-4.5 MCG/ACT inhaler ?Commonly known as: Symbicort ?Inhale 1 puff into the lungs 2 (two) times daily. ?  ?clopidogrel 75 MG tablet ?Commonly known as: PLAVIX ?Take 1 tablet (75 mg total) by mouth daily with breakfast. ?  ?diclofenac Sodium 1 % Gel ?Commonly known as: Voltaren ?Apply 4 g topically 4 (four) times daily as needed. ?What changed: reasons to take this ?  ?diltiazem 60 MG tablet ?Commonly known as: CARDIZEM ?Take 60 mg by mouth 2 (two) times daily. ?  ?metFORMIN 500 MG tablet ?Commonly known as: GLUCOPHAGE ?Take 0.5 tablets (250 mg total) by mouth 2 (two) times daily with a meal. ?What changed:  ?how much to take ?when to take this ?  ?metoprolol succinate 25 MG 24 hr tablet ?Commonly known as: TOPROL-XL ?Take 25 mg by mouth every evening. ?  ?nitroGLYCERIN 0.4 MG SL tablet ?Commonly known as: NITROSTAT ?Place 0.4 mg under the tongue every 5 (five) minutes as needed for chest pain. ?  ?omeprazole 20 MG capsule ?Commonly known as: PRILOSEC ?Take 20 mg by mouth daily. ?  ?torsemide 20 MG tablet ?Commonly known as:  DEMADEX ?Take 1 tablet (20 mg total) by mouth every Monday, Wednesday, and Friday. ?Start taking on: Dec 14, 2021 ?What changed: when to take this ?  ?valsartan 160 MG tablet ?Commonly known as: DIOVAN ?Take 160 mg by mouth daily. ?  ?Vitamin D3 125 MCG (5000 UT) Tabs ?Take 5,000 Units by mouth daily. ?  ? ?  ? ? Follow-up Information   ? ? Dixie Dials, MD Follow up in 1 week(s).   ?Specialty: Cardiology ?Contact information: ?[address] ?[address] ?[place] [address] ?[phone] ? ? ?  ?  ? ? Willey Blade, MD Follow up in 2 week(s).   ?Specialty: Internal Medicine ?Contact information: ?[address] ?[place] [phone] ?[phone] ? ? ?  ?  ? ?  ?  ? ?  ? ? ?Time spent: Review of old chart, current chart, lab, x-ray, cardiac tests and discussion with patient over 60 minutes. ? ?Signed: ?Birdie Riddle ?12/13/2021, 9:41 AM ? ? ?

## 2021-12-13 NOTE — Plan of Care (Signed)

## 2021-12-14 ENCOUNTER — Encounter (HOSPITAL_COMMUNITY): Payer: Self-pay | Admitting: Cardiology

## 2021-12-14 ENCOUNTER — Other Ambulatory Visit (HOSPITAL_COMMUNITY): Payer: Self-pay

## 2021-12-15 ENCOUNTER — Encounter (HOSPITAL_COMMUNITY): Payer: Self-pay

## 2021-12-15 ENCOUNTER — Other Ambulatory Visit (HOSPITAL_COMMUNITY): Payer: Self-pay

## 2021-12-15 ENCOUNTER — Ambulatory Visit (HOSPITAL_COMMUNITY)
Admit: 2021-12-15 | Discharge: 2021-12-15 | Disposition: A | Discharge status: Home / Self Care | Payer: Medicare PPO | Attending: Cardiology | Admitting: Cardiology

## 2021-12-15 ENCOUNTER — Telehealth (HOSPITAL_COMMUNITY): Payer: Self-pay | Admitting: *Deleted

## 2021-12-15 VITALS — BP 142/66 | HR 73 | Wt 164.2 lb

## 2021-12-15 DIAGNOSIS — Z79899 Other long term (current) drug therapy: Secondary | ICD-10-CM | POA: Diagnosis not present

## 2021-12-15 DIAGNOSIS — Z955 Presence of coronary angioplasty implant and graft: Secondary | ICD-10-CM | POA: Insufficient documentation

## 2021-12-15 DIAGNOSIS — J449 Chronic obstructive pulmonary disease, unspecified: Secondary | ICD-10-CM | POA: Insufficient documentation

## 2021-12-15 DIAGNOSIS — E785 Hyperlipidemia, unspecified: Secondary | ICD-10-CM | POA: Diagnosis not present

## 2021-12-15 DIAGNOSIS — Z9861 Coronary angioplasty status: Secondary | ICD-10-CM | POA: Diagnosis not present

## 2021-12-15 DIAGNOSIS — I5022 Chronic systolic (congestive) heart failure: Secondary | ICD-10-CM | POA: Diagnosis not present

## 2021-12-15 DIAGNOSIS — R079 Chest pain, unspecified: Secondary | ICD-10-CM | POA: Insufficient documentation

## 2021-12-15 DIAGNOSIS — E1122 Type 2 diabetes mellitus with diabetic chronic kidney disease: Secondary | ICD-10-CM | POA: Insufficient documentation

## 2021-12-15 DIAGNOSIS — F17200 Nicotine dependence, unspecified, uncomplicated: Secondary | ICD-10-CM | POA: Insufficient documentation

## 2021-12-15 DIAGNOSIS — E1136 Type 2 diabetes mellitus with diabetic cataract: Secondary | ICD-10-CM | POA: Diagnosis not present

## 2021-12-15 DIAGNOSIS — I130 Hypertensive heart and chronic kidney disease with heart failure and stage 1 through stage 4 chronic kidney disease, or unspecified chronic kidney disease: Secondary | ICD-10-CM | POA: Insufficient documentation

## 2021-12-15 DIAGNOSIS — I255 Ischemic cardiomyopathy: Secondary | ICD-10-CM | POA: Diagnosis not present

## 2021-12-15 DIAGNOSIS — Z85118 Personal history of other malignant neoplasm of bronchus and lung: Secondary | ICD-10-CM | POA: Diagnosis not present

## 2021-12-15 DIAGNOSIS — Z7902 Long term (current) use of antithrombotics/antiplatelets: Secondary | ICD-10-CM | POA: Diagnosis not present

## 2021-12-15 DIAGNOSIS — I2510 Atherosclerotic heart disease of native coronary artery without angina pectoris: Secondary | ICD-10-CM | POA: Diagnosis not present

## 2021-12-15 DIAGNOSIS — Z8673 Personal history of transient ischemic attack (TIA), and cerebral infarction without residual deficits: Secondary | ICD-10-CM | POA: Insufficient documentation

## 2021-12-15 DIAGNOSIS — I081 Rheumatic disorders of both mitral and tricuspid valves: Secondary | ICD-10-CM | POA: Diagnosis not present

## 2021-12-15 DIAGNOSIS — I252 Old myocardial infarction: Secondary | ICD-10-CM | POA: Diagnosis not present

## 2021-12-15 NOTE — Patient Instructions (Signed)
Thank you for allowing [place] to provider your heart failure care after your recent hospitalization. Please follow-up with Dr Doylene Canard as scheduled 12/16/21 ? ?Do the following things EVERYDAY: ?Weigh yourself in the morning before breakfast. Write it down and keep it in a log. ?Take your medicines as prescribed ?Eat low salt foods--Limit salt (sodium) to 2000 mg per day.  ?Stay as active as you can everyday ?Limit all fluids for the day to less than 2 liters ? ? ?

## 2021-12-15 NOTE — Telephone Encounter (Signed)
Called to confirm Heart & Vascular Transitions of Care appointment at 11 am on 12/15/21. Patient reminded to bring all medications and pill box organizer with them. Confirmed patient has transportation. Gave directions, instructed to utilize [place] parking. ? ?Confirmed appointment prior to ending call.   ? ?Earnestine Leys, BSN, RN ?Heart Failure Nurse Navigator ?Secure Chat Only  ?

## 2021-12-15 NOTE — Progress Notes (Signed)
? ? ?HEART & VASCULAR TRANSITION OF CARE CONSULT NOTE  ? ? ? ?Referring Physician: Dr. Doylene Canard ?Primary Care: Willey Blade, MD  ?Primary Cardiologist: Dr. Doylene Canard  ? ?HPI: ?Referred to clinic by Dr. Doylene Canard for heart failure consultation.  ? ?87 y/o AAF w/ h/o CAD, tobacco use, asthma, COPD, Type 2DM, HTN, CVA, PAD and h/o lung cancer treated w/ upper lobectomy. Followed by Dr. Doylene Canard.  ? ?Recent admission 5/23 for chest pain, dyspnea and edema. Ruled in for NSTEMI and diagnosed w/ CHF.  ? ?Echo showed moderately reduced LVEF, 40-45% (previously normal on echo in 2018, 50-55%). RV was normal. Severe BAE w/ mild-mod MR and severe TR also noted.  ? ?She underwent R + L cardiac catheterization which showed multivessel CAD and mild pulmonary hypertension. She had Mid LAD lesion stented by Dr. Fletcher Anon and after 2 days she had right coronary artery 2 stents placed by Dr. Ellyn Hack. ? ?Placed on DAPT w/ ASA + Plavix + high intensity statin. Eliquis discontinued (prior TIAs)  ? ?GDMT for HF limited by CKD, BL SCr ~1.8. Continued on Valsartan. Transitioned to PO torsemide after diuresis w/ IV Lasix.  Was advised by Dr. Doylene Canard to f/u w/ nephrology post hospital.  ? ?Presents to [facility] clinic today for assessment.  ? ?Of note, per chart review she is on both Toprol XL and Cardizem (60 mg bid). No documented h/o AFib. Today's VS BP 142/66, Pulse 73  ? ?She reports doing well. Denies any further CP. Still w/ some SOB w/ exertion (chronic) but much improved and back to baseline. Still smoking but trying to quit. Reports full med compliance. Tolerating meds ok. No dizziness.  ? ?Lives home w/ son. Ambulates w/ walker. Doesn't leave the house much.  ? ?Cardiac Testing  ? ?2D Echo 12/08/21 ?Left ventricular ejection fraction, by estimation, is 40 to 45%. The left ventricle has ?mildly decreased function. The left ventricle demonstrates global hypokinesis. There ?is mild concentric left ventricular hypertrophy. Left ventricular  diastolic parameters ?are consistent with Grade I diastolic dysfunction (impaired relaxation). ?1. ?Right ventricular systolic function is normal. The right ventricular size is normal. ?There is severely elevated pulmonary artery systolic pressure. ?2. ?3. Left atrial size was severely dilated. ?4. Right atrial size was severely dilated. ?5. The mitral valve is degenerative. Mild to moderate mitral valve regurgitation. ?6. Tricuspid valve regurgitation is severe. ?The aortic valve is bicuspid. There is mild calcification of the aortic valve. There is ?mild thickening of the aortic valve. Aortic valve regurgitation is not visualized. ?7. ?8. There is mild (Grade II) atheroma plaque involving the aortic root and ascending aorta. ?The inferior vena cava is normal in size with <50% respiratory variability, suggesting ?right atrial pressure of 8 mmHg. ? ?R/LHC 12/09/21 ?  Mid LAD lesion is 90% stenosed. ?  Prox RCA lesion is 50% stenosed. ?  Mid RCA lesion is 90% stenosed. ?  There is mild left ventricular systolic dysfunction. ?  LV end diastolic pressure is mildly elevated. ?  The left ventricular ejection fraction is 45-50% by visual estimate. ?  Hemodynamic findings consistent with mild pulmonary hypertension ? ?Interventions: 5/10 Successful angioplasty and drug-eluting stent placement to the mid LAD. ?       5/ 12 Successful Complex Orbital Atherectomy-Guided DES PCI of ~Ostial-to-mid RCA with  overlapping Synergy XD DES - 2.5 mm x 32 mm & 3.0 mm x 28 mm ? ? ? ? ?Review of Systems: [y] = yes, [ ]  = no  ? ?General:  Weight gain [ ] ; Weight loss [ ] ; Anorexia [ ] ; Fatigue [ ] ; Fever [ ] ; Chills [ ] ; Weakness [ ]   ?Cardiac: Chest pain/pressure [ ] ; Resting SOB [ ] ; Exertional SOB [Y ]; Orthopnea [ ] ; Pedal Edema [ ] ; Palpitations [ ] ; Syncope [ ] ; Presyncope [ ] ; Paroxysmal nocturnal dyspnea[ ]   ?Pulmonary: Cough [ ] ; Wheezing[ ] ; Hemoptysis[ ] ; Sputum [ ] ; Snoring [ ]   ?GI: Vomiting[ ] ; Dysphagia[ ] ; Melena[ ] ;  Hematochezia [ ] ; Heartburn[ ] ; Abdominal pain [ ] ; Constipation [ ] ; Diarrhea [ ] ; BRBPR [ ]   ?GU: Hematuria[ ] ; Dysuria [ ] ; Nocturia[ ]   ?Vascular: Pain in legs with walking [ ] ; Pain in feet with lying flat [ ] ; Non-healing sores [ ] ; Stroke [ ] ; TIA [ ] ; Slurred speech [ ] ;  ?Neuro: Headaches[ ] ; Vertigo[ ] ; Seizures[ ] ; Paresthesias[ ] ;Blurred vision [ ] ; Diplopia [ ] ; Vision changes [ ]   ?Ortho/Skin: Arthritis [ ] ; Joint pain [ ] ; Muscle pain [ ] ; Joint swelling [ ] ; Back Pain [ ] ; Rash [ ]   ?Psych: Depression[ ] ; Anxiety[ ]   ?Heme: Bleeding problems [ ] ; Clotting disorders [ ] ; Anemia [ ]   ?Endocrine: Diabetes [ ] ; Thyroid dysfunction[ ]  ? ? ?Past Medical History:  ?Diagnosis Date  ? Anginal pain ([place])   ? Arthritis   ? Asthma   ? Coronary artery disease   ? Diabetes mellitus   ? Dyspnea   ? Early cataracts, bilateral   ? GERD (gastroesophageal reflux disease)   ? occ  ? Hypertension   ? Lung cancer ([place])   ? Mass of upper lobe of left lung   ? mediastinal adenopathy  ? Palpitations   ? Pneumonia   ? Stroke [facility])   ? mini stroke  ? Wears dentures   ? Wears glasses   ? ? ?Current Outpatient Medications  ?Medication Sig Dispense Refill  ? acetaminophen (TYLENOL) 500 MG tablet Take 1,000 mg by mouth every 6 (six) hours as needed for mild pain or headache.    ? aspirin EC 81 MG EC tablet Take 1 tablet (81 mg total) by mouth daily. Swallow whole.    ? atorvastatin (LIPITOR) 40 MG tablet Take 1 tablet (40 mg total) by mouth daily at 6 PM. 30 tablet 3  ? budesonide-formoterol (SYMBICORT) 80-4.5 MCG/ACT inhaler Inhale 1 puff into the lungs 2 (two) times daily. 1 each 3  ? Cholecalciferol (VITAMIN D3) 125 MCG (5000 UT) TABS Take 5,000 Units by mouth daily.     ? clopidogrel (PLAVIX) 75 MG tablet Take 1 tablet (75 mg total) by mouth daily with breakfast. 90 tablet 1  ? diclofenac Sodium (VOLTAREN) 1 % GEL Apply 4 g topically 4 (four) times daily as needed. (Patient taking differently: Apply 4 g topically 4 (four)  times daily as needed (pain).) 500 g 6  ? diltiazem (CARDIZEM) 60 MG tablet Take 60 mg by mouth 2 (two) times daily.    ? metFORMIN (GLUCOPHAGE) 500 MG tablet Take 0.5 tablets (250 mg total) by mouth 2 (two) times daily with a meal. 90 tablet 1  ? metoprolol succinate (TOPROL-XL) 25 MG 24 hr tablet Take 25 mg by mouth every evening.     ? nitroGLYCERIN (NITROSTAT) 0.4 MG SL tablet Place 0.4 mg under the tongue every 5 (five) minutes as needed for chest pain.    ? omeprazole (PRILOSEC) 20 MG capsule Take 20 mg by mouth daily.    ? torsemide (DEMADEX) 20 MG tablet Take  1 tablet (20 mg total) by mouth every Monday, Wednesday, and Friday. 30 tablet 1  ? valsartan (DIOVAN) 160 MG tablet Take 160 mg by mouth daily.   5  ? ?No current facility-administered medications for this encounter.  ? ? ?Allergies  ?Allergen Reactions  ? Penicillins Rash and Other (See Comments)  ?  PATIENT HAS HAD A PCN REACTION WITH IMMEDIATE RASH, FACIAL/TONGUE/THROAT SWELLING, SOB, OR LIGHTHEADEDNESS WITH HYPOTENSION:  #  #  #  YES  #  #  #  ?Has patient had a PCN reaction causing severe rash involving mucus membranes or skin necrosis: NO ?Has patient had a PCN reaction that required hospitalization NO ?Has patient had a PCN reaction occurring within the last 10 years: NO  ? Tramadol Itching  ? Codeine Rash  ? ? ?  ?Social History  ? ?Socioeconomic History  ? Marital status: Widowed  ?  Spouse name: Not on file  ? Number of children: 6  ? Years of education: Not on file  ? Highest education level: High school graduate  ?Occupational History  ? Occupation: retired  ?  Comment: [employer].  ?Tobacco Use  ? Smoking status: Some Days  ?  Packs/day: 0.25  ?  Years: 12.00  ?  Pack years: 3.00  ?  Types: Cigarettes  ?  Last attempt to quit: 03/2017  ?  Years since quitting: 4.7  ? Smokeless tobacco: Never  ?Vaping Use  ? Vaping Use: Never used  ?Substance and Sexual Activity  ? Alcohol use: No  ? Drug use: No  ? Sexual activity: Not  Currently  ?Other Topics Concern  ? Not on file  ?Social History Narrative  ? Right handed  ? ?Social Determinants of Health  ? ?Financial Resource Strain: Not on file  ?Food Insecurity: No Food Insecurity  ? Worried About Ru

## 2021-12-15 NOTE — Progress Notes (Signed)
CSW met with pt during clinic visit to discuss smoking cessation.  Pt has been long time smoker but reports getting down to 1/2 pack a day prior to hospital stay and states she has only has 1.5 cigarettes total since DC from the hospital.  Reports struggling with cravings however so hopeful for further resources.   ? ?Pt planning to get nicotine gum but also interested in the tobacco cessation resources provided through [facility] so provided with flyer. ? ?Reports no further needs at this time- Will continue to follow and assist as needed ? ?Jorge Ny, LCSW ?Clinical Social Worker ?[facility] ?Desk#: [phone] ?Cell#: [phone] ? ?

## 2021-12-17 NOTE — Code Documentation (Signed)
Please keep acute systolic heart failure as principle diagnosis. By mistake wrote diastolic heart failure in coding query.  Dixie Dials, MD 12/17/2021, 1:35 PM

## 2021-12-21 ENCOUNTER — Ambulatory Visit: Payer: Medicare PPO | Admitting: Podiatry

## 2021-12-22 ENCOUNTER — Telehealth (HOSPITAL_COMMUNITY): Payer: Self-pay

## 2021-12-22 NOTE — Telephone Encounter (Signed)
Pt is not interested in the cardiac rehab program right now. Closed referral. 

## 2021-12-29 ENCOUNTER — Telehealth (HOSPITAL_COMMUNITY): Payer: Self-pay | Admitting: Internal Medicine

## 2021-12-29 NOTE — Progress Notes (Unsigned)
NEUROLOGY FOLLOW UP OFFICE NOTE  [name] [name] [id number]  Assessment/Plan:   Post-traumatic headache complicated by cervicalgia and rotator cuff impingement and possibly medication overuse.  Given history of spontaneous subarachnoid hemorrhage presenting with similar symptoms, will check CT head out of caution.  As headaches clearly started after the fall, giant cell arteritis not suspected. Cervicalgia History of spontaneous subarachnoid hemorrhage Hypertension     *** Limit use of pain relievers to no more than 2 days out of week to prevent risk of rebound or medication-overuse headache. Follow up with PCP regarding blood pressure Follow up 4 months.       Subjective:  [name] [name] is an 87 year old right-handed female with HTN, DM II, asthma, CAD, arthritis, lung cancer and history of stroke who follows up for headache.  UPDATE: CT head on 09/09/2021 personally reviewed was negative for acute findings.    Started gabapentin.  ***  Current NSAIDs/analgesics:  ASA 81mg  daily, diclofenac 25mg  Current muscle relaxer:  methocarbamol  Current antiepileptic:  gabapentin 100mg  at bedtime   HISTORY: On Christmas Day 2022, she was on the grass and fell backwards striking the back of her head and landing on her right shoulder.  No nausea, vomiting, confusion or loss of consciousness.  Since then, she has had had headaches.  They are moderate aching from the front radiating to the right temple and down the right side of her neck into the shoulder. It is constant and improves if she takes Tylenol.  Takes it everyday. No nausea, visual disturbance, confusion, unilateral numbness or weakness.  She followed up with orthopedics.  Cervical spine X-rays on 07/30/2021 personally reviewed showed degenerative changes with advanced facet arthropathy and spurring at C4-5 and C5-6 but no acute osseous fractures.  X-ray of right shoulder revealed arthritic changes but no acute abnormalities.  She  received a shoulder injection and was ordered physical therapy for her neck and shoulder.  First session was yesterday and scheduled for once a week.    She has a prior history of spontaneous subarachnoid hemorrhage back in 2020 presenting as headache and neck pain.  CT head on 11/29/2018 personally reviewed revealed subarachnoid hemorrhage at the craniocervical junction on the left.  Cerebral angiogram was negative for aneurysm or other secondary etiology.       PAST MEDICAL HISTORY: Past Medical History:  Diagnosis Date   Anginal pain ([place])    Arthritis    Asthma    Coronary artery disease    Diabetes mellitus    Dyspnea    Early cataracts, bilateral    GERD (gastroesophageal reflux disease)    occ   Hypertension    Lung cancer ([place])    Mass of upper lobe of left lung    mediastinal adenopathy   Palpitations    Pneumonia    Stroke [facility])    mini stroke   Wears dentures    Wears glasses     MEDICATIONS: Current Outpatient Medications on File Prior to Visit  Medication Sig Dispense Refill   acetaminophen (TYLENOL) 500 MG tablet Take 1,000 mg by mouth every 6 (six) hours as needed for mild pain or headache.     aspirin EC 81 MG EC tablet Take 1 tablet (81 mg total) by mouth daily. Swallow whole.     atorvastatin (LIPITOR) 40 MG tablet Take 1 tablet (40 mg total) by mouth daily at 6 PM. 30 tablet 3   budesonide-formoterol (SYMBICORT) 80-4.5 MCG/ACT inhaler Inhale 1 puff into  the lungs 2 (two) times daily. 1 each 3   Cholecalciferol (VITAMIN D3) 125 MCG (5000 UT) TABS Take 5,000 Units by mouth daily.      clopidogrel (PLAVIX) 75 MG tablet Take 1 tablet (75 mg total) by mouth daily with breakfast. 90 tablet 1   diclofenac Sodium (VOLTAREN) 1 % GEL Apply 4 g topically 4 (four) times daily as needed. (Patient taking differently: Apply 4 g topically 4 (four) times daily as needed (pain).) 500 g 6   diltiazem (CARDIZEM) 60 MG tablet Take 60 mg by mouth 2 (two) times daily.      metFORMIN (GLUCOPHAGE) 500 MG tablet Take 0.5 tablets (250 mg total) by mouth 2 (two) times daily with a meal. 90 tablet 1   metoprolol succinate (TOPROL-XL) 25 MG 24 hr tablet Take 25 mg by mouth every evening.      nitroGLYCERIN (NITROSTAT) 0.4 MG SL tablet Place 0.4 mg under the tongue every 5 (five) minutes as needed for chest pain.     omeprazole (PRILOSEC) 20 MG capsule Take 20 mg by mouth daily.     torsemide (DEMADEX) 20 MG tablet Take 1 tablet (20 mg total) by mouth every Monday, Wednesday, and Friday. 30 tablet 1   valsartan (DIOVAN) 160 MG tablet Take 160 mg by mouth daily.   5   No current facility-administered medications on file prior to visit.    ALLERGIES: Allergies  Allergen Reactions   Penicillins Rash and Other (See Comments)    PATIENT HAS HAD A PCN REACTION WITH IMMEDIATE RASH, FACIAL/TONGUE/THROAT SWELLING, SOB, OR LIGHTHEADEDNESS WITH HYPOTENSION:  #  #  #  YES  #  #  #  Has patient had a PCN reaction causing severe rash involving mucus membranes or skin necrosis: NO Has patient had a PCN reaction that required hospitalization NO Has patient had a PCN reaction occurring within the last 10 years: NO   Tramadol Itching   Codeine Rash    FAMILY HISTORY: Family History  Problem Relation Age of Onset   Diabetes Mother    Diabetes Sister    Hyperlipidemia Sister    Diabetes Brother    Diabetes Son    Cancer Neg Hx       Objective:  *** General: No acute distress.  Patient appears ***-groomed.   Head:  Normocephalic/atraumatic Eyes:  Fundi examined but not visualized Neck: supple, no paraspinal tenderness, full range of motion Heart:  Regular rate and rhythm Lungs:  Clear to auscultation bilaterally Back: No paraspinal tenderness Neurological Exam: alert and oriented to person, place, and time.  Speech fluent and not dysarthric, language intact.  CN II-XII intact. Bulk and tone normal, muscle strength 5/5 throughout.  Sensation to light touch intact.  Deep  tendon reflexes 2+ throughout, toes downgoing.  Finger to nose testing intact.  Gait normal, Romberg negative.   Metta Clines, DO  CC: ***

## 2021-12-30 ENCOUNTER — Encounter: Payer: Self-pay | Admitting: Neurology

## 2021-12-30 ENCOUNTER — Ambulatory Visit: Payer: Medicare PPO | Admitting: Neurology

## 2021-12-30 VITALS — BP 131/65 | HR 69 | Ht 69.0 in | Wt 149.8 lb

## 2021-12-30 DIAGNOSIS — I609 Nontraumatic subarachnoid hemorrhage, unspecified: Secondary | ICD-10-CM

## 2021-12-30 DIAGNOSIS — G44309 Post-traumatic headache, unspecified, not intractable: Secondary | ICD-10-CM

## 2021-12-30 DIAGNOSIS — M542 Cervicalgia: Secondary | ICD-10-CM | POA: Diagnosis not present

## 2022-01-29 ENCOUNTER — Ambulatory Visit: Payer: Medicare PPO | Admitting: Podiatry

## 2022-01-29 DIAGNOSIS — E119 Type 2 diabetes mellitus without complications: Secondary | ICD-10-CM

## 2022-01-29 DIAGNOSIS — B351 Tinea unguium: Secondary | ICD-10-CM | POA: Diagnosis not present

## 2022-01-29 DIAGNOSIS — E1151 Type 2 diabetes mellitus with diabetic peripheral angiopathy without gangrene: Secondary | ICD-10-CM | POA: Diagnosis not present

## 2022-01-29 DIAGNOSIS — B353 Tinea pedis: Secondary | ICD-10-CM | POA: Diagnosis not present

## 2022-01-29 DIAGNOSIS — M79675 Pain in left toe(s): Secondary | ICD-10-CM

## 2022-01-29 DIAGNOSIS — M79674 Pain in right toe(s): Secondary | ICD-10-CM | POA: Diagnosis not present

## 2022-01-29 MED ORDER — KETOCONAZOLE 2 % EX CREA: TOPICAL_CREAM | CUTANEOUS | 1 refills | Status: AC

## 2022-01-29 NOTE — Patient Instructions (Signed)
Athlete's Foot Athlete's foot (tinea pedis) is a fungal infection of the skin on your feet. It often occurs on the skin that is between or underneath the toes. It can also occur on the soles of your feet. The infection can spread from person to person (is contagious). It can also spread when a person's bare feet come in contact with the fungus on shower floors or on items such as shoes. What are the causes? This condition is caused by a fungus that grows in warm, moist places. You can get athlete's foot by sharing shoes, shower stalls, towels, and wet floors with someone who is infected. Not washing your feet or changing your socks often enough can also lead to athlete's foot. What increases the risk? This condition is more likely to develop in: Men. People who have a weak body defense system (immune system). People who have diabetes. People who use public showers, such as at a gym. People who wear heavy-duty shoes, such as industrial or military shoes. Seasons with warm, humid weather. What are the signs or symptoms? Symptoms of this condition include: Itchy areas between your toes or on the soles of your feet. White, flaky, or scaly areas between your toes or on the soles of your feet. Very itchy small blisters between your toes or on the soles of your feet. Small cuts in your skin. These cuts can become infected. Thick or discolored toenails. How is this diagnosed? This condition may be diagnosed with a physical exam and a review of your medical history. Your health care provider may also take a skin or toenail sample to examine under a microscope. How is this treated? This condition is treated with antifungal medicines. These may be applied as powders, ointments, or creams. In severe cases, an oral antifungal medicine may be given. Follow these instructions at home: Medicines Apply or take over-the-counter and prescription medicines only as told by your health care provider. Apply your  antifungal medicine as told by your health care provider. Do not stop using the antifungal even if your condition improves. Foot care Do not scratch your feet. Keep your feet dry: Wear cotton or wool socks. Change your socks every day or if they become wet. Wear shoes that allow air to flow, such as sandals or canvas tennis shoes. Wash and dry your feet, including the area between your toes. Also, wash and dry your feet: Every day or as told by your health care provider. After exercising. General instructions Do not let others use towels, shoes, nail clippers, or other personal items that touch your feet. Protect your feet by wearing sandals in wet areas, such as locker rooms and shared showers. Keep all follow-up visits. This is important. If you have diabetes, keep your blood sugar under control. Contact a health care provider if: You have a fever. You have swelling, soreness, warmth, or redness in your foot. Your feet are not getting better with treatment. Your symptoms get worse. You have new symptoms. You have severe pain. Summary Athlete's foot (tinea pedis) is a fungal infection of the skin on your feet. It often occurs on skin that is between or underneath the toes. This condition is caused by a fungus that grows in warm, moist places. Symptoms include white, flaky, or scaly areas between your toes or on the soles of your feet. This condition is treated with antifungal medicines. Keep your feet clean. Always dry them thoroughly. This information is not intended to replace advice given to you by   your health care provider. Make sure you discuss any questions you have with your health care provider. Document Revised: 11/09/2020 Document Reviewed: 11/09/2020 Elsevier Patient Education  2023 Elsevier Inc.  

## 2022-02-06 ENCOUNTER — Inpatient Hospital Stay (HOSPITAL_COMMUNITY)
Admission: EM | Admit: 2022-02-06 | Discharge: 2022-03-02 | DRG: 871 | Disposition: E | Payer: Medicare PPO | Attending: Internal Medicine | Admitting: Internal Medicine

## 2022-02-06 ENCOUNTER — Emergency Department (HOSPITAL_COMMUNITY): Payer: Medicare PPO

## 2022-02-06 ENCOUNTER — Other Ambulatory Visit: Payer: Self-pay

## 2022-02-06 ENCOUNTER — Encounter (HOSPITAL_COMMUNITY): Payer: Self-pay

## 2022-02-06 DIAGNOSIS — I959 Hypotension, unspecified: Secondary | ICD-10-CM

## 2022-02-06 DIAGNOSIS — K5652 Intestinal adhesions [bands] with complete obstruction: Secondary | ICD-10-CM | POA: Diagnosis present

## 2022-02-06 DIAGNOSIS — J9601 Acute respiratory failure with hypoxia: Secondary | ICD-10-CM | POA: Diagnosis not present

## 2022-02-06 DIAGNOSIS — E1122 Type 2 diabetes mellitus with diabetic chronic kidney disease: Secondary | ICD-10-CM | POA: Diagnosis present

## 2022-02-06 DIAGNOSIS — Z96651 Presence of right artificial knee joint: Secondary | ICD-10-CM | POA: Diagnosis present

## 2022-02-06 DIAGNOSIS — E872 Acidosis, unspecified: Secondary | ICD-10-CM | POA: Diagnosis present

## 2022-02-06 DIAGNOSIS — Z515 Encounter for palliative care: Secondary | ICD-10-CM

## 2022-02-06 DIAGNOSIS — E875 Hyperkalemia: Secondary | ICD-10-CM | POA: Diagnosis not present

## 2022-02-06 DIAGNOSIS — I2510 Atherosclerotic heart disease of native coronary artery without angina pectoris: Secondary | ICD-10-CM | POA: Diagnosis present

## 2022-02-06 DIAGNOSIS — K559 Vascular disorder of intestine, unspecified: Secondary | ICD-10-CM | POA: Diagnosis present

## 2022-02-06 DIAGNOSIS — Z83438 Family history of other disorder of lipoprotein metabolism and other lipidemia: Secondary | ICD-10-CM

## 2022-02-06 DIAGNOSIS — A419 Sepsis, unspecified organism: Principal | ICD-10-CM | POA: Diagnosis present

## 2022-02-06 DIAGNOSIS — Z66 Do not resuscitate: Secondary | ICD-10-CM | POA: Diagnosis not present

## 2022-02-06 DIAGNOSIS — D62 Acute posthemorrhagic anemia: Secondary | ICD-10-CM | POA: Diagnosis not present

## 2022-02-06 DIAGNOSIS — R6521 Severe sepsis with septic shock: Secondary | ICD-10-CM | POA: Diagnosis present

## 2022-02-06 DIAGNOSIS — R7401 Elevation of levels of liver transaminase levels: Secondary | ICD-10-CM | POA: Diagnosis present

## 2022-02-06 DIAGNOSIS — J45909 Unspecified asthma, uncomplicated: Secondary | ICD-10-CM | POA: Diagnosis present

## 2022-02-06 DIAGNOSIS — F17210 Nicotine dependence, cigarettes, uncomplicated: Secondary | ICD-10-CM | POA: Diagnosis present

## 2022-02-06 DIAGNOSIS — K56609 Unspecified intestinal obstruction, unspecified as to partial versus complete obstruction: Secondary | ICD-10-CM | POA: Diagnosis present

## 2022-02-06 DIAGNOSIS — E785 Hyperlipidemia, unspecified: Secondary | ICD-10-CM

## 2022-02-06 DIAGNOSIS — N179 Acute kidney failure, unspecified: Secondary | ICD-10-CM | POA: Diagnosis present

## 2022-02-06 DIAGNOSIS — Z880 Allergy status to penicillin: Secondary | ICD-10-CM

## 2022-02-06 DIAGNOSIS — I468 Cardiac arrest due to other underlying condition: Secondary | ICD-10-CM | POA: Diagnosis not present

## 2022-02-06 DIAGNOSIS — E1165 Type 2 diabetes mellitus with hyperglycemia: Secondary | ICD-10-CM | POA: Diagnosis present

## 2022-02-06 DIAGNOSIS — Z8673 Personal history of transient ischemic attack (TIA), and cerebral infarction without residual deficits: Secondary | ICD-10-CM

## 2022-02-06 DIAGNOSIS — Z833 Family history of diabetes mellitus: Secondary | ICD-10-CM

## 2022-02-06 DIAGNOSIS — K219 Gastro-esophageal reflux disease without esophagitis: Secondary | ICD-10-CM | POA: Diagnosis present

## 2022-02-06 DIAGNOSIS — Z902 Acquired absence of lung [part of]: Secondary | ICD-10-CM

## 2022-02-06 DIAGNOSIS — R579 Shock, unspecified: Secondary | ICD-10-CM | POA: Diagnosis not present

## 2022-02-06 DIAGNOSIS — I129 Hypertensive chronic kidney disease with stage 1 through stage 4 chronic kidney disease, or unspecified chronic kidney disease: Secondary | ICD-10-CM | POA: Diagnosis present

## 2022-02-06 DIAGNOSIS — N189 Chronic kidney disease, unspecified: Secondary | ICD-10-CM | POA: Diagnosis present

## 2022-02-06 DIAGNOSIS — K631 Perforation of intestine (nontraumatic): Secondary | ICD-10-CM | POA: Diagnosis present

## 2022-02-06 DIAGNOSIS — Z85118 Personal history of other malignant neoplasm of bronchus and lung: Secondary | ICD-10-CM

## 2022-02-06 DIAGNOSIS — G9341 Metabolic encephalopathy: Secondary | ICD-10-CM | POA: Diagnosis not present

## 2022-02-06 DIAGNOSIS — R7989 Other specified abnormal findings of blood chemistry: Secondary | ICD-10-CM | POA: Diagnosis present

## 2022-02-06 DIAGNOSIS — Z885 Allergy status to narcotic agent status: Secondary | ICD-10-CM

## 2022-02-06 DIAGNOSIS — Z955 Presence of coronary angioplasty implant and graft: Secondary | ICD-10-CM

## 2022-02-06 DIAGNOSIS — I469 Cardiac arrest, cause unspecified: Secondary | ICD-10-CM | POA: Diagnosis not present

## 2022-02-06 LAB — CBC
HCT: 39 % (ref 36.0–46.0)
Hemoglobin: 11.8 g/dL — ABNORMAL LOW (ref 12.0–15.0)
MCH: 25.9 pg — ABNORMAL LOW (ref 26.0–34.0)
MCHC: 30.3 g/dL (ref 30.0–36.0)
MCV: 85.7 fL (ref 80.0–100.0)
Platelets: 335 10*3/uL (ref 150–400)
RBC: 4.55 MIL/uL (ref 3.87–5.11)
RDW: 15.3 % (ref 11.5–15.5)
WBC: 9.2 10*3/uL (ref 4.0–10.5)
nRBC: 0 % (ref 0.0–0.2)

## 2022-02-06 LAB — I-STAT CHEM 8, ED
BUN: 33 mg/dL — ABNORMAL HIGH (ref 8–23)
Calcium, Ion: 1.1 mmol/L — ABNORMAL LOW (ref 1.15–1.40)
Chloride: 112 mmol/L — ABNORMAL HIGH (ref 98–111)
Creatinine, Ser: 1.4 mg/dL — ABNORMAL HIGH (ref 0.44–1.00)
Glucose, Bld: 191 mg/dL — ABNORMAL HIGH (ref 70–99)
HCT: 43 % (ref 36.0–46.0)
Hemoglobin: 14.6 g/dL (ref 12.0–15.0)
Potassium: 4.2 mmol/L (ref 3.5–5.1)
Sodium: 143 mmol/L (ref 135–145)
TCO2: 19 mmol/L — ABNORMAL LOW (ref 22–32)

## 2022-02-06 LAB — CBC WITH DIFFERENTIAL/PLATELET
Abs Immature Granulocytes: 0.03 10*3/uL (ref 0.00–0.07)
Basophils Absolute: 0 10*3/uL (ref 0.0–0.1)
Basophils Relative: 0 %
Eosinophils Absolute: 0.1 10*3/uL (ref 0.0–0.5)
Eosinophils Relative: 1 %
HCT: 41.4 % (ref 36.0–46.0)
Hemoglobin: 12.9 g/dL (ref 12.0–15.0)
Immature Granulocytes: 0 %
Lymphocytes Relative: 19 %
Lymphs Abs: 1.6 10*3/uL (ref 0.7–4.0)
MCH: 25.7 pg — ABNORMAL LOW (ref 26.0–34.0)
MCHC: 31.2 g/dL (ref 30.0–36.0)
MCV: 82.5 fL (ref 80.0–100.0)
Monocytes Absolute: 0.3 10*3/uL (ref 0.1–1.0)
Monocytes Relative: 3 %
Neutro Abs: 6.3 10*3/uL (ref 1.7–7.7)
Neutrophils Relative %: 77 %
Platelets: 363 10*3/uL (ref 150–400)
RBC: 5.02 MIL/uL (ref 3.87–5.11)
RDW: 15.2 % (ref 11.5–15.5)
WBC: 8.4 10*3/uL (ref 4.0–10.5)
nRBC: 0 % (ref 0.0–0.2)

## 2022-02-06 LAB — COMPREHENSIVE METABOLIC PANEL
ALT: 9 U/L (ref 0–44)
AST: 13 U/L — ABNORMAL LOW (ref 15–41)
Albumin: 3.9 g/dL (ref 3.5–5.0)
Alkaline Phosphatase: 60 U/L (ref 38–126)
Anion gap: 11 (ref 5–15)
BUN: 35 mg/dL — ABNORMAL HIGH (ref 8–23)
CO2: 19 mmol/L — ABNORMAL LOW (ref 22–32)
Calcium: 9.2 mg/dL (ref 8.9–10.3)
Chloride: 111 mmol/L (ref 98–111)
Creatinine, Ser: 1.34 mg/dL — ABNORMAL HIGH (ref 0.44–1.00)
GFR, Estimated: 39 mL/min — ABNORMAL LOW (ref >60)
Glucose, Bld: 187 mg/dL — ABNORMAL HIGH (ref 70–99)
Potassium: 4.1 mmol/L (ref 3.5–5.1)
Sodium: 141 mmol/L (ref 135–145)
Total Bilirubin: 0.6 mg/dL (ref 0.3–1.2)
Total Protein: 8.3 g/dL — ABNORMAL HIGH (ref 6.5–8.1)

## 2022-02-06 LAB — URINALYSIS, ROUTINE W REFLEX MICROSCOPIC
Bacteria, UA: NONE SEEN
Bilirubin Urine: NEGATIVE
Glucose, UA: NEGATIVE mg/dL
Ketones, ur: 5 mg/dL — AB
Leukocytes,Ua: NEGATIVE
Nitrite: NEGATIVE
Protein, ur: 30 mg/dL — AB
Specific Gravity, Urine: 1.039 — ABNORMAL HIGH (ref 1.005–1.030)
pH: 5 (ref 5.0–8.0)

## 2022-02-06 LAB — CREATININE, SERUM
Creatinine, Ser: 1.46 mg/dL — ABNORMAL HIGH (ref 0.44–1.00)
GFR, Estimated: 35 mL/min — ABNORMAL LOW (ref >60)

## 2022-02-06 LAB — LIPASE, BLOOD: Lipase: 32 U/L (ref 11–51)

## 2022-02-06 MED ORDER — FENTANYL CITRATE PF 50 MCG/ML IJ SOSY
50.0000 ug | PREFILLED_SYRINGE | Freq: Once | INTRAMUSCULAR | Status: DC
  Filled 2022-02-06: qty 1

## 2022-02-06 MED ORDER — MAGNESIUM HYDROXIDE 400 MG/5ML PO SUSP: 30.0000 mL | Freq: Every day | ORAL | Status: DC | PRN

## 2022-02-06 MED ORDER — IOHEXOL 300 MG/ML  SOLN
100.0000 mL | Freq: Once | INTRAMUSCULAR | Status: AC | PRN
  Administered 2022-02-06: 80 mL via INTRAVENOUS

## 2022-02-06 MED ORDER — MIDODRINE HCL 5 MG PO TABS: 10.0000 mg | ORAL_TABLET | Freq: Three times a day (TID) | ORAL | Status: DC

## 2022-02-06 MED ORDER — METOPROLOL SUCCINATE ER 50 MG PO TB24: 25.0000 mg | ORAL_TABLET | Freq: Every evening | ORAL | Status: DC

## 2022-02-06 MED ORDER — ACETAMINOPHEN 325 MG PO TABS: 650.0000 mg | ORAL_TABLET | Freq: Four times a day (QID) | ORAL | Status: DC | PRN

## 2022-02-06 MED ORDER — SODIUM CHLORIDE 0.9 % IV BOLUS: 1000.0000 mL | Freq: Once | INTRAVENOUS | Status: DC

## 2022-02-06 MED ORDER — VITAMIN D 25 MCG (1000 UNIT) PO TABS: 5000.0000 [IU] | ORAL_TABLET | Freq: Every day | ORAL | Status: DC

## 2022-02-06 MED ORDER — SODIUM CHLORIDE 0.9 % IV SOLN
2.0000 g | Freq: Once | INTRAVENOUS | Status: AC
  Administered 2022-02-07: 2 g via INTRAVENOUS
  Filled 2022-02-06: qty 12.5

## 2022-02-06 MED ORDER — NOREPINEPHRINE 4 MG/250ML-% IV SOLN
0.0000 ug/min | INTRAVENOUS | Status: DC
  Administered 2022-02-07: 75 ug/min via INTRAVENOUS
  Administered 2022-02-07: 2 ug/min via INTRAVENOUS
  Administered 2022-02-07: 40 ug/min via INTRAVENOUS
  Administered 2022-02-07: 75 ug/min via INTRAVENOUS
  Filled 2022-02-06 (×4): qty 250

## 2022-02-06 MED ORDER — ONDANSETRON 4 MG PO TBDP
4.0000 mg | ORAL_TABLET | Freq: Once | ORAL | Status: AC
Start: 2022-02-06 — End: 2022-02-06
  Administered 2022-02-06: 4 mg via ORAL
  Filled 2022-02-06: qty 1

## 2022-02-06 MED ORDER — MIDODRINE HCL 5 MG PO TABS
10.0000 mg | ORAL_TABLET | Freq: Three times a day (TID) | ORAL | Status: DC
  Administered 2022-02-06: 10 mg via ORAL
  Filled 2022-02-06: qty 2

## 2022-02-06 MED ORDER — SODIUM CHLORIDE 0.9 % IV BOLUS
500.0000 mL | Freq: Once | INTRAVENOUS | Status: AC
  Administered 2022-02-06: 500 mL via INTRAVENOUS

## 2022-02-06 MED ORDER — NITROGLYCERIN 0.4 MG SL SUBL: 0.4000 mg | SUBLINGUAL_TABLET | SUBLINGUAL | Status: DC | PRN

## 2022-02-06 MED ORDER — SODIUM CHLORIDE 0.9 % IV SOLN: INTRAVENOUS | Status: DC

## 2022-02-06 MED ORDER — ENOXAPARIN SODIUM 40 MG/0.4ML IJ SOSY
40.0000 mg | PREFILLED_SYRINGE | INTRAMUSCULAR | Status: DC
  Administered 2022-02-06: 40 mg via SUBCUTANEOUS
  Filled 2022-02-06: qty 0.4

## 2022-02-06 MED ORDER — ACETAMINOPHEN 650 MG RE SUPP: 650.0000 mg | Freq: Four times a day (QID) | RECTAL | Status: DC | PRN

## 2022-02-06 MED ORDER — ONDANSETRON HCL 4 MG/2ML IJ SOLN
4.0000 mg | Freq: Four times a day (QID) | INTRAMUSCULAR | Status: DC | PRN
  Administered 2022-02-06: 4 mg via INTRAVENOUS
  Filled 2022-02-06: qty 2

## 2022-02-06 MED ORDER — TRAZODONE HCL 50 MG PO TABS: 25.0000 mg | ORAL_TABLET | Freq: Every evening | ORAL | Status: DC | PRN

## 2022-02-06 MED ORDER — SODIUM CHLORIDE 0.9 % IV SOLN: INTRAVENOUS | Status: AC

## 2022-02-06 MED ORDER — VANCOMYCIN HCL IN DEXTROSE 1-5 GM/200ML-% IV SOLN
1000.0000 mg | Freq: Once | INTRAVENOUS | Status: DC
  Filled 2022-02-06: qty 200

## 2022-02-06 MED ORDER — ONDANSETRON HCL 4 MG PO TABS: 4.0000 mg | ORAL_TABLET | Freq: Four times a day (QID) | ORAL | Status: DC | PRN

## 2022-02-06 MED ORDER — MOMETASONE FURO-FORMOTEROL FUM 100-5 MCG/ACT IN AERO
2.0000 | INHALATION_SPRAY | Freq: Two times a day (BID) | RESPIRATORY_TRACT | Status: DC
  Filled 2022-02-06: qty 8.8

## 2022-02-06 MED ORDER — SODIUM CHLORIDE 0.9 % IV BOLUS
1000.0000 mL | Freq: Once | INTRAVENOUS | Status: AC
  Administered 2022-02-06: 1000 mL via INTRAVENOUS

## 2022-02-06 MED ORDER — METRONIDAZOLE 500 MG/100ML IV SOLN
500.0000 mg | Freq: Two times a day (BID) | INTRAVENOUS | Status: DC
  Administered 2022-02-07: 500 mg via INTRAVENOUS
  Filled 2022-02-06: qty 100

## 2022-02-06 MED ORDER — SODIUM CHLORIDE 0.9 % IV BOLUS
250.0000 mL | Freq: Once | INTRAVENOUS | Status: AC
  Administered 2022-02-06: 250 mL via INTRAVENOUS

## 2022-02-06 MED ORDER — ATORVASTATIN CALCIUM 40 MG PO TABS: 40.0000 mg | ORAL_TABLET | Freq: Every day | ORAL | Status: DC

## 2022-02-06 MED ORDER — TORSEMIDE 20 MG PO TABS: 20.0000 mg | ORAL_TABLET | ORAL | Status: DC

## 2022-02-06 MED ORDER — SODIUM CHLORIDE (PF) 0.9 % IJ SOLN
INTRAMUSCULAR | Status: AC
  Filled 2022-02-06: qty 50

## 2022-02-06 NOTE — ED Notes (Signed)
Secure chat sent to Hospitalist regarding pt BP

## 2022-02-06 NOTE — ED Provider Triage Note (Addendum)
Emergency Medicine Provider Triage Evaluation Note  [name] [name] , a 87 y.o. female  was evaluated in triage.  Pt complains of LLQ abdominal pain as well as nausea and vomiting. The patient reports she went out to breakfast and had [place] sausage and eggs, about 2 hours later the patient started to have nausea and vomiting. Denies any changes to her bowels. Denies any hematemesis. Denies any fever. Other members that ate there are not sick.   Review of Systems  Positive:  Negative:   Physical Exam  BP (!) 158/72   Pulse (!) 55   Resp 18   SpO2 95%  Gen:   Awake, no distress   Resp:  Normal effort  MSK:   Moves extremities without difficulty  Other:  LLQ tenderness to palpation. Abdomen soft without guarding oir rebound. Patient is belching  Medical Decision Making  Medically screening exam initiated at 4:19 PM.  Appropriate orders placed.  ELYSIA [name] was informed that the remainder of the evaluation will be completed by another provider, this initial triage assessment does not replace that evaluation, and the importance of remaining in the ED until their evaluation is complete.  Labs and imaging ordered.    [name] Puller, PA-C 02/02/2022 1622   ADDENDUM: Temperature order was placed on my intial evaluation as the patient did not have it collected. The tech reports that  he was having difficulty obtaining a temperature. Rectal temperature was obtained at 96.36F. Patient does feel cool.    [name] [name], [place] 02/18/2022 [phone]

## 2022-02-06 NOTE — ED Notes (Signed)
[name] [name], [place] aware pts rectal temp 96.8

## 2022-02-06 NOTE — Assessment & Plan Note (Signed)
-   We will continue her as needed sublingual nitroglycerin use, Toprol-XL and statin therapy. - We will hold off aspirin for now.

## 2022-02-06 NOTE — Consult Note (Signed)
CC: abd pain, nausea, vomiting  HPI: [name] [name] is an 87 y.o. female who is here for abdominal pain and vomiting.  Patient states that she was feeling well leading up until today.  She went out to breakfast and had [place] sausage and eggs.  2 hours later she started having nausea and vomiting.  States her last bowel movement was this morning, and was normal for her.  h/o hysterectomy and cholecystectomy  Past Medical History:  Diagnosis Date   Anginal pain ([place])    Arthritis    Asthma    Coronary artery disease    Diabetes mellitus    Dyspnea    Early cataracts, bilateral    GERD (gastroesophageal reflux disease)    occ   Hypertension    Lung cancer ([facility])    Mass of upper lobe of left lung    mediastinal adenopathy   Palpitations    Pneumonia    Stroke ([place])    mini stroke   Wears dentures    Wears glasses     Past Surgical History:  Procedure Laterality Date   ABDOMINAL HYSTERECTOMY     ANKLE SURGERY Left    fusion   COLONOSCOPY W/ BIOPSIES AND POLYPECTOMY     CORONARY ATHERECTOMY N/A 12/11/2021   Procedure: CORONARY ATHERECTOMY;  Surgeon: Leonie Man, MD;  Location: [facility];  Service: Cardiovascular;  Laterality: N/A;   CORONARY STENT INTERVENTION N/A 12/09/2021   Procedure: CORONARY STENT INTERVENTION;  Surgeon: Wellington Hampshire, MD;  Location: [facility];  Service: Cardiovascular;  Laterality: N/A;   CORONARY THROMBECTOMY N/A 12/11/2021   Procedure: Coronary Thrombectomy;  Surgeon: Leonie Man, MD;  Location: [facility];  Service: Cardiovascular;  Laterality: N/A;   IR ANGIO EXTRACRAN SEL COM CAROTID INNOMINATE UNI BILAT MOD SED  11/30/2018   IR ANGIO VERTEBRAL SEL VERTEBRAL BILAT MOD SED  11/30/2018   IR ANGIOGRAM SELECTIVE EACH ADDITIONAL VESSEL  11/30/2018   MULTIPLE TOOTH EXTRACTIONS     RIGHT/LEFT HEART CATH AND CORONARY ANGIOGRAPHY N/A 12/09/2021   Procedure: RIGHT/LEFT HEART CATH AND CORONARY ANGIOGRAPHY;  Surgeon:  Dixie Dials, MD;  Location: [facility];  Service: Cardiovascular;  Laterality: N/A;   TOTAL KNEE ARTHROPLASTY Right    VIDEO ASSISTED THORACOSCOPY (VATS)/ LOBECTOMY Left 04/21/2017   Procedure: VIDEO ASSISTED THORACOSCOPY (VATS)/LEFT UPPER LOBECTOMY;  Surgeon: Melrose Nakayama, MD;  Location: [facility];  Service: Thoracic;  Laterality: Left;   VIDEO BRONCHOSCOPY WITH ENDOBRONCHIAL NAVIGATION N/A 01/21/2017   Procedure: VIDEO BRONCHOSCOPY WITH ENDOBRONCHIAL NAVIGATION;  Surgeon: Melrose Nakayama, MD;  Location: [facility];  Service: Thoracic;  Laterality: N/A;   VIDEO BRONCHOSCOPY WITH ENDOBRONCHIAL ULTRASOUND N/A 01/21/2017   Procedure: VIDEO BRONCHOSCOPY WITH ENDOBRONCHIAL ULTRASOUND;  Surgeon: Melrose Nakayama, MD;  Location: [facility];  Service: Thoracic;  Laterality: N/A;    Family History  Problem Relation Age of Onset   Diabetes Mother    Diabetes Sister    Hyperlipidemia Sister    Diabetes Brother    Diabetes Son    Cancer Neg Hx     Social:  reports that she has been smoking cigarettes. She has a 3.00 pack-year smoking history. She has never used smokeless tobacco. She reports that she does not drink alcohol and does not use drugs.  Allergies:  Allergies  Allergen Reactions   Penicillins Rash and Other (See Comments)    PATIENT HAS HAD A PCN REACTION WITH IMMEDIATE RASH, FACIAL/TONGUE/THROAT SWELLING,  SOB, OR LIGHTHEADEDNESS WITH HYPOTENSION:  #  #  #  YES  #  #  #  Has patient had a PCN reaction causing severe rash involving mucus membranes or skin necrosis: NO Has patient had a PCN reaction that required hospitalization NO Has patient had a PCN reaction occurring within the last 10 years: NO   Tramadol Itching   Codeine Rash    Medications: I have reviewed the patient's current medications.  Results for orders placed or performed during the hospital encounter of 02/28/2022 (from the past 48 hour(s))  CBC with Differential     Status: Abnormal   Collection Time:  02/24/2022  4:57 PM  Result Value Ref Range   WBC 8.4 4.0 - 10.5 K/uL   RBC 5.02 3.87 - 5.11 MIL/uL   Hemoglobin 12.9 12.0 - 15.0 g/dL   HCT 41.4 36.0 - 46.0 %   MCV 82.5 80.0 - 100.0 fL   MCH 25.7 (L) 26.0 - 34.0 pg   MCHC 31.2 30.0 - 36.0 g/dL   RDW 15.2 11.5 - 15.5 %   Platelets 363 150 - 400 K/uL   nRBC 0.0 0.0 - 0.2 %   Neutrophils Relative % 77 %   Neutro Abs 6.3 1.7 - 7.7 K/uL   Lymphocytes Relative 19 %   Lymphs Abs 1.6 0.7 - 4.0 K/uL   Monocytes Relative 3 %   Monocytes Absolute 0.3 0.1 - 1.0 K/uL   Eosinophils Relative 1 %   Eosinophils Absolute 0.1 0.0 - 0.5 K/uL   Basophils Relative 0 %   Basophils Absolute 0.0 0.0 - 0.1 K/uL   Immature Granulocytes 0 %   Abs Immature Granulocytes 0.03 0.00 - 0.07 K/uL    Comment: Performed at [facility], [place] [address] [place], [place] [address]  Comprehensive metabolic panel     Status: Abnormal   Collection Time: 02/21/2022  4:57 PM  Result Value Ref Range   Sodium 141 135 - 145 mmol/L   Potassium 4.1 3.5 - 5.1 mmol/L   Chloride 111 98 - 111 mmol/L   CO2 19 (L) 22 - 32 mmol/L   Glucose, Bld 187 (H) 70 - 99 mg/dL    Comment: Glucose reference range applies only to samples taken after fasting for at least 8 hours.   BUN 35 (H) 8 - 23 mg/dL   Creatinine, Ser 1.34 (H) 0.44 - 1.00 mg/dL   Calcium 9.2 8.9 - 10.3 mg/dL   Total Protein 8.3 (H) 6.5 - 8.1 g/dL   Albumin 3.9 3.5 - 5.0 g/dL   AST 13 (L) 15 - 41 U/L   ALT 9 0 - 44 U/L   Alkaline Phosphatase 60 38 - 126 U/L   Total Bilirubin 0.6 0.3 - 1.2 mg/dL   GFR, Estimated 39 (L) >60 mL/min    Comment: (NOTE) Calculated using the CKD-EPI Creatinine Equation (2021)    Anion gap 11 5 - 15    Comment: Performed at [facility], [place] [address], [place], [place] [address]  Lipase, blood     Status: None   Collection Time: 02/24/2022  4:57 PM  Result Value Ref Range   Lipase 32 11 - 51 U/L    Comment: Performed at [facility],  [place] [address], [place], [place] [address]  I-stat chem 8, ED (not at [facility] or [facility])     Status: Abnormal   Collection Time: 02/24/2022  5:05 PM  Result Value Ref Range  Sodium 143 135 - 145 mmol/L   Potassium 4.2 3.5 - 5.1 mmol/L   Chloride 112 (H) 98 - 111 mmol/L   BUN 33 (H) 8 - 23 mg/dL   Creatinine, Ser 1.40 (H) 0.44 - 1.00 mg/dL   Glucose, Bld 191 (H) 70 - 99 mg/dL    Comment: Glucose reference range applies only to samples taken after fasting for at least 8 hours.   Calcium, Ion 1.10 (L) 1.15 - 1.40 mmol/L   TCO2 19 (L) 22 - 32 mmol/L   Hemoglobin 14.6 12.0 - 15.0 g/dL   HCT 43.0 36.0 - 46.0 %    CT ABDOMEN PELVIS W CONTRAST  Result Date: 02/05/2022 CLINICAL DATA:  Left lower quadrant abdominal pain EXAM: CT ABDOMEN AND PELVIS WITH CONTRAST TECHNIQUE: Multidetector CT imaging of the abdomen and pelvis was performed using the standard protocol following bolus administration of intravenous contrast. RADIATION DOSE REDUCTION: This exam was performed according to the departmental dose-optimization program which includes automated exposure control, adjustment of the mA and/or kV according to patient size and/or use of iterative reconstruction technique. CONTRAST:  9mL OMNIPAQUE IOHEXOL 300 MG/ML  SOLN COMPARISON:  09/27/2008 FINDINGS: Lower chest: No acute abnormality.  Small hiatal hernia.  Emphysema. Hepatobiliary: No focal liver abnormality is seen. Status post cholecystectomy. Postoperative biliary ductal dilatation. Pancreas: Unremarkable. No pancreatic ductal dilatation or surrounding inflammatory changes. Spleen: Normal in size without significant abnormality. Adrenals/Urinary Tract: Adrenal glands are unremarkable. Kidneys are normal, without renal calculi, solid lesion, or hydronephrosis. Bladder is unremarkable. Stomach/Bowel: Stomach is within normal limits. Appendix appears normal. Multiple thickened, mildly distended loops of mid to distal small bowel in the low central abdomen and  pelvis. There appears to be a tightly narrowed transition point in the central pelvis (series 2, image 57). Descending and sigmoid diverticulosis. Vascular/Lymphatic: Aortic atherosclerosis. No enlarged abdominal or pelvic lymph nodes. Reproductive: Status post hysterectomy. Other: No abdominal wall hernia or abnormality. Small volume ascites. Musculoskeletal: No acute or significant osseous findings. IMPRESSION: 1. Multiple thickened, mildly distended loops of mid to distal small bowel in the low central abdomen and pelvis. There appears to be a tightly narrowed transition point in the central pelvis. Findings are consistent with small bowel obstruction. As the proximal small bowel is decompressed, this is concerning for a closed loop obstruction. 2. Small volume ascites likely reactive. 3. Descending and sigmoid diverticulosis without evidence of acute diverticulitis. 4. Cholecystectomy with postoperative biliary ductal dilatation. Aortic Atherosclerosis (ICD10-I70.0) and Emphysema (ICD10-J43.9). Electronically Signed   By: Delanna Ahmadi M.D.   On: 02/10/2022 18:34    ROS - all of the below systems have been reviewed with the patient and positives are indicated with bold text General: chills, fever or night sweats Eyes: blurry vision or double vision ENT: epistaxis or sore throat Allergy/Immunology: itchy/watery eyes or nasal congestion Hematologic/Lymphatic: bleeding problems, blood clots or swollen lymph nodes Endocrine: temperature intolerance or unexpected weight changes Breast: new or changing breast lumps or nipple discharge Resp: cough, shortness of breath, or wheezing CV: chest pain or dyspnea on exertion GI: as per HPI GU: dysuria, trouble voiding, or hematuria MSK: joint pain or joint stiffness Neuro: TIA or stroke symptoms Derm: pruritus and skin lesion changes Psych: anxiety and depression  PE Blood pressure 118/60, pulse 64, temperature (!) 95.4 F (35.2 C), temperature source  Rectal, resp. rate 18, SpO2 97 %. Constitutional: NAD; conversant; no deformities Eyes: Moist conjunctiva; no lid lag; anicteric; PERRL Neck: Trachea midline; no thyromegaly Lungs: Normal respiratory  effort; no tactile fremitus CV: RRR; no palpable thrills; no pitting edema GI: Abd soft, mild TTP; no palpable hepatosplenomegaly MSK: Normal range of motion of extremities; no clubbing/cyanosis Psychiatric: Appropriate affect; alert and oriented x3 Lymphatic: No palpable cervical or axillary lymphadenopathy  Results for orders placed or performed during the hospital encounter of 02/01/2022 (from the past 48 hour(s))  CBC with Differential     Status: Abnormal   Collection Time: 02/09/2022  4:57 PM  Result Value Ref Range   WBC 8.4 4.0 - 10.5 K/uL   RBC 5.02 3.87 - 5.11 MIL/uL   Hemoglobin 12.9 12.0 - 15.0 g/dL   HCT 41.4 36.0 - 46.0 %   MCV 82.5 80.0 - 100.0 fL   MCH 25.7 (L) 26.0 - 34.0 pg   MCHC 31.2 30.0 - 36.0 g/dL   RDW 15.2 11.5 - 15.5 %   Platelets 363 150 - 400 K/uL   nRBC 0.0 0.0 - 0.2 %   Neutrophils Relative % 77 %   Neutro Abs 6.3 1.7 - 7.7 K/uL   Lymphocytes Relative 19 %   Lymphs Abs 1.6 0.7 - 4.0 K/uL   Monocytes Relative 3 %   Monocytes Absolute 0.3 0.1 - 1.0 K/uL   Eosinophils Relative 1 %   Eosinophils Absolute 0.1 0.0 - 0.5 K/uL   Basophils Relative 0 %   Basophils Absolute 0.0 0.0 - 0.1 K/uL   Immature Granulocytes 0 %   Abs Immature Granulocytes 0.03 0.00 - 0.07 K/uL    Comment: Performed at [facility], [place] [address], [place], [place] [address]  Comprehensive metabolic panel     Status: Abnormal   Collection Time: 02/26/2022  4:57 PM  Result Value Ref Range   Sodium 141 135 - 145 mmol/L   Potassium 4.1 3.5 - 5.1 mmol/L   Chloride 111 98 - 111 mmol/L   CO2 19 (L) 22 - 32 mmol/L   Glucose, Bld 187 (H) 70 - 99 mg/dL    Comment: Glucose reference range applies only to samples taken after fasting for at least 8 hours.   BUN 35 (H) 8 - 23  mg/dL   Creatinine, Ser 1.34 (H) 0.44 - 1.00 mg/dL   Calcium 9.2 8.9 - 10.3 mg/dL   Total Protein 8.3 (H) 6.5 - 8.1 g/dL   Albumin 3.9 3.5 - 5.0 g/dL   AST 13 (L) 15 - 41 U/L   ALT 9 0 - 44 U/L   Alkaline Phosphatase 60 38 - 126 U/L   Total Bilirubin 0.6 0.3 - 1.2 mg/dL   GFR, Estimated 39 (L) >60 mL/min    Comment: (NOTE) Calculated using the CKD-EPI Creatinine Equation (2021)    Anion gap 11 5 - 15    Comment: Performed at [facility], [place] [address], [place], [place] [address]  Lipase, blood     Status: None   Collection Time: 02/05/2022  4:57 PM  Result Value Ref Range   Lipase 32 11 - 51 U/L    Comment: Performed at [facility], [place] [address], [place], [place] [address]  I-stat chem 8, ED (not at [facility] or [facility])     Status: Abnormal   Collection Time: 02/13/2022  5:05 PM  Result Value Ref Range   Sodium 143 135 - 145 mmol/L   Potassium 4.2 3.5 - 5.1 mmol/L   Chloride 112 (H) 98 - 111 mmol/L   BUN 33 (H) 8 - 23 mg/dL   Creatinine, Ser  1.40 (H) 0.44 - 1.00 mg/dL   Glucose, Bld 191 (H) 70 - 99 mg/dL    Comment: Glucose reference range applies only to samples taken after fasting for at least 8 hours.   Calcium, Ion 1.10 (L) 1.15 - 1.40 mmol/L   TCO2 19 (L) 22 - 32 mmol/L   Hemoglobin 14.6 12.0 - 15.0 g/dL   HCT 43.0 36.0 - 46.0 %    CT ABDOMEN PELVIS W CONTRAST  Result Date: 02/20/2022 CLINICAL DATA:  Left lower quadrant abdominal pain EXAM: CT ABDOMEN AND PELVIS WITH CONTRAST TECHNIQUE: Multidetector CT imaging of the abdomen and pelvis was performed using the standard protocol following bolus administration of intravenous contrast. RADIATION DOSE REDUCTION: This exam was performed according to the departmental dose-optimization program which includes automated exposure control, adjustment of the mA and/or kV according to patient size and/or use of iterative reconstruction technique. CONTRAST:  41mL OMNIPAQUE IOHEXOL 300 MG/ML  SOLN COMPARISON:   09/27/2008 FINDINGS: Lower chest: No acute abnormality.  Small hiatal hernia.  Emphysema. Hepatobiliary: No focal liver abnormality is seen. Status post cholecystectomy. Postoperative biliary ductal dilatation. Pancreas: Unremarkable. No pancreatic ductal dilatation or surrounding inflammatory changes. Spleen: Normal in size without significant abnormality. Adrenals/Urinary Tract: Adrenal glands are unremarkable. Kidneys are normal, without renal calculi, solid lesion, or hydronephrosis. Bladder is unremarkable. Stomach/Bowel: Stomach is within normal limits. Appendix appears normal. Multiple thickened, mildly distended loops of mid to distal small bowel in the low central abdomen and pelvis. There appears to be a tightly narrowed transition point in the central pelvis (series 2, image 57). Descending and sigmoid diverticulosis. Vascular/Lymphatic: Aortic atherosclerosis. No enlarged abdominal or pelvic lymph nodes. Reproductive: Status post hysterectomy. Other: No abdominal wall hernia or abnormality. Small volume ascites. Musculoskeletal: No acute or significant osseous findings. IMPRESSION: 1. Multiple thickened, mildly distended loops of mid to distal small bowel in the low central abdomen and pelvis. There appears to be a tightly narrowed transition point in the central pelvis. Findings are consistent with small bowel obstruction. As the proximal small bowel is decompressed, this is concerning for a closed loop obstruction. 2. Small volume ascites likely reactive. 3. Descending and sigmoid diverticulosis without evidence of acute diverticulitis. 4. Cholecystectomy with postoperative biliary ductal dilatation. Aortic Atherosclerosis (ICD10-I70.0) and Emphysema (ICD10-J43.9). Electronically Signed   By: Delanna Ahmadi M.D.   On: 01/30/2022 18:34    I have personally reviewed the relevant labs, imaging and ED notes  A/P: [name] [name] is an 87 y.o. female with new onset abd pain and CT concerning for SBO.   Pt is anticoagulated with Elliquis and Plavix.  Abd is soft.  Would recommend NG decompression and hold anticoagulants.  Pt may need surgery once these wear off.  Would plan for OR sooner if signs of decompensation   I spent a total of 47 minutes in both face-to-face and non-face-to-face activities, excluding procedures performed, for this visit on the date of this encounter. Rosario Adie, MD  [facility]

## 2022-02-06 NOTE — ED Triage Notes (Signed)
Pt BIB EMS from home. Pt was vomiting after going out for breakfast. Pt c/o abdominal pain starting today on the left side.

## 2022-02-06 NOTE — Assessment & Plan Note (Signed)
-   The patient will be placed on supplement coverage with NovoLog. - We will plan metformin.

## 2022-02-06 NOTE — ED Provider Notes (Signed)
[facility]-EMERGENCY DEPT Provider Note   CSN: [id number] Arrival date & time: 02/06/22  1556     History  No chief complaint on file.   [name] [name] is a 87 y.o. female who presents the emergency department complaining of abdominal pain and vomiting.  Patient states that she was feeling well leading up until today.  She went out to breakfast and had [place] sausage and eggs.  2 hours later she started having nausea and vomiting.  States her last bowel movement was this morning, and was normal for her.  States that other members of her family that she ate with her not sick.  Denies history of abdominal surgeries.  HPI     Home Medications Prior to Admission medications   Medication Sig Start Date End Date Taking? Authorizing Provider  acetaminophen (TYLENOL) 500 MG tablet Take 1,000 mg by mouth every 6 (six) hours as needed for mild pain or headache.   Yes [provider]  aspirin EC 81 MG tablet Take 81 mg by mouth daily.   Yes [provider]  atorvastatin (LIPITOR) 40 MG tablet Take 1 tablet (40 mg total) by mouth daily at 6 PM. 12/13/21  Yes Orpah Cobb, MD  budesonide-formoterol (SYMBICORT) 80-4.5 MCG/ACT inhaler Inhale 1 puff into the lungs 2 (two) times daily. 12/13/21  Yes Orpah Cobb, MD  Cholecalciferol (VITAMIN D3) 125 MCG (5000 UT) TABS Take 5,000 Units by mouth daily.    Yes [provider]  metFORMIN (GLUCOPHAGE) 500 MG tablet Take 0.5 tablets (250 mg total) by mouth 2 (two) times daily with a meal. 12/13/21  Yes Orpah Cobb, MD  metoprolol succinate (TOPROL-XL) 25 MG 24 hr tablet Take 25 mg by mouth every evening.    Yes [provider]  nicotine (NICODERM CQ - DOSED IN MG/24 HOURS) 14 mg/24hr patch Place 14 mg onto the skin daily.   Yes [provider]  nitroGLYCERIN (NITROSTAT) 0.4 MG SL tablet Place 0.4 mg under the tongue every 5 (five) minutes as needed for chest pain.   Yes [provider]  omeprazole (PRILOSEC) 20 MG capsule Take 20 mg by mouth daily. 11/02/18  Yes [provider]  ondansetron (ZOFRAN-ODT) 8 MG disintegrating tablet Take 8 mg by mouth 2 (two) times daily as needed for nausea or vomiting.   Yes [provider]  torsemide (DEMADEX) 20 MG tablet Take 1 tablet (20 mg total) by mouth every Monday, Wednesday, and Friday. Patient taking differently: Take 20 mg by mouth as directed. Take on Mondays & Thursdays 12/14/21  Yes Orpah Cobb, MD  clopidogrel (PLAVIX) 75 MG tablet Take 1 tablet (75 mg total) by mouth daily with breakfast. Patient not taking: Reported on 02/06/2022 12/13/21   Orpah Cobb, MD  diclofenac Sodium (VOLTAREN) 1 % GEL Apply 4 g topically 4 (four) times daily as needed. Patient not taking: Reported on 02/06/2022 01/13/21   Hilts, Casimiro Needle, MD  ketoconazole (NIZORAL) 2 % cream Apply to both feet and between toes once daily for 6 weeks. Patient not taking: Reported on 02/06/2022 01/29/22   Freddie Breech, DPM      Allergies    Penicillins, Tramadol, and Codeine    Review of Systems   Review of Systems  Constitutional:  Positive for chills. Negative for fever.  Respiratory:  Negative for shortness of breath.   Cardiovascular:  Negative for chest pain.  Gastrointestinal:  Positive for abdominal pain, nausea and vomiting. Negative for blood in stool, constipation  and diarrhea.  Genitourinary:  Negative for dysuria.  All other systems reviewed and are negative.   Physical Exam Updated Vital Signs BP 132/87   Pulse (!) 54   Temp (!) 96.8 F (36 C) (Rectal)   Resp 18   SpO2 95%  Physical Exam Vitals and nursing note reviewed.  Constitutional:      Appearance: Normal appearance.     Comments: Continuously belching  HENT:     Head: Normocephalic and atraumatic.  Eyes:     Conjunctiva/sclera: Conjunctivae normal.  Cardiovascular:     Rate and Rhythm: Normal rate and regular rhythm.  Pulmonary:     Effort: Pulmonary effort  is normal. No respiratory distress.     Breath sounds: Normal breath sounds.  Abdominal:     General: There is no distension.     Palpations: Abdomen is soft.     Tenderness: There is abdominal tenderness in the left lower quadrant. There is no guarding or rebound.  Skin:    General: Skin is warm and dry.  Neurological:     General: No focal deficit present.     Mental Status: She is alert.     ED Results / Procedures / Treatments   Labs (all labs ordered are listed, but only abnormal results are displayed) Labs Reviewed  CBC WITH DIFFERENTIAL/PLATELET - Abnormal; Notable for the following components:      Result Value   MCH 25.7 (*)    All other components within normal limits  COMPREHENSIVE METABOLIC PANEL - Abnormal; Notable for the following components:   CO2 19 (*)    Glucose, Bld 187 (*)    BUN 35 (*)    Creatinine, Ser 1.34 (*)    Total Protein 8.3 (*)    AST 13 (*)    GFR, Estimated 39 (*)    All other components within normal limits  I-STAT CHEM 8, ED - Abnormal; Notable for the following components:   Chloride 112 (*)    BUN 33 (*)    Creatinine, Ser 1.40 (*)    Glucose, Bld 191 (*)    Calcium, Ion 1.10 (*)    TCO2 19 (*)    All other components within normal limits  LIPASE, BLOOD  URINALYSIS, ROUTINE W REFLEX MICROSCOPIC    EKG None  Radiology CT ABDOMEN PELVIS W CONTRAST  Result Date: 03-02-22 CLINICAL DATA:  Left lower quadrant abdominal pain EXAM: CT ABDOMEN AND PELVIS WITH CONTRAST TECHNIQUE: Multidetector CT imaging of the abdomen and pelvis was performed using the standard protocol following bolus administration of intravenous contrast. RADIATION DOSE REDUCTION: This exam was performed according to the departmental dose-optimization program which includes automated exposure control, adjustment of the mA and/or kV according to patient size and/or use of iterative reconstruction technique. CONTRAST:  80mL OMNIPAQUE IOHEXOL 300 MG/ML  SOLN COMPARISON:   09/27/2008 FINDINGS: Lower chest: No acute abnormality.  Small hiatal hernia.  Emphysema. Hepatobiliary: No focal liver abnormality is seen. Status post cholecystectomy. Postoperative biliary ductal dilatation. Pancreas: Unremarkable. No pancreatic ductal dilatation or surrounding inflammatory changes. Spleen: Normal in size without significant abnormality. Adrenals/Urinary Tract: Adrenal glands are unremarkable. Kidneys are normal, without renal calculi, solid lesion, or hydronephrosis. Bladder is unremarkable. Stomach/Bowel: Stomach is within normal limits. Appendix appears normal. Multiple thickened, mildly distended loops of mid to distal small bowel in the low central abdomen and pelvis. There appears to be a tightly narrowed transition point in the central pelvis (series 2, image 57). Descending and sigmoid diverticulosis. Vascular/Lymphatic:  Aortic atherosclerosis. No enlarged abdominal or pelvic lymph nodes. Reproductive: Status post hysterectomy. Other: No abdominal wall hernia or abnormality. Small volume ascites. Musculoskeletal: No acute or significant osseous findings. IMPRESSION: 1. Multiple thickened, mildly distended loops of mid to distal small bowel in the low central abdomen and pelvis. There appears to be a tightly narrowed transition point in the central pelvis. Findings are consistent with small bowel obstruction. As the proximal small bowel is decompressed, this is concerning for a closed loop obstruction. 2. Small volume ascites likely reactive. 3. Descending and sigmoid diverticulosis without evidence of acute diverticulitis. 4. Cholecystectomy with postoperative biliary ductal dilatation. Aortic Atherosclerosis (ICD10-I70.0) and Emphysema (ICD10-J43.9). Electronically Signed   By: Jearld Lesch M.D.   On: 02/06/2022 18:34    Procedures Procedures    Medications Ordered in ED Medications  fentaNYL (SUBLIMAZE) injection 50 mcg (has no administration in time range)  ondansetron  (ZOFRAN-ODT) disintegrating tablet 4 mg (4 mg Oral Given 02/06/22 1715)  sodium chloride (PF) 0.9 % injection (  Given 02/06/22 1818)  iohexol (OMNIPAQUE) 300 MG/ML solution 100 mL (80 mLs Intravenous Contrast Given 02/06/22 1802)    ED Course/ Medical Decision Making/ A&P                           Medical Decision Making Patient is an 87 year old female with history of hypertension, diabetes, CAD (s/p coronary stent in May 2023), and GERD who presents the emergency department complaining of abdominal pain with nausea and vomiting starting this morning after going out to eat for breakfast.  On exam patient has rectal temperature of 96.8 F, placed under bear hugger. Abdomen soft with LLQ tenderness to palpation.  On labs: no leukocytosis, kidney function stable. Liver function grossly normal. Normal lipase.  I personally reviewed patient's CT abdomen/pelvis and agree with the radiologist interpretation of: Multiple thickened distended loops of mid to distal small bowel and the central abdomen/pelvis, with transition point consistent with small bowel obstruction.  Patient given zofran and fentanyl for pain and vomiting.   I consulted with general surgeon Dr Maisie Fus who requested NG tube and will consult upon admission.  I consulted hospitalist Dr Arville Care who will admit. The patient appears reasonably stabilized for admission considering the current resources, flow, and capabilities available in the ED at this time, and I doubt any other [facility] requiring further screening and/or treatment in the ED prior to admission.  Final Clinical Impression(s) / ED Diagnoses Final diagnoses:  Small bowel obstruction (HCC)    Rx / DC Orders ED Discharge Orders     None      Portions of this report may have been transcribed using voice recognition software. Every effort was made to ensure accuracy; however, inadvertent computerized transcription errors may be present.    Jeanella Flattery 02/06/22  1914    Gwyneth Sprout, MD February 20, 2022 1330

## 2022-02-06 NOTE — Assessment & Plan Note (Signed)
-   The patient will be admitted to a medical bed. - This could be secondary to adhesions from previous surgeries. - She will be kept NPO. - We will hydrate with IV normal saline. - We will follow two-view abdomen x-ray in AM.  General surgery consult will be obtained. - Dr. Marcello Moores was notified and is aware about the patient.

## 2022-02-06 NOTE — Assessment & Plan Note (Signed)
-   We will continue statin therapy. 

## 2022-02-06 NOTE — Assessment & Plan Note (Signed)
-   She will be placed on PPI therapy.

## 2022-02-06 NOTE — H&P (Addendum)
[place]   PATIENT NAME: [name] [name]    MR#:  [id number]  DATE OF BIRTH:  1934/12/06  DATE OF ADMISSION:  02/20/2022  PRIMARY CARE PHYSICIAN: Willey Blade, MD   Patient is coming from: Home  REQUESTING/REFERRING PHYSICIAN: Roemhildt, Lorin T, PA-C      CHIEF COMPLAINT:  Abdominal pain   HISTORY OF PRESENT ILLNESS:  [name] [name] is a 87 y.o. female with medical history significant for coronary artery disease, type diabetes mellitus, hypertension, asthma and CVA, present to the ER with acute onset of abdominal pain mainly in the periumbilical since earlier today.  She went out to breakfast and had [place] sausage and egg.  2 hours later she started her last bowel movement was this morning that was normal.  No other family members who ate with became sick.  No dyspnea.  Notes hematuria or flank pain.  No fever or chills.  No bilious vomitus or hematemesis.  No melena or rectal bleeding per rectum.  ED Course: When she came to the ER BP was 158/72 with heart rate of 55 temperature of 96.8 and later 94.9.  Later on BP is currently down to 84/44 and after bolus of normal saline came up to 102/60 and 136/59 then dropped again to 80/52.  Labs revealed a BMP with a BUN of 35 and creatinine 1.3 from better than previous levels with a CO2 of 19 blood glucose of 187.  CBC was within normal.  Imaging: Abdominal pelvic CT scan revealed the following: 1. Multiple thickened, mildly distended loops of mid to distal small bowel in the low central abdomen and pelvis. There appears to be a tightly narrowed transition point in the central pelvis. Findings are consistent with small bowel obstruction. As the proximal small bowel is decompressed, this is concerning for a closed loop obstruction. 2. Small volume ascites likely reactive. 3. Descending and sigmoid diverticulosis without evidence of acute diverticulitis. 4. Cholecystectomy with postoperative biliary ductal dilatation.    Aortic Atherosclerosis (ICD10-I70.0) and Emphysema (ICD10-J43.9).  The patient was given 4 mg of IV Zofran and hydration with IV normal saline.  Medical bed for further evaluation and management   PAST MEDICAL HISTORY:   Past Medical History:  Diagnosis Date   Anginal pain ([place])    Arthritis    Asthma    Coronary artery disease    Diabetes mellitus    Dyspnea    Early cataracts, bilateral    GERD (gastroesophageal reflux disease)    occ   Hypertension    Lung cancer ([facility])    Mass of upper lobe of left lung    mediastinal adenopathy   Palpitations    Pneumonia    Stroke ([place])    mini stroke   Wears dentures    Wears glasses     PAST SURGICAL HISTORY:   Past Surgical History:  Procedure Laterality Date   ABDOMINAL HYSTERECTOMY     ANKLE SURGERY Left    fusion   COLONOSCOPY W/ BIOPSIES AND POLYPECTOMY     CORONARY ATHERECTOMY N/A 12/11/2021   Procedure: CORONARY ATHERECTOMY;  Surgeon: Leonie Man, MD;  Location: [facility];  Service: Cardiovascular;  Laterality: N/A;   CORONARY STENT INTERVENTION N/A 12/09/2021   Procedure: CORONARY STENT INTERVENTION;  Surgeon: Wellington Hampshire, MD;  Location: [facility];  Service: Cardiovascular;  Laterality: N/A;   CORONARY THROMBECTOMY N/A 12/11/2021   Procedure: Coronary Thrombectomy;  Surgeon: Leonie Man, MD;  Location: [facility];  Service: Cardiovascular;  Laterality: N/A;   IR ANGIO EXTRACRAN SEL COM CAROTID INNOMINATE UNI BILAT MOD SED  11/30/2018   IR ANGIO VERTEBRAL SEL VERTEBRAL BILAT MOD SED  11/30/2018   IR ANGIOGRAM SELECTIVE EACH ADDITIONAL VESSEL  11/30/2018   MULTIPLE TOOTH EXTRACTIONS     RIGHT/LEFT HEART CATH AND CORONARY ANGIOGRAPHY N/A 12/09/2021   Procedure: RIGHT/LEFT HEART CATH AND CORONARY ANGIOGRAPHY;  Surgeon: Dixie Dials, MD;  Location: [facility];  Service: Cardiovascular;  Laterality: N/A;   TOTAL KNEE ARTHROPLASTY Right    VIDEO ASSISTED THORACOSCOPY (VATS)/  LOBECTOMY Left 04/21/2017   Procedure: VIDEO ASSISTED THORACOSCOPY (VATS)/LEFT UPPER LOBECTOMY;  Surgeon: Melrose Nakayama, MD;  Location: [facility];  Service: Thoracic;  Laterality: Left;   VIDEO BRONCHOSCOPY WITH ENDOBRONCHIAL NAVIGATION N/A 01/21/2017   Procedure: VIDEO BRONCHOSCOPY WITH ENDOBRONCHIAL NAVIGATION;  Surgeon: Melrose Nakayama, MD;  Location: [facility];  Service: Thoracic;  Laterality: N/A;   VIDEO BRONCHOSCOPY WITH ENDOBRONCHIAL ULTRASOUND N/A 01/21/2017   Procedure: VIDEO BRONCHOSCOPY WITH ENDOBRONCHIAL ULTRASOUND;  Surgeon: Melrose Nakayama, MD;  Location: [facility];  Service: Thoracic;  Laterality: N/A;    SOCIAL HISTORY:   Social History   Tobacco Use   Smoking status: Some Days    Packs/day: 0.25    Years: 12.00    Total pack years: 3.00    Types: Cigarettes    Last attempt to quit: 03/2017    Years since quitting: 4.9   Smokeless tobacco: Never  Substance Use Topics   Alcohol use: No    FAMILY HISTORY:   Family History  Problem Relation Age of Onset   Diabetes Mother    Diabetes Sister    Hyperlipidemia Sister    Diabetes Brother    Diabetes Son    Cancer Neg Hx     DRUG ALLERGIES:   Allergies  Allergen Reactions   Penicillins Rash and Other (See Comments)    PATIENT HAS HAD A PCN REACTION WITH IMMEDIATE RASH, FACIAL/TONGUE/THROAT SWELLING, SOB, OR LIGHTHEADEDNESS WITH HYPOTENSION:  #  #  #  YES  #  #  #  Has patient had a PCN reaction causing severe rash involving mucus membranes or skin necrosis: NO Has patient had a PCN reaction that required hospitalization NO Has patient had a PCN reaction occurring within the last 10 years: NO   Tramadol Itching   Codeine Rash    REVIEW OF SYSTEMS:   ROS As per history of present illness. All pertinent systems were reviewed above. Constitutional, HEENT, cardiovascular, respiratory, GI, GU, musculoskeletal, neuro, psychiatric, endocrine, integumentary and hematologic systems were reviewed and are  otherwise negative/unremarkable except for positive findings mentioned above in the HPI.   MEDICATIONS AT HOME:   Prior to Admission medications   Medication Sig Start Date End Date Taking? Authorizing Provider  acetaminophen (TYLENOL) 500 MG tablet Take 1,000 mg by mouth every 6 (six) hours as needed for mild pain or headache.   Yes [provider]  aspirin EC 81 MG tablet Take 81 mg by mouth daily.   Yes [provider]  atorvastatin (LIPITOR) 40 MG tablet Take 1 tablet (40 mg total) by mouth daily at 6 PM. 12/13/21  Yes Dixie Dials, MD  budesonide-formoterol (SYMBICORT) 80-4.5 MCG/ACT inhaler Inhale 1 puff into the lungs 2 (two) times daily. 12/13/21  Yes Dixie Dials, MD  Cholecalciferol (VITAMIN D3) 125 MCG (5000 UT) TABS Take 5,000 Units by mouth daily.    Yes [provider]  metFORMIN (GLUCOPHAGE) 500 MG tablet Take 0.5 tablets (250 mg total) by mouth 2 (two) times daily with a meal. 12/13/21  Yes Dixie Dials, MD  metoprolol succinate (TOPROL-XL) 25 MG 24 hr tablet Take 25 mg by mouth every evening.    Yes [provider]  nicotine (NICODERM CQ - DOSED IN MG/24 HOURS) 14 mg/24hr patch Place 14 mg onto the skin daily.   Yes [provider]  nitroGLYCERIN (NITROSTAT) 0.4 MG SL tablet Place 0.4 mg under the tongue every 5 (five) minutes as needed for chest pain.   Yes [provider]  omeprazole (PRILOSEC) 20 MG capsule Take 20 mg by mouth daily. 11/02/18  Yes [provider]  ondansetron (ZOFRAN-ODT) 8 MG disintegrating tablet Take 8 mg by mouth 2 (two) times daily as needed for nausea or vomiting.   Yes [provider]  torsemide (DEMADEX) 20 MG tablet Take 1 tablet (20 mg total) by mouth every Monday, Wednesday, and Friday. Patient taking differently: Take 20 mg by mouth as directed. Take on Mondays & Thursdays 12/14/21  Yes Dixie Dials, MD  clopidogrel (PLAVIX) 75 MG tablet Take 1 tablet (75 mg total) by mouth daily  with breakfast. Patient not taking: Reported on 02/08/2022 12/13/21   Dixie Dials, MD  diclofenac Sodium (VOLTAREN) 1 % GEL Apply 4 g topically 4 (four) times daily as needed. Patient not taking: Reported on 02/21/2022 01/13/21   Hilts, Legrand Como, MD  ketoconazole (NIZORAL) 2 % cream Apply to both feet and between toes once daily for 6 weeks. Patient not taking: Reported on 02/11/2022 01/29/22   Marzetta Board, DPM      VITAL SIGNS:  Blood pressure 105/62, pulse 72, temperature (!) 94.9 F (34.9 C), temperature source Rectal, resp. rate 18, SpO2 98 %.  PHYSICAL EXAMINATION:  Physical Exam  GENERAL:  87 y.o.-year-old African-American female patient lying in the bed with no acute distress.  EYES: Pupils equal, round, reactive to light and accommodation. No scleral icterus. Extraocular muscles intact.  HEENT: Head atraumatic, normocephalic. Oropharynx and nasopharynx clear.  NECK:  Supple, no jugular venous distention. No thyroid enlargement, no tenderness.  LUNGS: Normal breath sounds bilaterally, no wheezing, rales,rhonchi or crepitation. No use of accessory muscles of respiration.  CARDIOVASCULAR: Regular rate and rhythm, S1, S2 normal. No murmurs, rubs, or gallops.  ABDOMEN: Soft, nondistended, with epigastric and Local tenderness without rebound tenderness guarding or rigidity.  Bowel sounds are significantly diminished.  No organomegaly or mass.  EXTREMITIES: No pedal edema, cyanosis, or clubbing.  NEUROLOGIC: Cranial nerves II through XII are intact. Muscle strength 5/5 in all extremities. Sensation intact. Gait not checked.  PSYCHIATRIC: The patient is alert and oriented x 3.  Normal affect and good eye contact. SKIN: No obvious rash, lesion, or ulcer.   LABORATORY PANEL:   CBC Recent Labs  Lab 02/04/2022 2052  WBC 9.2  HGB 11.8*  HCT 39.0  PLT 335   ------------------------------------------------------------------------------------------------------------------  Chemistries   Recent Labs  Lab 02/25/2022 1657 01/30/2022 1705 02/19/2022 2052  NA 141 143  --   K 4.1 4.2  --   CL 111 112*  --   CO2 19*  --   --   GLUCOSE 187* 191*  --   BUN 35* 33*  --   CREATININE 1.34* 1.40* 1.46*  CALCIUM 9.2  --   --   AST 13*  --   --   ALT 9  --   --   ALKPHOS 60  --   --  BILITOT 0.6  --   --    ------------------------------------------------------------------------------------------------------------------  Cardiac Enzymes No results for input(s): "TROPONINI" in the last 168 hours. ------------------------------------------------------------------------------------------------------------------  RADIOLOGY:  CT ABDOMEN PELVIS W CONTRAST  Result Date: 02/16/2022 CLINICAL DATA:  Left lower quadrant abdominal pain EXAM: CT ABDOMEN AND PELVIS WITH CONTRAST TECHNIQUE: Multidetector CT imaging of the abdomen and pelvis was performed using the standard protocol following bolus administration of intravenous contrast. RADIATION DOSE REDUCTION: This exam was performed according to the departmental dose-optimization program which includes automated exposure control, adjustment of the mA and/or kV according to patient size and/or use of iterative reconstruction technique. CONTRAST:  4mL OMNIPAQUE IOHEXOL 300 MG/ML  SOLN COMPARISON:  09/27/2008 FINDINGS: Lower chest: No acute abnormality.  Small hiatal hernia.  Emphysema. Hepatobiliary: No focal liver abnormality is seen. Status post cholecystectomy. Postoperative biliary ductal dilatation. Pancreas: Unremarkable. No pancreatic ductal dilatation or surrounding inflammatory changes. Spleen: Normal in size without significant abnormality. Adrenals/Urinary Tract: Adrenal glands are unremarkable. Kidneys are normal, without renal calculi, solid lesion, or hydronephrosis. Bladder is unremarkable. Stomach/Bowel: Stomach is within normal limits. Appendix appears normal. Multiple thickened, mildly distended loops of mid to distal small bowel in  the low central abdomen and pelvis. There appears to be a tightly narrowed transition point in the central pelvis (series 2, image 57). Descending and sigmoid diverticulosis. Vascular/Lymphatic: Aortic atherosclerosis. No enlarged abdominal or pelvic lymph nodes. Reproductive: Status post hysterectomy. Other: No abdominal wall hernia or abnormality. Small volume ascites. Musculoskeletal: No acute or significant osseous findings. IMPRESSION: 1. Multiple thickened, mildly distended loops of mid to distal small bowel in the low central abdomen and pelvis. There appears to be a tightly narrowed transition point in the central pelvis. Findings are consistent with small bowel obstruction. As the proximal small bowel is decompressed, this is concerning for a closed loop obstruction. 2. Small volume ascites likely reactive. 3. Descending and sigmoid diverticulosis without evidence of acute diverticulitis. 4. Cholecystectomy with postoperative biliary ductal dilatation. Aortic Atherosclerosis (ICD10-I70.0) and Emphysema (ICD10-J43.9). Electronically Signed   By: Delanna Ahmadi M.D.   On: 02/02/2022 18:34      IMPRESSION AND PLAN:  Assessment and Plan: * SBO (small bowel obstruction) (Time) - The patient will be admitted to a medical bed. - This could be secondary to adhesions from previous surgeries. - She will be kept NPO. - We will hydrate with IV normal saline. - We will follow two-view abdomen x-ray in AM.  General surgery consult will be obtained. - Dr. Marcello Moores was notified and is aware about the patient.  Coronary artery disease - We will continue her as needed sublingual nitroglycerin use, Toprol-XL and statin therapy. - We will hold off aspirin for now.  Hypotension - We will continue hydration with IV normal saline. - We will place her on midodrine.  Type 2 diabetes mellitus with chronic kidney disease, without long-term current use of insulin ([place]) - The patient will be placed on supplement  coverage with NovoLog. - We will plan metformin.  GERD without esophagitis - She will be placed on PPI therapy.  Dyslipidemia - We will continue statin therapy.   DVT prophylaxis: Lovenox.  Advanced Care Planning:  Code Status: full code.  Family Communication:  The plan of care was discussed in details with the patient (and family). I answered all questions. The patient agreed to proceed with the above mentioned plan. Further management will depend upon hospital course. Disposition Plan: Back to previous home environment Consults called: General surgery. All the  records are reviewed and case discussed with ED provider.  Status is: Inpatient  At the time of the admission, it appears that the appropriate admission status for this patient is inpatient.  This is judged to be reasonable and necessary in order to provide the required intensity of service to ensure the patient's safety given the presenting symptoms, physical exam findings and initial radiographic and laboratory data in the context of comorbid conditions.  The patient requires inpatient status due to high intensity of service, high risk of further deterioration and high frequency of surveillance required.  I certify that at the time of admission, it is my clinical judgment that the patient will require inpatient hospital care extending more than 2 midnights.                            Dispo: The patient is from: Home              Anticipated d/c is to: Home              Patient currently is not medically stable to d/c.              Difficult to place patient: No  Christel Mormon M.D on 02/13/2022 at 10:45 PM  [facility]   From 7 PM-7 AM, contact night-coverage [email or web address]  CC: Primary care physician; Willey Blade, MD

## 2022-02-06 NOTE — Assessment & Plan Note (Addendum)
-   We will continue hydration with IV normal saline. - We will place her on midodrine.

## 2022-02-07 ENCOUNTER — Inpatient Hospital Stay (HOSPITAL_COMMUNITY): Payer: Medicare PPO

## 2022-02-07 ENCOUNTER — Encounter: Payer: Self-pay | Admitting: Podiatry

## 2022-02-07 DIAGNOSIS — K56609 Unspecified intestinal obstruction, unspecified as to partial versus complete obstruction: Secondary | ICD-10-CM | POA: Diagnosis not present

## 2022-02-07 DIAGNOSIS — I469 Cardiac arrest, cause unspecified: Secondary | ICD-10-CM | POA: Diagnosis not present

## 2022-02-07 DIAGNOSIS — R579 Shock, unspecified: Secondary | ICD-10-CM | POA: Diagnosis not present

## 2022-02-07 DIAGNOSIS — J9601 Acute respiratory failure with hypoxia: Secondary | ICD-10-CM | POA: Diagnosis not present

## 2022-02-07 LAB — CBC WITH DIFFERENTIAL/PLATELET
Abs Immature Granulocytes: 0.29 10*3/uL — ABNORMAL HIGH (ref 0.00–0.07)
Abs Immature Granulocytes: 1.18 10*3/uL — ABNORMAL HIGH (ref 0.00–0.07)
Basophils Absolute: 0 10*3/uL (ref 0.0–0.1)
Basophils Absolute: 0.1 10*3/uL (ref 0.0–0.1)
Basophils Relative: 0 %
Basophils Relative: 0 %
Eosinophils Absolute: 0 10*3/uL (ref 0.0–0.5)
Eosinophils Absolute: 0.1 10*3/uL (ref 0.0–0.5)
Eosinophils Relative: 0 %
Eosinophils Relative: 1 %
HCT: 26.7 % — ABNORMAL LOW (ref 36.0–46.0)
HCT: 31.9 % — ABNORMAL LOW (ref 36.0–46.0)
Hemoglobin: 7.8 g/dL — ABNORMAL LOW (ref 12.0–15.0)
Hemoglobin: 9.5 g/dL — ABNORMAL LOW (ref 12.0–15.0)
Immature Granulocytes: 2 %
Immature Granulocytes: 7 %
Lymphocytes Relative: 21 %
Lymphocytes Relative: 29 %
Lymphs Abs: 2.7 10*3/uL (ref 0.7–4.0)
Lymphs Abs: 5 10*3/uL — ABNORMAL HIGH (ref 0.7–4.0)
MCH: 26 pg (ref 26.0–34.0)
MCH: 26.5 pg (ref 26.0–34.0)
MCHC: 29.2 g/dL — ABNORMAL LOW (ref 30.0–36.0)
MCHC: 29.8 g/dL — ABNORMAL LOW (ref 30.0–36.0)
MCV: 87.4 fL (ref 80.0–100.0)
MCV: 90.8 fL (ref 80.0–100.0)
Monocytes Absolute: 0.5 10*3/uL (ref 0.1–1.0)
Monocytes Absolute: 0.7 10*3/uL (ref 0.1–1.0)
Monocytes Relative: 4 %
Monocytes Relative: 4 %
Neutro Abs: 10.1 10*3/uL — ABNORMAL HIGH (ref 1.7–7.7)
Neutro Abs: 9.3 10*3/uL — ABNORMAL HIGH (ref 1.7–7.7)
Neutrophils Relative %: 59 %
Neutrophils Relative %: 73 %
Platelets: 215 10*3/uL (ref 150–400)
Platelets: 263 10*3/uL (ref 150–400)
RBC: 2.94 MIL/uL — ABNORMAL LOW (ref 3.87–5.11)
RBC: 3.65 MIL/uL — ABNORMAL LOW (ref 3.87–5.11)
RDW: 15.2 % (ref 11.5–15.5)
RDW: 15.4 % (ref 11.5–15.5)
WBC: 12.9 10*3/uL — ABNORMAL HIGH (ref 4.0–10.5)
WBC: 17.1 10*3/uL — ABNORMAL HIGH (ref 4.0–10.5)
nRBC: 0 % (ref 0.0–0.2)
nRBC: 0.4 % — ABNORMAL HIGH (ref 0.0–0.2)

## 2022-02-07 LAB — CBC
HCT: 25.1 % — ABNORMAL LOW (ref 36.0–46.0)
Hemoglobin: 7.2 g/dL — ABNORMAL LOW (ref 12.0–15.0)
MCH: 26.6 pg (ref 26.0–34.0)
MCHC: 28.7 g/dL — ABNORMAL LOW (ref 30.0–36.0)
MCV: 92.6 fL (ref 80.0–100.0)
Platelets: 196 10*3/uL (ref 150–400)
RBC: 2.71 MIL/uL — ABNORMAL LOW (ref 3.87–5.11)
RDW: 15.5 % (ref 11.5–15.5)
WBC: 19.1 10*3/uL — ABNORMAL HIGH (ref 4.0–10.5)
nRBC: 0.5 % — ABNORMAL HIGH (ref 0.0–0.2)

## 2022-02-07 LAB — BLOOD GAS, ARTERIAL
Acid-base deficit: 19.5 mmol/L — ABNORMAL HIGH (ref 0.0–2.0)
Bicarbonate: 10.1 mmol/L — ABNORMAL LOW (ref 20.0–28.0)
O2 Saturation: 100 %
Patient temperature: 36.1
pCO2 arterial: 39 mmHg (ref 32–48)
pH, Arterial: 7.01 — CL (ref 7.35–7.45)
pO2, Arterial: 313 mmHg — ABNORMAL HIGH (ref 83–108)

## 2022-02-07 LAB — BASIC METABOLIC PANEL
Anion gap: >20 — ABNORMAL HIGH (ref 5–15)
BUN: 40 mg/dL — ABNORMAL HIGH (ref 8–23)
CO2: 9 mmol/L — ABNORMAL LOW (ref 22–32)
Calcium: 6.8 mg/dL — ABNORMAL LOW (ref 8.9–10.3)
Chloride: 106 mmol/L (ref 98–111)
Creatinine, Ser: 2.17 mg/dL — ABNORMAL HIGH (ref 0.44–1.00)
GFR, Estimated: 22 mL/min — ABNORMAL LOW (ref >60)
Glucose, Bld: 676 mg/dL (ref 70–99)
Potassium: 5.3 mmol/L — ABNORMAL HIGH (ref 3.5–5.1)
Sodium: 137 mmol/L (ref 135–145)

## 2022-02-07 LAB — COMPREHENSIVE METABOLIC PANEL
ALT: 8 U/L (ref 0–44)
ALT: 61 U/L — ABNORMAL HIGH (ref 0–44)
AST: 17 U/L (ref 15–41)
AST: 67 U/L — ABNORMAL HIGH (ref 15–41)
Albumin: 2.6 g/dL — ABNORMAL LOW (ref 3.5–5.0)
Albumin: 2.1 g/dL — ABNORMAL LOW (ref 3.5–5.0)
Alkaline Phosphatase: 44 U/L (ref 38–126)
Alkaline Phosphatase: 49 U/L (ref 38–126)
Anion gap: 13 (ref 5–15)
Anion gap: >20 — ABNORMAL HIGH (ref 5–15)
BUN: 38 mg/dL — ABNORMAL HIGH (ref 8–23)
BUN: 38 mg/dL — ABNORMAL HIGH (ref 8–23)
CO2: 15 mmol/L — ABNORMAL LOW (ref 22–32)
CO2: 7 mmol/L — ABNORMAL LOW (ref 22–32)
Calcium: 7.4 mg/dL — ABNORMAL LOW (ref 8.9–10.3)
Calcium: 7.4 mg/dL — ABNORMAL LOW (ref 8.9–10.3)
Chloride: 115 mmol/L — ABNORMAL HIGH (ref 98–111)
Chloride: 115 mmol/L — ABNORMAL HIGH (ref 98–111)
Creatinine, Ser: 1.78 mg/dL — ABNORMAL HIGH (ref 0.44–1.00)
Creatinine, Ser: 2.04 mg/dL — ABNORMAL HIGH (ref 0.44–1.00)
GFR, Estimated: 28 mL/min — ABNORMAL LOW (ref >60)
GFR, Estimated: 24 mL/min — ABNORMAL LOW (ref >60)
Glucose, Bld: 321 mg/dL — ABNORMAL HIGH (ref 70–99)
Glucose, Bld: 457 mg/dL — ABNORMAL HIGH (ref 70–99)
Potassium: 4.2 mmol/L (ref 3.5–5.1)
Potassium: 5.3 mmol/L — ABNORMAL HIGH (ref 3.5–5.1)
Sodium: 143 mmol/L (ref 135–145)
Sodium: 144 mmol/L (ref 135–145)
Total Bilirubin: 0.3 mg/dL (ref 0.3–1.2)
Total Bilirubin: 0.4 mg/dL (ref 0.3–1.2)
Total Protein: 5.6 g/dL — ABNORMAL LOW (ref 6.5–8.1)
Total Protein: 4.8 g/dL — ABNORMAL LOW (ref 6.5–8.1)

## 2022-02-07 LAB — HEMOGLOBIN A1C
Hgb A1c MFr Bld: 6.6 % — ABNORMAL HIGH (ref 4.8–5.6)
Mean Plasma Glucose: 142.72 mg/dL

## 2022-02-07 LAB — GLUCOSE, CAPILLARY: Glucose-Capillary: 537 mg/dL (ref 70–99)

## 2022-02-07 LAB — LACTIC ACID, PLASMA
Lactic Acid, Venous: 8.1 mmol/L (ref 0.5–1.9)
Lactic Acid, Venous: >9 mmol/L (ref 0.5–1.9)

## 2022-02-07 LAB — APTT: aPTT: 30 seconds (ref 24–36)

## 2022-02-07 LAB — PROTIME-INR
INR: 1.5 — ABNORMAL HIGH (ref 0.8–1.2)
Prothrombin Time: 17.8 seconds — ABNORMAL HIGH (ref 11.4–15.2)

## 2022-02-07 LAB — PREPARE RBC (CROSSMATCH)

## 2022-02-07 MED ORDER — LACTATED RINGERS IV SOLN: INTRAVENOUS | Status: DC

## 2022-02-07 MED ORDER — SODIUM BICARBONATE 8.4 % IV SOLN
INTRAVENOUS | Status: AC
  Filled 2022-02-07: qty 100

## 2022-02-07 MED ORDER — CHLORHEXIDINE GLUCONATE CLOTH 2 % EX PADS: 6.0000 | MEDICATED_PAD | Freq: Every day | CUTANEOUS | Status: DC

## 2022-02-07 MED ORDER — PROPOFOL 500 MG/50ML IV EMUL
INTRAVENOUS | Status: AC
  Filled 2022-02-07: qty 50

## 2022-02-07 MED ORDER — PANTOPRAZOLE SODIUM 40 MG IV SOLR
40.0000 mg | Freq: Two times a day (BID) | INTRAVENOUS | Status: DC
  Administered 2022-02-07: 40 mg via INTRAVENOUS
  Filled 2022-02-07: qty 10

## 2022-02-07 MED ORDER — NOREPINEPHRINE 16 MG/250ML-% IV SOLN
0.0000 ug/min | INTRAVENOUS | Status: DC
  Administered 2022-02-07: 75 ug/min via INTRAVENOUS
  Filled 2022-02-07 (×2): qty 250

## 2022-02-07 MED ORDER — SODIUM BICARBONATE 8.4 % IV SOLN
INTRAVENOUS | Status: AC | PRN
  Administered 2022-02-07: 50 mL via INTRAVENOUS

## 2022-02-07 MED ORDER — DOCUSATE SODIUM 50 MG/5ML PO LIQD: 100.0000 mg | Freq: Two times a day (BID) | ORAL | Status: DC

## 2022-02-07 MED ORDER — FENTANYL CITRATE (PF) 100 MCG/2ML IJ SOLN
INTRAMUSCULAR | Status: AC | PRN
  Administered 2022-02-07: 100 ug via INTRAVENOUS

## 2022-02-07 MED ORDER — SODIUM CHLORIDE 0.9 % IV SOLN
INTRAVENOUS | Status: DC
Start: 2022-02-07 — End: 2022-02-07

## 2022-02-07 MED ORDER — SODIUM CHLORIDE 0.9 % IV BOLUS
500.0000 mL | Freq: Once | INTRAVENOUS | Status: AC
  Administered 2022-02-07: 500 mL via INTRAVENOUS

## 2022-02-07 MED ORDER — MIDAZOLAM HCL (PF) 10 MG/2ML IJ SOLN: 2.0000 mg | INTRAMUSCULAR | Status: DC | PRN

## 2022-02-07 MED ORDER — EPINEPHRINE 1 MG/10ML IJ SOSY
PREFILLED_SYRINGE | INTRAMUSCULAR | Status: AC
  Filled 2022-02-07: qty 20

## 2022-02-07 MED ORDER — CALCIUM CHLORIDE 10 % IV SOLN
INTRAVENOUS | Status: AC
  Filled 2022-02-07: qty 20

## 2022-02-07 MED ORDER — INSULIN REGULAR(HUMAN) IN NACL 100-0.9 UT/100ML-% IV SOLN
INTRAVENOUS | Status: DC
  Filled 2022-02-07: qty 100

## 2022-02-07 MED ORDER — EPINEPHRINE 1 MG/10ML IJ SOSY
PREFILLED_SYRINGE | INTRAMUSCULAR | Status: AC
  Filled 2022-02-07: qty 40

## 2022-02-07 MED ORDER — SODIUM CHLORIDE 0.9% IV SOLUTION: Freq: Once | INTRAVENOUS | Status: DC

## 2022-02-07 MED ORDER — FENTANYL CITRATE PF 50 MCG/ML IJ SOSY
50.0000 ug | PREFILLED_SYRINGE | INTRAMUSCULAR | Status: DC | PRN
  Administered 2022-02-07 (×2): 50 ug via INTRAVENOUS
  Filled 2022-02-07: qty 1

## 2022-02-07 MED ORDER — FENTANYL 2500MCG IN NS 250ML (10MCG/ML) PREMIX INFUSION
25.0000 ug/h | INTRAVENOUS | Status: DC
  Administered 2022-02-07: 25 ug/h via INTRAVENOUS
  Filled 2022-02-07: qty 250

## 2022-02-07 MED ORDER — SODIUM BICARBONATE 8.4 % IV SOLN
INTRAVENOUS | Status: DC
  Filled 2022-02-07: qty 1000
  Filled 2022-02-07: qty 150

## 2022-02-07 MED ORDER — FENTANYL BOLUS VIA INFUSION
25.0000 ug | INTRAVENOUS | Status: DC | PRN
  Administered 2022-02-07 (×3): 50 ug via INTRAVENOUS

## 2022-02-07 MED ORDER — EPINEPHRINE 1 MG/10ML IJ SOSY
PREFILLED_SYRINGE | INTRAMUSCULAR | Status: AC | PRN
  Administered 2022-02-07 (×2): 1 mg via INTRAVENOUS

## 2022-02-07 MED ORDER — POLYETHYLENE GLYCOL 3350 17 G PO PACK: 17.0000 g | PACK | Freq: Every day | ORAL | Status: DC

## 2022-02-07 MED ORDER — GLYCOPYRROLATE 0.2 MG/ML IJ SOLN: 0.2000 mg | INTRAMUSCULAR | Status: DC | PRN

## 2022-02-07 MED ORDER — FENTANYL CITRATE PF 50 MCG/ML IJ SOSY
25.0000 ug | PREFILLED_SYRINGE | Freq: Once | INTRAMUSCULAR | Status: AC
  Administered 2022-02-07: 25 ug via INTRAVENOUS
  Filled 2022-02-07: qty 1

## 2022-02-07 MED ORDER — DEXTROSE IN LACTATED RINGERS 5 % IV SOLN: INTRAVENOUS | Status: DC

## 2022-02-07 MED ORDER — VANCOMYCIN HCL 1250 MG/250ML IV SOLN
1250.0000 mg | INTRAVENOUS | Status: DC
  Administered 2022-02-07: 1250 mg via INTRAVENOUS
  Filled 2022-02-07: qty 250

## 2022-02-07 MED ORDER — INSULIN ASPART 100 UNIT/ML IJ SOLN
0.0000 [IU] | INTRAMUSCULAR | Status: DC
  Administered 2022-02-07: 9 [IU] via SUBCUTANEOUS
  Filled 2022-02-07: qty 0.09

## 2022-02-07 MED ORDER — HYDROGEN PEROXIDE 3 % EX SOLN
CUTANEOUS | Status: AC
  Filled 2022-02-07: qty 473

## 2022-02-07 MED ORDER — ACETAMINOPHEN 325 MG PO TABS: 650.0000 mg | ORAL_TABLET | Freq: Four times a day (QID) | ORAL | Status: DC | PRN

## 2022-02-07 MED ORDER — FENTANYL CITRATE PF 50 MCG/ML IJ SOSY
PREFILLED_SYRINGE | INTRAMUSCULAR | Status: AC
  Filled 2022-02-07: qty 2

## 2022-02-07 MED ORDER — GLYCOPYRROLATE 1 MG PO TABS: 1.0000 mg | ORAL_TABLET | ORAL | Status: DC | PRN

## 2022-02-07 MED ORDER — PROPOFOL 1000 MG/100ML IV EMUL
5.0000 ug/kg/min | INTRAVENOUS | Status: DC
  Administered 2022-02-07: 15 ug/kg/min via INTRAVENOUS

## 2022-02-07 MED ORDER — DEXTROSE 50 % IV SOLN: 0.0000 mL | INTRAVENOUS | Status: DC | PRN

## 2022-02-07 MED ORDER — SODIUM CHLORIDE 0.9 % IV SOLN: 2.0000 g | INTRAVENOUS | Status: DC

## 2022-02-07 MED ORDER — PROPOFOL 1000 MG/100ML IV EMUL
INTRAVENOUS | Status: AC
  Filled 2022-02-07: qty 100

## 2022-02-07 MED ORDER — VASOPRESSIN 20 UNIT/ML IV SOLN
0.4000 [IU]/min | INTRAVENOUS | Status: DC
  Administered 2022-02-07 (×3): 0.4 [IU]/min via INTRAVENOUS
  Filled 2022-02-07 (×6): qty 2.5

## 2022-02-07 MED ORDER — POLYVINYL ALCOHOL 1.4 % OP SOLN
1.0000 [drp] | Freq: Four times a day (QID) | OPHTHALMIC | Status: DC | PRN
  Filled 2022-02-07: qty 15

## 2022-02-07 MED ORDER — ACETAMINOPHEN 650 MG RE SUPP: 650.0000 mg | Freq: Four times a day (QID) | RECTAL | Status: DC | PRN

## 2022-02-07 MED FILL — Medication: Qty: 1 | Status: AC

## 2022-02-11 LAB — TYPE AND SCREEN
ABO/RH(D): B POS
Antibody Screen: NEGATIVE
Unit division: 0
Unit division: 0
Unit division: 0
Unit division: 0

## 2022-02-11 LAB — BPAM RBC
Blood Product Expiration Date: 202307262359
Blood Product Expiration Date: 202307272359
Blood Product Expiration Date: 202307272359
Blood Product Expiration Date: 202307272359
Unit Type and Rh: 7300
Unit Type and Rh: 7300
Unit Type and Rh: 7300
Unit Type and Rh: 7300

## 2022-02-12 LAB — CULTURE, BLOOD (ROUTINE X 2)
Culture: NO GROWTH
Culture: NO GROWTH
Special Requests: ADEQUATE
Special Requests: ADEQUATE

## 2022-03-02 NOTE — Progress Notes (Signed)
Spiritual Care Note  Responded to call at [phone] for spiritual and emotional support for family as Ms Klug's situation was declining. Have provided pastoral presence, emotional and logistical support, and prayer with large extended family, including her children, siblings, nieces, and others.  Family has spoken with surgeon and decided against surgical intervention at this time. They describe Ms Tupou as "vibrant," "active," and someone who would not want her quality of life severely limited. They recall fondly that she "dresses for church, including a hat, every week."  Family is supporting each other, recalling what they love about Ms [place]ary, and spending time at bedside. Family values prayer and pastoral presence. They are aware of ongoing chaplain availability if circumstances change. Please page as needed. Thank you!   [place], MDiv, [facility] 24/7 pager [phone]

## 2022-03-02 NOTE — Progress Notes (Signed)
ANNUAL DIABETIC FOOT EXAM  Subjective: [name] [name] presents today for annual diabetic foot examination.  Patient confirms h/o diabetes.  Patient relates 20 year h/o diabetes.  Patient denies any h/o foot wounds.  Patient denies any numbness, tingling, burning, or pins/needle sensation in feet.  Patient's blood sugar was 111 mg/dl today. Last known  HgA1c was unknown.   Risk factors: diabetes, PAD, HTN, CAD, CKD, hyperlipidemia, current tobacco user.  Willey Blade, MD is patient's PCP. Last visit was Dec 22, 2021.  Past Medical History:  Diagnosis Date   Anginal pain ([place])    Arthritis    Asthma    Coronary artery disease    Diabetes mellitus    Dyspnea    Early cataracts, bilateral    GERD (gastroesophageal reflux disease)    occ   Hypertension    Lung cancer (HCC)    Mass of upper lobe of left lung    mediastinal adenopathy   Palpitations    Pneumonia    Stroke [facility])    mini stroke   Wears dentures    Wears glasses     Past Surgical History:  Procedure Laterality Date   ABDOMINAL HYSTERECTOMY     ANKLE SURGERY Left    fusion   COLONOSCOPY W/ BIOPSIES AND POLYPECTOMY     CORONARY ATHERECTOMY N/A 12/11/2021   Procedure: CORONARY ATHERECTOMY;  Surgeon: Leonie Man, MD;  Location: [place] [facility];  Service: Cardiovascular;  Laterality: N/A;   CORONARY STENT INTERVENTION N/A 12/09/2021   Procedure: CORONARY STENT INTERVENTION;  Surgeon: Wellington Hampshire, MD;  Location: [place] [facility];  Service: Cardiovascular;  Laterality: N/A;   CORONARY THROMBECTOMY N/A 12/11/2021   Procedure: Coronary Thrombectomy;  Surgeon: Leonie Man, MD;  Location: [place] [facility];  Service: Cardiovascular;  Laterality: N/A;   IR ANGIO EXTRACRAN SEL COM CAROTID INNOMINATE UNI BILAT MOD SED  11/30/2018   IR ANGIO VERTEBRAL SEL VERTEBRAL BILAT MOD SED  11/30/2018   IR ANGIOGRAM SELECTIVE EACH ADDITIONAL VESSEL  11/30/2018   MULTIPLE TOOTH EXTRACTIONS     RIGHT/LEFT  HEART CATH AND CORONARY ANGIOGRAPHY N/A 12/09/2021   Procedure: RIGHT/LEFT HEART CATH AND CORONARY ANGIOGRAPHY;  Surgeon: Dixie Dials, MD;  Location: [place] [facility];  Service: Cardiovascular;  Laterality: N/A;   TOTAL KNEE ARTHROPLASTY Right    VIDEO ASSISTED THORACOSCOPY (VATS)/ LOBECTOMY Left 04/21/2017   Procedure: VIDEO ASSISTED THORACOSCOPY (VATS)/LEFT UPPER LOBECTOMY;  Surgeon: Melrose Nakayama, MD;  Location: [place];  Service: Thoracic;  Laterality: Left;   VIDEO BRONCHOSCOPY WITH ENDOBRONCHIAL NAVIGATION N/A 01/21/2017   Procedure: VIDEO BRONCHOSCOPY WITH ENDOBRONCHIAL NAVIGATION;  Surgeon: Melrose Nakayama, MD;  Location: [place];  Service: Thoracic;  Laterality: N/A;   VIDEO BRONCHOSCOPY WITH ENDOBRONCHIAL ULTRASOUND N/A 01/21/2017   Procedure: VIDEO BRONCHOSCOPY WITH ENDOBRONCHIAL ULTRASOUND;  Surgeon: Melrose Nakayama, MD;  Location: [facility];  Service: Thoracic;  Laterality: N/A;   Current Outpatient Medications on File Prior to Visit  Medication Sig Dispense Refill   acetaminophen (TYLENOL) 500 MG tablet Take 1,000 mg by mouth every 6 (six) hours as needed for mild pain or headache.     aspirin EC 81 MG tablet Take 81 mg by mouth daily.     atorvastatin (LIPITOR) 40 MG tablet Take 1 tablet (40 mg total) by mouth daily at 6 PM. 30 tablet 3   budesonide-formoterol (SYMBICORT) 80-4.5 MCG/ACT inhaler Inhale 1 puff into the lungs 2 (two) times daily. 1 each 3  Cholecalciferol (VITAMIN D3) 125 MCG (5000 UT) TABS Take 5,000 Units by mouth daily.      clopidogrel (PLAVIX) 75 MG tablet Take 1 tablet (75 mg total) by mouth daily with breakfast. (Patient not taking: Reported on 02/09/2022) 90 tablet 1   diclofenac Sodium (VOLTAREN) 1 % GEL Apply 4 g topically 4 (four) times daily as needed. (Patient not taking: Reported on 02/19/2022) 500 g 6   metFORMIN (GLUCOPHAGE) 500 MG tablet Take 0.5 tablets (250 mg total) by mouth 2 (two) times daily with a meal. 90 tablet 1   metoprolol  succinate (TOPROL-XL) 25 MG 24 hr tablet Take 25 mg by mouth every evening.      nicotine (NICODERM CQ - DOSED IN MG/24 HOURS) 14 mg/24hr patch Place 14 mg onto the skin daily.     nitroGLYCERIN (NITROSTAT) 0.4 MG SL tablet Place 0.4 mg under the tongue every 5 (five) minutes as needed for chest pain.     omeprazole (PRILOSEC) 20 MG capsule Take 20 mg by mouth daily.     ondansetron (ZOFRAN-ODT) 8 MG disintegrating tablet Take 8 mg by mouth 2 (two) times daily as needed for nausea or vomiting.     torsemide (DEMADEX) 20 MG tablet Take 1 tablet (20 mg total) by mouth every Monday, Wednesday, and Friday. (Patient taking differently: Take 20 mg by mouth as directed. Take on Mondays & Thursdays) 30 tablet 1   No current facility-administered medications on file prior to visit.    Allergies  Allergen Reactions   Penicillins Rash and Other (See Comments)    PATIENT HAS HAD A PCN REACTION WITH IMMEDIATE RASH, FACIAL/TONGUE/THROAT SWELLING, SOB, OR LIGHTHEADEDNESS WITH HYPOTENSION:  #  #  #  YES  #  #  #  Has patient had a PCN reaction causing severe rash involving mucus membranes or skin necrosis: NO Has patient had a PCN reaction that required hospitalization NO Has patient had a PCN reaction occurring within the last 10 years: NO   Tramadol Itching   Codeine Rash   Social History   Occupational History   Occupation: retired    Comment: [employer].  Tobacco Use   Smoking status: Some Days    Packs/day: 0.25    Years: 12.00    Total pack years: 3.00    Types: Cigarettes    Last attempt to quit: 03/2017    Years since quitting: 4.9   Smokeless tobacco: Never  Vaping Use   Vaping Use: Never used  Substance and Sexual Activity   Alcohol use: No   Drug use: No   Sexual activity: Not Currently   Family History  Problem Relation Age of Onset   Diabetes Mother    Diabetes Sister    Hyperlipidemia Sister    Diabetes Brother    Diabetes Son    Cancer Neg Hx     Immunization History  Administered Date(s) Administered   PFIZER(Purple Top)SARS-COV-2 Vaccination 05/19/2020     Review of Systems: Negative except as noted in the HPI.   Objective: There were no vitals filed for this visit.  [name] [name] is a pleasant 87 y.o. female in NAD. AAO X 3.  Vascular Examination: CFT <4 seconds b/l LE. Faintly palpable DP pulses b/l LE. Faintly palpable PT pulse(s) b/l LE. Pedal hair absent. No pain with calf compression b/l. Lower extremity skin temperature gradient within normal limits. No edema noted b/l LE. No cyanosis or clubbing noted b/l LE.  Dermatological Examination: Pedal integument with  normal turgor, texture and tone b/l LE. No open wounds b/l. No interdigital macerations b/l. Toenails 2-5 bilaterally elongated, thickened, discolored with subungual debris. +Tenderness with dorsal palpation of nailplates. No hyperkeratotic or porokeratotic lesions present. Anonychia noted bilateral great toes. Nailbed(s) epithelialized.  Diffuse scaling noted peripherally and plantarly b/l feet.  No interdigital macerations.  No blisters, no weeping. No signs of secondary bacterial infection noted.  Neurological Examination: Protective sensation decreased with 10 gram monofilament b/l.  Musculoskeletal Examination: Muscle strength 5/5 to all lower extremity muscle groups bilaterally. No pain, crepitus or joint limitation noted with ROM bilateral LE. No gross bony deformities bilaterally.  Footwear Assessment: Does the patient wear appropriate shoes? Yes. Does the patient need inserts/orthotics? No.     Latest Ref Rng & Units Mar 09, 2022    5:10 AM 08/30/2021    4:46 PM  Hemoglobin A1C  Hemoglobin-A1c 4.8 - 5.6 % 6.6  6.1    Assessment: 1. Pain due to onychomycosis of toenails of both feet   2. Tinea pedis of both feet   3. Type II diabetes mellitus with peripheral circulatory disorder (HCC)   4. Encounter for diabetic foot exam ([place])     ADA Risk  Categorization:  High Risk  Patient has one or more of the following: Loss of protective sensation Absent pedal pulses Severe Foot deformity History of foot ulcer  Plan: -Patient was evaluated and treated. All patient's and/or POA's questions/concerns answered on today's visit. -Diabetic foot examination performed today. -Continue foot and shoe inspections daily. Monitor blood glucose per PCP/Endocrinologist's recommendations. -Continue diabetic shoes daily. -Mycotic toenails 2-5 bilaterally were debrided in length and girth with sterile nail nippers and dremel without iatrogenic bleeding. -For tinea pedis, Rx sent to pharmacy for Ketoconazole Cream 2% to be applied once daily for six weeks. -Patient/POA to call should there be question/concern in the interim. Return in about 3 months (around 05/01/2022).  Marzetta Board, DPM

## 2022-03-02 NOTE — Progress Notes (Signed)
Cross covering ICU physician.   Upon arrival to ED to evaluate pt RN at bedside was actively calling code blue. Immediately we began cpr, bagging pt, increased infusing levo and called for crash cart. Epi was given as soon as it arrived per ACLS protocol. Pt was intubated at pulse check.   ROSC was achieved (see time sheet for complete details). She was purposefully moving but not opening eyes or following commands. She is on escalating pressors at this time norepi at 176mcg, vaso ordered coming from pharmacy. We have pushed additional ml of code dose epi along with bicarb.   Stat labs will be sent [facility] reported they would be notifying surgery immediately of her worsening and now this event.   Update 0220:  Was able to reach [name] [name] (pt's daughter) and [name] [name] (pt's son). Other siblings have been called but did not answer.

## 2022-03-02 NOTE — Progress Notes (Signed)
Chaplain received call at [phone] with notification that Ms Sankey had been moved to comfort care and large extended family had been surrounding her throughout the night. Family would appreciate additional prayer.  Chaplain presented to pt room and notified by pt's nurse that Achsah had died. Pt surrounded by her children and extended family. Chaplain engaged family in life review and asked open ended questions to facilitate story telling and emotional expression. Family shared that [place] or Reiley Bertagnolli gave birth to 6 children and raised three more, "she raised everyone's kids." They shared about Drenda's very abrupt decline after having breakfast and going shopping with her daughter yesterday morning. Pt's daughter, is appropriately in distress. Chaplain provided supportive presence to large family including prayer in their religious tradition. Chaplain remained nearby until family notified the RN of funeral home arrangements and decided they were ready to leave.  Thank you for the consult.  Please page as further needs arise.  Donald Prose. Elyn Peers, M.Div. [facility] Chaplain [facility] 24/7 pager [phone]

## 2022-03-02 NOTE — Sepsis Progress Note (Signed)
Sepsis protocol monitored by eLink 

## 2022-03-02 NOTE — Progress Notes (Signed)
NAME:  [name] [name], MRN:  [id number], DOB:  [birth year], LOS: 1 ADMISSION DATE:  02/04/2022, CONSULTATION DATE:  03-04-22 REFERRING MD:  [facility], CHIEF COMPLAINT:  Hypotension   History of Present Illness:  87 yo female with pmh of cad, t2dm, htn, asthma and h/o cva presented to ed with acute on set abdominal pain around her umbilicus earlier today. All history is obtained from chart as pt was actively coding when I arrived to eval and now she is intubated and sedated. Reportedly she went to breakfast with her family this am and had usual meal. 2 hours later she had diarrhea. No other family members report being ill. No fever/chills/ no vomiting. Ed evaluation found a sbo with transition point and surgery was called recommending medical management with ng at this time 2/2  antiplt medications. Pt was hemodynamically stable per [facility] who was asked to admit pt.   Later in the evening pt bp began to fall but was fluid responsive. Lactate elevated. Her bp cont to fall but she was mentating per reports. Ultimately she required vasopressors to start and ccm was thus consulted.   Upon arrival to the ed for evaluation pt BP was systolic in 92'E on levo infusion. She was unresponsive and RN was calling code. CPR was started and ACLS . ROSC was achieved (see code documentation for complete details) pt is requiring 2 vasopressors norepi at 100 and vaso at 0.04.     Pertinent  Medical History  As above  Significant Hospital Events: Including procedures, antibiotic start and stop dates in addition to other pertinent events   7/8: admitted for sbo with transition point  7/9: code blue, intubated, cvc, arterial line  Interim History / Subjective:   This morning worsening shock. Pain not well controlled. Multiple family members at bedside and in conference room.   Objective   Blood pressure (!) 113/39, pulse 88, temperature (!) 97.2 F (36.2 C), resp. rate (!) 27, height 5\' 9"  (1.753 m), weight 72.6 kg, SpO2  (!) 64 %.    Vent Mode: PRVC FiO2 (%):  [60 %-100 %] 60 % Set Rate:  [20 bmp] 20 bmp Vt Set:  [520 mL] 520 mL PEEP:  [5 cmH20] 5 cmH20 Plateau Pressure:  [8 QAS34-19 cmH20] 20 cmH20   Intake/Output Summary (Last 24 hours) at 03-04-2022 0842 Last data filed at 03-04-2022 6222 Gross per 24 hour  Intake 4850.14 ml  Output 260 ml  Net 4590.14 ml   Filed Weights   2022/03/04 0544  Weight: 72.6 kg    Examination: Gen:      Intubated, sedated, acutely ill appearing HEENT:  ETT to vent Lungs:    sounds of mechanical ventilation auscultated no wheezes CV:         tachycardic regular Abd:     Tense, distended, diffusely ttp with hypoactive bowel sounds. Frankly bloody OG output Ext:    No edema Skin:      Warm and dry; no rashes Neuro:   sedated, RASS 0, moves all 4 extremities   Labs reviewed Anemia Lactic acidosis Aki Hyperkalemia Hyperglycemia    Resolved Hospital Problem list     Assessment & Plan:  In hospital cardiac arrest with ROSC  Acute hypoxemic respiratory failure Septic Shock secondary to Bowel obstruction with concern for perforation Acute blood loss anemia AKI with anion gap metabolic acidosis Lactic acidosis Hyperglycemia CAD HTN  Patient has experienced catastrophic multi-organ dysfunction from acute small bowel obstruction. Concern for bowel perforation given her  worsening pain, shock, anemia. Appreciate surgery involvement - not an option for her given high likelihood for operative mortality. Will continue abx and vasopressor support. Will initiate continuous pain medications. Plan to maintain current level of life support without escalation until family can be here. Then will plan for palliative extubation.   Best Practice (right click and "Reselect all SmartList Selections" daily)   Diet/type: NPO DVT prophylaxis: SCD GI prophylaxis: PPI Lines: Central line Foley:  Yes, and it is still needed Code Status:  full code Last date of multidisciplinary  goals of care discussion see IPAL documentation from same day.  The patient is critically ill due to shock, respiratory failure, cardiac arrest.  Critical care was necessary to treat or prevent imminent or life-threatening deterioration.  Critical care was time spent personally by me on the following activities: development of treatment plan with patient and/or surrogate as well as nursing, discussions with consultants, evaluation of patient's response to treatment, examination of patient, obtaining history from patient or surrogate, ordering and performing treatments and interventions, ordering and review of laboratory studies, ordering and review of radiographic studies, pulse oximetry, re-evaluation of patient's condition and participation in multidisciplinary rounds.   Critical Care Time devoted to patient care services described in this note is 80 minutes. This time reflects time of care of this [place] . This critical care time does not reflect separately billable procedures or procedure time, teaching time or supervisory time of PA/NP/Med student/Med Resident etc but could involve care discussion time.       Spero Geralds [facility] 02/08/2022 8:49 AM  Pager: see AMION  If no response to pager , please call critical care on call (see AMION) until 7pm After 7:00 pm call Elink

## 2022-03-02 NOTE — Consult Note (Signed)
NAME:  [name] [name], MRN:  [id number], DOB:  10/12/1934, LOS: 1 ADMISSION DATE:  02/08/2022, CONSULTATION DATE:  February 20, 2022 REFERRING MD:  TRH, CHIEF COMPLAINT:  Hypotension   History of Present Illness:  87 yo female with pmh of cad, t2dm, htn, asthma and h/o cva presented to ed with acute on set abdominal pain around her umbilicus earlier today. All history is obtained from chart as pt was actively coding when I arrived to eval and now she is intubated and sedated. Reportedly she went to breakfast with her family this am and had usual meal. 2 hours later she had diarrhea. No other family members report being ill. No fever/chills/ no vomiting. Ed evaluation found a sbo with transition point and surgery was called recommending medical management with ng at this time 2/2  antiplt medications. Pt was hemodynamically stable per TRH who was asked to admit pt.   Later in the evening pt bp began to fall but was fluid responsive. Lactate elevated. Her bp cont to fall but she was mentating per reports. Ultimately she required vasopressors to start and ccm was thus consulted.   Upon arrival to the ed for evaluation pt BP was systolic in 82'N on levo infusion. She was unresponsive and RN was calling code. CPR was started and ACLS . ROSC was achieved (see code documentation for complete details) pt is requiring 2 vasopressors norepi at 100 and vaso at 0.04.     Pertinent  Medical History  As above  Significant Hospital Events: Including procedures, antibiotic start and stop dates in addition to other pertinent events   7/8: admitted for sbo with transition point  7/9: code blue, intubated, cvc, arterial line  Interim History / Subjective:    Objective   Blood pressure (!) 74/54, pulse 95, temperature (!) 94.9 F (34.9 C), temperature source Rectal, resp. rate 15, SpO2 100 %.       No intake or output data in the 24 hours ending 02-20-22 0027 There were no vitals filed for this  visit.  Examination: General: unresponsive female laying motionless in bed HENT: hair piece in place. Pupils small and unreactive, mmmp Lungs: after intubation clear Cardiovascular: irreg Abdomen: distended and somewhat firm. Unable to elicit grimace bs absent Extremities: no c/c/e Neuro: unresponsive GU: deferred  Resolved Hospital Problem list     Assessment & Plan:  In hospital cardiac arrest with ROSC:  -10-15 min down time -now with shock on vasopressors Shock 2/2 suspected sepsis and above:  -echo -cont pressor support to map >65 -cont abx -hold home anti-htn meds.  Sepsis 2/2 sbo:  -cont ng and npo status -f/u cx -anaerobic coverage as well as gn. No perforation noted on ct -await surgery reassessment  Aki with metabolic acidosis:  -given numerous amps during arrest -now on gtt with pH of 7, bicarb 7 -follow uop and indices Transaminitis:  -2/2/ shock Normocytic anemia:  -noted hemoglobin drop with increased wbc -in light of shock will transfuse 2 units with hgb drop from 12.9->7.8 Lactic acidosis:  -cont with resuscitation  -trend  H/o cad -holding plavix at this time.   T2dm:  -ssi -hold metformin from home.   H/o htn:  -holding the mediscations.   Best Practice (right click and "Reselect all SmartList Selections" daily)   Diet/type: NPO DVT prophylaxis: SCD GI prophylaxis: PPI Lines: Central line Foley:  Yes, and it is still needed Code Status:  full code Last date of multidisciplinary goals of care discussion [briefly with family  yvonne but also required updating on the current situation. The family is en route and this should be continued when they arrive]  Labs   CBC: Recent Labs  Lab 02/09/2022 1657 02/02/2022 1705 02/16/2022 2052  WBC 8.4  --  9.2  NEUTROABS 6.3  --   --   HGB 12.9 14.6 11.8*  HCT 41.4 43.0 39.0  MCV 82.5  --  85.7  PLT 363  --  425    Basic Metabolic Panel: Recent Labs  Lab 02/14/2022 1657 02/15/2022 1705  02/17/2022 2052  NA 141 143  --   K 4.1 4.2  --   CL 111 112*  --   CO2 19*  --   --   GLUCOSE 187* 191*  --   BUN 35* 33*  --   CREATININE 1.34* 1.40* 1.46*  CALCIUM 9.2  --   --    GFR: CrCl cannot be calculated (Unknown ideal weight.). Recent Labs  Lab 02/21/2022 1657 01/30/2022 2052  WBC 8.4 9.2    Liver Function Tests: Recent Labs  Lab 02/05/2022 1657  AST 13*  ALT 9  ALKPHOS 60  BILITOT 0.6  PROT 8.3*  ALBUMIN 3.9   Recent Labs  Lab 02/25/2022 1657  LIPASE 32   No results for input(s): "AMMONIA" in the last 168 hours.  ABG    Component Value Date/Time   PHART 7.443 12/09/2021 1219   PCO2ART 42.1 12/09/2021 1219   PO2ART 73 (L) 12/09/2021 1219   HCO3 28.8 (H) 12/09/2021 1219   TCO2 19 (L) 02/27/2022 1705   ACIDBASEDEF 5.0 (H) 04/22/2017 0248   O2SAT 95 12/09/2021 1219     Coagulation Profile: No results for input(s): "INR", "PROTIME" in the last 168 hours.  Cardiac Enzymes: No results for input(s): "CKTOTAL", "CKMB", "CKMBINDEX", "TROPONINI" in the last 168 hours.  HbA1C: Hgb A1c MFr Bld  Date/Time Value Ref Range Status  08/30/2021 04:46 PM 6.1 (H) 4.8 - 5.6 % Final    Comment:    (NOTE) Pre diabetes:          5.7%-6.4%  Diabetes:              >6.4%  Glycemic control for   <7.0% adults with diabetes   04/19/2017 11:43 AM 6.1 (H) 4.8 - 5.6 % Final    Comment:    (NOTE) Pre diabetes:          5.7%-6.4% Diabetes:              >6.4% Glycemic control for   <7.0% adults with diabetes     CBG: No results for input(s): "GLUCAP" in the last 168 hours.  Review of Systems:   As per hpi  Past Medical History:  She,  has a past medical history of Anginal pain ([place]), Arthritis, Asthma, Coronary artery disease, Diabetes mellitus, Dyspnea, Early cataracts, bilateral, GERD (gastroesophageal reflux disease), Hypertension, Lung cancer ([place]), Mass of upper lobe of left lung, Palpitations, Pneumonia, Stroke ([place]), Wears dentures, and Wears glasses.    Surgical History:   Past Surgical History:  Procedure Laterality Date   ABDOMINAL HYSTERECTOMY     ANKLE SURGERY Left    fusion   COLONOSCOPY W/ BIOPSIES AND POLYPECTOMY     CORONARY ATHERECTOMY N/A 12/11/2021   Procedure: CORONARY ATHERECTOMY;  Surgeon: Leonie Man, MD;  Location: [facility];  Service: Cardiovascular;  Laterality: N/A;   CORONARY STENT INTERVENTION N/A 12/09/2021   Procedure: CORONARY STENT INTERVENTION;  Surgeon: Wellington Hampshire, MD;  Location: [facility];  Service: Cardiovascular;  Laterality: N/A;   CORONARY THROMBECTOMY N/A 12/11/2021   Procedure: Coronary Thrombectomy;  Surgeon: Leonie Man, MD;  Location: [facility];  Service: Cardiovascular;  Laterality: N/A;   IR ANGIO EXTRACRAN SEL COM CAROTID INNOMINATE UNI BILAT MOD SED  11/30/2018   IR ANGIO VERTEBRAL SEL VERTEBRAL BILAT MOD SED  11/30/2018   IR ANGIOGRAM SELECTIVE EACH ADDITIONAL VESSEL  11/30/2018   MULTIPLE TOOTH EXTRACTIONS     RIGHT/LEFT HEART CATH AND CORONARY ANGIOGRAPHY N/A 12/09/2021   Procedure: RIGHT/LEFT HEART CATH AND CORONARY ANGIOGRAPHY;  Surgeon: Dixie Dials, MD;  Location: [facility];  Service: Cardiovascular;  Laterality: N/A;   TOTAL KNEE ARTHROPLASTY Right    VIDEO ASSISTED THORACOSCOPY (VATS)/ LOBECTOMY Left 04/21/2017   Procedure: VIDEO ASSISTED THORACOSCOPY (VATS)/LEFT UPPER LOBECTOMY;  Surgeon: Melrose Nakayama, MD;  Location: [facility];  Service: Thoracic;  Laterality: Left;   VIDEO BRONCHOSCOPY WITH ENDOBRONCHIAL NAVIGATION N/A 01/21/2017   Procedure: VIDEO BRONCHOSCOPY WITH ENDOBRONCHIAL NAVIGATION;  Surgeon: Melrose Nakayama, MD;  Location: [facility];  Service: Thoracic;  Laterality: N/A;   VIDEO BRONCHOSCOPY WITH ENDOBRONCHIAL ULTRASOUND N/A 01/21/2017   Procedure: VIDEO BRONCHOSCOPY WITH ENDOBRONCHIAL ULTRASOUND;  Surgeon: Melrose Nakayama, MD;  Location: [facility];  Service: Thoracic;  Laterality: N/A;     Social History:   reports  that she has been smoking cigarettes. She has a 3.00 pack-year smoking history. She has never used smokeless tobacco. She reports that she does not drink alcohol and does not use drugs.   Family History:  Her family history includes Diabetes in her brother, mother, sister, and son; Hyperlipidemia in her sister. There is no history of Cancer.   Allergies Allergies  Allergen Reactions   Penicillins Rash and Other (See Comments)    PATIENT HAS HAD A PCN REACTION WITH IMMEDIATE RASH, FACIAL/TONGUE/THROAT SWELLING, SOB, OR LIGHTHEADEDNESS WITH HYPOTENSION:  #  #  #  YES  #  #  #  Has patient had a PCN reaction causing severe rash involving mucus membranes or skin necrosis: NO Has patient had a PCN reaction that required hospitalization NO Has patient had a PCN reaction occurring within the last 10 years: NO   Tramadol Itching   Codeine Rash     Home Medications  Prior to Admission medications   Medication Sig Start Date End Date Taking? Authorizing Provider  acetaminophen (TYLENOL) 500 MG tablet Take 1,000 mg by mouth every 6 (six) hours as needed for mild pain or headache.   Yes [provider]  aspirin EC 81 MG tablet Take 81 mg by mouth daily.   Yes [provider]  atorvastatin (LIPITOR) 40 MG tablet Take 1 tablet (40 mg total) by mouth daily at 6 PM. 12/13/21  Yes Dixie Dials, MD  budesonide-formoterol (SYMBICORT) 80-4.5 MCG/ACT inhaler Inhale 1 puff into the lungs 2 (two) times daily. 12/13/21  Yes Dixie Dials, MD  Cholecalciferol (VITAMIN D3) 125 MCG (5000 UT) TABS Take 5,000 Units by mouth daily.    Yes [provider]  metFORMIN (GLUCOPHAGE) 500 MG tablet Take 0.5 tablets (250 mg total) by mouth 2 (two) times daily with a meal. 12/13/21  Yes Dixie Dials, MD  metoprolol succinate (TOPROL-XL) 25 MG 24 hr tablet Take 25 mg by mouth every evening.    Yes [provider]  nicotine (NICODERM CQ - DOSED IN MG/24 HOURS) 14 mg/24hr patch Place 14 mg onto  the skin daily.  Yes [provider]  nitroGLYCERIN (NITROSTAT) 0.4 MG SL tablet Place 0.4 mg under the tongue every 5 (five) minutes as needed for chest pain.   Yes [provider]  omeprazole (PRILOSEC) 20 MG capsule Take 20 mg by mouth daily. 11/02/18  Yes [provider]  ondansetron (ZOFRAN-ODT) 8 MG disintegrating tablet Take 8 mg by mouth 2 (two) times daily as needed for nausea or vomiting.   Yes [provider]  torsemide (DEMADEX) 20 MG tablet Take 1 tablet (20 mg total) by mouth every Monday, Wednesday, and Friday. Patient taking differently: Take 20 mg by mouth as directed. Take on Mondays & Thursdays 12/14/21  Yes Dixie Dials, MD  clopidogrel (PLAVIX) 75 MG tablet Take 1 tablet (75 mg total) by mouth daily with breakfast. Patient not taking: Reported on 02/09/2022 12/13/21   Dixie Dials, MD  diclofenac Sodium (VOLTAREN) 1 % GEL Apply 4 g topically 4 (four) times daily as needed. Patient not taking: Reported on 02/16/2022 01/13/21   Hilts, Legrand Como, MD  ketoconazole (NIZORAL) 2 % cream Apply to both feet and between toes once daily for 6 weeks. Patient not taking: Reported on 01/30/2022 01/29/22   Marzetta Board, DPM     Critical care time: 59 min from 0024-0300 excluding procedures

## 2022-03-02 NOTE — ED Provider Notes (Signed)
Department of Emergency Medicine CODE BLUE CONSULTATION    Code Blue CONSULT NOTE  Chief Complaint: Cardiac arrest/unresponsive   Level V Caveat: Unresponsive  History of present illness: I was contacted by the hospital for a CODE BLUE cardiac arrest while awaiting a bed in the ED and presented to the patient's bedside.   Apparently patient had been boarding in the ED secondary to SBO, decreasing BP's had just started levophed. Shortly after NGT placement patient had significantly worsening blood pressure and became unresponsive.  Initially had a pulse.  Critical care came to bedside from previous consultation for admission.  I was asked to come to bedside as well secondary to unresponsiveness.  On my arrival patient's recorded blood pressure was 59/42 but did not have a pleth on her pulse ox but still had organized rhythm monitor telemetry.  Abdomen was distended and she had a GCS of 3.  Was not able to palpate a carotid pulse and thus I started chest compressions.  Epinephrine/bicarb given.  Multiple rounds of compressions with epi and bicarb and patient had ROSC.  Levophed was increased.  Patient had purposeful movements after that.  Patient was started on propofol for sedation.  Patient was intubated by critical care and central line placed by them as well.   ROS: Unable to obtain, Level V caveat  Scheduled Meds:  atorvastatin  40 mg Oral q1800   cholecalciferol  5,000 Units Oral Daily   enoxaparin (LOVENOX) injection  40 mg Subcutaneous Q24H   fentaNYL       fentaNYL (SUBLIMAZE) injection  50 mcg Intravenous Once   hydrogen peroxide       metoprolol succinate  25 mg Oral QPM   midodrine  10 mg Oral TID WC   mometasone-formoterol  2 puff Inhalation BID   sodium bicarbonate       [START ON 02/08/2022] torsemide  20 mg Oral Once per day on Mon Thu   Continuous Infusions:  sodium chloride 150 mL/hr at 02/03/2022 2146   sodium chloride Stopped (March 04, 2022 0052)   metronidazole      norepinephrine (LEVOPHED) Adult infusion 6 mcg/min (2022-03-04 0059)   propofol     vancomycin     vasopressin (PITRESSIN) 50 Units in dextrose 5 % 250 mL (0.2 Units/mL) infusion     PRN Meds:.acetaminophen **OR** acetaminophen, fentaNYL, hydrogen peroxide, magnesium hydroxide, nitroGLYCERIN, ondansetron **OR** ondansetron (ZOFRAN) IV, propofol, sodium bicarbonate, traZODone Past Medical History:  Diagnosis Date   Anginal pain ([place])    Arthritis    Asthma    Coronary artery disease    Diabetes mellitus    Dyspnea    Early cataracts, bilateral    GERD (gastroesophageal reflux disease)    occ   Hypertension    Lung cancer ([facility])    Mass of upper lobe of left lung    mediastinal adenopathy   Palpitations    Pneumonia    Stroke ([place])    mini stroke   Wears dentures    Wears glasses    Past Surgical History:  Procedure Laterality Date   ABDOMINAL HYSTERECTOMY     ANKLE SURGERY Left    fusion   COLONOSCOPY W/ BIOPSIES AND POLYPECTOMY     CORONARY ATHERECTOMY N/A 12/11/2021   Procedure: CORONARY ATHERECTOMY;  Surgeon: Leonie Man, MD;  Location: [facility];  Service: Cardiovascular;  Laterality: N/A;   CORONARY STENT INTERVENTION N/A 12/09/2021   Procedure: CORONARY STENT INTERVENTION;  Surgeon: Wellington Hampshire, MD;  Location: [facility];  Service: Cardiovascular;  Laterality: N/A;   CORONARY THROMBECTOMY N/A 12/11/2021   Procedure: Coronary Thrombectomy;  Surgeon: Leonie Man, MD;  Location: [facility];  Service: Cardiovascular;  Laterality: N/A;   IR ANGIO EXTRACRAN SEL COM CAROTID INNOMINATE UNI BILAT MOD SED  11/30/2018   IR ANGIO VERTEBRAL SEL VERTEBRAL BILAT MOD SED  11/30/2018   IR ANGIOGRAM SELECTIVE EACH ADDITIONAL VESSEL  11/30/2018   MULTIPLE TOOTH EXTRACTIONS     RIGHT/LEFT HEART CATH AND CORONARY ANGIOGRAPHY N/A 12/09/2021   Procedure: RIGHT/LEFT HEART CATH AND CORONARY ANGIOGRAPHY;  Surgeon: Dixie Dials, MD;  Location: [facility];  Service: Cardiovascular;  Laterality: N/A;   TOTAL KNEE ARTHROPLASTY Right    VIDEO ASSISTED THORACOSCOPY (VATS)/ LOBECTOMY Left 04/21/2017   Procedure: VIDEO ASSISTED THORACOSCOPY (VATS)/LEFT UPPER LOBECTOMY;  Surgeon: Melrose Nakayama, MD;  Location: [facility];  Service: Thoracic;  Laterality: Left;   VIDEO BRONCHOSCOPY WITH ENDOBRONCHIAL NAVIGATION N/A 01/21/2017   Procedure: VIDEO BRONCHOSCOPY WITH ENDOBRONCHIAL NAVIGATION;  Surgeon: Melrose Nakayama, MD;  Location: [facility];  Service: Thoracic;  Laterality: N/A;   VIDEO BRONCHOSCOPY WITH ENDOBRONCHIAL ULTRASOUND N/A 01/21/2017   Procedure: VIDEO BRONCHOSCOPY WITH ENDOBRONCHIAL ULTRASOUND;  Surgeon: Melrose Nakayama, MD;  Location: [facility];  Service: Thoracic;  Laterality: N/A;   Social History   Socioeconomic History   Marital status: Widowed    Spouse name: Not on file   Number of children: 6   Years of education: Not on file   Highest education level: High school graduate  Occupational History   Occupation: retired    Comment: [employer].  Tobacco Use   Smoking status: Some Days    Packs/day: 0.25    Years: 12.00    Total pack years: 3.00    Types: Cigarettes    Last attempt to quit: 03/2017    Years since quitting: 4.9   Smokeless tobacco: Never  Vaping Use   Vaping Use: Never used  Substance and Sexual Activity   Alcohol use: No   Drug use: No   Sexual activity: Not Currently  Other Topics Concern   Not on file  Social History Narrative   Right handed   Social Determinants of Health   Financial Resource Strain: Not on file  Food Insecurity: No Food Insecurity (12/09/2021)   Hunger Vital Sign    Worried About Running Out of Food in the Last Year: Never true    Ran Out of Food in the Last Year: Never true  Transportation Needs: No Transportation Needs (12/09/2021)   PRAPARE - Hydrologist (Medical): No    Lack of Transportation (Non-Medical): No  Physical  Activity: Not on file  Stress: Not on file  Social Connections: Not on file  Intimate Partner Violence: Not on file   Allergies  Allergen Reactions   Penicillins Rash and Other (See Comments)    PATIENT HAS HAD A PCN REACTION WITH IMMEDIATE RASH, FACIAL/TONGUE/THROAT SWELLING, SOB, OR LIGHTHEADEDNESS WITH HYPOTENSION:  #  #  #  YES  #  #  #  Has patient had a PCN reaction causing severe rash involving mucus membranes or skin necrosis: NO Has patient had a PCN reaction that required hospitalization NO Has patient had a PCN reaction occurring within the last 10 years: NO   Tramadol Itching   Codeine Rash    Last set of Vital Signs (not current) Vitals:   2022-02-08 0100  02-12-22 0129  BP: 97/82   Pulse: 65 (!) 139  Resp: (!) 22   Temp:    SpO2: (!) 88%       Physical Exam  Gen: unresponsive Cardiovascular: pulseless  Resp: apneic. Breath sounds equal bilaterally with bagging  Abd: nondistended  Neuro: GCS 3, unresponsive to pain  HEENT: No blood in posterior pharynx, gag reflex absent  Neck: No crepitus  Musculoskeletal: No deformity  Skin: warm  Procedures   CRITICAL CARE Performed by: Merrily Pew Total critical care time: 35 Critical care time was exclusive of separately billable procedures and treating other patients. Critical care was necessary to treat or prevent imminent or life-threatening deterioration. Critical care was time spent personally by me on the following activities: development of treatment plan with patient and/or surrogate as well as nursing, discussions with consultants, evaluation of patient's response to treatment, examination of patient, obtaining history from patient or surrogate, ordering and performing treatments and interventions, ordering and review of laboratory studies, ordering and review of radiographic studies, pulse oximetry and re-evaluation of patient's condition.  Cardiopulmonary Resuscitation (CPR) Procedure  Note  Directed/Performed by: Merrily Pew I personally directed ancillary staff and/or performed CPR in an effort to regain return of spontaneous circulation and to maintain cardiac, neuro and systemic perfusion.   Assessment and Plan  Unclear etiology of symptoms.  Could have been a vagal type event however had significant decrease in blood pressures prior to this.  Did not receive any medications prior to it that would make you think it was overdose. Bed status updated to ICU, Dr. Ruthann Cancer to resume care.     Shaynah Hund, Corene Cornea, MD Feb 12, 2022 [phone]

## 2022-03-02 NOTE — Progress Notes (Signed)
Pharmacy Antibiotic Note  [name] [name] is a 87 y.o. female admitted on 02/18/2022 with septic shock due to likely GI source.  Patient came to ED with c/o abdominal pain.  CT showed small bowel obstruction.  Pharmacy has been consulted for Vancomycin and Cefepime dosing.  While in ED patient coded and was intubated.  Plan: Cefepime 2gm IV q24h Vancomycin 1250 mg IV Q 48 hrs. Goal AUC 400-550.  Expected AUC: 538.4  SCr used: 2.04 Metronidazole per MD Closely follow SCr F/u culture results and sensitivities  Height: 5\' 9"  (175.3 cm) Weight: 72.6 kg (160 lb 0.9 oz) IBW/kg (Calculated) : 66.2  Temp (24hrs), Avg:94.9 F (34.9 C), Min:94.1 F (34.5 C), Max:96.8 F (36 C)  Recent Labs  Lab 02/27/2022 1657 02/13/2022 1705 02/19/2022 2052 01/30/2022 2356 2022/02/19 0022 02/19/22 0240 02/19/2022 0510  WBC 8.4  --  9.2 12.9*  --  17.1* 19.1*  CREATININE 1.34* 1.40* 1.46* 1.78*  --  2.04*  --   LATICACIDVEN  --   --   --   --  8.1*  --   --     Estimated Creatinine Clearance: 21.5 mL/min (A) (by C-G formula based on SCr of 2.04 mg/dL (H)).    Allergies  Allergen Reactions   Penicillins Rash and Other (See Comments)    PATIENT HAS HAD A PCN REACTION WITH IMMEDIATE RASH, FACIAL/TONGUE/THROAT SWELLING, SOB, OR LIGHTHEADEDNESS WITH HYPOTENSION:  #  #  #  YES  #  #  #  Has patient had a PCN reaction causing severe rash involving mucus membranes or skin necrosis: NO Has patient had a PCN reaction that required hospitalization NO Has patient had a PCN reaction occurring within the last 10 years: NO   Tramadol Itching   Codeine Rash    Antimicrobials this admission: 7/9 Cefepime >>   7/9 Metronidazole >>   7/9 Vancomycin >>  Dose adjustments this admission:    Microbiology results: 7/9 BCx:      Thank you for allowing pharmacy to be a part of this patient's care.  Everette Rank, PharmD 02/19/22 6:03 AM

## 2022-03-02 NOTE — Progress Notes (Signed)
Critical care note:  Date of note: 02-20-22  Subjective: Patient has remained hypotensive despite boluses of IV normal saline and midodrine.  She became lethargic later on and then unresponsive with no airway protection.  Objective: Physical examination: Generally: Acutely ill elderly African-American female was initially lethargic and unresponsive in moderate respiratory distress. Vital signs: BP was 68/50 with a heart rate of 54 and respiratory to 31 and pulse oximetry was 78% on room air. Head - atraumatic, normocephalic.  Pupils - equal, round and reactive to light and accommodation. Extraocular movements are intact. No scleral icterus.  Oropharynx - moist mucous membranes and tongue. No pharyngeal erythema or exudate.  Neck - supple. No JVD. Carotid pulses 2+ bilaterally. No carotid bruits. No palpable thyromegaly or lymphadenopathy. Cardiovascular - regular rate and rhythm. Normal S1 and S2. No murmurs, gallops or rubs.  Lungs - clear to auscultation bilaterally.  Abdomen - soft and nontender. Positive bowel sounds. No palpable organomegaly or masses.  Extremities - no pitting edema, clubbing or cyanosis.  Neuro - grossly non-focal. Skin - no rashes. Breast, pelvic and rectal - deferred.  Labs and radiographic studies were reviewed.  Assessment/plan: 1.  Shock: - The patient has not responded to IV fluid boluses and midodrine and therefore was started on IV Levophed. - For concern about septic shock she was started on broad-spectrum IV antibiotic therapy with IV cefepime, vancomycin and Flagyl. - She was continued on hydration with IV normal saline.  2.  Acute respiratory failure with hypoxia and associated acute encephalopathy. - Decision was made to immediately intubate her.  She was intubated by Dr. Ruthann Cancer.  She will be placed on mechanical ventilation transferred to the ICU under PCCM service.  Authorized and performed by: Eugenie Norrie, MD Total critical care time:  Approximately   30    minutes. Due to a high probability of clinically significant, life-threatening deterioration, the patient required my highest level of preparedness to intervene emergently and I personally spent this critical care time directly and personally managing the patient.  This critical care time included obtaining a history, examining the patient, pulse oximetry, ordering and review of studies, arranging urgent treatment with development of management plan, evaluation of patient's response to treatment, frequent reassessment, and discussions with other providers. This critical care time was performed to assess and manage the high probability of imminent, life-threatening deterioration that could result in multiorgan failure.  It was exclusive of separately billable procedures and treating other patients and teaching time.

## 2022-03-02 NOTE — Progress Notes (Signed)
  Echocardiogram 2D Echocardiogram has been performed.  [name] [name] 2022/02/25, 8:35 AM

## 2022-03-02 NOTE — Procedures (Signed)
Arterial Catheter Insertion Procedure Note  NAVIKA HOOPES  [id number]  1935/03/17  Date:17-Feb-2022  Time:2:11 AM    Provider Performing: Audria Nine    Procedure: Insertion of Arterial Line [phone]) with US guidance (51102)   Indication(s) Blood pressure monitoring and/or need for frequent ABGs  Consent Unable to obtain consent due to emergent nature of procedure.  Anesthesia None   Time Out Verified patient identification, verified procedure, site/side was marked, verified correct patient position, special equipment/implants available, medications/allergies/relevant history reviewed, required imaging and test results available.   Sterile Technique Maximal sterile technique including full sterile barrier drape, hand hygiene, sterile gown, sterile gloves, mask, hair covering, sterile ultrasound probe cover (if used).   Procedure Description Area of catheter insertion was cleaned with chlorhexidine and draped in sterile fashion. With real-time ultrasound guidance an arterial catheter was placed into the right radial artery.  Appropriate arterial tracings confirmed on monitor.     Complications/Tolerance None; patient tolerated the procedure well.   EBL Minimal   Specimen(s) None

## 2022-03-02 NOTE — Sepsis Progress Note (Signed)
Notified bedside nurse of need to draw repeat lactic acid. 

## 2022-03-02 NOTE — IPAL (Signed)
  Interdisciplinary Goals of Care Family Meeting   Date carried out: 02-14-2022  Location of the meeting: Conference room  Member's involved: Physician, Chaplain, and Family Member or next of kin  Durable Power of Attorney or Loss adjuster, chartered: patient's adult children [name] [name] and [name] [name]. Around 54 other family members present for conference.     Discussion: We discussed goals of care for [name] [name] .  Reviewed [name] [name]'s medical condition including worsening multi-organ failure and shock. Patient is awake and alert and able to endorse abdominal pain. Pain medication administration currently limited due to level of vasopressor support inducing hypotension. The family has talked with the surgical team this morning and decided against surgery for their mom. She is now showing worsening signs of bleeding and infection, likely perforation from bowel obstruction. Family would like to keep currently level of support until additional family members can arrive. No escalation of care. Plan is to transition to comfort measures once everyone can be here in the next few hours. Will plan for palliative extubation at that time. In the mean time will start pain control now with fentanyl gtt.   Code status: Full DNR with plans for CC in the next few hours.   Disposition: Continue current acute care  Time spent for the meeting: 25 minutes.    Spero Geralds, MD  14-Feb-2022, 8:30 AM

## 2022-03-02 NOTE — Death Summary Note (Signed)
DEATH SUMMARY   Patient Details  Name: [name] [name] MRN: [id number] DOB: Nov 09, 1934  Admission/Discharge Information   Admit Date:  Mar 03, 2022  Date of Death: Date of Death: 03-04-22  Time of Death: Time of Death: 11/03/1004  Length of Stay: 1  Referring Physician: Willey Blade, MD   Reason(s) for Hospitalization  Small Bowel obstruction  Diagnoses  Preliminary cause of death: small bowel obstruction Secondary Diagnoses (including complications and co-morbidities):  Principal Problem:   SBO (small bowel obstruction) ([place]) Active Problems:   Coronary artery disease   Type 2 diabetes mellitus with chronic kidney disease, without long-term current use of insulin ([place])   Dyslipidemia   GERD without esophagitis   Hypotension   Small bowel obstruction [facility])   [place] Hospital Course (including significant findings, care, treatment, and services provided and events leading to death)  [name] [name] was a 87 y.o. year old female with past medical history of cad, t2dm, htn, asthma and h/o cva presented to ed with acute on set abdominal pain around her umbilicus. Reportedly she went to breakfast with her family this am and had usual meal. 2 hours later she had diarrhea. No other family members report being ill. No fever/chills/ no vomiting. Ed evaluation found a sbo with transition point and surgery was called recommending medical management with ng at this time 2/2  antiplt medications. Pt was hemodynamically stable per TRH who was asked to admit pt.    Later in the evening pt bp began to fall but was fluid responsive. Lactate elevated. Her bp cont to fall but she was mentating per reports. Ultimately she required vasopressors to start and ccm was thus consulted.    Upon arrival to the ed for evaluation pt BP was systolic in 44'W on levo infusion. She was unresponsive and RN was calling code. CPR was started and ACLS . ROSC was achieved (see code documentation for complete details) pt is  requiring 2 vasopressors norepi at 100 and vaso at 0.04. She was admitted to the ICU where she continued to decline and have bloody OG output. Surgery was consulted and she was not felt to be a surgical candidate and had high chance of mortality regardless of procedure.  Discussed grave situation with family. They elected to make her comfort care and she passed away peacefully with family at bedside.      Pertinent Labs and Studies  Significant Diagnostic Studies DG Chest Portable 1 View  Result Date: 03-04-22 CLINICAL DATA:  Nasogastric tube placement EXAM: PORTABLE CHEST 1 VIEW COMPARISON:  Earlier today FINDINGS: Advanced enteric tube which loops in the stomach, side port seen at the level of the stomach. Right IJ line with tip at the upper cavoatrial junction. Endotracheal tube with tip at the lower clavicular heads. Artifact from EKG leads and defibrillator pads. Interstitial coarsening from emphysema. Cardiomegaly. There is no edema, consolidation, effusion, or pneumothorax. IMPRESSION: 1. Advanced enteric tube with side port now at the stomach. 2. Remaining hardware in stable position. 3. Stable aeration. Electronically Signed   By: Jorje Guild M.D.   On: March 04, 2022 04:14   DG Chest Portable 1 View  Result Date: 03/04/22 CLINICAL DATA:  Check gastric catheter and endotracheal tube placement EXAM: PORTABLE CHEST 1 VIEW COMPARISON:  12/07/2021 FINDINGS: Cardiac shadow is stable. Endotracheal tube is seen 4 cm above the carina in satisfactory position. Gastric catheter extends to the stomach although the proximal side port lies in the distal esophagus. This should be advanced deeper into the  stomach. Right jugular central line is noted at cavoatrial junction. No pneumothorax is seen. The lungs are well aerated without focal infiltrate or sizable effusion. Chronic mild fibrotic changes are again seen. IMPRESSION: Tubes and lines as described above. Gastric catheter should be advanced deeper  into the stomach. No acute abnormality noted.  Chronic fibrotic changes are seen. Electronically Signed   By: Inez Catalina M.D.   On: 02-16-2022 01:50   CT ABDOMEN PELVIS W CONTRAST  Result Date: 02/01/2022 CLINICAL DATA:  Left lower quadrant abdominal pain EXAM: CT ABDOMEN AND PELVIS WITH CONTRAST TECHNIQUE: Multidetector CT imaging of the abdomen and pelvis was performed using the standard protocol following bolus administration of intravenous contrast. RADIATION DOSE REDUCTION: This exam was performed according to the departmental dose-optimization program which includes automated exposure control, adjustment of the mA and/or kV according to patient size and/or use of iterative reconstruction technique. CONTRAST:  79mL OMNIPAQUE IOHEXOL 300 MG/ML  SOLN COMPARISON:  09/27/2008 FINDINGS: Lower chest: No acute abnormality.  Small hiatal hernia.  Emphysema. Hepatobiliary: No focal liver abnormality is seen. Status post cholecystectomy. Postoperative biliary ductal dilatation. Pancreas: Unremarkable. No pancreatic ductal dilatation or surrounding inflammatory changes. Spleen: Normal in size without significant abnormality. Adrenals/Urinary Tract: Adrenal glands are unremarkable. Kidneys are normal, without renal calculi, solid lesion, or hydronephrosis. Bladder is unremarkable. Stomach/Bowel: Stomach is within normal limits. Appendix appears normal. Multiple thickened, mildly distended loops of mid to distal small bowel in the low central abdomen and pelvis. There appears to be a tightly narrowed transition point in the central pelvis (series 2, image 57). Descending and sigmoid diverticulosis. Vascular/Lymphatic: Aortic atherosclerosis. No enlarged abdominal or pelvic lymph nodes. Reproductive: Status post hysterectomy. Other: No abdominal wall hernia or abnormality. Small volume ascites. Musculoskeletal: No acute or significant osseous findings. IMPRESSION: 1. Multiple thickened, mildly distended loops of mid to  distal small bowel in the low central abdomen and pelvis. There appears to be a tightly narrowed transition point in the central pelvis. Findings are consistent with small bowel obstruction. As the proximal small bowel is decompressed, this is concerning for a closed loop obstruction. 2. Small volume ascites likely reactive. 3. Descending and sigmoid diverticulosis without evidence of acute diverticulitis. 4. Cholecystectomy with postoperative biliary ductal dilatation. Aortic Atherosclerosis (ICD10-I70.0) and Emphysema (ICD10-J43.9). Electronically Signed   By: Delanna Ahmadi M.D.   On: 02/02/2022 18:34    Microbiology No results found for this or any previous visit (from the past 240 hour(s)).  Lab Basic Metabolic Panel: Recent Labs  Lab 02/22/2022 1657 02/24/2022 1705 02/12/2022 2052 02/09/2022 2356 Feb 16, 2022 0240 02/16/22 0510  NA 141 143  --  143 144 137  K 4.1 4.2  --  4.2 5.3* 5.3*  CL 111 112*  --  115* 115* 106  CO2 19*  --   --  15* 7* 9*  GLUCOSE 187* 191*  --  321* 457* 676*  BUN 35* 33*  --  38* 38* 40*  CREATININE 1.34* 1.40* 1.46* 1.78* 2.04* 2.17*  CALCIUM 9.2  --   --  7.4* 7.4* 6.8*   Liver Function Tests: Recent Labs  Lab 02/09/2022 1657 01/31/2022 2356 2022/02/16 0240  AST 13* 17 67*  ALT 9 8 61*  ALKPHOS 60 44 49  BILITOT 0.6 0.3 0.4  PROT 8.3* 5.6* 4.8*  ALBUMIN 3.9 2.6* 2.1*   Recent Labs  Lab 02/19/2022 1657  LIPASE 32   No results for input(s): "AMMONIA" in the last 168 hours. CBC: Recent Labs  Lab  02/24/2022 1657 02/26/2022 1705 02/10/2022 2052 02/02/2022 2356 2022/02/17 0240 02/17/22 0510  WBC 8.4  --  9.2 12.9* 17.1* 19.1*  NEUTROABS 6.3  --   --  9.3* 10.1*  --   HGB 12.9 14.6 11.8* 9.5* 7.8* 7.2*  HCT 41.4 43.0 39.0 31.9* 26.7* 25.1*  MCV 82.5  --  85.7 87.4 90.8 92.6  PLT 363  --  335 263 215 196   Cardiac Enzymes: No results for input(s): "CKTOTAL", "CKMB", "CKMBINDEX", "TROPONINI" in the last 168 hours. Sepsis Labs: Recent Labs  Lab 02/28/2022 2052  02/24/2022 2356 2022-02-17 0022 February 17, 2022 0240 02/17/22 0502 02/17/22 0510  WBC 9.2 12.9*  --  17.1*  --  19.1*  LATICACIDVEN  --   --  8.1*  --  >9.0*  --

## 2022-03-02 NOTE — Progress Notes (Signed)
Pt transported to ICU on VENT without complication.   

## 2022-03-02 NOTE — Procedures (Signed)
Intubation Procedure Note  [name] [name]  [id number]  06/23/1935  Date:2022/03/02  Time:2:12 AM   Provider Performing:Tashonna Descoteaux Ruthann Cancer    Procedure: Intubation (59292)  Indication(s) Respiratory Failure  Consent Unable to obtain consent due to emergent nature of procedure.   Anesthesia none   Time Out Verified patient identification, verified procedure, site/side was marked, verified correct patient position, special equipment/implants available, medications/allergies/relevant history reviewed, required imaging and test results available.   Sterile Technique Usual hand hygeine, masks, and gloves were used   Procedure Description Patient positioned in bed supine.  Sedation given as noted above.  Patient was intubated with endotracheal tube using Glidescope.  View was Grade 1 full glottis .  Number of attempts was 1.  Colorimetric CO2 detector was consistent with tracheal placement.   Complications/Tolerance None; patient tolerated the procedure well. Chest X-ray is ordered to verify placement.   EBL Minimal   Specimen(s) None

## 2022-03-02 NOTE — Progress Notes (Signed)
RT NOTE:  Pt extubated to comfort care measures per MD order and family request.

## 2022-03-02 NOTE — Progress Notes (Signed)
Pt with acute decompensation overnight.  Now intubated and distended, lactate elevated.  Concern for rupture or bowel ischemia.  Discussed situation with family.  Due to anticoagulation and co-morbidities, she would be high risk for surgery and outcome unlikely to change even with bowel resection.  Even if patient were to survive surgery, chances are high that her lifestyle would be very limited.  Family did not feel that this is what she would want and have decided against surgical intervention.  All questions answered to the best of my abilities.  Will sign off.    Rosario Adie, MD  [facility]

## 2022-03-02 NOTE — Progress Notes (Signed)
eLink Physician-Brief Progress Note Patient Name: [name] [name] DOB: [birth year] MRN: [id number]   Date of Service  07-Mar-2022  HPI/Events of Note  30 F hx of asthma, HTN, CVA, DM, initially presented with periumbilical abdominal pain, N/V. On work up found to have SBO wtht transition point.  NG decompression recommended by surgery as patient on clopidogrel. Progressive hypotension overnight and eventually coded.  eICU Interventions  Post code care Septic shock, GI source more likely on cefepime and vancomycin On propofol for sedation, titrate to RASS 0     Intervention Category Evaluation Type: New Patient Evaluation  Judd Lien 2022-03-07, 5:12 AM

## 2022-03-02 NOTE — Procedures (Signed)
Central Venous Catheter Insertion Procedure Note  ANUREET BRUINGTON  [id number]  1935/05/23  Date:Mar 02, 2022  Time:2:05 AM   Provider Performing:Devonn Giampietro [facility] Cancer   Procedure: Insertion of Non-tunneled Central Venous Catheter(36556) with US guidance (42706)   Indication(s) Medication administration  Consent Unable to obtain consent due to emergent nature of procedure.  Anesthesia Topical only with 1% lidocaine   Timeout Verified patient identification, verified procedure, site/side was marked, verified correct patient position, special equipment/implants available, medications/allergies/relevant history reviewed, required imaging and test results available.  Sterile Technique Maximal sterile technique including full sterile barrier drape, hand hygiene, sterile gown, sterile gloves, mask, hair covering, sterile ultrasound probe cover (if used).  Procedure Description Area of catheter insertion was cleaned with chlorhexidine and draped in sterile fashion.  With real-time ultrasound guidance a central venous catheter was placed into the right internal jugular vein. Nonpulsatile blood flow and easy flushing noted in all ports.  The catheter was sutured in place and sterile dressing applied.  Complications/Tolerance None; patient tolerated the procedure well. Chest X-ray is ordered to verify placement for internal jugular or subclavian cannulation.   Chest x-ray is not ordered for femoral cannulation.  EBL Minimal  Specimen(s) None   Line was done under u/s guidance. Easily compressible vein noted with neighboring pulsatile artery. Upon stick dark red non pulsatile blood was noted. Wire easily advanced. Wire was verified to be in easily compressible/non pulsatile vessel in both cross sectional and longitudinal views under ultrasound guidance. Skin was easily dilated and catheter advanced without issue. Wire was withdrawn from vessel. No air was aspirated thru entirety of the procedure.  Pt tolerated well. No complications were appreciated.

## 2022-03-02 NOTE — Progress Notes (Signed)
eLink Physician-Brief Progress Note Patient Name: [name] [name] DOB: October 11, 1934 MRN: [id number]   Date of Service  February 14, 2022  HPI/Events of Note  Notified of hypotension BP improved with turning off propofol and patient is starting to wake up  eICU Interventions  Continue to hold propofol May give Fentanyl prn as ordered Ordered 500 cc NS bolus     Intervention Category Major Interventions: Hypotension - evaluation and management  Judd Lien 14-Feb-2022, 6:40 AM

## 2022-03-02 DEATH — deceased

## 2022-05-06 LAB — ECHOCARDIOGRAM COMPLETE
Area-P 1/2: 4.96 cm2
Calc EF: 46.4 %
Height: 69 in
S' Lateral: 2.6 cm
Single Plane A2C EF: 44 %
Single Plane A4C EF: 45.1 %
Weight: 2560.86 oz

## 2022-05-10 ENCOUNTER — Ambulatory Visit: Payer: Medicare PPO | Admitting: Podiatry

## 2022-07-05 ENCOUNTER — Other Ambulatory Visit: Payer: Medicare HMO

## 2022-07-08 ENCOUNTER — Ambulatory Visit: Payer: Medicare HMO | Admitting: Internal Medicine
